# Patient Record
Sex: Male | Born: 1940 | Race: White | Hispanic: No | Marital: Married | Smoking: Former smoker
Health system: Southern US, Community
[De-identification: ages and names within clinical notes are randomized; demographics above are authoritative.]

## PROBLEM LIST (undated history)

## (undated) DIAGNOSIS — E039 Hypothyroidism, unspecified: Secondary | ICD-10-CM

## (undated) DIAGNOSIS — Z9289 Personal history of other medical treatment: Secondary | ICD-10-CM

## (undated) DIAGNOSIS — Z7189 Other specified counseling: Secondary | ICD-10-CM

## (undated) DIAGNOSIS — R0600 Dyspnea, unspecified: Secondary | ICD-10-CM

## (undated) DIAGNOSIS — I10 Essential (primary) hypertension: Secondary | ICD-10-CM

## (undated) DIAGNOSIS — E1140 Type 2 diabetes mellitus with diabetic neuropathy, unspecified: Secondary | ICD-10-CM

## (undated) DIAGNOSIS — G629 Polyneuropathy, unspecified: Secondary | ICD-10-CM

## (undated) DIAGNOSIS — C801 Malignant (primary) neoplasm, unspecified: Secondary | ICD-10-CM

## (undated) DIAGNOSIS — I739 Peripheral vascular disease, unspecified: Secondary | ICD-10-CM

## (undated) DIAGNOSIS — M1990 Unspecified osteoarthritis, unspecified site: Secondary | ICD-10-CM

## (undated) DIAGNOSIS — I878 Other specified disorders of veins: Secondary | ICD-10-CM

## (undated) HISTORY — PX: FINGER SURGERY: SHX640

## (undated) HISTORY — DX: Essential (primary) hypertension: I10

## (undated) HISTORY — DX: Personal history of other medical treatment: Z92.89

---
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
## 2008-02-03 ENCOUNTER — Emergency Department: Payer: Self-pay | Admitting: Emergency Medicine

## 2009-03-08 ENCOUNTER — Emergency Department: Payer: Self-pay | Admitting: Emergency Medicine

## 2009-04-10 DIAGNOSIS — Z9289 Personal history of other medical treatment: Secondary | ICD-10-CM

## 2009-04-10 HISTORY — DX: Personal history of other medical treatment: Z92.89

## 2009-05-08 ENCOUNTER — Ambulatory Visit: Payer: Self-pay | Admitting: Cardiovascular Disease

## 2011-05-11 ENCOUNTER — Inpatient Hospital Stay (HOSPITAL_COMMUNITY)
Admission: EM | Admit: 2011-05-11 | Discharge: 2011-05-14 | DRG: 200 | Disposition: A | Discharge status: Home / Self Care | Payer: Medicare PPO | Attending: General Surgery | Admitting: General Surgery

## 2011-05-11 ENCOUNTER — Emergency Department (HOSPITAL_COMMUNITY): Payer: Medicare PPO

## 2011-05-11 DIAGNOSIS — S21209A Unspecified open wound of unspecified back wall of thorax without penetration into thoracic cavity, initial encounter: Secondary | ICD-10-CM | POA: Diagnosis present

## 2011-05-11 DIAGNOSIS — Y92009 Unspecified place in unspecified non-institutional (private) residence as the place of occurrence of the external cause: Secondary | ICD-10-CM

## 2011-05-11 DIAGNOSIS — Z7982 Long term (current) use of aspirin: Secondary | ICD-10-CM

## 2011-05-11 DIAGNOSIS — F1010 Alcohol abuse, uncomplicated: Secondary | ICD-10-CM | POA: Diagnosis present

## 2011-05-11 DIAGNOSIS — Z79899 Other long term (current) drug therapy: Secondary | ICD-10-CM

## 2011-05-11 DIAGNOSIS — R339 Retention of urine, unspecified: Secondary | ICD-10-CM | POA: Diagnosis present

## 2011-05-11 DIAGNOSIS — F17200 Nicotine dependence, unspecified, uncomplicated: Secondary | ICD-10-CM | POA: Diagnosis present

## 2011-05-11 DIAGNOSIS — D62 Acute posthemorrhagic anemia: Secondary | ICD-10-CM | POA: Diagnosis not present

## 2011-05-11 DIAGNOSIS — E119 Type 2 diabetes mellitus without complications: Secondary | ICD-10-CM | POA: Diagnosis present

## 2011-05-11 DIAGNOSIS — S2190XA Unspecified open wound of unspecified part of thorax, initial encounter: Principal | ICD-10-CM | POA: Diagnosis present

## 2011-05-11 DIAGNOSIS — S270XXA Traumatic pneumothorax, initial encounter: Secondary | ICD-10-CM

## 2011-05-11 DIAGNOSIS — I10 Essential (primary) hypertension: Secondary | ICD-10-CM | POA: Diagnosis present

## 2011-05-11 DIAGNOSIS — E785 Hyperlipidemia, unspecified: Secondary | ICD-10-CM | POA: Diagnosis present

## 2011-05-11 LAB — CBC
HCT: 33.9 % — ABNORMAL LOW (ref 39.0–52.0)
MCH: 35.1 pg — ABNORMAL HIGH (ref 26.0–34.0)
MCHC: 36 g/dL (ref 30.0–36.0)
MCV: 97.4 fL (ref 78.0–100.0)
Platelets: 163 10*3/uL (ref 150–400)
RDW: 12.2 % (ref 11.5–15.5)

## 2011-05-11 LAB — POCT I-STAT, CHEM 8
BUN: 6 mg/dL (ref 6–23)
Calcium, Ion: 1.07 mmol/L — ABNORMAL LOW (ref 1.12–1.32)
Chloride: 101 meq/L (ref 96–112)
Glucose, Bld: 162 mg/dL — ABNORMAL HIGH (ref 70–99)
TCO2: 22 mmol/L (ref 0–100)

## 2011-05-11 LAB — GLUCOSE, CAPILLARY

## 2011-05-11 LAB — COMPREHENSIVE METABOLIC PANEL
ALT: 61 U/L — ABNORMAL HIGH (ref 0–53)
Alkaline Phosphatase: 63 U/L (ref 39–117)
CO2: 24 mEq/L (ref 19–32)
Chloride: 103 mEq/L (ref 96–112)
GFR calc Af Amer: >60 mL/min (ref >60)
Glucose, Bld: 161 mg/dL — ABNORMAL HIGH (ref 70–99)
Potassium: 3.7 mEq/L (ref 3.5–5.1)
Sodium: 137 mEq/L (ref 135–145)
Total Bilirubin: 0.2 mg/dL — ABNORMAL LOW (ref 0.3–1.2)
Total Protein: 5.8 g/dL — ABNORMAL LOW (ref 6.0–8.3)

## 2011-05-11 MED ORDER — IOHEXOL 300 MG/ML  SOLN
100.0000 mL | Freq: Once | INTRAMUSCULAR | Status: AC | PRN
  Administered 2011-05-11: 100 mL via INTRAVENOUS

## 2011-05-12 ENCOUNTER — Inpatient Hospital Stay (HOSPITAL_COMMUNITY): Payer: Medicare PPO

## 2011-05-12 LAB — BASIC METABOLIC PANEL
GFR calc Af Amer: >60 mL/min (ref >60)
GFR calc non Af Amer: >60 mL/min (ref >60)
Glucose, Bld: 153 mg/dL — ABNORMAL HIGH (ref 70–99)
Potassium: 4.3 mEq/L (ref 3.5–5.1)
Sodium: 134 mEq/L — ABNORMAL LOW (ref 135–145)

## 2011-05-12 LAB — CBC
Hemoglobin: 9.7 g/dL — ABNORMAL LOW (ref 13.0–17.0)
MCHC: 36.3 g/dL — ABNORMAL HIGH (ref 30.0–36.0)
RDW: 12.2 % (ref 11.5–15.5)
WBC: 5.7 10*3/uL (ref 4.0–10.5)

## 2011-05-12 LAB — GLUCOSE, CAPILLARY
Glucose-Capillary: 135 mg/dL — ABNORMAL HIGH (ref 70–99)
Glucose-Capillary: 195 mg/dL — ABNORMAL HIGH (ref 70–99)
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
## 2011-05-12 NOTE — Op Note (Signed)
  NAME:  AVREY, HYSER NO.:  [id number]  MEDICAL RECORD NO.:  [id number]  LOCATION:  3103                         FACILITY:  [facility]  PHYSICIAN:  Adolph Pollack, M.D.DATE OF BIRTH:  December 21, 1940  DATE OF PROCEDURE:  05/11/2011 DATE OF DISCHARGE:                              OPERATIVE REPORT   PREOPERATIVE DIAGNOSES: 1. Left hemopneumothorax. 2. 2.5-cm left back laceration.  POSTOPERATIVE DIAGNOSIS: 1. Left hemopneumothorax. 2. 2.5-cm left back laceration.  PROCEDURE: 1. Insertion of 32-French chest tube on the left side. 2. Closure of 2.5 cm back laceration.  SURGEON:  Adolph Pollack, MD.  ANESTHESIA:  Xylocaine local plus sedation.  TECHNIQUE:  The left chest area was sterilely prepped and draped.  The anterior axillary line inferior to the nipple.  Local anesthetic was infiltrated and a small incision was made.  Using the hemostat, blunt dissection performed in the subcutaneous tissues.  Using a Kelly clamp, I entered the sixth and seventh intercostal space over the rib and large amount of air was evacuated.  A 32-French chest tube was then placed at 10 cm and then anchored to the skin with 3-0 silk suture.  Vaseline gauze was wrapped around the wound and sterile dressing was applied.  It was hooked to suction with an air leak present.  He tolerated this procedure well.  Following this, the patient sat up in the bed and sterilely prepped out the area around the back laceration.  It was anesthetized with Xylocaine solution.  It was then closed with staples followed by a bulky dressing. Both these procedures were well tolerated without any apparent complications.     Adolph Pollack, M.D.     Kari Baars  D:  05/11/2011  T:  05/11/2011  Job:  [id number]  Electronically Signed by Avel Peace M.D. on 05/12/2011 05:21:04 PM

## 2011-05-13 ENCOUNTER — Inpatient Hospital Stay (HOSPITAL_COMMUNITY): Payer: Medicare PPO

## 2011-05-13 LAB — GLUCOSE, CAPILLARY
Glucose-Capillary: 156 mg/dL — ABNORMAL HIGH (ref 70–99)
Glucose-Capillary: 123 mg/dL — ABNORMAL HIGH (ref 70–99)

## 2011-05-13 LAB — CBC
Hemoglobin: 8.8 g/dL — ABNORMAL LOW (ref 13.0–17.0)
MCH: 35.6 pg — ABNORMAL HIGH (ref 26.0–34.0)
MCV: 100 fL (ref 78.0–100.0)
RBC: 2.47 MIL/uL — ABNORMAL LOW (ref 4.22–5.81)

## 2011-05-14 ENCOUNTER — Inpatient Hospital Stay (HOSPITAL_COMMUNITY): Payer: Medicare PPO

## 2011-05-14 LAB — GLUCOSE, CAPILLARY
Glucose-Capillary: 121 mg/dL — ABNORMAL HIGH (ref 70–99)
Glucose-Capillary: 131 mg/dL — ABNORMAL HIGH (ref 70–99)

## 2011-05-15 LAB — TYPE AND SCREEN
Antibody Screen: NEGATIVE
Unit division: 0
Unit division: 0
Unit division: 0
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
## 2011-05-20 ENCOUNTER — Encounter (INDEPENDENT_AMBULATORY_CARE_PROVIDER_SITE_OTHER): Payer: Self-pay

## 2011-05-20 ENCOUNTER — Ambulatory Visit (INDEPENDENT_AMBULATORY_CARE_PROVIDER_SITE_OTHER): Payer: Medicare PPO | Admitting: Orthopedic Surgery

## 2011-05-20 DIAGNOSIS — S21219A Laceration without foreign body of unspecified back wall of thorax without penetration into thoracic cavity, initial encounter: Secondary | ICD-10-CM

## 2011-05-20 DIAGNOSIS — S21209A Unspecified open wound of unspecified back wall of thorax without penetration into thoracic cavity, initial encounter: Secondary | ICD-10-CM

## 2011-05-20 DIAGNOSIS — S272XXA Traumatic hemopneumothorax, initial encounter: Secondary | ICD-10-CM

## 2011-05-20 MED ORDER — HYDROCODONE-ACETAMINOPHEN 5-325 MG PO TABS: 1.0000 | ORAL_TABLET | ORAL | Status: DC | PRN
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
## 2011-05-20 NOTE — Patient Instructions (Signed)
Wash wounds daily with soap and water in shower. Cover with dry dressing.

## 2013-01-12 ENCOUNTER — Emergency Department: Payer: Self-pay | Admitting: Emergency Medicine

## 2013-01-12 ENCOUNTER — Observation Stay: Payer: Self-pay | Admitting: Internal Medicine

## 2013-01-12 LAB — CBC
HGB: 12.7 g/dL — ABNORMAL LOW (ref 13.0–18.0)
MCH: 35.6 pg — ABNORMAL HIGH (ref 26.0–34.0)
RBC: 3.55 10*6/uL — ABNORMAL LOW (ref 4.40–5.90)
RDW: 13 % (ref 11.5–14.5)
WBC: 6.3 10*3/uL (ref 3.8–10.6)

## 2013-01-12 LAB — COMPREHENSIVE METABOLIC PANEL
Albumin: 3.7 g/dL (ref 3.4–5.0)
Chloride: 104 mmol/L (ref 98–107)
Creatinine: 0.75 mg/dL (ref 0.60–1.30)
EGFR (African American): >60
Glucose: 125 mg/dL — ABNORMAL HIGH (ref 65–99)
Potassium: 3.5 mmol/L (ref 3.5–5.1)
SGPT (ALT): 33 U/L (ref 12–78)
Total Protein: 6.3 g/dL — ABNORMAL LOW (ref 6.4–8.2)

## 2013-01-12 LAB — CK TOTAL AND CKMB (NOT AT ARMC)
CK, Total: 646 U/L — ABNORMAL HIGH (ref 35–232)
CK, Total: 443 U/L — ABNORMAL HIGH (ref 35–232)
CK-MB: 10.2 ng/mL — ABNORMAL HIGH (ref 0.5–3.6)
CK-MB: 6.4 ng/mL — ABNORMAL HIGH (ref 0.5–3.6)

## 2013-01-12 LAB — TROPONIN I: Troponin-I: <0.02 ng/mL

## 2013-01-13 DIAGNOSIS — I200 Unstable angina: Secondary | ICD-10-CM

## 2013-01-13 LAB — CK TOTAL AND CKMB (NOT AT ARMC)
CK, Total: 325 U/L — ABNORMAL HIGH (ref 35–232)
CK-MB: 4.1 ng/mL — ABNORMAL HIGH (ref 0.5–3.6)

## 2013-01-13 LAB — LIPID PANEL
HDL Cholesterol: 29 mg/dL — ABNORMAL LOW (ref 40–60)
Triglycerides: 212 mg/dL — ABNORMAL HIGH (ref 0–200)

## 2013-01-13 LAB — CBC WITH DIFFERENTIAL/PLATELET
Basophil #: 0 10*3/uL (ref 0.0–0.1)
Basophil %: 0.5 %
Eosinophil #: 0 10*3/uL (ref 0.0–0.7)
Eosinophil %: 0.5 %
Lymphocyte #: 1.1 10*3/uL (ref 1.0–3.6)
MCHC: 35.1 g/dL (ref 32.0–36.0)
Neutrophil #: 2 10*3/uL (ref 1.4–6.5)
Platelet: 139 10*3/uL — ABNORMAL LOW (ref 150–440)
WBC: 3.5 10*3/uL — ABNORMAL LOW (ref 3.8–10.6)

## 2013-01-13 LAB — BASIC METABOLIC PANEL
Anion Gap: 6 — ABNORMAL LOW (ref 7–16)
BUN: 7 mg/dL (ref 7–18)
Co2: 29 mmol/L (ref 21–32)
EGFR (African American): >60
Osmolality: 280 (ref 275–301)
Potassium: 3.8 mmol/L (ref 3.5–5.1)
Sodium: 141 mmol/L (ref 136–145)

## 2013-01-15 LAB — STOOL CULTURE

## 2014-05-01 ENCOUNTER — Emergency Department: Payer: Self-pay | Admitting: Emergency Medicine

## 2014-05-03 ENCOUNTER — Emergency Department: Payer: Self-pay | Admitting: Emergency Medicine

## 2014-05-06 LAB — WOUND CULTURE

## 2015-01-31 NOTE — H&P (Signed)
PATIENT NAME:  YEE, JOSS MR#:  [id number] DATE OF BIRTH:  Jan 17, 1941  DATE OF ADMISSION:  01/12/2013  PRIMARY CARE PHYSICIAN: None.  REFERRING EMERGENCY ROOM PHYSICIAN: Orlie Dakin, MD  CHIEF COMPLAINT: Chest pain and abdominal pain.   HISTORY OF PRESENT ILLNESS: This is a 74 year old male with not known past medical history and not following with any doctor due to insurance issues. Not taking any medication at home. Started having some mild abdominal pain and diarrhea for the last 3 to 4 days. He said that the stool is watery and almost 5 to 6 times a day. No nausea or vomiting. Able to tolerate his diet still. He also has some acid reflux problem with this. Now for the last 1 to 2 days, he started having complaint of retrosternal chest pain, which is pressure-like and dull, on and off and not correlated with activities. He is denying any feeling of palpitations, dizziness, shortness of breath, cough or fever.  REVIEW OF SYSTEMS:  CONSTITUTIONAL: No fever, fatigue, weakness, pain or weight loss.  EYES: No blurring, double vision.  ENT: No tinnitus, hearing loss or ear pain.  RESPIRATORY: No cough, wheezing, hemoptysis.  CARDIOVASCULAR: No chest pain, orthopnea, edema or arrhythmia.  GASTROINTESTINAL: Has some diarrhea and mild abdominal pain.  GENITOURINARY: Denies any dysuria, hematuria or increased frequency.  ENDOCRINE: Denies heat or cold intolerance or increased urination.  MUSCULOSKELETAL: No pain or swelling in the joints.  NEUROLOGICAL: Denies any numbness, weakness, tremors or headache.  PSYCHIATRIC: Denies any anxiety, insomnia or depression.   PAST MEDICAL HISTORY: Not known, but as per the medical records available in the system, in 2010 cardiac cath was done and it was positive for mild triple-vessel disease. Advised medical management. Was not taking any medicine.   PAST SURGICAL HISTORY: None.   HOME MEDICATIONS: None.   SOCIAL HISTORY: He stopped smoking 1 month  ago. He was smoking for almost 50 to 60 years 2 to 3 packs every week. Social drinking habit and no drug use. He was a Retail buyer and retired 9 years ago.   FAMILY HISTORY: Mother had myocardial infarction at old age.   PHYSICAL EXAMINATION:  VITAL SIGNS: Temperature 97.8, pulse rate 69, respiratory 18, blood pressure 173/71, pulse ox 98 on room air.  GENERAL: He is fully alert and oriented to time, place and person.  HEENT: Head and neck atraumatic. Conjunctivae pink. Oral mucosa moist.  NECK: Supple. No JVD.  RESPIRATORY: Bilateral clear and equal air entry.  CARDIOVASCULAR: S1, S2 present, regular. No murmur.  ABDOMEN: Soft, mild tenderness present all over, bowel sounds normal  SKIN: No rashes.  EXTREMITIES: Legs: No edema.  NEUROLOGICAL: Power 5/5. Moves all 4 limbs. No tremor. Follows commands.  PSYCHIATRIC: Does not appear in any acute psychiatric illness at this time.   LABORATORY AND DIAGNOSTIC DATA: Glucose 125, BUN 6, creatinine 0.75, sodium 137, potassium 3.5, chloride 104, CO2 of 28, calcium 7.7, total protein 6.3, albumin 3.7, alkaline phosphate 89, SGOT 36, SGPT 33. CK total 646, CK-MB 10.2 and troponin less than 0.02. WBC 6.3, hemoglobin 12.7, platelet count 161, MCV 102. EKG: Some conduction delay.   ASSESSMENT AND PLAN: A 74 year old male with past cardiac catheterization showing mild coronary artery disease, triple-vessel, not following with any doctor, not taking any medicines for anything. Came with mild diarrhea and abdominal pain for the last 3 to 4 days, but started having chest pain for 1 to 2 days on and off.  1.  Chest pain: As  he has a history of coronary artery disease and he was noncompliant with medications, we will do troponins x 3 and do echocardiogram and will start him on aspirin for now. Monitor on telemetry.  2.  Uncontrolled hypertension: We will give him metoprolol and continue monitoring.  3.  Coronary artery disease as per the chart in the past: We will  give aspirin. Troponins x 3, echocardiogram, and will check his lipid panel.  4.  Diarrhea and mild abdominal pain: Stool for culture. WBC is normal. No fever. Possibly this is viral gastroenteritis. No IV fluids due to hypertension. Will give supportive care with Zofran and Nexium.   TOTAL CARE TIME SPENT: 50 minutes.   CODE STATUS: Full code.   ____________________________ Ceasar Lund Anselm Jungling, MD vgv:jm D: 01/12/2013 15:09:03 ET T: 01/12/2013 15:21:39 ET JOB#: [id number]  cc: Ceasar Lund. Anselm Jungling, MD, <Dictator> Vaughan Basta MD ELECTRONICALLY SIGNED 01/21/2013 22:12

## 2015-01-31 NOTE — Discharge Summary (Signed)
PATIENT NAME:  [name], [name] MR#:  [id number] DATE OF BIRTH:  1940-12-04  DATE OF ADMISSION:  01/12/2013 DATE OF DISCHARGE:  01/13/2013  ADMISSION DIAGNOSES: 1.  Chest pain.  2.  Elevated blood pressure.   DISCHARGE DIAGNOSES: 1.  Chest pain.  2.  Elevated blood pressure; no formal diagnosis of hypertension.  3.  Diarrhea.   CONSULTS: None.   PERTINENT IMAGING: The patient had a 2-D echocardiogram which showed an EF of 55% to 60%; unable to exclude wall motion abnormality, impaired relaxation pattern of LV diastolic filling.   Troponins were negative. White blood cells 3.5, hemoglobin 12.6, hematocrit 36, platelets are 139. Sodium 141, potassium 3.8, chloride 106, bicarbonate 29, BUN 7, creatinine 0.87, glucose 106, magnesium 2.0, LDL 36, VLDL 42, HDL 29, triglycerides 212, cholesterol 107.   HOSPITAL COURSE: This is a 74 year old male who presented with chest pain and elevated blood pressure. For further details, please refer to the H and P.   1.  Chest pain: The patient was admitted to telemetry. He was ruled out with cardiac enzymes. He had no chest pain while in the hospital. He had an echocardiogram which was performed, which showed results above. His troponins, as mentioned, were negative. If the patient has chest pain again he was told to come to the ER. At that time he will need further evaluation. At this time I feel that his chest pain may be related to his elevated blood pressure.  2.  Elevated blood pressure: No formal diagnosis of hypertension. He had improved with metoprolol that he was started on here in the hospital. He will need a followup for his blood pressure. His echocardiogram did show some impaired LV relaxation which probably goes along with his elevated blood pressure.  3.  Diarrhea, which seemed to have improved.   DISCHARGE MEDICATIONS: 1.  Aspirin 81 mg daily.  2.  Metoprolol 25 b.i.d.   DISCHARGE DIET: Low sodium.   DISCHARGE ACTIVITY: As tolerated.    The patient's sister will help the patient find a primary care physician to continue his medical care and followup on blood pressure.   TIME SPENT: Approximately 35 minutes.    ____________________________ Donell Beers. Benjie Karvonen, MD spm:dm D: 01/13/2013 15:30:00 ET T: 01/14/2013 08:58:42 ET JOB#: [id number]  cc: Ural Acree P. Benjie Karvonen, MD, <Dictator> Donell Beers Lyzette Reinhardt MD ELECTRONICALLY SIGNED 01/14/2013 13:26

## 2015-03-03 DIAGNOSIS — E119 Type 2 diabetes mellitus without complications: Secondary | ICD-10-CM | POA: Diagnosis not present

## 2015-03-03 DIAGNOSIS — I10 Essential (primary) hypertension: Secondary | ICD-10-CM | POA: Diagnosis not present

## 2015-03-03 DIAGNOSIS — R079 Chest pain, unspecified: Secondary | ICD-10-CM | POA: Diagnosis not present

## 2015-03-03 DIAGNOSIS — Z125 Encounter for screening for malignant neoplasm of prostate: Secondary | ICD-10-CM | POA: Diagnosis not present

## 2015-03-03 DIAGNOSIS — E785 Hyperlipidemia, unspecified: Secondary | ICD-10-CM | POA: Diagnosis not present

## 2015-03-03 LAB — BASIC METABOLIC PANEL
BUN: 8 mg/dL (ref 4–21)
Creatinine: 0.8 mg/dL (ref 0.6–1.3)
GLUCOSE: 124 mg/dL
Potassium: 4.6 mmol/L (ref 3.4–5.3)
SODIUM: 135 mmol/L — AB (ref 137–147)

## 2015-03-03 LAB — CBC AND DIFFERENTIAL
HCT: 36 % — AB (ref 41–53)
Hemoglobin: 12.3 g/dL — AB (ref 13.5–17.5)
Platelets: 145 10*3/uL — AB (ref 150–399)
WBC: 3 10*3/mL

## 2015-03-03 LAB — HM DIABETES FOOT EXAM: HM Diabetic Foot Exam: NORMAL

## 2015-03-03 LAB — HEPATIC FUNCTION PANEL
ALK PHOS: 80 U/L (ref 25–125)
ALT: 68 U/L — AB (ref 10–40)
AST: 49 U/L — AB (ref 14–40)
Bilirubin, Total: 0.4 mg/dL

## 2015-03-03 LAB — LIPID PANEL
Cholesterol: 158 mg/dL (ref 0–200)
HDL: 28 mg/dL — AB (ref 35–70)
LDL CALC: 54 mg/dL
Triglycerides: 380 mg/dL — AB (ref 40–160)

## 2015-03-03 LAB — HEMOGLOBIN A1C: Hgb A1c MFr Bld: 7.4 % — AB (ref 4.0–6.0)

## 2015-03-03 LAB — PSA: PSA: 1.4

## 2015-03-03 LAB — TSH: TSH: 10.05 u[IU]/mL — AB (ref 0.41–5.90)

## 2015-04-07 ENCOUNTER — Ambulatory Visit (INDEPENDENT_AMBULATORY_CARE_PROVIDER_SITE_OTHER): Payer: Medicare Other | Admitting: Family Medicine

## 2015-04-07 ENCOUNTER — Encounter: Payer: Self-pay | Admitting: Family Medicine

## 2015-04-07 VITALS — BP 132/70 | HR 74 | Temp 98.1°F | Ht 67.5 in | Wt 189.8 lb

## 2015-04-07 DIAGNOSIS — I129 Hypertensive chronic kidney disease with stage 1 through stage 4 chronic kidney disease, or unspecified chronic kidney disease: Secondary | ICD-10-CM | POA: Diagnosis not present

## 2015-04-07 DIAGNOSIS — E1140 Type 2 diabetes mellitus with diabetic neuropathy, unspecified: Secondary | ICD-10-CM | POA: Diagnosis not present

## 2015-04-07 DIAGNOSIS — N181 Chronic kidney disease, stage 1: Secondary | ICD-10-CM | POA: Diagnosis not present

## 2015-04-07 DIAGNOSIS — D61818 Other pancytopenia: Secondary | ICD-10-CM

## 2015-04-07 DIAGNOSIS — R946 Abnormal results of thyroid function studies: Secondary | ICD-10-CM

## 2015-04-07 DIAGNOSIS — D72819 Decreased white blood cell count, unspecified: Secondary | ICD-10-CM

## 2015-04-07 DIAGNOSIS — IMO0002 Reserved for concepts with insufficient information to code with codable children: Secondary | ICD-10-CM

## 2015-04-07 DIAGNOSIS — E1122 Type 2 diabetes mellitus with diabetic chronic kidney disease: Secondary | ICD-10-CM | POA: Diagnosis not present

## 2015-04-07 DIAGNOSIS — R7989 Other specified abnormal findings of blood chemistry: Secondary | ICD-10-CM

## 2015-04-07 DIAGNOSIS — Z7689 Persons encountering health services in other specified circumstances: Secondary | ICD-10-CM | POA: Diagnosis not present

## 2015-04-07 DIAGNOSIS — E1165 Type 2 diabetes mellitus with hyperglycemia: Secondary | ICD-10-CM | POA: Diagnosis not present

## 2015-04-07 HISTORY — DX: Type 2 diabetes mellitus with diabetic neuropathy, unspecified: E11.40

## 2015-04-07 MED ORDER — METFORMIN HCL ER (MOD) 1000 MG PO TB24: 1000.0000 mg | ORAL_TABLET | Freq: Every day | ORAL | Status: DC

## 2015-04-07 MED ORDER — LISINOPRIL 5 MG PO TABS: 5.0000 mg | ORAL_TABLET | Freq: Every day | ORAL | Status: DC

## 2015-04-07 MED ORDER — LISINOPRIL 5 MG PO TABS
5.0000 mg | ORAL_TABLET | Freq: Every day | ORAL | Status: DC
Start: 2015-04-07 — End: 2015-04-07

## 2015-04-07 NOTE — Assessment & Plan Note (Signed)
Of unclear etiology. Had abdominal pain last visit, so possibly post-infectious. Rechecking today. If continues to be low, will refer to hematology.

## 2015-04-07 NOTE — Assessment & Plan Note (Signed)
Rechecking labs today. Await results.  

## 2015-04-07 NOTE — Assessment & Plan Note (Addendum)
Not under great control at this time, but better on recheck. Will start lisinopril 5mg  because of labile BP and CKD. Will recheck 1 month with BMP at that time.

## 2015-04-07 NOTE — Progress Notes (Signed)
BP 132/70 mmHg  Pulse 74  Temp(Src) 98.1 F (36.7 C) (Oral)  Ht 5' 7.5" (1.715 m)  Wt 189 lb 12.8 oz (86.093 kg)  BMI 29.27 kg/m2  SpO2 96%   Subjective:    Patient ID: [name], male    DOB: [birth year], 73 y.o.   MRN: [id number]  HPI: [name] is a 74 y.o. male who presents today for follow up on his diabetes, blood pressure and labwork.   Chief Complaint  Patient presents with  . Follow-up   HYPOTHYROIDISM- has never had a problem with his thyroid before. Has never been on thyroid medicine before. Does note several symptoms.  Thyroid control status:uncontrolled Satisfied with current treatment? no Medication side effects: no Fatigue: yes Cold intolerance: no Heat intolerance: no Weight gain: yes Weight loss: no Constipation: no Diarrhea/loose stools: yes Palpitations: no Lower extremity edema: no Anxiety/depressed mood: no   HYPERTENSION Hypertension status: stable Satisfied with current treatment? no Duration of hypertension: chronic BP monitoring frequency:  not checking Previous BP meds:lisinopril Aspirin: no Recurrent headaches: no Visual changes: no Palpitations: no Dyspnea: no Chest pain: no Lower extremity edema: no Dizzy/lightheaded: no   DIABETES Hypoglycemic episodes:no Polydipsia/polyuria: no Visual disturbance: no Chest pain: no Paresthesias: no Glucose Monitoring: no Blood Pressure Monitoring: not checking Retinal Examination: Not up to Date Foot Exam: Up to Date Diabetic Education: Not Completed Pneumovax: Up to Date Influenza: unknown Aspirin: no   Low WBC- has never been diagnosed with this before. Feeling fine.   His abdominal pain has resolved. He is otherwise feeling well. He missed his appointment for podiatry and still notes that his toenails are bothering him a lot. He otherwise has no other concerns or complaints at this time.   Relevant past medical, surgical, family and social history reviewed and updated as  indicated. Interim medical history since our last visit reviewed. Allergies and medications reviewed and updated.  Review of Systems  Constitutional: Negative.   Respiratory: Negative.   Cardiovascular: Negative.   Gastrointestinal: Negative.   Psychiatric/Behavioral: Negative.    Per HPI unless specifically indicated above    Objective:    BP 132/70 mmHg  Pulse 74  Temp(Src) 98.1 F (36.7 C) (Oral)  Ht 5' 7.5" (1.715 m)  Wt 189 lb 12.8 oz (86.093 kg)  BMI 29.27 kg/m2  SpO2 96%  Wt Readings from Last 3 Encounters:  04/07/15 189 lb 12.8 oz (86.093 kg)  03/03/15 190 lb (86.183 kg)    Physical Exam  Constitutional: He is oriented to person, place, and time. He appears well-developed and well-nourished. No distress.  HENT:  Head: Normocephalic and atraumatic.  Right Ear: Hearing normal.  Left Ear: Hearing normal.  Nose: Nose normal.  Eyes: Conjunctivae and lids are normal. Right eye exhibits no discharge. Left eye exhibits no discharge. No scleral icterus.  Cardiovascular: Normal rate, regular rhythm, normal heart sounds and intact distal pulses.  Exam reveals no gallop and no friction rub.   No murmur heard. Pulmonary/Chest: Effort normal and breath sounds normal. No respiratory distress.  Musculoskeletal: Normal range of motion.  Neurological: He is alert and oriented to person, place, and time.  Skin: Skin is warm, dry and intact. No rash noted.  Psychiatric: He has a normal mood and affect. His speech is normal and behavior is normal. Judgment and thought content normal. Cognition and memory are normal.  DM Foot Exam Color: normal, overgrown toenails Sensation Monofilament:diminished right and left heel and ball, everything except toes Circulation:  Pulses normal right and left pedal and posterial tibial  Lesions:  None     Assessment & Plan:   Problem List Items Addressed This Visit      Endocrine   Type 2 diabetes, uncontrolled, with neuropathy    A1c 7.4 at  last check. Will restart his metformin and recheck in 3 months. Continue diet and exercise. Education about neuropathy and checking his feet given today. No pain, so no need for neuroleptic at this time. Appointment for podiatry remade and referral for opthalmology written today.       Relevant Medications   lisinopril (PRINIVIL,ZESTRIL) 5 MG tablet   metFORMIN (GLUMETZA) 1000 MG (MOD) 24 hr tablet   Other Relevant Orders   Ambulatory referral to Ophthalmology     Genitourinary   Benign hypertensive renal disease - Primary    Not under great control at this time, but better on recheck. Will start lisinopril 5mg  because of labile BP and CKD. Will recheck 1 month with BMP at that time.       Relevant Medications   lisinopril (PRINIVIL,ZESTRIL) 5 MG tablet   CKD stage 1 due to type 2 diabetes mellitus    Started on lisinopril today. Continue to monitor.       Relevant Medications   lisinopril (PRINIVIL,ZESTRIL) 5 MG tablet   metFORMIN (GLUMETZA) 1000 MG (MOD) 24 hr tablet     Hematopoietic and Hemostatic   Pancytopenia    Rechecking labs today. Await results.         Other   Leukopenia    Of unclear etiology. Had abdominal pain last visit, so possibly post-infectious. Rechecking today. If continues to be low, will refer to hematology.       Relevant Orders   CBC With Differential/Platelet   Elevated TSH    Elevated last visit. Will recheck today, if still elevated, we will start him on levothyroxine and recheck in 6 weeks.       Relevant Orders   TSH       Follow up plan: Return in about 4 weeks (around 05/05/2015).

## 2015-04-07 NOTE — Assessment & Plan Note (Signed)
Elevated last visit. Will recheck today, if still elevated, we will start him on levothyroxine and recheck in 6 weeks.

## 2015-04-07 NOTE — Assessment & Plan Note (Signed)
A1c 7.4 at last check. Will restart his metformin and recheck in 3 months. Continue diet and exercise. Education about neuropathy and checking his feet given today. No pain, so no need for neuroleptic at this time. Appointment for podiatry remade and referral for opthalmology written today.

## 2015-04-07 NOTE — Assessment & Plan Note (Signed)
Started on lisinopril today. Continue to monitor.

## 2015-04-07 NOTE — Patient Instructions (Addendum)
You have an appointment with  [facility]  [place], [place] [address] [phone]  on 04/22/15 at [facility]. Be there at 1:45PM   Diabetic Neuropathy Diabetic neuropathy is a nerve disease or nerve damage that is caused by diabetes mellitus. About half of all people with diabetes mellitus have some form of nerve damage. Nerve damage is more common in those who have had diabetes mellitus for many years and who generally have not had good control of their blood sugar (glucose) level. Diabetic neuropathy is a common complication of diabetes mellitus. There are three more common types of diabetic neuropathy and a fourth type that is less common and less understood:   Peripheral neuropathy--This is the most common type of diabetic neuropathy. It causes damage to the nerves of the feet and legs first and then eventually the hands and arms.The damage affects the ability to sense touch.  Autonomic neuropathy--This type causes damage to the autonomic nervous system, which controls the following functions:  Heartbeat.  Body temperature.  Blood pressure.  Urination.  Digestion.  Sweating.  Sexual function.  Focal neuropathy--Focal neuropathy can be painful and unpredictable and occurs most often in older adults with diabetes mellitus. It involves a specific nerve or one area and often comes on suddenly. It usually does not cause long-term problems.  Radiculoplexus neuropathy-- Sometimes called lumbosacral radiculoplexus neuropathy, radiculoplexus neuropathy affects the nerves of the thighs, hips, buttocks, or legs. It is more common in people with type 2 diabetes mellitus and in older men. It is characterized by debilitating pain, weakness, and atrophy, usually in the thigh muscles. CAUSES  The cause of peripheral, autonomic, and focal neuropathies is diabetes mellitus that is uncontrolled and high glucose levels. The cause of radiculoplexus neuropathy is unknown.  However, it is thought to be caused by inflammation related to uncontrolled glucose levels. SIGNS AND SYMPTOMS  Peripheral Neuropathy Peripheral neuropathy develops slowly over time. When the nerves of the feet and legs no longer work there may be:   Burning, stabbing, or aching pain in the legs or feet.  Inability to feel pressure or pain in your feet. This can lead to:  Thick calluses over pressure areas.  Pressure sores.  Ulcers.  Foot deformities.  Reduced ability to feel temperature changes.  Muscle weakness. Autonomic Neuropathy The symptoms of autonomic neuropathy vary depending on which nerves are affected. Symptoms may include:  Problems with digestion, such as:  Feeling sick to your stomach (nausea).  Vomiting.  Bloating.  Constipation.  Diarrhea.  Abdominal pain.  Difficulty with urination. This occurs if you lose your ability to sense when your bladder is full. Problems include:  Urine leakage (incontinence).  Inability to empty your bladder completely (retention).  Rapid or irregular heartbeat (palpitations).  Blood pressure drops when you stand up (orthostatic hypotension). When you stand up you may feel:  Dizzy.  Weak.  Faint.  In men, inability to attain and maintain an erection.  In women, vaginal dryness and problems with decreased sexual desire and arousal.  Problems with body temperature regulation.  Increased or decreased sweating. Focal Neuropathy  Abnormal eye movements or abnormal alignment of both eyes.  Weakness in the wrist.  Foot drop. This results in an inability to lift the foot properly and abnormal walking or foot movement.  Paralysis on one side of your face (Bell palsy).  Chest or abdominal pain. Radiculoplexus Neuropathy  Sudden, severe pain in your hip, thigh, or buttocks.  Weakness and wasting of  thigh muscles.  Difficulty rising from a seated position.  Abdominal swelling.  Unexplained weight loss  (usually more than 10 lb [4.5 kg]). DIAGNOSIS  Peripheral Neuropathy Your senses may be tested. Sensory function testing can be done with:  A light touch using a monofilament.  A vibration with tuning fork.  A sharp sensation with a pin prick. Other tests that can help diagnose neuropathy are:  Nerve conduction velocity. This test checks the transmission of an electrical current through a nerve.  Electromyography. This shows how muscles respond to electrical signals transmitted by nearby nerves.  Quantitative sensory testing. This is used to assess how your nerves respond to vibrations and changes in temperature. Autonomic Neuropathy Diagnosis is often based on reported symptoms. Tell your health care provider if you experience:   Dizziness.   Constipation.   Diarrhea.   Inappropriate urination or inability to urinate.   Inability to get or maintain an erection.  Tests that may be done include:   Electrocardiography or Holter monitor. These are tests that can help show problems with the heart rate or heart rhythm.   An X-ray exam may be done. Focal Neuropathy Diagnosis is made based on your symptoms and what your health care provider finds during your exam. Other tests may be done. They may include:  Nerve conduction velocities. This checks the transmission of electrical current through a nerve.  Electromyography. This shows how muscles respond to electrical signals transmitted by nearby nerves.  Quantitative sensory testing. This test is used to assess how your nerves respond to vibration and changes in temperature. Radiculoplexus Neuropathy  Often the first thing is to eliminate any other issue or problems that might be the cause, as there is no stick test for diagnosis.  X-ray exam of your spine and lumbar region.  Spinal tap to rule out cancer.  MRI to rule out other lesions. TREATMENT  Once nerve damage occurs, it cannot be reversed. The goal of  treatment is to keep the disease or nerve damage from getting worse and affecting more nerve fibers. Controlling your blood glucose level is the key. Most people with radiculoplexus neuropathy see at least a partial improvement over time. You will need to keep your blood glucose and HbA1c levels in the target range determined by your health care provider. Things that help control blood glucose levels include:   Blood glucose monitoring.   Meal planning.   Physical activity.   Diabetes medicine.  Over time, maintaining lower blood glucose levels helps lessen symptoms. Sometimes, prescription pain medicine is needed. HOME CARE INSTRUCTIONS:  Do not smoke.  Keep your blood glucose level in the range that you and your health care provider have determined acceptable for you.  Keep your blood pressure level in the range that you and your health care provider have determined acceptable for you.  Eat a well-balanced diet.  Be active every day.  Check your feet every day. SEEK MEDICAL CARE IF:   You have burning, stabbing, or aching pain in the legs or feet.  You are unable to feel pressure or pain in your feet.  You develop problems with digestion such as:  Nausea.  Vomiting.  Bloating.  Constipation.  Diarrhea.  Abdominal pain.  You have difficulty with urination, such as:  Incontinence.  Retention.  You have palpitations.  You develop orthostatic hypotension. When you stand up you may feel:  Dizzy.  Weak.  Faint.  You cannot attain and maintain an erection (in men).  You  have vaginal dryness and problems with decreased sexual desire and arousal (in women).  You have severe pain in your thighs, legs, or buttocks.  You have unexplained weight loss. Document Released: 12/06/2001 Document Revised: 07/18/2013 Document Reviewed: 03/08/2013 [facility] Patient Information 2015 [place], [place]. This information is not intended to replace advice given to you by  your health care provider. Make sure you discuss any questions you have with your health care provider. Diabetes and Foot Care Diabetes may cause you to have problems because of poor blood supply (circulation) to your feet and legs. This may cause the skin on your feet to become thinner, break easier, and heal more slowly. Your skin may become dry, and the skin may peel and crack. You may also have nerve damage in your legs and feet causing decreased feeling in them. You may not notice minor injuries to your feet that could lead to infections or more serious problems. Taking care of your feet is one of the most important things you can do for yourself.  HOME CARE INSTRUCTIONS  Wear shoes at all times, even in the house. Do not go barefoot. Bare feet are easily injured.  Check your feet daily for blisters, cuts, and redness. If you cannot see the bottom of your feet, use a mirror or ask someone for help.  Wash your feet with warm water (do not use hot water) and mild soap. Then pat your feet and the areas between your toes until they are completely dry. Do not soak your feet as this can dry your skin.  Apply a moisturizing lotion or petroleum jelly (that does not contain alcohol and is unscented) to the skin on your feet and to dry, brittle toenails. Do not apply lotion between your toes.  Trim your toenails straight across. Do not dig under them or around the cuticle. File the edges of your nails with an emery board or nail file.  Do not cut corns or calluses or try to remove them with medicine.  Wear clean socks or stockings every day. Make sure they are not too tight. Do not wear knee-high stockings since they may decrease blood flow to your legs.  Wear shoes that fit properly and have enough cushioning. To break in new shoes, wear them for just a few hours a day. This prevents you from injuring your feet. Always look in your shoes before you put them on to be sure there are no objects  inside.  Do not cross your legs. This may decrease the blood flow to your feet.  If you find a minor scrape, cut, or break in the skin on your feet, keep it and the skin around it clean and dry. These areas may be cleansed with mild soap and water. Do not cleanse the area with peroxide, alcohol, or iodine.  When you remove an adhesive bandage, be sure not to damage the skin around it.  If you have a wound, look at it several times a day to make sure it is healing.  Do not use heating pads or hot water bottles. They may burn your skin. If you have lost feeling in your feet or legs, you may not know it is happening until it is too late.  Make sure your health care provider performs a complete foot exam at least annually or more often if you have foot problems. Report any cuts, sores, or bruises to your health care provider immediately. SEEK MEDICAL CARE IF:   You have an  injury that is not healing.  You have cuts or breaks in the skin.  You have an ingrown nail.  You notice redness on your legs or feet.  You feel burning or tingling in your legs or feet.  You have pain or cramps in your legs and feet.  Your legs or feet are numb.  Your feet always feel cold. SEEK IMMEDIATE MEDICAL CARE IF:   There is increasing redness, swelling, or pain in or around a wound.  There is a red line that goes up your leg.  Pus is coming from a wound.  You develop a fever or as directed by your health care provider.  You notice a bad smell coming from an ulcer or wound. Document Released: 09/24/2000 Document Revised: 05/30/2013 Document Reviewed: 03/06/2013 [facility] Patient Information 2015 [place], [place]. This information is not intended to replace advice given to you by your health care provider. Make sure you discuss any questions you have with your health care provider. Diabetes and Sick Day Management Blood sugar (glucose) can be more difficult to control when you are sick. Colds, fever,  flu, nausea, vomiting, and diarrhea are all examples of common illnesses that can cause problems for people with diabetes. Loss of body fluids (dehydration) from fever, vomiting, diarrhea, infection, and the stress of a sickness can all cause blood glucose levels to increase. Because of this, it is very important to take your diabetes medicines and to eat some form of carbohydrate food when you are sick. Liquid or soft foods are often tolerated, and they help to replace fluids. HOME CARE INSTRUCTIONS These main guidelines are intended for managing a short-term (24 hours or less) sickness:  Take your usual dose of insulin or oral diabetes medicine. An exception would be if you take any form of metformin. If you cannot eat or drink, you can become dehydrated and should not take this medicine.  Continue to take your insulin even if you are unable to eat solid foods or are vomiting. Your insulin dose may stay the same, or it may need to be increased when you are sick.  You will need to test your blood glucose more often, generally every 2-4 hours. If you have type 1 diabetes, test your urine for ketones every 4 hours. If you have type 2 diabetes, test your urine for ketones as directed by your health care provider.  Eat some form of food that contains carbohydrates. The carbohydrates can be in solid or liquid form. You should eat 45-50 g of carbohydrates every 3-4 hours.  Replace fluids if you have a fever, vomit, or have diarrhea. Ask your health care provider for specific rehydration instructions.  Watch carefully for the signs of ketoacidosis if you have type 1 diabetes. Call your health care provider if any of the following symptoms are present, especially in children:  Moderate to large ketones in the urine along with a high blood glucose level.  Severe nausea.  Vomiting.  Diarrhea.  Abdominal pain.  Rapid breathing.  Drink extra liquids that do not contain sugar such as water.  Be  careful with over-the-counter medicines. Read the labels. They may contain sugar or types of sugars that can increase your blood glucose level. Food Choices for Illness All of the food choices below contain about 15 g of carbohydrates. Plan ahead and keep some of these foods around.    to  cup carbonated beverage containing sugar. Carbonated beverages will usually be better tolerated if they are opened and  left at room temperature for a few minutes.   of a twin frozen ice pop.   cup regular gelatin.   cup juice.   cup ice cream or frozen yogurt.   cup cooked cereal.   cup sherbet.  1 cup clear broth or soup.  1 cup cream soup.   cup regular custard.   cup regular pudding.  1 cup sports drink.  1 cup plain yogurt.  1 slice toast.  6 squares saltine crackers.  5 vanilla wafers. SEEK MEDICAL CARE IF:   You are unable to drink fluids, even small amounts.  You have nausea and vomiting for more than 6 hours.  You have diarrhea for more than 6 hours.  Your blood glucose level is more than 240 mg/dL, even with additional insulin.  There is a change in mental status.  You develop an additional serious sickness.  You have been sick for 2 days and are not getting better.  You have a fever. SEEK IMMEDIATE MEDICAL CARE IF:  You have difficulty breathing.  You have moderate to large ketone levels. MAKE SURE YOU:  Understand these instructions.  Will watch your condition.  Will get help right away if you are not doing well or get worse. Document Released: 09/30/2003 Document Revised: 02/11/2014 Document Reviewed: 03/06/2013 [facility] Patient Information 2015 [place], [place]. This information is not intended to replace advice given to you by your health care provider. Make sure you discuss any questions you have with your health care provider.

## 2015-04-08 ENCOUNTER — Telehealth: Payer: Self-pay | Admitting: Family Medicine

## 2015-04-08 DIAGNOSIS — D61818 Other pancytopenia: Secondary | ICD-10-CM

## 2015-04-08 DIAGNOSIS — E039 Hypothyroidism, unspecified: Secondary | ICD-10-CM

## 2015-04-08 LAB — CBC WITH DIFFERENTIAL/PLATELET
BASOS ABS: 0 10*3/uL (ref 0.0–0.2)
BASOS: 0 %
EOS (ABSOLUTE): 0 10*3/uL (ref 0.0–0.4)
Eos: 1 %
HEMATOCRIT: 37.2 % — AB (ref 37.5–51.0)
HEMOGLOBIN: 12.6 g/dL (ref 12.6–17.7)
Immature Grans (Abs): 0 10*3/uL (ref 0.0–0.1)
Immature Granulocytes: 0 %
LYMPHS: 24 %
Lymphocytes Absolute: 0.7 10*3/uL (ref 0.7–3.1)
MCH: 36.8 pg — ABNORMAL HIGH (ref 26.6–33.0)
MCHC: 33.9 g/dL (ref 31.5–35.7)
MCV: 109 fL — AB (ref 79–97)
MONOCYTES: 11 %
Monocytes Absolute: 0.3 10*3/uL (ref 0.1–0.9)
NEUTROS ABS: 1.7 10*3/uL (ref 1.4–7.0)
Neutrophils: 64 %
Platelets: 151 10*3/uL (ref 150–379)
RBC: 3.42 x10E6/uL — ABNORMAL LOW (ref 4.14–5.80)
RDW: 14.6 % (ref 12.3–15.4)
WBC: 2.8 10*3/uL — AB (ref 3.4–10.8)

## 2015-04-08 LAB — SPECIMEN STATUS REPORT

## 2015-04-08 MED ORDER — LEVOTHYROXINE SODIUM 25 MCG PO TABS: 25.0000 ug | ORAL_TABLET | Freq: Every day | ORAL | Status: DC

## 2015-04-08 NOTE — Telephone Encounter (Signed)
Called [name] to give him the results of his blood work. Thyroid still low, although questionable if this is clinically significant. Will treat with 25mg  of levothryoxine and recheck in 6 weeks. Still pancytopenic. Referral made to cancer center. He is aware. No questions at this time.

## 2015-04-18 LAB — TSH: TSH: 6.1 u[IU]/mL — AB (ref 0.450–4.500)

## 2015-04-22 ENCOUNTER — Inpatient Hospital Stay: Payer: Medicare Other | Attending: Oncology | Admitting: Oncology

## 2015-04-22 DIAGNOSIS — E1142 Type 2 diabetes mellitus with diabetic polyneuropathy: Secondary | ICD-10-CM | POA: Diagnosis not present

## 2015-04-22 DIAGNOSIS — B351 Tinea unguium: Secondary | ICD-10-CM | POA: Diagnosis not present

## 2015-04-28 ENCOUNTER — Ambulatory Visit (INDEPENDENT_AMBULATORY_CARE_PROVIDER_SITE_OTHER): Payer: Medicare Other | Admitting: Family Medicine

## 2015-04-28 ENCOUNTER — Encounter: Payer: Self-pay | Admitting: Family Medicine

## 2015-04-28 VITALS — BP 112/58 | HR 78 | Temp 97.6°F | Wt 189.6 lb

## 2015-04-28 DIAGNOSIS — I129 Hypertensive chronic kidney disease with stage 1 through stage 4 chronic kidney disease, or unspecified chronic kidney disease: Secondary | ICD-10-CM | POA: Diagnosis not present

## 2015-04-28 DIAGNOSIS — E039 Hypothyroidism, unspecified: Secondary | ICD-10-CM | POA: Diagnosis not present

## 2015-04-28 DIAGNOSIS — D61818 Other pancytopenia: Secondary | ICD-10-CM

## 2015-04-28 DIAGNOSIS — E1142 Type 2 diabetes mellitus with diabetic polyneuropathy: Secondary | ICD-10-CM

## 2015-04-28 NOTE — Assessment & Plan Note (Signed)
Missed appointment with his oncologist. Rescheduled today. Patient aware of the appointment.

## 2015-04-28 NOTE — Patient Instructions (Addendum)
(  [phone]: [facility]   July 28, 2:45PM- Blood Doctor [facility] [place]  [phone]   Anemia, Nonspecific Anemia is a condition in which the concentration of red blood cells or hemoglobin in the blood is below normal. Hemoglobin is a substance in red blood cells that carries oxygen to the tissues of the body. Anemia results in not enough oxygen reaching these tissues.  CAUSES  Common causes of anemia include:   Excessive bleeding. Bleeding may be internal or external. This includes excessive bleeding from periods (in women) or from the intestine.   Poor nutrition.   Chronic kidney, thyroid, and liver disease.  Bone marrow disorders that decrease red blood cell production.  Cancer and treatments for cancer.  HIV, AIDS, and their treatments.  Spleen problems that increase red blood cell destruction.  Blood disorders.  Excess destruction of red blood cells due to infection, medicines, and autoimmune disorders. SIGNS AND SYMPTOMS   Minor weakness.   Dizziness.   Headache.  Palpitations.   Shortness of breath, especially with exercise.   Paleness.  Cold sensitivity.  Indigestion.  Nausea.  Difficulty sleeping.  Difficulty concentrating. Symptoms may occur suddenly or they may develop slowly.  DIAGNOSIS  Additional blood tests are often needed. These help your health care provider determine the best treatment. Your health care provider will check your stool for blood and look for other causes of blood loss.  TREATMENT  Treatment varies depending on the cause of the anemia. Treatment can include:   Supplements of iron, vitamin H84, or folic acid.   Hormone medicines.   A blood transfusion. This may be needed if blood loss is severe.   Hospitalization. This may be needed if there is significant continual blood loss.   Dietary changes.  Spleen removal. HOME CARE INSTRUCTIONS Keep all follow-up appointments. It  often takes many weeks to correct anemia, and having your health care provider check on your condition and your response to treatment is very important. SEEK IMMEDIATE MEDICAL CARE IF:   You develop extreme weakness, shortness of breath, or chest pain.   You become dizzy or have trouble concentrating.  You develop heavy vaginal bleeding.   You develop a rash.   You have bloody or black, tarry stools.   You faint.   You vomit up blood.   You vomit repeatedly.   You have abdominal pain.  You have a fever or persistent symptoms for more than 2-3 days.   You have a fever and your symptoms suddenly get worse.   You are dehydrated.  MAKE SURE YOU:  Understand these instructions.  Will watch your condition.  Will get help right away if you are not doing well or get worse. Document Released: 11/04/2004 Document Revised: 05/30/2013 Document Reviewed: 03/23/2013 [facility] Patient Information 2015 [place], [place]. This information is not intended to replace advice given to you by your health care provider. Make sure you discuss any questions you have with your health care provider.

## 2015-04-28 NOTE — Assessment & Plan Note (Signed)
Well controlled on recheck today. Continue current regimen. BMP checked today. Await results.

## 2015-04-28 NOTE — Progress Notes (Signed)
BP 112/58 mmHg  Pulse 78  Temp(Src) 97.6 F (36.4 C)  Wt 189 lb 9.6 oz (86.002 kg)  SpO2 95%   Subjective:    Patient ID: [name], male    DOB: [birth year], 74 y.o.   MRN: [id number]  HPI: [name] is a 74 y.o. male  Chief Complaint  Patient presents with  . Hypertension   HYPERTENSION Hypertension status: controlled  Satisfied with current treatment? yes Duration of hypertension: newly diagnosed BP monitoring frequency:  not checking BP medication side effects:  yes Medication compliance: excellent compliance Recurrent headaches: no Visual changes: no Palpitations: no Dyspnea: no Chest pain: no Lower extremity edema: no Dizzy/lightheaded: no   No side effects from the thyroid medicine.   Relevant past medical, surgical, family and social history reviewed and updated as indicated. Interim medical history since our last visit reviewed. Allergies and medications reviewed and updated.  Review of Systems  Constitutional: Negative.   Respiratory: Negative.   Cardiovascular: Negative.   Psychiatric/Behavioral: Negative.     Per HPI unless specifically indicated above     Objective:    BP 112/58 mmHg  Pulse 78  Temp(Src) 97.6 F (36.4 C)  Wt 189 lb 9.6 oz (86.002 kg)  SpO2 95%  Wt Readings from Last 3 Encounters:  04/28/15 189 lb 9.6 oz (86.002 kg)  04/07/15 189 lb 12.8 oz (86.093 kg)  03/03/15 190 lb (86.183 kg)    Physical Exam  Constitutional: He is oriented to person, place, and time. He appears well-developed and well-nourished. No distress.  HENT:  Head: Normocephalic and atraumatic.  Right Ear: Hearing normal.  Left Ear: Hearing normal.  Nose: Nose normal.  Eyes: Conjunctivae and lids are normal. Right eye exhibits no discharge. Left eye exhibits no discharge. No scleral icterus.  Cardiovascular: Normal rate, regular rhythm and normal heart sounds.  Exam reveals no gallop and no friction rub.   No murmur heard. Pulmonary/Chest: Effort  normal. No respiratory distress. He has wheezes. He has no rales. He exhibits no tenderness.  Musculoskeletal: Normal range of motion.  Neurological: He is alert and oriented to person, place, and time.  Skin: Skin is intact. No rash noted.  Psychiatric: He has a normal mood and affect. His speech is normal and behavior is normal. Judgment and thought content normal. Cognition and memory are normal.  Nursing note and vitals reviewed.   Results for orders placed or performed in visit on 04/07/15  TSH  Result Value Ref Range   TSH 6.100 (H) 0.450 - 4.500 uIU/mL  CBC and differential  Result Value Ref Range   Hemoglobin 12.3 (A) 13.5 - 17.5 g/dL   HCT 36 (A) 41 - 53 %   Platelets 145 (A) 150 - 399 K/L   WBC 3.0 32^9/JM  Basic metabolic panel  Result Value Ref Range   Glucose 124 mg/dL   BUN 8 4 - 21 mg/dL   Creatinine 0.8 0.6 - 1.3 mg/dL   Potassium 4.6 3.4 - 5.3 mmol/L   Sodium 135 (A) 137 - 147 mmol/L  Lipid panel  Result Value Ref Range   Triglycerides 380 (A) 40 - 160 mg/dL   Cholesterol 158 0 - 200 mg/dL   HDL 28 (A) 35 - 70 mg/dL   LDL Cholesterol 54 mg/dL  Hepatic function panel  Result Value Ref Range   Alkaline Phosphatase 80 25 - 125 U/L   ALT 68 (A) 10 - 40 U/L   AST 49 (A) 14 - 40  U/L   Bilirubin, Total 0.4 mg/dL  Hemoglobin A1c  Result Value Ref Range   Hgb A1c MFr Bld 7.4 (A) 4.0 - 6.0 %  PSA  Result Value Ref Range   PSA 1.4   TSH  Result Value Ref Range   TSH 10.05 (A) 0.41 - 5.90 uIU/mL  CBC with Differential/Platelet  Result Value Ref Range   WBC 2.8 (L) 3.4 - 10.8 x10E3/uL   RBC 3.42 (L) 4.14 - 5.80 x10E6/uL   Hemoglobin 12.6 12.6 - 17.7 g/dL   Hematocrit 37.2 (L) 37.5 - 51.0 %   MCV 109 (H) 79 - 97 fL   MCH 36.8 (H) 26.6 - 33.0 pg   MCHC 33.9 31.5 - 35.7 g/dL   RDW 14.6 12.3 - 15.4 %   Platelets 151 150 - 379 x10E3/uL   Neutrophils 64 %   Lymphs 24 %   Monocytes 11 %   Eos 1 %   Basos 0 %   Neutrophils Absolute 1.7 1.4 - 7.0 x10E3/uL    Lymphocytes Absolute 0.7 0.7 - 3.1 x10E3/uL   Monocytes Absolute 0.3 0.1 - 0.9 x10E3/uL   EOS (ABSOLUTE) 0.0 0.0 - 0.4 x10E3/uL   Basophils Absolute 0.0 0.0 - 0.2 x10E3/uL   Immature Granulocytes 0 %   Immature Grans (Abs) 0.0 0.0 - 0.1 x10E3/uL  Specimen status report  Result Value Ref Range   specimen status report Comment   HM DIABETES FOOT EXAM  Result Value Ref Range   HM Diabetic Foot Exam normal       Assessment & Plan:   Problem List Items Addressed This Visit      Endocrine   Hypothyroidism    Due for recheck on TSH in 1 month.       Relevant Orders   TSH     Genitourinary   Benign hypertensive renal disease - Primary    Well controlled on recheck today. Continue current regimen. BMP checked today. Await results.         Hematopoietic and Hemostatic   Pancytopenia    Missed appointment with his oncologist. Rescheduled today. Patient aware of the appointment.        Other Visit Diagnoses    Type 2 diabetes mellitus with diabetic polyneuropathy        Relevant Medications    aspirin EC 81 MG tablet    Other Relevant Orders    Bayer DCA Hb A1c Waived        Follow up plan: Return 1 month lab visit, September Follow up with me for DM,  .

## 2015-04-28 NOTE — Assessment & Plan Note (Signed)
Due for recheck on TSH in 1 month.

## 2015-04-29 ENCOUNTER — Telehealth: Payer: Self-pay | Admitting: Family Medicine

## 2015-04-29 ENCOUNTER — Encounter: Payer: Self-pay | Admitting: Family Medicine

## 2015-04-29 LAB — BASIC METABOLIC PANEL
BUN / CREAT RATIO: 10 (ref 10–22)
BUN: 8 mg/dL (ref 8–27)
CO2: 24 mmol/L (ref 18–29)
CREATININE: 0.83 mg/dL (ref 0.76–1.27)
Calcium: 8.7 mg/dL (ref 8.6–10.2)
Chloride: 98 mmol/L (ref 97–108)
GFR calc Af Amer: 101 mL/min/{1.73_m2} (ref >59)
GFR, EST NON AFRICAN AMERICAN: 87 mL/min/{1.73_m2} (ref >59)
Glucose: 139 mg/dL — ABNORMAL HIGH (ref 65–99)
Potassium: 4.8 mmol/L (ref 3.5–5.2)
Sodium: 136 mmol/L (ref 134–144)

## 2015-04-29 NOTE — Telephone Encounter (Signed)
Patient notified, patient would like to know where he can get Diabetic shoes from, I informed him that he would have to have a office visit to directly address this issue. I scheduled this appointment with him.

## 2015-04-29 NOTE — Telephone Encounter (Signed)
Please let [name] know his labs came out normal (I think the [facility]nd home number is the right one)- he doesn't know how to use his answering machine, so don't leave a message.

## 2015-05-08 ENCOUNTER — Inpatient Hospital Stay: Payer: Medicare Other | Admitting: Oncology

## 2015-05-19 ENCOUNTER — Inpatient Hospital Stay: Payer: Medicare Other

## 2015-05-19 ENCOUNTER — Inpatient Hospital Stay: Payer: Medicare Other | Attending: Oncology | Admitting: Oncology

## 2015-05-19 VITALS — BP 143/76 | HR 67 | Temp 97.8°F | Resp 18 | Ht 68.9 in | Wt 191.1 lb

## 2015-05-19 DIAGNOSIS — F17220 Nicotine dependence, chewing tobacco, uncomplicated: Secondary | ICD-10-CM

## 2015-05-19 DIAGNOSIS — D61818 Other pancytopenia: Secondary | ICD-10-CM | POA: Diagnosis not present

## 2015-05-19 DIAGNOSIS — Z79899 Other long term (current) drug therapy: Secondary | ICD-10-CM | POA: Insufficient documentation

## 2015-05-19 DIAGNOSIS — E119 Type 2 diabetes mellitus without complications: Secondary | ICD-10-CM | POA: Diagnosis not present

## 2015-05-19 DIAGNOSIS — Z7982 Long term (current) use of aspirin: Secondary | ICD-10-CM | POA: Diagnosis not present

## 2015-05-19 DIAGNOSIS — I10 Essential (primary) hypertension: Secondary | ICD-10-CM | POA: Diagnosis not present

## 2015-05-19 DIAGNOSIS — F17210 Nicotine dependence, cigarettes, uncomplicated: Secondary | ICD-10-CM | POA: Diagnosis not present

## 2015-05-19 LAB — CBC
HCT: 36.2 % — ABNORMAL LOW (ref 40.0–52.0)
Hemoglobin: 12.7 g/dL — ABNORMAL LOW (ref 13.0–18.0)
MCH: 37.8 pg — ABNORMAL HIGH (ref 26.0–34.0)
MCHC: 35 g/dL (ref 32.0–36.0)
MCV: 108.2 fL — AB (ref 80.0–100.0)
Platelets: 129 10*3/uL — ABNORMAL LOW (ref 150–440)
RBC: 3.35 MIL/uL — ABNORMAL LOW (ref 4.40–5.90)
RDW: 13.3 % (ref 11.5–14.5)
WBC: 3.1 10*3/uL — ABNORMAL LOW (ref 3.8–10.6)

## 2015-05-19 LAB — IRON AND TIBC
Iron: 230 ug/dL — ABNORMAL HIGH (ref 45–182)
SATURATION RATIOS: 72 % — AB (ref 17.9–39.5)
TIBC: 321 ug/dL (ref 250–450)
UIBC: 91 ug/dL

## 2015-05-19 LAB — LACTATE DEHYDROGENASE: LDH: 191 U/L (ref 98–192)

## 2015-05-19 LAB — FERRITIN: Ferritin: 452 ng/mL — ABNORMAL HIGH (ref 24–336)

## 2015-05-19 LAB — FOLATE: Folate: 9.8 ng/mL (ref >5.9)

## 2015-05-19 LAB — VITAMIN B12: Vitamin B-12: 137 pg/mL — ABNORMAL LOW (ref 180–914)

## 2015-05-19 NOTE — Progress Notes (Signed)
[facility]  [phone] [phone]  ID: [name] OB: 02-27-1941  MR#: [id number]  KGU#:[id number]  Patient Care Team: Valerie Roys, DO as PCP - General (Family Medicine)  CHIEF COMPLAINT:  Chief Complaint  Patient presents with  . New Evaluation    pancytopenia    INTERVAL HISTORY: Patient is a 74 year old male who was found to have pancytopenia on routine blood work. Repeat blood work confirmed the results. He currently feels well and is asymptomatic. He denies any recent fevers or illnesses. He has no new medications. He denies any easy bleeding or bruising. He denies any weakness or fatigue. He denies any chest pain or shortness of breath. He has no nausea, vomiting, constipation, or diarrhea. He has no urinary complaints. Patient feels at his baseline and offers no specific complaints today.  REVIEW OF SYSTEMS:   Review of Systems  Constitutional: Negative.  Negative for fever.  Endo/Heme/Allergies: Does not bruise/bleed easily.    As per HPI. Otherwise, a complete review of systems is negatve.  PAST MEDICAL HISTORY: Past Medical History  Diagnosis Date  . Diabetes mellitus   . 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 Hypertension     PAST SURGICAL HISTORY: No past surgical history on file.  FAMILY HISTORY Family History  Problem Relation Age of Onset  . Heart disease Mother        ADVANCED DIRECTIVES:    HEALTH MAINTENANCE: History  Substance Use Topics  . Smoking status: Current Some Day Smoker -- 0.25 packs/day    Types: Cigarettes  . Smokeless tobacco: Current User    Types: Chew  . Alcohol Use: 0.0 oz/week    0 Standard drinks or equivalent per week     Comment: rarely     Colonoscopy:  PAP:  Bone density:  Lipid panel:  No Known Allergies  Current Outpatient Prescriptions  Medication Sig Dispense Refill  . aspirin EC 81 MG tablet Take 81 mg by mouth daily.     Marland Kitchen levothyroxine (LEVOTHROID) 25 MCG tablet Take 1 tablet (25 mcg total) by mouth daily before breakfast. 30 tablet 1  . lisinopril (PRINIVIL,ZESTRIL) 5 MG tablet Take 1 tablet (5 mg total) by mouth daily. 30 tablet 3  . metFORMIN (GLUMETZA) 1000 MG (MOD) 24 hr tablet Take 1 tablet (1,000 mg total) by mouth daily with breakfast. 30 tablet 3  . naproxen sodium (ANAPROX) 220 MG tablet Take 440 mg by mouth daily as needed.     No current facility-administered medications for this visit.    OBJECTIVE: Filed Vitals:   05/19/15 1547  BP: 143/76  Pulse: 67  Temp: 97.8 F (36.6 C)  Resp: 18     Body mass index is 28.31 kg/(m^2).    ECOG FS:0 - Asymptomatic  General: Well-developed, well-nourished, no acute distress. Eyes: Pink conjunctiva, anicteric sclera. HEENT: Normocephalic, moist mucous membranes, clear oropharnyx. Lungs: Clear to auscultation bilaterally. Heart: Regular rate and rhythm. No rubs, murmurs, or gallops. Abdomen: Soft, nontender, nondistended. No organomegaly noted, normoactive bowel sounds. Musculoskeletal: No edema, cyanosis, or clubbing. Neuro: Alert, answering all questions appropriately. Cranial nerves grossly intact. Skin: No rashes or petechiae noted. Psych: Normal affect. Lymphatics: No cervical, calvicular, axillary or inguinal LAD.   LAB RESULTS:  Lab Results  Component Value Date   NA 136 04/28/2015   K 4.8 04/28/2015   CL 98 04/28/2015   CO2 24 04/28/2015   GLUCOSE 139* 04/28/2015  BUN 8 04/28/2015   CREATININE 0.83 04/28/2015   CALCIUM 8.7 04/28/2015   PROT 6.3* 01/12/2013   ALBUMIN 3.7 01/12/2013   AST 49* 03/03/2015   ALT 68* 03/03/2015   ALKPHOS 80 03/03/2015   BILITOT 0.4 01/12/2013   GFRNONAA 87 04/28/2015   GFRAA 101 04/28/2015    Lab Results  Component Value Date   WBC 2.8* 04/07/2015   NEUTROABS 1.7 04/07/2015   HGB 12.3* 03/03/2015   HCT 37.2* 04/07/2015   MCV 104* 01/13/2013   PLT 145* 03/03/2015     STUDIES: No  results found.  ASSESSMENT: Pancytopenia.  PLAN:    1. Pancytopenia: Patient's white blood cell, red blood cell count, and platelets are mildly decreased. Unclear etiology, but full laboratory workup has been ordered and is pending at time of dictation. Suspect pancytopenia may be secondary to mild bone marrow suppression from patient's increased alcohol use. No intervention is needed at this time. Patient does not require bone marrow biopsy. Will consider abdominal ultrasound in the future to assess for splenomegaly. Return to clinic in 3 months for repeat laboratory work and further evaluation. If there are any significant abnormalities in his pending blood work from today, we will call the patient to return to clinic sooner to discuss the results.  Patient expressed understanding and was in agreement with this plan. He also understands that He can call clinic at any time with any questions, concerns, or complaints.    Lloyd Huger, MD   05/19/2015 4:42 PM

## 2015-05-27 LAB — NEUTROPHIL AB TEST LEVEL 1: NEUTROPHIL SCR/PANEL INTERP.: NEGATIVE

## 2015-05-27 LAB — HAPTOGLOBIN: HAPTOGLOBIN: 84 mg/dL (ref 34–200)

## 2015-05-29 ENCOUNTER — Ambulatory Visit: Payer: Self-pay | Admitting: Family Medicine

## 2015-05-29 ENCOUNTER — Other Ambulatory Visit: Payer: Medicare Other

## 2015-06-05 ENCOUNTER — Other Ambulatory Visit: Payer: Medicare Other

## 2015-06-05 ENCOUNTER — Other Ambulatory Visit: Payer: Self-pay | Admitting: Family Medicine

## 2015-06-05 DIAGNOSIS — E039 Hypothyroidism, unspecified: Secondary | ICD-10-CM | POA: Diagnosis not present

## 2015-06-05 NOTE — Telephone Encounter (Signed)
rx

## 2015-06-05 NOTE — Telephone Encounter (Signed)
Your patient.  Thanks 

## 2015-06-06 ENCOUNTER — Telehealth: Payer: Self-pay | Admitting: Family Medicine

## 2015-06-06 LAB — TSH: TSH: 3.63 u[IU]/mL (ref 0.450–4.500)

## 2015-06-06 NOTE — Telephone Encounter (Signed)
Gave him the results of his TSH. Year supply sent to his pharmacy this AM

## 2015-06-30 ENCOUNTER — Other Ambulatory Visit: Payer: Self-pay | Admitting: Family Medicine

## 2015-06-30 ENCOUNTER — Ambulatory Visit (INDEPENDENT_AMBULATORY_CARE_PROVIDER_SITE_OTHER): Payer: Medicare Other | Admitting: Family Medicine

## 2015-06-30 ENCOUNTER — Encounter: Payer: Self-pay | Admitting: Family Medicine

## 2015-06-30 VITALS — BP 140/68 | HR 77 | Temp 98.5°F | Wt 191.0 lb

## 2015-06-30 DIAGNOSIS — E1165 Type 2 diabetes mellitus with hyperglycemia: Secondary | ICD-10-CM | POA: Diagnosis not present

## 2015-06-30 DIAGNOSIS — E1142 Type 2 diabetes mellitus with diabetic polyneuropathy: Secondary | ICD-10-CM | POA: Diagnosis not present

## 2015-06-30 DIAGNOSIS — IMO0002 Reserved for concepts with insufficient information to code with codable children: Secondary | ICD-10-CM

## 2015-06-30 DIAGNOSIS — E1140 Type 2 diabetes mellitus with diabetic neuropathy, unspecified: Secondary | ICD-10-CM

## 2015-06-30 DIAGNOSIS — I129 Hypertensive chronic kidney disease with stage 1 through stage 4 chronic kidney disease, or unspecified chronic kidney disease: Secondary | ICD-10-CM

## 2015-06-30 LAB — BAYER DCA HB A1C WAIVED: HB A1C (BAYER DCA - WAIVED): 6.7 % (ref <7.0)

## 2015-06-30 MED ORDER — LISINOPRIL 10 MG PO TABS: 5.0000 mg | ORAL_TABLET | Freq: Every day | ORAL | Status: DC

## 2015-06-30 NOTE — Assessment & Plan Note (Signed)
Has not been taking his metformin because it made him vomit. A1c is 6.7 today. Will stop metformin and recheck a1c in 3 months. Continue to monitor.

## 2015-06-30 NOTE — Patient Instructions (Addendum)
   STOP Metformin because it caused vominting.   STOP 5mg  lisinipril  START 10mg  lisinopril

## 2015-06-30 NOTE — Progress Notes (Signed)
BP 140/68 mmHg  Pulse 77  Temp(Src) 98.5 F (36.9 C)  Wt 191 lb (86.637 kg)  SpO2 96%   Subjective:    Patient ID: [name], male    DOB: May 30, 1941, 74 y.o.   MRN: [id number]  HPI: [name] is a 74 y.o. male  Chief Complaint  Patient presents with  . Diabetes  . Hypertension   HYPERTENSION Hypertension status: uncontrolled  Satisfied with current treatment? yes Duration of hypertension: chronic BP monitoring frequency:  not checking BP medication side effects:  no Medication compliance: good compliance Aspirin: yes Recurrent headaches: no Visual changes: no Palpitations: no Dyspnea: no Chest pain: no Lower extremity edema: no Dizzy/lightheaded: no  DIABETES- has been vomiting with the metformin so has not been taking it. Took it for 3 days, then stopped.  Hypoglycemic episodes:no Polydipsia/polyuria: no Visual disturbance: no Chest pain: no Paresthesias: no Glucose Monitoring: no Blood Pressure Monitoring: not checking Retinal Examination: Not up to Date Foot Exam: Up to Date Diabetic Education: Not Completed Pneumovax: refused Influenza: refused Aspirin: yes  Relevant past medical, surgical, family and social history reviewed and updated as indicated. Interim medical history since our last visit reviewed. Allergies and medications reviewed and updated.  Review of Systems  Constitutional: Negative.   Respiratory: Negative.   Cardiovascular: Negative.   Gastrointestinal: Negative.   Psychiatric/Behavioral: Negative.     Per HPI unless specifically indicated above     Objective:    BP 140/68 mmHg  Pulse 77  Temp(Src) 98.5 F (36.9 C)  Wt 191 lb (86.637 kg)  SpO2 96%  Wt Readings from Last 3 Encounters:  06/30/15 191 lb (86.637 kg)  05/19/15 191 lb 2.2 oz (86.699 kg)  04/28/15 189 lb 9.6 oz (86.002 kg)    Physical Exam  Constitutional: He is oriented to person, place, and time. He appears well-developed and well-nourished. No  distress.  HENT:  Head: Normocephalic and atraumatic.  Right Ear: Hearing normal.  Left Ear: Hearing normal.  Nose: Nose normal.  Eyes: Conjunctivae and lids are normal. Right eye exhibits no discharge. Left eye exhibits no discharge. No scleral icterus.  Cardiovascular: Normal rate, regular rhythm, normal heart sounds and intact distal pulses.  Exam reveals no gallop and no friction rub.   No murmur heard. Pulmonary/Chest: Effort normal and breath sounds normal. No respiratory distress. He has no wheezes. He has no rales. He exhibits no tenderness.  Musculoskeletal: Normal range of motion.  Neurological: He is alert and oriented to person, place, and time.  Skin: Skin is warm, dry and intact. No rash noted. No erythema. No pallor.  Psychiatric: He has a normal mood and affect. His speech is normal and behavior is normal. Judgment and thought content normal. Cognition and memory are normal.  Nursing note and vitals reviewed.   Results for orders placed or performed in visit on 06/05/15  TSH  Result Value Ref Range   TSH 3.630 0.450 - 4.500 uIU/mL      Assessment & Plan:   Problem List Items Addressed This Visit      Endocrine   Type 2 diabetes, uncontrolled, with neuropathy    Has not been taking his metformin because it made him vomit. A1c is 6.7 today. Will stop metformin and recheck a1c in 3 months. Continue to monitor.       Relevant Medications   lisinopril (PRINIVIL,ZESTRIL) 10 MG tablet     Genitourinary   Benign hypertensive renal disease - Primary    Better  on recheck, but not under good control. Will increase lisinopril to 10mg  daily and recheck in 1 month.       Relevant Medications   lisinopril (PRINIVIL,ZESTRIL) 10 MG tablet    Other Visit Diagnoses    Type 2 diabetes mellitus with diabetic polyneuropathy        Relevant Medications    lisinopril (PRINIVIL,ZESTRIL) 10 MG tablet    Other Relevant Orders    Ambulatory referral to Ophthalmology         Follow up plan: Return in about 3 months (around 09/29/2015) for DM visit.

## 2015-06-30 NOTE — Assessment & Plan Note (Signed)
Better on recheck, but not under good control. Will increase lisinopril to 10mg  daily and recheck in 1 month.

## 2015-08-15 ENCOUNTER — Other Ambulatory Visit: Payer: Self-pay | Admitting: *Deleted

## 2015-08-15 DIAGNOSIS — D61818 Other pancytopenia: Secondary | ICD-10-CM

## 2015-08-20 ENCOUNTER — Inpatient Hospital Stay (HOSPITAL_BASED_OUTPATIENT_CLINIC_OR_DEPARTMENT_OTHER): Payer: Medicare Other | Admitting: Oncology

## 2015-08-20 ENCOUNTER — Inpatient Hospital Stay: Payer: Medicare Other | Attending: Oncology

## 2015-08-20 ENCOUNTER — Encounter: Payer: Self-pay | Admitting: Oncology

## 2015-08-20 VITALS — BP 152/69 | HR 74 | Temp 97.2°F | Resp 18 | Wt 191.4 lb

## 2015-08-20 DIAGNOSIS — Z7982 Long term (current) use of aspirin: Secondary | ICD-10-CM | POA: Insufficient documentation

## 2015-08-20 DIAGNOSIS — D72819 Decreased white blood cell count, unspecified: Secondary | ICD-10-CM

## 2015-08-20 DIAGNOSIS — F17210 Nicotine dependence, cigarettes, uncomplicated: Secondary | ICD-10-CM | POA: Diagnosis not present

## 2015-08-20 DIAGNOSIS — I10 Essential (primary) hypertension: Secondary | ICD-10-CM | POA: Insufficient documentation

## 2015-08-20 DIAGNOSIS — Z79899 Other long term (current) drug therapy: Secondary | ICD-10-CM

## 2015-08-20 DIAGNOSIS — E119 Type 2 diabetes mellitus without complications: Secondary | ICD-10-CM | POA: Insufficient documentation

## 2015-08-20 DIAGNOSIS — D649 Anemia, unspecified: Secondary | ICD-10-CM | POA: Diagnosis not present

## 2015-08-20 DIAGNOSIS — D61818 Other pancytopenia: Secondary | ICD-10-CM

## 2015-08-20 LAB — CBC WITH DIFFERENTIAL/PLATELET
BASOS ABS: 0 10*3/uL (ref 0–0.1)
Basophils Relative: 0 %
EOS PCT: 1 %
Eosinophils Absolute: 0 10*3/uL (ref 0–0.7)
HEMATOCRIT: 36.4 % — AB (ref 40.0–52.0)
Hemoglobin: 12.7 g/dL — ABNORMAL LOW (ref 13.0–18.0)
LYMPHS ABS: 0.6 10*3/uL — AB (ref 1.0–3.6)
LYMPHS PCT: 22 %
MCH: 37.7 pg — AB (ref 26.0–34.0)
MCHC: 34.8 g/dL (ref 32.0–36.0)
MCV: 108.3 fL — AB (ref 80.0–100.0)
MONO ABS: 0.2 10*3/uL (ref 0.2–1.0)
MONOS PCT: 6 %
NEUTROS ABS: 2.1 10*3/uL (ref 1.4–6.5)
Neutrophils Relative %: 71 %
PLATELETS: 156 10*3/uL (ref 150–440)
RBC: 3.36 MIL/uL — ABNORMAL LOW (ref 4.40–5.90)
RDW: 13.2 % (ref 11.5–14.5)
WBC: 2.9 10*3/uL — ABNORMAL LOW (ref 3.8–10.6)

## 2015-09-01 NOTE — Progress Notes (Signed)
[name]  [phone] [phone]) [phone]  ID: [name] OB: 1941/04/28  MR#: [id number]  QAS#:[id number]  Patient Care Team: Valerie Roys, DO as PCP - General (Family Medicine)  CHIEF COMPLAINT:  Chief Complaint  Patient presents with  . Pancytopenia    INTERVAL HISTORY: Patient returns to clinic today for repeat laboratory work and further evaluation. He continues to feel well and is asymptomatic. He denies any fevers or illnesses. He denies any easy bleeding or bruising. He denies any weakness or fatigue. He denies any chest pain or shortness of breath. He has no nausea, vomiting, constipation, or diarrhea. He has no urinary complaints. Patient feels at his baseline and offers no specific complaints today.  REVIEW OF SYSTEMS:   Review of Systems  Constitutional: Negative.  Negative for fever and malaise/fatigue.  Respiratory: Negative.   Cardiovascular: Negative.   Gastrointestinal: Negative.   Musculoskeletal: Negative.   Neurological: Negative.  Negative for weakness.  Endo/Heme/Allergies: Does not bruise/bleed easily.    As per HPI. Otherwise, a complete review of systems is negatve.  PAST MEDICAL HISTORY: Past Medical History  Diagnosis Date  . Diabetes mellitus   . 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 Hypertension     PAST SURGICAL HISTORY: No past surgical history on file.  FAMILY HISTORY Family History  Problem Relation Age of Onset  . Heart disease Mother        ADVANCED DIRECTIVES:    HEALTH MAINTENANCE: Social History  Substance Use Topics  . Smoking status: Current Some Day Smoker -- 0.25 packs/day    Types: Cigarettes  . Smokeless tobacco: Current User    Types: Chew  . Alcohol Use: 0.0 oz/week    0 Standard drinks or equivalent per week     Comment: rarely     Colonoscopy:  PAP:  Bone density:  Lipid panel:  Allergies  Allergen Reactions  . Metformin  And Related Nausea And Vomiting    Current Outpatient Prescriptions  Medication Sig Dispense Refill  . aspirin EC 81 MG tablet Take 81 mg by mouth daily.    Marland Kitchen levothyroxine (SYNTHROID, LEVOTHROID) 25 MCG tablet Take 1 tablet (25 mcg total) by mouth daily before breakfast. 30 tablet 12  . lisinopril (PRINIVIL,ZESTRIL) 10 MG tablet Take 0.5 tablets (5 mg total) by mouth daily. 30 tablet 3  . naproxen sodium (ANAPROX) 220 MG tablet Take 440 mg by mouth daily as needed.     No current facility-administered medications for this visit.    OBJECTIVE: Filed Vitals:   08/20/15 1048  BP: 152/69  Pulse: 74  Temp: 97.2 F (36.2 C)  Resp: 18     Body mass index is 28.34 kg/(m^2).    ECOG FS:0 - Asymptomatic  General: Well-developed, well-nourished, no acute distress. Eyes: Pink conjunctiva, anicteric sclera. Lungs: Clear to auscultation bilaterally. Heart: Regular rate and rhythm. No rubs, murmurs, or gallops. Abdomen: Soft, nontender, nondistended. No organomegaly noted, normoactive bowel sounds. Musculoskeletal: No edema, cyanosis, or clubbing. Neuro: Alert, answering all questions appropriately. Cranial nerves grossly intact. Skin: No rashes or petechiae noted. Psych: Normal affect.   LAB RESULTS:  Lab Results  Component Value Date   NA 136 04/28/2015   K 4.8 04/28/2015   CL 98 04/28/2015   CO2 24 04/28/2015   GLUCOSE 139* 04/28/2015   BUN 8 04/28/2015   CREATININE 0.83 04/28/2015   CALCIUM 8.7 04/28/2015   PROT 6.3* 01/12/2013  ALBUMIN 3.7 01/12/2013   AST 49* 03/03/2015   ALT 68* 03/03/2015   ALKPHOS 80 03/03/2015   BILITOT 0.4 01/12/2013   GFRNONAA 87 04/28/2015   GFRAA 101 04/28/2015    Lab Results  Component Value Date   WBC 2.9* 08/20/2015   NEUTROABS 2.1 08/20/2015   HGB 12.7* 08/20/2015   HCT 36.4* 08/20/2015   MCV 108.3* 08/20/2015   PLT 156 08/20/2015     STUDIES: No results found.  ASSESSMENT:  Leukopenia, anemia.  PLAN:    1.  Leukopenia:  patient's white blood cell count is mildly decreased, but unchanged.  All of his other labs were either negative or within normal limits. Suspect  leukopenia may be secondary to mild bone marrow suppression from patient's increased alcohol use. No intervention is needed at this time. Patient does not require bone marrow biopsy. Will consider abdominal ultrasound in the future to assess for splenomegaly. Return to clinic in 6 months for repeat laboratory work and further evaluation.   2. Anemia: mild. Given patient's elevated MCV, will check B-12 and folate at next clinic visit.  Patient expressed understanding and was in agreement with this plan. He also understands that He can call clinic at any time with any questions, concerns, or complaints.    Lloyd Huger, MD   09/01/2015 7:01 AM

## 2015-09-25 ENCOUNTER — Ambulatory Visit (INDEPENDENT_AMBULATORY_CARE_PROVIDER_SITE_OTHER): Payer: Medicare Other | Admitting: Family Medicine

## 2015-09-25 ENCOUNTER — Encounter: Payer: Self-pay | Admitting: Family Medicine

## 2015-09-25 VITALS — BP 144/60 | HR 70 | Temp 98.1°F | Ht 69.0 in | Wt 192.0 lb

## 2015-09-25 DIAGNOSIS — Z23 Encounter for immunization: Secondary | ICD-10-CM

## 2015-09-25 DIAGNOSIS — E039 Hypothyroidism, unspecified: Secondary | ICD-10-CM | POA: Diagnosis not present

## 2015-09-25 DIAGNOSIS — E1140 Type 2 diabetes mellitus with diabetic neuropathy, unspecified: Secondary | ICD-10-CM

## 2015-09-25 DIAGNOSIS — E1122 Type 2 diabetes mellitus with diabetic chronic kidney disease: Secondary | ICD-10-CM | POA: Diagnosis not present

## 2015-09-25 DIAGNOSIS — N181 Chronic kidney disease, stage 1: Secondary | ICD-10-CM

## 2015-09-25 DIAGNOSIS — I129 Hypertensive chronic kidney disease with stage 1 through stage 4 chronic kidney disease, or unspecified chronic kidney disease: Secondary | ICD-10-CM | POA: Diagnosis not present

## 2015-09-25 DIAGNOSIS — E1165 Type 2 diabetes mellitus with hyperglycemia: Secondary | ICD-10-CM

## 2015-09-25 DIAGNOSIS — S0180XA Unspecified open wound of other part of head, initial encounter: Secondary | ICD-10-CM | POA: Diagnosis not present

## 2015-09-25 DIAGNOSIS — IMO0002 Reserved for concepts with insufficient information to code with codable children: Secondary | ICD-10-CM

## 2015-09-25 LAB — BAYER DCA HB A1C WAIVED: HB A1C: 6.8 % (ref <7.0)

## 2015-09-25 NOTE — Assessment & Plan Note (Signed)
A1c 6.8 off medication. Continue off medicine. Continue to monitor. Recheck in 6 months.

## 2015-09-25 NOTE — Assessment & Plan Note (Addendum)
Not under good control today, but hasn't taken his medicine yet. Will return in 3 weeks for BP check.

## 2015-09-25 NOTE — Assessment & Plan Note (Signed)
Checking labs today. Will adjust as needed.

## 2015-09-25 NOTE — Progress Notes (Signed)
BP 144/60 mmHg  Pulse 70  Temp(Src) 98.1 F (36.7 C)  Ht 5\' 9"  (1.753 m)  Wt 192 lb (87.091 kg)  BMI 28.34 kg/m2  SpO2 99%   Subjective:    Patient ID: [name], male    DOB: 11-Mar-1941, 74 y.o.   MRN: [id number]  HPI: [name] is a 74 y.o. male  Chief Complaint  Patient presents with  . Diabetes  . Hypertension    meds not taken yet today   DIABETES Hypoglycemic episodes:no Polydipsia/polyuria: yes Visual disturbance: no Chest pain: no Paresthesias: no Glucose Monitoring: no Blood Pressure Monitoring: not checking Retinal Examination: Not up to Date Foot Exam: Up to Date Diabetic Education: Not Completed Pneumovax: Up to Date Influenza: Up to Date Aspirin: no  HYPOTHYROIDISM Thyroid control status: stable Satisfied with current treatment? yes Medication side effects: no Medication compliance: good compliance Recent dose adjustment:no Fatigue: yes Cold intolerance: no Heat intolerance: no Weight gain: no Weight loss: no Constipation: no Diarrhea/loose stools: yes Palpitations: no Lower extremity edema: no Anxiety/depressed mood: no  HYPERTENSION Hypertension status: uncontrolled  Satisfied with current treatment? yes Duration of hypertension: chronic BP monitoring frequency:  not checking BP medication side effects:  no Medication compliance: good compliance Aspirin: no Recurrent headaches: no Visual changes: no Palpitations: no Dyspnea: no Chest pain: no Lower extremity edema: no Dizzy/lightheaded: no   Relevant past medical, surgical, family and social history reviewed and updated as indicated. Interim medical history since our last visit reviewed. Allergies and medications reviewed and updated.  Review of Systems  Constitutional: Negative.   Respiratory: Negative.   Cardiovascular: Negative.   Psychiatric/Behavioral: Negative.     Per HPI unless specifically indicated above     Objective:    BP 144/60 mmHg  Pulse 70   Temp(Src) 98.1 F (36.7 C)  Ht 5\' 9"  (1.753 m)  Wt 192 lb (87.091 kg)  BMI 28.34 kg/m2  SpO2 99%  Wt Readings from Last 3 Encounters:  09/25/15 192 lb (87.091 kg)  08/20/15 191 lb 5.8 oz (86.8 kg)  06/30/15 191 lb (86.637 kg)    Physical Exam  Constitutional: He is oriented to person, place, and time. He appears well-developed and well-nourished. No distress.  HENT:  Head: Normocephalic and atraumatic.    Right Ear: Hearing and external ear normal.  Left Ear: Hearing and external ear normal.  Nose: Nose normal.  Mouth/Throat: Oropharynx is clear and moist. No oropharyngeal exudate.  Eyes: Conjunctivae, EOM and lids are normal. Pupils are equal, round, and reactive to light. Right eye exhibits no discharge. Left eye exhibits no discharge. No scleral icterus.  Cardiovascular: Normal rate, regular rhythm, normal heart sounds and intact distal pulses.  Exam reveals no gallop and no friction rub.   No murmur heard. Pulmonary/Chest: Effort normal and breath sounds normal. No respiratory distress. He has no wheezes. He has no rales. He exhibits no tenderness.  Musculoskeletal: Normal range of motion.  Neurological: He is alert and oriented to person, place, and time.  Skin: Skin is warm, dry and intact. No rash noted. He is not diaphoretic. No erythema. No pallor.  Well healing wound on chin. Due for tetanus  Psychiatric: He has a normal mood and affect. His speech is normal and behavior is normal. Judgment and thought content normal. Cognition and memory are normal.  Nursing note and vitals reviewed.   Results for orders placed or performed in visit on 08/20/15  CBC with Differential/Platelet  Result Value Ref Range  WBC 2.9 (L) 3.8 - 10.6 K/uL   RBC 3.36 (L) 4.40 - 5.90 MIL/uL   Hemoglobin 12.7 (L) 13.0 - 18.0 g/dL   HCT 36.4 (L) 40.0 - 52.0 %   MCV 108.3 (H) 80.0 - 100.0 fL   MCH 37.7 (H) 26.0 - 34.0 pg   MCHC 34.8 32.0 - 36.0 g/dL   RDW 13.2 11.5 - 14.5 %   Platelets 156 150  - 440 K/uL   Neutrophils Relative % 71 %   Neutro Abs 2.1 1.4 - 6.5 K/uL   Lymphocytes Relative 22 %   Lymphs Abs 0.6 (L) 1.0 - 3.6 K/uL   Monocytes Relative 6 %   Monocytes Absolute 0.2 0.2 - 1.0 K/uL   Eosinophils Relative 1 %   Eosinophils Absolute 0.0 0 - 0.7 K/uL   Basophils Relative 0 %   Basophils Absolute 0.0 0 - 0.1 K/uL      Assessment & Plan:   Problem List Items Addressed This Visit      Endocrine   Type 2 diabetes, uncontrolled, with neuropathy ([place]) - Primary    A1c 6.8 off medication. Continue off medicine. Continue to monitor. Recheck in 6 months.       Relevant Orders   Comprehensive metabolic panel   Bayer DCA Hb A1c Waived   Hypothyroidism    Checking labs today. Will adjust as needed.       Relevant Orders   TSH     Genitourinary   Benign hypertensive renal disease    Not under good control today, but hasn't taken his medicine yet. Will return in 3 weeks for BP check.       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 Continue to monitor.       Relevant Orders   Comprehensive metabolic panel    Other Visit Diagnoses    Open wound of face, initial encounter        Healing well. Due for tetanus. Ordered and given today.    Relevant Orders    Td : Tetanus/diphtheria >7yo Preservative  free (Completed)        Follow up plan: Return in about 3 weeks (around 10/16/2015) for BP check with me.

## 2015-09-25 NOTE — Assessment & Plan Note (Signed)
Checking CMP today. Continue to monitor.

## 2015-09-26 ENCOUNTER — Telehealth: Payer: Self-pay | Admitting: Family Medicine

## 2015-09-26 LAB — COMPREHENSIVE METABOLIC PANEL
A/G RATIO: 2.5 (ref 1.1–2.5)
ALBUMIN: 4.2 g/dL (ref 3.5–4.8)
ALK PHOS: 113 IU/L (ref 39–117)
ALT: 81 IU/L — ABNORMAL HIGH (ref 0–44)
AST: 53 IU/L — ABNORMAL HIGH (ref 0–40)
BILIRUBIN TOTAL: 0.4 mg/dL (ref 0.0–1.2)
BUN / CREAT RATIO: 8 — AB (ref 10–22)
BUN: 6 mg/dL — ABNORMAL LOW (ref 8–27)
CHLORIDE: 100 mmol/L (ref 96–106)
CO2: 28 mmol/L (ref 18–29)
CREATININE: 0.76 mg/dL (ref 0.76–1.27)
Calcium: 9 mg/dL (ref 8.6–10.2)
GFR calc Af Amer: 104 mL/min/{1.73_m2} (ref >59)
GFR calc non Af Amer: 90 mL/min/{1.73_m2} (ref >59)
GLOBULIN, TOTAL: 1.7 g/dL (ref 1.5–4.5)
Glucose: 168 mg/dL — ABNORMAL HIGH (ref 65–99)
POTASSIUM: 4.9 mmol/L (ref 3.5–5.2)
SODIUM: 140 mmol/L (ref 134–144)
Total Protein: 5.9 g/dL — ABNORMAL LOW (ref 6.0–8.5)

## 2015-09-26 LAB — TSH: TSH: 6.53 u[IU]/mL — AB (ref 0.450–4.500)

## 2015-09-26 MED ORDER — LEVOTHYROXINE SODIUM 50 MCG PO TABS: 50.0000 ug | ORAL_TABLET | Freq: Every day | ORAL | Status: DC

## 2015-09-26 NOTE — Telephone Encounter (Signed)
Please let him know that his thyroid was high again. Please check to make sure he was taking his thyroid medicine. If he was, I'll increase his dose and we'll recheck him at an appointment in 6 weeks. Thank you!!

## 2015-09-26 NOTE — Telephone Encounter (Signed)
Rx sent to his pharmacy

## 2015-09-26 NOTE — Telephone Encounter (Signed)
Patient was taking his medication.  He is scheduled for Jan. 23 @ 8:30 for follow up with you

## 2015-09-29 ENCOUNTER — Ambulatory Visit: Payer: Medicare Other | Admitting: Family Medicine

## 2015-10-15 ENCOUNTER — Encounter: Payer: Self-pay | Admitting: Family Medicine

## 2015-10-15 ENCOUNTER — Ambulatory Visit (INDEPENDENT_AMBULATORY_CARE_PROVIDER_SITE_OTHER): Payer: Medicare Other | Admitting: Family Medicine

## 2015-10-15 VITALS — BP 128/72 | HR 73 | Temp 97.6°F | Ht 68.4 in | Wt 189.0 lb

## 2015-10-15 DIAGNOSIS — E039 Hypothyroidism, unspecified: Secondary | ICD-10-CM

## 2015-10-15 DIAGNOSIS — I129 Hypertensive chronic kidney disease with stage 1 through stage 4 chronic kidney disease, or unspecified chronic kidney disease: Secondary | ICD-10-CM | POA: Diagnosis not present

## 2015-10-15 MED ORDER — LISINOPRIL 10 MG PO TABS: 5.0000 mg | ORAL_TABLET | Freq: Every day | ORAL | Status: DC

## 2015-10-15 NOTE — Assessment & Plan Note (Signed)
Under good control today. Continue current regimen. Continue to monitor.  

## 2015-10-15 NOTE — Assessment & Plan Note (Signed)
Due for recheck on his levels in 3 weeks, will return for lab visit at that time.

## 2015-10-15 NOTE — Progress Notes (Signed)
BP 128/72 mmHg  Pulse 73  Temp(Src) 97.6 F (36.4 C)  Ht 5' 8.4" (1.737 m)  Wt 189 lb (85.73 kg)  BMI 28.41 kg/m2  SpO2 97%   Subjective:    Patient ID: [name], male    DOB: [birth year], 75 y.o.   MRN: [id number]  HPI: [name] is a 75 y.o. male  Chief Complaint  Patient presents with  . Hypertension  . Eye Exam    Patient was scheduled for a routine eye exam. Appointment is 11/11/15 @ 8:15am at [facility], patient was notiified.   HYPERTENSION Hypertension status: controlled  Satisfied with current treatment? yes Duration of hypertension: chronic BP monitoring frequency:  not checking BP medication side effects:  no Medication compliance: excellent compliance Aspirin: no Recurrent headaches: no Visual changes: no Palpitations: no Dyspnea: no Chest pain: no Lower extremity edema: no Dizzy/lightheaded: no  Relevant past medical, surgical, family and social history reviewed and updated as indicated. Interim medical history since our last visit reviewed. Allergies and medications reviewed and updated.  Review of Systems  Constitutional: Negative.   Respiratory: Negative.   Cardiovascular: Negative.   Musculoskeletal: Negative.   Psychiatric/Behavioral: Negative.     Per HPI unless specifically indicated above     Objective:    BP 128/72 mmHg  Pulse 73  Temp(Src) 97.6 F (36.4 C)  Ht 5' 8.4" (1.737 m)  Wt 189 lb (85.73 kg)  BMI 28.41 kg/m2  SpO2 97%  Wt Readings from Last 3 Encounters:  10/15/15 189 lb (85.73 kg)  09/25/15 192 lb (87.091 kg)  08/20/15 191 lb 5.8 oz (86.8 kg)    Physical Exam  Constitutional: He is oriented to person, place, and time. He appears well-developed and well-nourished. No distress.  HENT:  Head: Normocephalic and atraumatic.  Right Ear: Hearing and external ear normal.  Left Ear: Hearing and external ear normal.  Nose: Nose normal.  Mouth/Throat: Oropharynx is clear and moist. No oropharyngeal exudate.   Eyes: Conjunctivae, EOM and lids are normal. Pupils are equal, round, and reactive to light. Right eye exhibits no discharge. Left eye exhibits no discharge. No scleral icterus.  Cardiovascular: Normal rate, regular rhythm, normal heart sounds and intact distal pulses.  Exam reveals no gallop and no friction rub.   No murmur heard. Pulmonary/Chest: Effort normal and breath sounds normal. No respiratory distress. He has no wheezes. He has no rales. He exhibits no tenderness.  Musculoskeletal: Normal range of motion.  Neurological: He is alert and oriented to person, place, and time.  Skin: Skin is warm, dry and intact. No rash noted. He is not diaphoretic. No erythema. No pallor.  Psychiatric: He has a normal mood and affect. His speech is normal and behavior is normal. Judgment and thought content normal. Cognition and memory are normal.  Nursing note and vitals reviewed.   Results for orders placed or performed in visit on 09/25/15  Comprehensive metabolic panel  Result Value Ref Range   Glucose 168 (H) 65 - 99 mg/dL   BUN 6 (L) 8 - 27 mg/dL   Creatinine, Ser 0.76 0.76 - 1.27 mg/dL   GFR calc non Af Amer 90 >59 mL/min/1.73   GFR calc Af Amer 104 >59 mL/min/1.73   BUN/Creatinine Ratio 8 (L) 10 - 22   Sodium 140 134 - 144 mmol/L   Potassium 4.9 3.5 - 5.2 mmol/L   Chloride 100 96 - 106 mmol/L   CO2 28 18 - 29 mmol/L   Calcium  9.0 8.6 - 10.2 mg/dL   Total Protein 5.9 (L) 6.0 - 8.5 g/dL   Albumin 4.2 3.5 - 4.8 g/dL   Globulin, Total 1.7 1.5 - 4.5 g/dL   Albumin/Globulin Ratio 2.5 1.1 - 2.5   Bilirubin Total 0.4 0.0 - 1.2 mg/dL   Alkaline Phosphatase 113 39 - 117 IU/L   AST 53 (H) 0 - 40 IU/L   ALT 81 (H) 0 - 44 IU/L  Bayer DCA Hb A1c Waived  Result Value Ref Range   Bayer DCA Hb A1c Waived 6.8 <7.0 %  TSH  Result Value Ref Range   TSH 6.530 (H) 0.450 - 4.500 uIU/mL      Assessment & Plan:   Problem List Items Addressed This Visit      Endocrine   Hypothyroidism    Due for  recheck on his levels in 3 weeks, will return for lab visit at that time.       Relevant Orders   Thyroid Panel With TSH     Genitourinary   Benign hypertensive renal disease - Primary    Under good control today. Continue current regimen. Continue to monitor.       Relevant Medications   lisinopril (PRINIVIL,ZESTRIL) 10 MG tablet       Follow up plan: Return 3 weeks for labs, 2 months for DM visit.

## 2015-10-29 ENCOUNTER — Other Ambulatory Visit: Payer: Medicare Other

## 2015-10-29 DIAGNOSIS — E039 Hypothyroidism, unspecified: Secondary | ICD-10-CM | POA: Diagnosis not present

## 2015-10-30 ENCOUNTER — Other Ambulatory Visit: Payer: Self-pay | Admitting: Family Medicine

## 2015-10-30 ENCOUNTER — Telehealth: Payer: Self-pay | Admitting: Family Medicine

## 2015-10-30 DIAGNOSIS — E039 Hypothyroidism, unspecified: Secondary | ICD-10-CM

## 2015-10-30 LAB — THYROID PANEL WITH TSH
FREE THYROXINE INDEX: 2.4 (ref 1.2–4.9)
T3 UPTAKE RATIO: 27 % (ref 24–39)
T4 TOTAL: 9 ug/dL (ref 4.5–12.0)
TSH: 5.14 u[IU]/mL — ABNORMAL HIGH (ref 0.450–4.500)

## 2015-10-30 MED ORDER — LEVOTHYROXINE SODIUM 75 MCG PO TABS: 75.0000 ug | ORAL_TABLET | Freq: Every day | ORAL | Status: DC

## 2015-10-30 NOTE — Telephone Encounter (Signed)
Can you let [name] know that his thyroid was a little low still, we're going to increase his medicine and he should come back for a recheck in 6 weeks, labs only (please make the appointment for him but see if he wants [place] to send a letter so his son can put it on the calendar)

## 2015-10-30 NOTE — Telephone Encounter (Signed)
Patient notified

## 2015-11-03 ENCOUNTER — Ambulatory Visit: Payer: Medicare Other | Admitting: Family Medicine

## 2015-11-19 ENCOUNTER — Inpatient Hospital Stay: Payer: Medicare Other | Admitting: Oncology

## 2015-11-19 ENCOUNTER — Inpatient Hospital Stay: Payer: Medicare Other

## 2015-11-26 ENCOUNTER — Inpatient Hospital Stay (HOSPITAL_BASED_OUTPATIENT_CLINIC_OR_DEPARTMENT_OTHER): Payer: Medicare Other | Admitting: Oncology

## 2015-11-26 ENCOUNTER — Inpatient Hospital Stay: Payer: Medicare Other | Attending: Oncology

## 2015-11-26 VITALS — BP 152/75 | HR 76 | Temp 97.3°F | Resp 16 | Wt 190.5 lb

## 2015-11-26 DIAGNOSIS — I10 Essential (primary) hypertension: Secondary | ICD-10-CM | POA: Diagnosis not present

## 2015-11-26 DIAGNOSIS — F17210 Nicotine dependence, cigarettes, uncomplicated: Secondary | ICD-10-CM | POA: Diagnosis not present

## 2015-11-26 DIAGNOSIS — Z79899 Other long term (current) drug therapy: Secondary | ICD-10-CM | POA: Insufficient documentation

## 2015-11-26 DIAGNOSIS — D72819 Decreased white blood cell count, unspecified: Secondary | ICD-10-CM

## 2015-11-26 DIAGNOSIS — D61818 Other pancytopenia: Secondary | ICD-10-CM | POA: Diagnosis not present

## 2015-11-26 DIAGNOSIS — E119 Type 2 diabetes mellitus without complications: Secondary | ICD-10-CM

## 2015-11-26 LAB — CBC
HEMATOCRIT: 35.8 % — AB (ref 40.0–52.0)
HEMOGLOBIN: 12.6 g/dL — AB (ref 13.0–18.0)
MCH: 37.8 pg — AB (ref 26.0–34.0)
MCHC: 35.2 g/dL (ref 32.0–36.0)
MCV: 107.4 fL — ABNORMAL HIGH (ref 80.0–100.0)
Platelets: 147 10*3/uL — ABNORMAL LOW (ref 150–440)
RBC: 3.33 MIL/uL — ABNORMAL LOW (ref 4.40–5.90)
RDW: 12.3 % (ref 11.5–14.5)
WBC: 3.1 10*3/uL — ABNORMAL LOW (ref 3.8–10.6)

## 2015-11-26 LAB — IRON AND TIBC
Iron: 185 ug/dL — ABNORMAL HIGH (ref 45–182)
Saturation Ratios: 57 % — ABNORMAL HIGH (ref 17.9–39.5)
TIBC: 323 ug/dL (ref 250–450)
UIBC: 138 ug/dL

## 2015-11-26 LAB — FERRITIN: Ferritin: 726 ng/mL — ABNORMAL HIGH (ref 24–336)

## 2015-11-26 LAB — VITAMIN B12: VITAMIN B 12: 157 pg/mL — AB (ref 180–914)

## 2015-11-26 LAB — FOLATE: FOLATE: 15.5 ng/mL (ref >5.9)

## 2015-11-26 NOTE — Progress Notes (Signed)
Patient does not offer any problems today.  

## 2015-12-01 LAB — COMP PANEL: LEUKEMIA/LYMPHOMA

## 2015-12-06 NOTE — Progress Notes (Signed)
[facility]  [phone] [phone]) [phone]  ID: [name] OB: Mar 16, 1941  MR#: [id number]  INO#:[id number]  Patient Care Team: Valerie Roys, DO as PCP - General (Family Medicine)  CHIEF COMPLAINT:  Chief Complaint  Patient presents with  . Pancytopenia    INTERVAL HISTORY: Patient returns to clinic today for repeat laboratory work and further evaluation. He continues to feel well and is asymptomatic. He denies any fevers or illnesses. He denies any easy bleeding or bruising. He denies any weakness or fatigue. He denies any chest pain or shortness of breath. He has no nausea, vomiting, constipation, or diarrhea. He has no urinary complaints. Patient feels at his baseline and offers no specific complaints today.  REVIEW OF SYSTEMS:   Review of Systems  Constitutional: Negative.  Negative for fever and malaise/fatigue.  Respiratory: Negative.   Cardiovascular: Negative.   Gastrointestinal: Negative.   Musculoskeletal: Negative.   Neurological: Negative.  Negative for weakness.  Endo/Heme/Allergies: Does not bruise/bleed easily.    As per HPI. Otherwise, a complete review of systems is negatve.  PAST MEDICAL HISTORY: Past Medical History  Diagnosis Date  . Diabetes mellitus   . 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 Hypertension     PAST SURGICAL HISTORY: No past surgical history on file.  FAMILY HISTORY Family History  Problem Relation Age of Onset  . Heart disease Mother        ADVANCED DIRECTIVES:    HEALTH MAINTENANCE: Social History  Substance Use Topics  . Smoking status: Current Some Day Smoker -- 0.25 packs/day    Types: Cigarettes  . Smokeless tobacco: Current User    Types: Chew  . Alcohol Use: 0.0 oz/week    0 Standard drinks or equivalent per week     Comment: rarely     Colonoscopy:  PAP:  Bone density:  Lipid panel:  Allergies  Allergen Reactions  . Metformin  And Related Nausea And Vomiting    Current Outpatient Prescriptions  Medication Sig Dispense Refill  . levothyroxine (SYNTHROID, LEVOTHROID) 75 MCG tablet Take 1 tablet (75 mcg total) by mouth daily before breakfast. 30 tablet 1  . lisinopril (PRINIVIL,ZESTRIL) 10 MG tablet Take 0.5 tablets (5 mg total) by mouth daily. 90 tablet 1   No current facility-administered medications for this visit.    OBJECTIVE: Filed Vitals:   11/26/15 1140  BP: 152/75  Pulse: 76  Temp: 97.3 F (36.3 C)  Resp: 16     Body mass index is 28.64 kg/(m^2).    ECOG FS:0 - Asymptomatic  General: Well-developed, well-nourished, no acute distress. Eyes: Pink conjunctiva, anicteric sclera. Lungs: Clear to auscultation bilaterally. Heart: Regular rate and rhythm. No rubs, murmurs, or gallops. Abdomen: Soft, nontender, nondistended. No organomegaly noted, normoactive bowel sounds. Musculoskeletal: No edema, cyanosis, or clubbing. Neuro: Alert, answering all questions appropriately. Cranial nerves grossly intact. Skin: No rashes or petechiae noted. Psych: Normal affect.   LAB RESULTS:  Lab Results  Component Value Date   NA 140 09/25/2015   K 4.9 09/25/2015   CL 100 09/25/2015   CO2 28 09/25/2015   GLUCOSE 168* 09/25/2015   BUN 6* 09/25/2015   CREATININE 0.76 09/25/2015   CALCIUM 9.0 09/25/2015   PROT 5.9* 09/25/2015   ALBUMIN 4.2 09/25/2015   AST 53* 09/25/2015   ALT 81* 09/25/2015   ALKPHOS 113 09/25/2015   BILITOT 0.4 09/25/2015   GFRNONAA 90 09/25/2015   GFRAA  104 09/25/2015    Lab Results  Component Value Date   WBC 3.1* 11/26/2015   NEUTROABS 2.1 08/20/2015   HGB 12.6* 11/26/2015   HCT 35.8* 11/26/2015   MCV 107.4* 11/26/2015   PLT 147* 11/26/2015     STUDIES: No results found.  ASSESSMENT:  Pancytopenia.   PLAN:    1.  Leukopenia: Patient's white blood cell count is mildly decreased, but unchanged. Peripheral blood flow cytometry is within normal limits.  All of his other  labs were either negative or within normal limits. Suspect  leukopenia may be secondary to mild bone marrow suppression from patient's increased alcohol use. No intervention is needed at this time. Patient does not require bone marrow biopsy. Will consider abdominal ultrasound in the future to assess for splenomegaly. Return to clinic in 6 months for repeat laboratory work and further evaluation.  2. Anemia: Mild. Given patient's elevated MCV, patient has a mildly decreased B-12 level, he may benefit from oral B-12 supplementation. Iron stores and folate are within normal limits. 3. Thrombocytopenia: Mild., likely secondary to alcohol use. Monitor.  Patient expressed understanding and was in agreement with this plan. He also understands that He can call clinic at any time with any questions, concerns, or complaints.    Lloyd Huger, MD   12/06/2015 12:45 PM

## 2015-12-11 DIAGNOSIS — H2513 Age-related nuclear cataract, bilateral: Secondary | ICD-10-CM | POA: Diagnosis not present

## 2015-12-11 LAB — HM DIABETES EYE EXAM

## 2015-12-12 ENCOUNTER — Other Ambulatory Visit: Payer: Medicare Other

## 2015-12-12 DIAGNOSIS — E039 Hypothyroidism, unspecified: Secondary | ICD-10-CM | POA: Diagnosis not present

## 2015-12-13 LAB — TSH: TSH: 3.67 u[IU]/mL (ref 0.450–4.500)

## 2015-12-15 ENCOUNTER — Telehealth: Payer: Self-pay | Admitting: Family Medicine

## 2015-12-15 MED ORDER — LEVOTHYROXINE SODIUM 75 MCG PO TABS: 75.0000 ug | ORAL_TABLET | Freq: Every day | ORAL | Status: DC

## 2015-12-15 NOTE — Telephone Encounter (Signed)
Patient notified

## 2015-12-15 NOTE — Telephone Encounter (Signed)
Please let [name] know that his thyroid came back in the normal range- I've sent a year supply of his medicine over to his pharmacy for him. Thanks!

## 2016-01-13 ENCOUNTER — Ambulatory Visit (INDEPENDENT_AMBULATORY_CARE_PROVIDER_SITE_OTHER): Payer: Medicare Other | Admitting: Family Medicine

## 2016-01-13 ENCOUNTER — Encounter: Payer: Self-pay | Admitting: Family Medicine

## 2016-01-13 VITALS — BP 106/67 | HR 80 | Temp 98.3°F | Ht 68.0 in | Wt 192.0 lb

## 2016-01-13 DIAGNOSIS — E1140 Type 2 diabetes mellitus with diabetic neuropathy, unspecified: Secondary | ICD-10-CM | POA: Diagnosis not present

## 2016-01-13 DIAGNOSIS — I129 Hypertensive chronic kidney disease with stage 1 through stage 4 chronic kidney disease, or unspecified chronic kidney disease: Secondary | ICD-10-CM

## 2016-01-13 DIAGNOSIS — E1165 Type 2 diabetes mellitus with hyperglycemia: Secondary | ICD-10-CM

## 2016-01-13 DIAGNOSIS — IMO0002 Reserved for concepts with insufficient information to code with codable children: Secondary | ICD-10-CM

## 2016-01-13 DIAGNOSIS — E1142 Type 2 diabetes mellitus with diabetic polyneuropathy: Secondary | ICD-10-CM | POA: Diagnosis not present

## 2016-01-13 LAB — BAYER DCA HB A1C WAIVED: HB A1C (BAYER DCA - WAIVED): 8.1 % — ABNORMAL HIGH (ref <7.0)

## 2016-01-13 MED ORDER — EMPAGLIFLOZIN 10 MG PO TABS: 10.0000 mg | ORAL_TABLET | Freq: Every day | ORAL | Status: DC

## 2016-01-13 MED ORDER — LISINOPRIL 5 MG PO TABS: 5.0000 mg | ORAL_TABLET | Freq: Every day | ORAL | Status: DC

## 2016-01-13 NOTE — Progress Notes (Signed)
BP 106/67 mmHg  Pulse 80  Temp(Src) 98.3 F (36.8 C)  Ht 5\' 8"  (1.727 m)  Wt 192 lb (87.091 kg)  BMI 29.20 kg/m2  SpO2 97%   Subjective:    Patient ID: [name], male    DOB: 1941-04-14, 75 y.o.   MRN: [id number]  HPI: [name] is a 75 y.o. male  Chief Complaint  Patient presents with  . Diabetes   DIABETES- has not been eating all the  Hypoglycemic episodes:no Polydipsia/polyuria: yes Visual disturbance: no Chest pain: no Paresthesias: no Glucose Monitoring: no Taking Insulin?: no Blood Pressure Monitoring: not checking Retinal Examination: Up to Date Foot Exam: Up to Date Diabetic Education: Not Completed Pneumovax: Up to Date Influenza: Up to Date Aspirin: no  Relevant past medical, surgical, family and social history reviewed and updated as indicated. Interim medical history since our last visit reviewed. Allergies and medications reviewed and updated.  Review of Systems  Constitutional: Negative.   Respiratory: Negative.   Cardiovascular: Negative.   Psychiatric/Behavioral: Negative.     Per HPI unless specifically indicated above     Objective:    BP 106/67 mmHg  Pulse 80  Temp(Src) 98.3 F (36.8 C)  Ht 5\' 8"  (1.727 m)  Wt 192 lb (87.091 kg)  BMI 29.20 kg/m2  SpO2 97%  Wt Readings from Last 3 Encounters:  01/13/16 192 lb (87.091 kg)  11/26/15 190 lb 7.6 oz (86.4 kg)  10/15/15 189 lb (85.73 kg)    Physical Exam  Results for orders placed or performed in visit on 12/12/15  HM DIABETES EYE EXAM  Result Value Ref Range   HM Diabetic Eye Exam No Retinopathy No Retinopathy      Assessment & Plan:   Problem List Items Addressed This Visit      Endocrine   Type 2 diabetes, uncontrolled, 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 Allergic to metformin. Will start him on jardiance and recheck in 3 months.       Relevant Medications   lisinopril (PRINIVIL,ZESTRIL) 5 MG tablet   empagliflozin (JARDIANCE) 10 MG TABS  tablet   Other Relevant Orders   Bayer DCA Hb A1c Waived     Genitourinary   Benign hypertensive renal disease    BP running low. Taking whole pill- will change dose to make it easier for him. 10mg  kept today.      Relevant Medications   lisinopril (PRINIVIL,ZESTRIL) 5 MG tablet       Follow up plan: Return in about 3 months (around 04/13/2016) for DM visit with A1c.

## 2016-01-13 NOTE — Assessment & Plan Note (Signed)
BP running low. Taking whole pill- will change dose to make it easier for him. 10mg  kept today.

## 2016-01-13 NOTE — Assessment & Plan Note (Signed)
Not well controlled. Allergic to metformin. Will start him on jardiance and recheck in 3 months.

## 2016-02-17 ENCOUNTER — Inpatient Hospital Stay: Payer: Medicare Other | Admitting: Oncology

## 2016-02-17 ENCOUNTER — Inpatient Hospital Stay: Payer: Medicare Other

## 2016-02-24 DIAGNOSIS — B351 Tinea unguium: Secondary | ICD-10-CM | POA: Diagnosis not present

## 2016-02-24 DIAGNOSIS — E1142 Type 2 diabetes mellitus with diabetic polyneuropathy: Secondary | ICD-10-CM | POA: Diagnosis not present

## 2016-03-31 DIAGNOSIS — E119 Type 2 diabetes mellitus without complications: Secondary | ICD-10-CM | POA: Diagnosis not present

## 2016-04-14 ENCOUNTER — Ambulatory Visit (INDEPENDENT_AMBULATORY_CARE_PROVIDER_SITE_OTHER): Payer: Medicare Other | Admitting: Family Medicine

## 2016-04-14 ENCOUNTER — Encounter: Payer: Self-pay | Admitting: Family Medicine

## 2016-04-14 ENCOUNTER — Other Ambulatory Visit: Payer: Self-pay | Admitting: Family Medicine

## 2016-04-14 DIAGNOSIS — E1165 Type 2 diabetes mellitus with hyperglycemia: Principal | ICD-10-CM

## 2016-04-14 DIAGNOSIS — I129 Hypertensive chronic kidney disease with stage 1 through stage 4 chronic kidney disease, or unspecified chronic kidney disease: Secondary | ICD-10-CM | POA: Diagnosis not present

## 2016-04-14 DIAGNOSIS — IMO0002 Reserved for concepts with insufficient information to code with codable children: Secondary | ICD-10-CM

## 2016-04-14 DIAGNOSIS — E1140 Type 2 diabetes mellitus with diabetic neuropathy, unspecified: Secondary | ICD-10-CM

## 2016-04-14 LAB — LIPID PANEL PICCOLO, WAIVED
CHOLESTEROL PICCOLO, WAIVED: 165 mg/dL (ref <200)
Chol/HDL Ratio Piccolo,Waive: 4.5 mg/dL
HDL CHOL PICCOLO, WAIVED: 36 mg/dL — AB (ref >59)
LDL Chol Calc Piccolo Waived: 77 mg/dL (ref <100)
Triglycerides Piccolo,Waived: 258 mg/dL — ABNORMAL HIGH (ref <150)
VLDL Chol Calc Piccolo,Waive: 52 mg/dL — ABNORMAL HIGH (ref <30)

## 2016-04-14 LAB — BAYER DCA HB A1C WAIVED: HB A1C: 7.7 % — AB (ref <7.0)

## 2016-04-14 LAB — MICROALBUMIN, URINE WAIVED
Creatinine, Urine Waived: 50 mg/dL (ref 10–300)
Microalb, Ur Waived: 150 mg/L — ABNORMAL HIGH (ref 0–19)

## 2016-04-14 MED ORDER — EMPAGLIFLOZIN 10 MG PO TABS: 10.0000 mg | ORAL_TABLET | Freq: Every day | ORAL | Status: DC

## 2016-04-14 MED ORDER — LISINOPRIL 5 MG PO TABS: 5.0000 mg | ORAL_TABLET | Freq: Every day | ORAL | Status: DC

## 2016-04-14 NOTE — Assessment & Plan Note (Signed)
Under good control. Continue current regimen. Continue to monitor.  

## 2016-04-14 NOTE — Progress Notes (Signed)
BP 138/54 mmHg  Pulse 76  Temp(Src) 98.1 F (36.7 C)  Ht 5\' 8"  (1.727 m)  Wt 189 lb (85.73 kg)  BMI 28.74 kg/m2  SpO2 99%   Subjective:    Patient ID: [name], male    DOB: [birth year], 75 y.o.   MRN: [id number]  HPI: [name] is a 75 y.o. male  Chief Complaint  Patient presents with  . Hypertension  . Hyperlipidemia  . Diabetes   HYPERTENSION / HYPERLIPIDEMIA Satisfied with current treatment? yes Duration of hypertension: chronic BP monitoring frequency: not checking BP medication side effects: no Past BP meds: lisinopril Duration of hyperlipidemia: chronic Cholesterol medication side effects: Not on anything Medication compliance: good compliance Aspirin: no Recent stressors: no Recurrent headaches: no Visual changes: no Palpitations: no Dyspnea: no Chest pain: no Lower extremity edema: no Dizzy/lightheaded: no  DIABETES Hypoglycemic episodes:no Polydipsia/polyuria: no Visual disturbance: no Chest pain: no Paresthesias: no Glucose Monitoring: no Taking Insulin?: no Blood Pressure Monitoring: not checking Retinal Examination: Up to Date Foot Exam: Up to Date Diabetic Education: Not Completed Pneumovax: Up to Date Influenza: Up to Date Aspirin: no  Relevant past medical, surgical, family and social history reviewed and updated as indicated. Interim medical history since our last visit reviewed. Allergies and medications reviewed and updated.  Review of Systems  Constitutional: Negative.   Respiratory: Negative.   Cardiovascular: Negative.   Psychiatric/Behavioral: Negative.     Per HPI unless specifically indicated above     Objective:    BP 138/54 mmHg  Pulse 76  Temp(Src) 98.1 F (36.7 C)  Ht 5\' 8"  (1.727 m)  Wt 189 lb (85.73 kg)  BMI 28.74 kg/m2  SpO2 99%  Wt Readings from Last 3 Encounters:  04/14/16 189 lb (85.73 kg)  01/13/16 192 lb (87.091 kg)  11/26/15 190 lb 7.6 oz (86.4 kg)    Physical Exam  Constitutional: He is  oriented to person, place, and time. He appears well-developed and well-nourished. No distress.  HENT:  Head: Normocephalic and atraumatic.  Right Ear: Hearing normal.  Left Ear: Hearing normal.  Nose: Nose normal.  Eyes: Conjunctivae and lids are normal. Right eye exhibits no discharge. Left eye exhibits no discharge. No scleral icterus.  Cardiovascular: Normal rate, regular rhythm, normal heart sounds and intact distal pulses.  Exam reveals no gallop and no friction rub.   No murmur heard. Pulmonary/Chest: Effort normal and breath sounds normal. No respiratory distress. He has no wheezes. He has no rales. He exhibits no tenderness.  Musculoskeletal: Normal range of motion.  Neurological: He is alert and oriented to person, place, and time.  Skin: Skin is warm, dry and intact. No rash noted. No erythema. No pallor.  Psychiatric: He has a normal mood and affect. His speech is normal and behavior is normal. Judgment and thought content normal. Cognition and memory are normal.  Nursing note and vitals reviewed.   Diabetic Foot Exam - Simple   Simple Foot Form  Diabetic Foot exam was performed with the following findings:  Yes 04/14/2016  8:52 AM  Visual Inspection  No deformities, no ulcerations, no other skin breakdown bilaterally:  Yes  Sensation Testing  See comments:  Yes  Pulse Check  Posterior Tibialis and Dorsalis pulse intact bilaterally:  Yes  Comments  Decreased over L ball and heel, otherwise intact       Results for orders placed or performed in visit on 01/13/16  Bayer DCA Hb A1c Waived  Result Value Ref Range  Bayer DCA Hb A1c Waived 8.1 (H) <7.0 %      Assessment & Plan:   Problem List Items Addressed This Visit      Endocrine   Type 2 diabetes, uncontrolled, with neuropathy ([place])    A1c down to 7.7. Will leave jardiance alone as BP on lower side. Allergic to metformin. Recheck 3 months. Continue to monitor.       Relevant Medications   empagliflozin  (JARDIANCE) 10 MG TABS tablet   lisinopril (PRINIVIL,ZESTRIL) 5 MG tablet     Genitourinary   Benign hypertensive renal disease    Under good control. Continue current regimen. Continue to monitor.       Relevant Medications   lisinopril (PRINIVIL,ZESTRIL) 5 MG tablet       Follow up plan: Return in about 3 months (around 07/15/2016) for Wellness.

## 2016-04-14 NOTE — Assessment & Plan Note (Signed)
A1c down to 7.7. Will leave jardiance alone as BP on lower side. Allergic to metformin. Recheck 3 months. Continue to monitor.

## 2016-04-15 ENCOUNTER — Telehealth: Payer: Self-pay | Admitting: Family Medicine

## 2016-04-15 LAB — COMPREHENSIVE METABOLIC PANEL
A/G RATIO: 2.5 — AB (ref 1.2–2.2)
ALBUMIN: 4.3 g/dL (ref 3.5–4.8)
ALT: 84 IU/L — ABNORMAL HIGH (ref 0–44)
AST: 61 IU/L — ABNORMAL HIGH (ref 0–40)
Alkaline Phosphatase: 88 IU/L (ref 39–117)
BILIRUBIN TOTAL: 0.3 mg/dL (ref 0.0–1.2)
BUN / CREAT RATIO: 10 (ref 10–24)
BUN: 10 mg/dL (ref 8–27)
CHLORIDE: 97 mmol/L (ref 96–106)
CO2: 24 mmol/L (ref 18–29)
Calcium: 9 mg/dL (ref 8.6–10.2)
Creatinine, Ser: 1.05 mg/dL (ref 0.76–1.27)
GFR calc non Af Amer: 70 mL/min/{1.73_m2} (ref >59)
GFR, EST AFRICAN AMERICAN: 80 mL/min/{1.73_m2} (ref >59)
GLOBULIN, TOTAL: 1.7 g/dL (ref 1.5–4.5)
Glucose: 318 mg/dL — ABNORMAL HIGH (ref 65–99)
POTASSIUM: 5 mmol/L (ref 3.5–5.2)
Sodium: 137 mmol/L (ref 134–144)
TOTAL PROTEIN: 6 g/dL (ref 6.0–8.5)

## 2016-04-15 NOTE — Telephone Encounter (Signed)
Please let him know that his labs came back stable. Thanks! 

## 2016-04-15 NOTE — Telephone Encounter (Signed)
Spoke with patient.

## 2016-05-24 NOTE — Progress Notes (Signed)
[facility]  [phone] [phone]) [phone]  ID: [name] OB: 05/06/1941  MR#: [id number]  HYW#:[id number]  Patient Care Team: Valerie Roys, DO as PCP - General (Family Medicine)  CHIEF COMPLAINT: Pancytopenia  INTERVAL HISTORY: Patient returns to clinic today for repeat laboratory work and further evaluation. He continues to feel well and is asymptomatic. He denies any fevers or illnesses. He denies any easy bleeding or bruising. He denies any weakness or fatigue. He denies any chest pain or shortness of breath. He has no nausea, vomiting, constipation, or diarrhea. He has no urinary complaints. Patient feels at his baseline and offers no specific complaints today.  REVIEW OF SYSTEMS:   Review of Systems  Constitutional: Negative.  Negative for fever and malaise/fatigue.  Respiratory: Negative.  Negative for cough and shortness of breath.   Cardiovascular: Negative.  Negative for chest pain.  Gastrointestinal: Negative.  Negative for abdominal pain.  Musculoskeletal: Negative.   Neurological: Negative.  Negative for weakness.  Endo/Heme/Allergies: Does not bruise/bleed easily.  Psychiatric/Behavioral: Negative.     As per HPI. Otherwise, a complete review of systems is negatve.  PAST MEDICAL HISTORY: Past Medical History:  Diagnosis Date  . Diabetes mellitus   . 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 Hypertension     PAST SURGICAL HISTORY: No past surgical history on file.  FAMILY HISTORY Family History  Problem Relation Age of Onset  . Heart disease Mother        ADVANCED DIRECTIVES:    HEALTH MAINTENANCE: Social History  Substance Use Topics  . Smoking status: Current Some Day Smoker    Packs/day: 0.25    Types: Cigarettes  . Smokeless tobacco: Current User    Types: Chew  . Alcohol use 0.0 oz/week     Comment: rarely     Colonoscopy:  PAP:  Bone density:  Lipid  panel:  Allergies  Allergen Reactions  . Metformin And Related Nausea And Vomiting    Current Outpatient Prescriptions  Medication Sig Dispense Refill  . empagliflozin (JARDIANCE) 10 MG TABS tablet Take 10 mg by mouth daily. 90 tablet 1  . levothyroxine (SYNTHROID, LEVOTHROID) 75 MCG tablet Take 1 tablet (75 mcg total) by mouth daily before breakfast. 90 tablet 3  . lisinopril (PRINIVIL,ZESTRIL) 5 MG tablet Take 1 tablet (5 mg total) by mouth daily. 90 tablet 1   No current facility-administered medications for this visit.     OBJECTIVE: Vitals:   05/25/16 1003  BP: 113/63  Pulse: 79  Resp: 18  Temp: 97.9 F (36.6 C)     Body mass index is 28.76 kg/m.    ECOG FS:0 - Asymptomatic  General: Well-developed, well-nourished, no acute distress. Eyes: Pink conjunctiva, anicteric sclera. Lungs: Clear to auscultation bilaterally. Heart: Regular rate and rhythm. No rubs, murmurs, or gallops. Abdomen: Soft, nontender, nondistended. No organomegaly noted, normoactive bowel sounds. Musculoskeletal: No edema, cyanosis, or clubbing. Neuro: Alert, answering all questions appropriately. Cranial nerves grossly intact. Skin: No rashes or petechiae noted. Psych: Normal affect.   LAB RESULTS:  Lab Results  Component Value Date   NA 137 04/14/2016   K 5.0 04/14/2016   CL 97 04/14/2016   CO2 24 04/14/2016   GLUCOSE 318 (H) 04/14/2016   BUN 10 04/14/2016   CREATININE 1.05 04/14/2016   CALCIUM 9.0 04/14/2016   PROT 6.0 04/14/2016   ALBUMIN 4.3 04/14/2016   AST 61 (H) 04/14/2016   ALT 84 (  H) 04/14/2016   ALKPHOS 88 04/14/2016   BILITOT 0.3 04/14/2016   GFRNONAA 70 04/14/2016   GFRAA 80 04/14/2016    Lab Results  Component Value Date   WBC 3.3 (L) 05/25/2016   NEUTROABS 2.5 05/25/2016   HGB 12.2 (L) 05/25/2016   HCT 34.4 (L) 05/25/2016   MCV 107.9 (H) 05/25/2016   PLT 117 (L) 05/25/2016     STUDIES: No results found.  ASSESSMENT:  Pancytopenia.   PLAN:    1.   Leukopenia: Patient's white blood cell count is mildly decreased, but unchanged. Peripheral blood flow cytometry is within normal limits.  All of his other labs were either negative or within normal limits. Suspect leukopenia may be secondary to mild bone marrow suppression from patient's increased alcohol use. No intervention is needed at this time. Patient does not require bone marrow biopsy. Will consider abdominal ultrasound in the future to assess for splenomegaly. Return to clinic in 6 months for repeat laboratory work and further evaluation. If patient's laboratory work remains stable at that time, he likely can be discharged from clinic. 2. Anemia: Mild. Given patient's elevated MCV, patient has a mildly decreased B-12 level, he may benefit from oral B-12 supplementation. Iron stores and folate are within normal limits. 3. Thrombocytopenia: Mild., likely secondary to alcohol use. Monitor.  Patient expressed understanding and was in agreement with this plan. He also understands that He can call clinic at any time with any questions, concerns, or complaints.    Martin Huger, MD   05/27/2016 8:25 AM

## 2016-05-25 ENCOUNTER — Inpatient Hospital Stay: Payer: Medicare Other

## 2016-05-25 ENCOUNTER — Inpatient Hospital Stay: Payer: Medicare Other | Attending: Oncology | Admitting: Oncology

## 2016-05-25 VITALS — BP 113/63 | HR 79 | Temp 97.9°F | Resp 18 | Wt 189.2 lb

## 2016-05-25 DIAGNOSIS — I10 Essential (primary) hypertension: Secondary | ICD-10-CM | POA: Diagnosis not present

## 2016-05-25 DIAGNOSIS — D72819 Decreased white blood cell count, unspecified: Secondary | ICD-10-CM

## 2016-05-25 DIAGNOSIS — Z79899 Other long term (current) drug therapy: Secondary | ICD-10-CM | POA: Diagnosis not present

## 2016-05-25 DIAGNOSIS — E119 Type 2 diabetes mellitus without complications: Secondary | ICD-10-CM | POA: Diagnosis not present

## 2016-05-25 DIAGNOSIS — D61818 Other pancytopenia: Secondary | ICD-10-CM

## 2016-05-25 DIAGNOSIS — E538 Deficiency of other specified B group vitamins: Secondary | ICD-10-CM

## 2016-05-25 DIAGNOSIS — F17210 Nicotine dependence, cigarettes, uncomplicated: Secondary | ICD-10-CM | POA: Diagnosis not present

## 2016-05-25 LAB — CBC WITH DIFFERENTIAL/PLATELET
Basophils Absolute: 0 10*3/uL (ref 0–0.1)
Basophils Relative: 0 %
Eosinophils Absolute: 0 10*3/uL (ref 0–0.7)
Eosinophils Relative: 1 %
HCT: 34.4 % — ABNORMAL LOW (ref 40.0–52.0)
HEMOGLOBIN: 12.2 g/dL — AB (ref 13.0–18.0)
LYMPHS ABS: 0.5 10*3/uL — AB (ref 1.0–3.6)
LYMPHS PCT: 14 %
MCH: 38.3 pg — AB (ref 26.0–34.0)
MCHC: 35.5 g/dL (ref 32.0–36.0)
MCV: 107.9 fL — AB (ref 80.0–100.0)
MONOS PCT: 8 %
Monocytes Absolute: 0.3 10*3/uL (ref 0.2–1.0)
NEUTROS PCT: 77 %
Neutro Abs: 2.5 10*3/uL (ref 1.4–6.5)
Platelets: 117 10*3/uL — ABNORMAL LOW (ref 150–440)
RBC: 3.19 MIL/uL — AB (ref 4.40–5.90)
RDW: 13.2 % (ref 11.5–14.5)
WBC: 3.3 10*3/uL — AB (ref 3.8–10.6)

## 2016-05-25 NOTE — Progress Notes (Signed)
States is feeling well. Offers no complaints. 

## 2016-07-05 ENCOUNTER — Encounter (INDEPENDENT_AMBULATORY_CARE_PROVIDER_SITE_OTHER): Payer: Self-pay

## 2016-07-16 ENCOUNTER — Encounter: Payer: Medicare Other | Admitting: Family Medicine

## 2016-08-09 ENCOUNTER — Encounter (INDEPENDENT_AMBULATORY_CARE_PROVIDER_SITE_OTHER): Payer: Self-pay

## 2016-08-23 ENCOUNTER — Encounter: Payer: Medicare Other | Admitting: Family Medicine

## 2016-08-24 DIAGNOSIS — H2513 Age-related nuclear cataract, bilateral: Secondary | ICD-10-CM | POA: Diagnosis not present

## 2016-08-26 DIAGNOSIS — B351 Tinea unguium: Secondary | ICD-10-CM | POA: Diagnosis not present

## 2016-08-26 DIAGNOSIS — E1142 Type 2 diabetes mellitus with diabetic polyneuropathy: Secondary | ICD-10-CM | POA: Diagnosis not present

## 2016-09-07 ENCOUNTER — Encounter: Payer: Medicare Other | Admitting: Family Medicine

## 2016-09-21 ENCOUNTER — Encounter: Payer: Self-pay | Admitting: Family Medicine

## 2016-09-21 ENCOUNTER — Ambulatory Visit (INDEPENDENT_AMBULATORY_CARE_PROVIDER_SITE_OTHER): Payer: Medicare Other | Admitting: Family Medicine

## 2016-09-21 VITALS — BP 144/68 | HR 85 | Temp 98.7°F | Ht 67.5 in | Wt 191.4 lb

## 2016-09-21 DIAGNOSIS — E1165 Type 2 diabetes mellitus with hyperglycemia: Secondary | ICD-10-CM | POA: Diagnosis not present

## 2016-09-21 DIAGNOSIS — Z0001 Encounter for general adult medical examination with abnormal findings: Secondary | ICD-10-CM

## 2016-09-21 DIAGNOSIS — I129 Hypertensive chronic kidney disease with stage 1 through stage 4 chronic kidney disease, or unspecified chronic kidney disease: Secondary | ICD-10-CM

## 2016-09-21 DIAGNOSIS — IMO0002 Reserved for concepts with insufficient information to code with codable children: Secondary | ICD-10-CM

## 2016-09-21 DIAGNOSIS — Z125 Encounter for screening for malignant neoplasm of prostate: Secondary | ICD-10-CM

## 2016-09-21 DIAGNOSIS — D61818 Other pancytopenia: Secondary | ICD-10-CM

## 2016-09-21 DIAGNOSIS — Z1211 Encounter for screening for malignant neoplasm of colon: Secondary | ICD-10-CM

## 2016-09-21 DIAGNOSIS — E1122 Type 2 diabetes mellitus with diabetic chronic kidney disease: Secondary | ICD-10-CM | POA: Diagnosis not present

## 2016-09-21 DIAGNOSIS — N181 Chronic kidney disease, stage 1: Secondary | ICD-10-CM

## 2016-09-21 DIAGNOSIS — E1140 Type 2 diabetes mellitus with diabetic neuropathy, unspecified: Secondary | ICD-10-CM | POA: Diagnosis not present

## 2016-09-21 DIAGNOSIS — E039 Hypothyroidism, unspecified: Secondary | ICD-10-CM

## 2016-09-21 DIAGNOSIS — Z0000 Encounter for general adult medical examination without abnormal findings: Secondary | ICD-10-CM

## 2016-09-21 LAB — MICROALBUMIN, URINE WAIVED
Creatinine, Urine Waived: 50 mg/dL (ref 10–300)
Microalb, Ur Waived: 150 mg/L — ABNORMAL HIGH (ref 0–19)
Microalb/Creat Ratio: >300 mg/g — ABNORMAL HIGH (ref <30)

## 2016-09-21 LAB — UA/M W/RFLX CULTURE, ROUTINE
BILIRUBIN UA: NEGATIVE
KETONES UA: NEGATIVE
Leukocytes, UA: NEGATIVE
NITRITE UA: NEGATIVE
PH UA: 5.5 (ref 5.0–7.5)
Urobilinogen, Ur: 0.2 mg/dL (ref 0.2–1.0)

## 2016-09-21 LAB — MICROSCOPIC EXAMINATION

## 2016-09-21 LAB — HEMOGLOBIN A1C: Hemoglobin A1C: 8.1

## 2016-09-21 LAB — BAYER DCA HB A1C WAIVED: HB A1C: 8.1 % — AB (ref <7.0)

## 2016-09-21 MED ORDER — EMPAGLIFLOZIN 10 MG PO TABS: 10.0000 mg | ORAL_TABLET | Freq: Every day | ORAL | 1 refills | Status: DC

## 2016-09-21 MED ORDER — LISINOPRIL 5 MG PO TABS: 5.0000 mg | ORAL_TABLET | Freq: Every day | ORAL | 1 refills | Status: DC

## 2016-09-21 NOTE — Patient Instructions (Addendum)
Preventative Services:  Health Risk Assessment and Personalized Prevention Plan: Done today Bone Mass Measurements: N/A CVD Screening: Done today Colon Cancer Screening: Will do cologuard Depression Screening: Done today Diabetes Screening: Done today Glaucoma Screening: See your eye doctor Hepatitis B vaccine: A/A Hepatitis C screening: up to date  HIV Screening: up to date Flu Vaccine: Declined Lung cancer Screening: N/A Obesity Screening: Done today Pneumonia Vaccines (2): Declined STI Screening: N/A PSA screening: Done today  Health Maintenance, Male A healthy lifestyle and preventative care can promote health and wellness.  Maintain regular health, dental, and eye exams.  Eat a healthy diet. Foods like vegetables, fruits, whole grains, low-fat dairy products, and lean protein foods contain the nutrients you need and are low in calories. Decrease your intake of foods high in solid fats, added sugars, and salt. Get information about a proper diet from your health care provider, if necessary.  Regular physical exercise is one of the most important things you can do for your health. Most adults should get at least 150 minutes of moderate-intensity exercise (any activity that increases your heart rate and causes you to sweat) each week. In addition, most adults need muscle-strengthening exercises on 2 or more days a week.   Maintain a healthy weight. The body mass index (BMI) is a screening tool to identify possible weight problems. It provides an estimate of body fat based on height and weight. Your health care provider can find your BMI and can help you achieve or maintain a healthy weight. For males 20 years and older:  A BMI below 18.5 is considered underweight.  A BMI of 18.5 to 24.9 is normal.  A BMI of 25 to 29.9 is considered overweight.  A BMI of 30 and above is considered obese.  Maintain normal blood lipids and cholesterol by exercising and minimizing your intake of  saturated fat. Eat a balanced diet with plenty of fruits and vegetables. Blood tests for lipids and cholesterol should begin at age 36 and be repeated every 5 years. If your lipid or cholesterol levels are high, you are over age 95, or you are at high risk for heart disease, you may need your cholesterol levels checked more frequently.Ongoing high lipid and cholesterol levels should be treated with medicines if diet and exercise are not working.  If you smoke, find out from your health care provider how to quit. If you do not use tobacco, do not start.  Lung cancer screening is recommended for adults aged 49-80 years who are at high risk for developing lung cancer because of a history of smoking. A yearly low-dose CT scan of the lungs is recommended for people who have at least a 30-pack-year history of smoking and are current smokers or have quit within the past 15 years. A pack year of smoking is smoking an average of 1 pack of cigarettes a day for 1 year (for example, a 30-pack-year history of smoking could mean smoking 1 pack a day for 30 years or 2 packs a day for 15 years). Yearly screening should continue until the smoker has stopped smoking for at least 15 years. Yearly screening should be stopped for people who develop a health problem that would prevent them from having lung cancer treatment.  If you choose to drink alcohol, do not have more than 2 drinks per day. One drink is considered to be 12 oz (360 mL) of beer, 5 oz (150 mL) of wine, or 1.5 oz (45 mL) of liquor.  Avoid the use of street drugs. Do not share needles with anyone. Ask for help if you need support or instructions about stopping the use of drugs.  High blood pressure causes heart disease and increases the risk of stroke. High blood pressure is more likely to develop in:  People who have blood pressure in the end of the normal range (100-139/85-89 mm Hg).  People who are overweight or obese.  People who are African  American.  If you are 41-63 years of age, have your blood pressure checked every 3-5 years. If you are 2 years of age or older, have your blood pressure checked every year. You should have your blood pressure measured twice-once when you are at a hospital or clinic, and once when you are not at a hospital or clinic. Record the average of the two measurements. To check your blood pressure when you are not at a hospital or clinic, you can use:  An automated blood pressure machine at a pharmacy.  A home blood pressure monitor.  If you are 51-39 years old, ask your health care provider if you should take aspirin to prevent heart disease.  Diabetes screening involves taking a blood sample to check your fasting blood sugar level. This should be done once every 3 years after age 80 if you are at a normal weight and without risk factors for diabetes. Testing should be considered at a younger age or be carried out more frequently if you are overweight and have at least 1 risk factor for diabetes.  Colorectal cancer can be detected and often prevented. Most routine colorectal cancer screening begins at the age of 59 and continues through age 103. However, your health care provider may recommend screening at an earlier age if you have risk factors for colon cancer. On a yearly basis, your health care provider may provide home test kits to check for hidden blood in the stool. A small camera at the end of a tube may be used to directly examine the colon (sigmoidoscopy or colonoscopy) to detect the earliest forms of colorectal cancer. Talk to your health care provider about this at age 41 when routine screening begins. A direct exam of the colon should be repeated every 5-10 years through age 73, unless early forms of precancerous polyps or small growths are found.  People who are at an increased risk for hepatitis B should be screened for this virus. You are considered at high risk for hepatitis B if:  You were  born in a country where hepatitis B occurs often. Talk with your health care provider about which countries are considered high risk.  Your parents were born in a high-risk country and you have not received a shot to protect against hepatitis B (hepatitis B vaccine).  You have HIV or AIDS.  You use needles to inject street drugs.  You live with, or have sex with, someone who has hepatitis B.  You are a man who has sex with other men (MSM).  You get hemodialysis treatment.  You take certain medicines for conditions like cancer, organ transplantation, and autoimmune conditions.  Hepatitis C blood testing is recommended for all people born from [birth year] through 1965 and any individual with known risk factors for hepatitis C.  Healthy men should no longer receive prostate-specific antigen (PSA) blood tests as part of routine cancer screening. Talk to your health care provider about prostate cancer screening.  Testicular cancer screening is not recommended for adolescents or adult males who  have no symptoms. Screening includes self-exam, a health care provider exam, and other screening tests. Consult with your health care provider about any symptoms you have or any concerns you have about testicular cancer.  Practice safe sex. Use condoms and avoid high-risk sexual practices to reduce the spread of sexually transmitted infections (STIs).  You should be screened for STIs, including gonorrhea and chlamydia if:  You are sexually active and are younger than 24 years.  You are older than 24 years, and your health care provider tells you that you are at risk for this type of infection.  Your sexual activity has changed since you were last screened, and you are at an increased risk for chlamydia or gonorrhea. Ask your health care provider if you are at risk.  If you are at risk of being infected with HIV, it is recommended that you take a prescription medicine daily to prevent HIV infection. This is  called pre-exposure prophylaxis (PrEP). You are considered at risk if:  You are a man who has sex with other men (MSM).  You are a heterosexual man who is sexually active with multiple partners.  You take drugs by injection.  You are sexually active with a partner who has HIV.  Talk with your health care provider about whether you are at high risk of being infected with HIV. If you choose to begin PrEP, you should first be tested for HIV. You should then be tested every 3 months for as long as you are taking PrEP.  Use sunscreen. Apply sunscreen liberally and repeatedly throughout the day. You should seek shade when your shadow is shorter than you. Protect yourself by wearing long sleeves, pants, a wide-brimmed hat, and sunglasses year round whenever you are outdoors.  Tell your health care provider of new moles or changes in moles, especially if there is a change in shape or color. Also, tell your health care provider if a mole is larger than the size of a pencil eraser.  A one-time screening for abdominal aortic aneurysm (AAA) and surgical repair of large AAAs by ultrasound is recommended for men aged 3-75 years who are current or former smokers.  Stay current with your vaccines (immunizations). This information is not intended to replace advice given to you by your health care provider. Make sure you discuss any questions you have with your health care provider. Document Released: 03/25/2008 Document Revised: 10/18/2014 Document Reviewed: 07/01/2015 Elsevier Interactive Patient Education  2017 [employer].

## 2016-09-21 NOTE — Addendum Note (Signed)
Addended by: Valerie Roys on: 09/21/2016 02:14 PM   Modules accepted: Orders

## 2016-09-21 NOTE — Assessment & Plan Note (Signed)
Better on recheck. Continue current regimen. Call with any concerns.  

## 2016-09-21 NOTE — Assessment & Plan Note (Addendum)
Under good control with A1c at 8.1. Call with any concerns. Continue to monitor.

## 2016-09-21 NOTE — Assessment & Plan Note (Signed)
Rechecking levels today. Await results. Call with any concerns. Adjust dose as needed.  

## 2016-09-21 NOTE — Progress Notes (Signed)
BP (!) 144/68 (BP Location: Left Arm, Cuff Size: Normal)   Pulse 85   Temp 98.7 F (37.1 C)   Ht 5' 7.5" (1.715 m)   Wt 191 lb 6.4 oz (86.8 kg)   SpO2 97%   BMI 29.54 kg/m    Subjective:    Patient ID: [name], male    DOB: November 19, 1940, 75 y.o.   MRN: [id number]  HPI: [name] is a 75 y.o. male presenting on 09/21/2016 for comprehensive medical examination. Current medical complaints include:  DIABETES Hypoglycemic episodes:no Polydipsia/polyuria: yes Visual disturbance: no Chest pain: no Paresthesias: yes Glucose Monitoring: no Taking Insulin?: No Blood Pressure Monitoring: not checking Retinal Examination: Seeing them tomorrow Foot Exam: Up to Date Diabetic Education: Completed Pneumovax: Refused Influenza: Refused Aspirin: no  HYPOTHYROIDISM Thyroid control status:controlled Satisfied with current treatment? yes Medication side effects: yes Medication compliance: excellent compliance Recent dose adjustment:no Fatigue: no Cold intolerance: no Heat intolerance: no Weight gain: no Weight loss: no Constipation: no Diarrhea/loose stools: yes Palpitations: no Lower extremity edema: no Anxiety/depressed mood: no  HYPERTENSION Hypertension status: stable  Satisfied with current treatment? yes Duration of hypertension: chronic BP monitoring frequency:  not checking BP medication side effects:  no Medication compliance: excellent compliance Aspirin: no Recurrent headaches: no Visual changes: no Palpitations: no Dyspnea: no Chest pain: no Lower extremity edema: no Dizzy/lightheaded: no  He currently lives with: with his son Interim Problems from his last visit: no  Functional Status Survey: Is the patient deaf or have difficulty hearing?: No Does the patient have difficulty seeing, even when wearing glasses/contacts?: Yes Does the patient have difficulty concentrating, remembering, or making decisions?: No Does the patient have difficulty  walking or climbing stairs?: Yes Does the patient have difficulty dressing or bathing?: No Does the patient have difficulty doing errands alone such as visiting a doctor's office or shopping?: No  FALL RISK: Fall Risk  09/21/2016 04/14/2016  Falls in the past year? No No    Depression Screen Depression screen [facility] 2/9 09/21/2016 04/14/2016  Decreased Interest 0 0  Down, Depressed, Hopeless 0 0  PHQ - 2 Score 0 0    Advanced Directives See appropriate area of the chart  Past Medical History:  Past Medical History:  Diagnosis Date  . Diabetes mellitus   . 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 Hypertension     Surgical History:  History reviewed. No pertinent surgical history.  Medications:  Current Outpatient Prescriptions on File Prior to Visit  Medication Sig  . empagliflozin (JARDIANCE) 10 MG TABS tablet Take 10 mg by mouth daily.  Marland Kitchen levothyroxine (SYNTHROID, LEVOTHROID) 75 MCG tablet Take 1 tablet (75 mcg total) by mouth daily before breakfast.  . lisinopril (PRINIVIL,ZESTRIL) 5 MG tablet Take 1 tablet (5 mg total) by mouth daily.   No current facility-administered medications on file prior to visit.     Allergies:  Allergies  Allergen Reactions  . Metformin And Related Nausea And Vomiting    Social History:  Social History   Social History  . Marital status: Married    Spouse name: N/A  . Number of children: 2  . Years of education: N/A   Occupational History  . Custodian    Social History Main Topics  . Smoking status: Former Smoker    Packs/day: 0.25    Types: Cigarettes    Quit date: 03/22/2016  . Smokeless tobacco: Current User  Types: Chew  . Alcohol use 0.0 oz/week     Comment: rarely  . Drug use: No  . Sexual activity: Not on file     Comment: currently trying to quit now   Other Topics Concern  . Not on file   Social History Narrative  . No narrative on file   History  Smoking Status  . Former  Smoker  . Packs/day: 0.25  . Types: Cigarettes  . Quit date: 03/22/2016  Smokeless Tobacco  . Current User  . Types: Chew   History  Alcohol Use  . 0.0 oz/week    Comment: rarely    Family History:  Family History  Problem Relation Age of Onset  . Heart disease Mother     Past medical history, surgical history, medications, allergies, family history and social history reviewed with patient today and changes made to appropriate areas of the chart.   Review of Systems  Constitutional: Negative.   HENT: Negative.   Eyes: Positive for blurred vision. Negative for double vision, photophobia, pain, discharge and redness.  Respiratory: Negative.   Cardiovascular: Negative.   Gastrointestinal: Positive for diarrhea and vomiting. Negative for abdominal pain, blood in stool, constipation, heartburn, melena and nausea.  Genitourinary: Positive for frequency. Negative for dysuria, flank pain, hematuria and urgency.  Skin: Negative.   Neurological: Negative.   Endo/Heme/Allergies: Negative.  Negative for environmental allergies and polydipsia. Does not bruise/bleed easily.  Psychiatric/Behavioral: Negative.     All other ROS negative except what is listed above and in the HPI.      Objective:    BP (!) 144/68 (BP Location: Left Arm, Cuff Size: Normal)   Pulse 85   Temp 98.7 F (37.1 C)   Ht 5' 7.5" (1.715 m)   Wt 191 lb 6.4 oz (86.8 kg)   SpO2 97%   BMI 29.54 kg/m   Wt Readings from Last 3 Encounters:  09/21/16 191 lb 6.4 oz (86.8 kg)  05/25/16 189 lb 2.5 oz (85.8 kg)  04/14/16 189 lb (85.7 kg)    Physical Exam  Constitutional: He is oriented to person, place, and time. He appears well-developed and well-nourished. No distress.  HENT:  Head: Normocephalic and atraumatic.  Right Ear: Hearing, tympanic membrane, external ear and ear canal normal.  Left Ear: Hearing, tympanic membrane, external ear and ear canal normal.  Nose: Nose normal.  Mouth/Throat: Oropharynx is  clear and moist. No oropharyngeal exudate.  Eyes: Conjunctivae, EOM and lids are normal. Pupils are equal, round, and reactive to light. Right eye exhibits no discharge. Left eye exhibits no discharge. No scleral icterus.  Neck: Normal range of motion. Neck supple. No JVD present. No tracheal deviation present. No thyromegaly present.  Cardiovascular: Normal rate, regular rhythm, normal heart sounds and intact distal pulses.  Exam reveals no gallop and no friction rub.   No murmur heard. Pulmonary/Chest: Effort normal and breath sounds normal. No stridor. No respiratory distress. He has no wheezes. He has no rales. He exhibits no tenderness.  Abdominal: Soft. Bowel sounds are normal. He exhibits no distension and no mass. There is no tenderness. There is no rebound and no guarding.  Genitourinary:  Genitourinary Comments: Deferred with shared decision making  Musculoskeletal: Normal range of motion. He exhibits no edema, tenderness or deformity.  Lymphadenopathy:    He has no cervical adenopathy.  Neurological: He is alert and oriented to person, place, and time. He has normal reflexes. He displays normal reflexes. No cranial nerve deficit. He exhibits  normal muscle tone. Coordination normal.  Skin: Skin is warm, dry and intact. No rash noted. He is not diaphoretic. No erythema. No pallor.  Psychiatric: He has a normal mood and affect. His speech is normal and behavior is normal. Judgment and thought content normal. Cognition and memory are normal.  Nursing note and vitals reviewed.    Results for orders placed or performed in visit on 09/21/16  Hemoglobin A1c  Result Value Ref Range   Hemoglobin A1C 8.1       Assessment & Plan:   Problem List Items Addressed This Visit      Endocrine   Type 2 diabetes, uncontrolled, with neuropathy ([place])    Under good control with A1c at 8.1. Call with any concerns. Continue to monitor.       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 Continue to monitor. Call with any concerns.      Relevant Medications   aspirin EC 81 MG tablet   Other Relevant Orders   Comprehensive metabolic panel   Hypothyroidism    Rechecking levels today. Await results. Call with any concerns. Adjust dose as needed.       Relevant Orders   Comprehensive metabolic panel   TSH     Genitourinary   Benign hypertensive renal disease    Better on recheck. Continue current regimen. Call with any concerns.       Relevant Orders   Comprehensive metabolic panel   Microalbumin, Urine Waived     Hematopoietic and Hemostatic   Pancytopenia (HCC)    Rechecking levels today. Continue to follow with hematology. Call with any concerns.       Relevant Orders   CBC with Differential/Platelet   Comprehensive metabolic panel    Other Visit Diagnoses    Medicare annual wellness visit, subsequent    -  Primary   Declines vaccines. Labs checked as below. Preventative care discussed as below. Call with any concerns.    Screening for prostate cancer       Screening labs checked today.   Relevant Orders   PSA       Preventative Services:  Health Risk Assessment and Personalized Prevention Plan: Done today Bone Mass Measurements: N/A CVD Screening: Done today Colon Cancer Screening: Will do cologuard Depression Screening: Done today Diabetes Screening: Done today Glaucoma Screening: See your eye doctor Hepatitis B vaccine: A/A Hepatitis C screening: up to date  HIV Screening: up to date Flu Vaccine: Declined Lung cancer Screening: N/A Obesity Screening: Done today Pneumonia Vaccines (2): Declined STI Screening: N/A PSA screening: Done today  Discussed aspirin prophylaxis for myocardial  infarction prevention and decision was it was not indicated  LABORATORY TESTING:  Health maintenance labs ordered today as discussed above.   The natural history of prostate cancer and ongoing controversy regarding screening and potential treatment outcomes of prostate cancer has been discussed with the patient. The meaning of a false positive PSA and a false negative PSA has been discussed. He indicates understanding of the limitations of this screening test and wishes to proceed with screening PSA testing.   IMMUNIZATIONS:   - Tdap: Tetanus vaccination status reviewed: last tetanus booster within  10 years. - Influenza: Refused - Pneumovax: Refused - Prevnar: Refused - Zostavax vaccine: Refused  SCREENING: - Colonoscopy: Refused  Discussed with patient purpose of the colonoscopy is to detect colon cancer at curable precancerous or early stages   -Hearing Test: Ordered today  -Spirometry: Not applicable   PATIENT COUNSELING:    Sexuality: Discussed sexually transmitted diseases, partner selection, use of condoms, avoidance of unintended pregnancy  and contraceptive alternatives.   Advised to avoid cigarette smoking.  I discussed with the patient that most people either abstain from alcohol or drink within safe limits (<=14/week and <=4 drinks/occasion for males, <=7/weeks and <= 3 drinks/occasion for females) and that the risk for alcohol disorders and other health effects rises proportionally with the number of drinks per week and how often a drinker exceeds daily limits.  Discussed cessation/primary prevention of drug use and availability of treatment for abuse.   Diet: Encouraged to adjust caloric intake to maintain  or achieve ideal body weight, to reduce intake of dietary saturated fat and total fat, to limit sodium intake by avoiding high sodium foods and not adding table salt, and to maintain adequate dietary potassium and calcium preferably from fresh fruits, vegetables, and  low-fat dairy products.    stressed the importance of regular exercise  Injury prevention: Discussed safety belts, safety helmets, smoke detector, smoking near bedding or upholstery.   Dental health: Discussed importance of regular tooth brushing, flossing, and dental visits.   Follow up plan: NEXT PREVENTATIVE PHYSICAL DUE IN 1 YEAR. Return in about 3 months (around 12/20/2016) for Follow up DM.

## 2016-09-21 NOTE — Assessment & Plan Note (Signed)
Rechecking levels today. Continue to follow with hematology. Call with any concerns.  

## 2016-09-21 NOTE — Assessment & Plan Note (Signed)
Rechecking levels today. Continue to monitor. Call with any concerns.  

## 2016-09-22 ENCOUNTER — Telehealth: Payer: Self-pay | Admitting: Family Medicine

## 2016-09-22 LAB — CBC WITH DIFFERENTIAL/PLATELET
BASOS ABS: 0 10*3/uL (ref 0.0–0.2)
Basos: 0 %
EOS (ABSOLUTE): 0 10*3/uL (ref 0.0–0.4)
Eos: 1 %
HEMOGLOBIN: 11.8 g/dL — AB (ref 13.0–17.7)
Hematocrit: 34.3 % — ABNORMAL LOW (ref 37.5–51.0)
IMMATURE GRANS (ABS): 0 10*3/uL (ref 0.0–0.1)
Immature Granulocytes: 1 %
LYMPHS: 16 %
Lymphocytes Absolute: 0.6 10*3/uL — ABNORMAL LOW (ref 0.7–3.1)
MCH: 37.5 pg — ABNORMAL HIGH (ref 26.6–33.0)
MCHC: 34.4 g/dL (ref 31.5–35.7)
MCV: 109 fL — ABNORMAL HIGH (ref 79–97)
MONOCYTES: 8 %
Monocytes Absolute: 0.3 10*3/uL (ref 0.1–0.9)
NEUTROS PCT: 74 %
Neutrophils Absolute: 2.7 10*3/uL (ref 1.4–7.0)
Platelets: 115 10*3/uL — ABNORMAL LOW (ref 150–379)
RBC: 3.15 x10E6/uL — AB (ref 4.14–5.80)
RDW: 13.4 % (ref 12.3–15.4)
WBC: 3.6 10*3/uL (ref 3.4–10.8)

## 2016-09-22 LAB — COMPREHENSIVE METABOLIC PANEL
ALBUMIN: 4.2 g/dL (ref 3.5–4.8)
ALK PHOS: 138 IU/L — AB (ref 39–117)
ALT: 81 IU/L — ABNORMAL HIGH (ref 0–44)
AST: 71 IU/L — ABNORMAL HIGH (ref 0–40)
Albumin/Globulin Ratio: 2.8 — ABNORMAL HIGH (ref 1.2–2.2)
BUN / CREAT RATIO: 8 — AB (ref 10–24)
BUN: 8 mg/dL (ref 8–27)
Bilirubin Total: 0.4 mg/dL (ref 0.0–1.2)
CO2: 23 mmol/L (ref 18–29)
CREATININE: 0.96 mg/dL (ref 0.76–1.27)
Calcium: 8.8 mg/dL (ref 8.6–10.2)
Chloride: 90 mmol/L — ABNORMAL LOW (ref 96–106)
GFR calc Af Amer: 89 mL/min/{1.73_m2} (ref >59)
GFR calc non Af Amer: 77 mL/min/{1.73_m2} (ref >59)
GLUCOSE: 292 mg/dL — AB (ref 65–99)
Globulin, Total: 1.5 g/dL (ref 1.5–4.5)
Potassium: 4.8 mmol/L (ref 3.5–5.2)
Sodium: 130 mmol/L — ABNORMAL LOW (ref 134–144)
TOTAL PROTEIN: 5.7 g/dL — AB (ref 6.0–8.5)

## 2016-09-22 LAB — LIPID PANEL W/O CHOL/HDL RATIO
CHOLESTEROL TOTAL: 156 mg/dL (ref 100–199)
HDL: 32 mg/dL — AB (ref >39)
LDL CALC: 82 mg/dL (ref 0–99)
TRIGLYCERIDES: 212 mg/dL — AB (ref 0–149)
VLDL CHOLESTEROL CAL: 42 mg/dL — AB (ref 5–40)

## 2016-09-22 LAB — TSH: TSH: 2.76 u[IU]/mL (ref 0.450–4.500)

## 2016-09-22 LAB — PSA: Prostate Specific Ag, Serum: 0.7 ng/mL (ref 0.0–4.0)

## 2016-09-22 MED ORDER — LEVOTHYROXINE SODIUM 75 MCG PO TABS: 75.0000 ug | ORAL_TABLET | Freq: Every day | ORAL | 3 refills | Status: DC

## 2016-09-22 NOTE — Telephone Encounter (Signed)
Please let him know that his labs came back OK, but he has to eat some more protein. Also his blood is a little low still- so he should see the blood doctor in February like he's scheduled. Everything else looks OK

## 2016-09-22 NOTE — Telephone Encounter (Signed)
Patient notified

## 2016-10-18 DIAGNOSIS — H2513 Age-related nuclear cataract, bilateral: Secondary | ICD-10-CM | POA: Diagnosis not present

## 2016-10-19 ENCOUNTER — Encounter: Payer: Self-pay | Admitting: *Deleted

## 2016-10-22 NOTE — Discharge Instructions (Signed)
Cataract Surgery, Care After °Refer to this sheet in the next few weeks. These instructions provide you with information about caring for yourself after your procedure. Your health care provider may also give you more specific instructions. Your treatment has been planned according to current medical practices, but problems sometimes occur. Call your health care provider if you have any problems or questions after your procedure. °What can I expect after the procedure? °After the procedure, it is common to have: °· Itching. °· Discomfort. °· Fluid discharge. °· Sensitivity to light and to touch. °· Bruising. °Follow these instructions at home: °Eye Care  °· Check your eye every day for signs of infection. Watch for: °¨ Redness, swelling, or pain. °¨ Fluid, blood, or pus. °¨ Warmth. °¨ Bad smell. °Activity  °· Avoid strenuous activities, such as playing contact sports, for as long as told by your health care provider. °· Do not drive or operate heavy machinery until your health care provider approves. °· Do not bend or lift heavy objects . Bending increases pressure in the eye. You can walk, climb stairs, and do light household chores. °· Ask your health care provider when you can return to work. If you work in a dusty environment, you may be advised to wear protective eyewear for a period of time. °General instructions  °· Take or apply over-the-counter and prescription medicines only as told by your health care provider. This includes eye drops. °· Do not touch or rub your eyes. °· If you were given a protective shield, wear it as told by your health care provider. If you were not given a protective shield, wear sunglasses as told by your health care provider to protect your eyes. °· Keep the area around your eye clean and dry. Avoid swimming or allowing water to hit you directly in the face while showering until told by your health care provider. Keep soap and shampoo out of your eyes. °· Do not put a contact lens  into the affected eye or eyes until your health care provider approves. °· Keep all follow-up visits as told by your health care provider. This is important. °Contact a health care provider if: ° °· You have increased bruising around your eye. °· You have pain that is not helped with medicine. °· You have a fever. °· You have redness, swelling, or pain in your eye. °· You have fluid, blood, or pus coming from your incision. °· Your vision gets worse. °Get help right away if: °· You have sudden vision loss. °This information is not intended to replace advice given to you by your health care provider. Make sure you discuss any questions you have with your health care provider. °Document Released: 04/16/2005 Document Revised: 02/05/2016 Document Reviewed: 08/07/2015 °Elsevier Interactive Patient Education © 2017 Elsevier [employer] ° ° ° ° °General Anesthesia, Adult, Care After °These instructions provide you with information about caring for yourself after your procedure. Your health care provider may also give you more specific instructions. Your treatment has been planned according to current medical practices, but problems sometimes occur. Call your health care provider if you have any problems or questions after your procedure. °What can I expect after the procedure? °After the procedure, it is common to have: °· Vomiting. °· A sore throat. °· Mental slowness. °It is common to feel: °· Nauseous. °· Cold or shivery. °· Sleepy. °· Tired. °· Sore or achy, even in parts of your body where you did not have surgery. °Follow these instructions at   home: °For at least 24 hours after the procedure:  °· Do not: °¨ Participate in activities where you could fall or become injured. °¨ Drive. °¨ Use heavy machinery. °¨ Drink alcohol. °¨ Take sleeping pills or medicines that cause drowsiness. °¨ Make important decisions or sign legal documents. °¨ Take care of children on your own. °· Rest. °Eating and drinking  °· If you vomit, drink  water, juice, or soup when you can drink without vomiting. °· Drink enough fluid to keep your urine clear or pale yellow. °· Make sure you have little or no nausea before eating solid foods. °· Follow the diet recommended by your health care provider. °General instructions  °· Have a responsible adult stay with you until you are awake and alert. °· Return to your normal activities as told by your health care provider. Ask your health care provider what activities are safe for you. °· Take over-the-counter and prescription medicines only as told by your health care provider. °· If you smoke, do not smoke without supervision. °· Keep all follow-up visits as told by your health care provider. This is important. °Contact a health care provider if: °· You continue to have nausea or vomiting at home, and medicines are not helpful. °· You cannot drink fluids or start eating again. °· You cannot urinate after 8-12 hours. °· You develop a skin rash. °· You have fever. °· You have increasing redness at the site of your procedure. °Get help right away if: °· You have difficulty breathing. °· You have chest pain. °· You have unexpected bleeding. °· You feel that you are having a life-threatening or urgent problem. °This information is not intended to replace advice given to you by your health care provider. Make sure you discuss any questions you have with your health care provider. °Document Released: 01/03/2001 Document Revised: 03/01/2016 Document Reviewed: 09/11/2015 °Elsevier Interactive Patient Education © 2017 Elsevier Inc. ° °

## 2016-10-25 ENCOUNTER — Encounter
Admission: RE | Disposition: A | Discharge status: Home / Self Care | Payer: Self-pay | Source: Ambulatory Visit | Attending: Ophthalmology

## 2016-10-25 ENCOUNTER — Ambulatory Visit: Payer: Medicare Other | Admitting: Student in an Organized Health Care Education/Training Program

## 2016-10-25 ENCOUNTER — Ambulatory Visit
Admission: RE | Admit: 2016-10-25 | Discharge: 2016-10-25 | Disposition: A | Discharge status: Home / Self Care | Payer: Medicare Other | Source: Ambulatory Visit | Attending: Ophthalmology | Admitting: Ophthalmology

## 2016-10-25 DIAGNOSIS — E1136 Type 2 diabetes mellitus with diabetic cataract: Secondary | ICD-10-CM | POA: Diagnosis not present

## 2016-10-25 DIAGNOSIS — D72819 Decreased white blood cell count, unspecified: Secondary | ICD-10-CM | POA: Insufficient documentation

## 2016-10-25 DIAGNOSIS — D61818 Other pancytopenia: Secondary | ICD-10-CM | POA: Insufficient documentation

## 2016-10-25 DIAGNOSIS — H2513 Age-related nuclear cataract, bilateral: Secondary | ICD-10-CM | POA: Diagnosis not present

## 2016-10-25 DIAGNOSIS — N182 Chronic kidney disease, stage 2 (mild): Secondary | ICD-10-CM | POA: Insufficient documentation

## 2016-10-25 DIAGNOSIS — I10 Essential (primary) hypertension: Secondary | ICD-10-CM | POA: Insufficient documentation

## 2016-10-25 DIAGNOSIS — Z87891 Personal history of nicotine dependence: Secondary | ICD-10-CM | POA: Diagnosis not present

## 2016-10-25 DIAGNOSIS — H2511 Age-related nuclear cataract, right eye: Secondary | ICD-10-CM | POA: Diagnosis not present

## 2016-10-25 HISTORY — DX: Hypothyroidism, unspecified: E03.9

## 2016-10-25 HISTORY — PX: CATARACT EXTRACTION W/PHACO: SHX586

## 2016-10-25 LAB — GLUCOSE, CAPILLARY
Glucose-Capillary: 173 mg/dL — ABNORMAL HIGH (ref 65–99)
Glucose-Capillary: 160 mg/dL — ABNORMAL HIGH (ref 65–99)

## 2016-10-25 SURGERY — PHACOEMULSIFICATION, CATARACT, WITH IOL INSERTION
Anesthesia: Monitor Anesthesia Care | Site: Eye | Laterality: Right | Wound class: Clean

## 2016-10-25 MED ORDER — FENTANYL CITRATE (PF) 100 MCG/2ML IJ SOLN
INTRAMUSCULAR | Status: DC | PRN
  Administered 2016-10-25: 50 ug via INTRAVENOUS

## 2016-10-25 MED ORDER — BRIMONIDINE TARTRATE-TIMOLOL 0.2-0.5 % OP SOLN
OPHTHALMIC | Status: DC | PRN
  Administered 2016-10-25: 1 [drp] via OPHTHALMIC

## 2016-10-25 MED ORDER — LIDOCAINE HCL (PF) 4 % IJ SOLN
INTRAOCULAR | Status: DC | PRN
  Administered 2016-10-25: 2 mL via OPHTHALMIC

## 2016-10-25 MED ORDER — EPINEPHRINE PF 1 MG/ML IJ SOLN
INTRAMUSCULAR | Status: DC | PRN
  Administered 2016-10-25: 67 mL via OPHTHALMIC

## 2016-10-25 MED ORDER — LACTATED RINGERS IV SOLN: INTRAVENOUS | Status: DC

## 2016-10-25 MED ORDER — MIDAZOLAM HCL 2 MG/2ML IJ SOLN
INTRAMUSCULAR | Status: DC | PRN
Start: 2016-10-25 — End: 2016-10-25
  Administered 2016-10-25: 2 mg via INTRAVENOUS

## 2016-10-25 MED ORDER — CEFUROXIME OPHTHALMIC INJECTION 1 MG/0.1 ML
INJECTION | OPHTHALMIC | Status: DC | PRN
  Administered 2016-10-25: .3 mL via OPHTHALMIC

## 2016-10-25 MED ORDER — ACETAMINOPHEN 325 MG PO TABS
650.0000 mg | ORAL_TABLET | Freq: Once | ORAL | Status: AC | PRN
  Administered 2016-10-25: 650 mg via ORAL

## 2016-10-25 MED ORDER — NA HYALUR & NA CHOND-NA HYALUR 0.4-0.35 ML IO KIT
PACK | INTRAOCULAR | Status: DC | PRN
  Administered 2016-10-25: 1 mL via INTRAOCULAR

## 2016-10-25 MED ORDER — MOXIFLOXACIN HCL 0.5 % OP SOLN
1.0000 [drp] | OPHTHALMIC | Status: DC | PRN
  Administered 2016-10-25 (×3): 1 [drp] via OPHTHALMIC

## 2016-10-25 MED ORDER — ARMC OPHTHALMIC DILATING DROPS
1.0000 "application " | OPHTHALMIC | Status: DC | PRN
  Administered 2016-10-25 (×3): 1 via OPHTHALMIC

## 2016-10-25 SURGICAL SUPPLY — 22 items
APPLICATOR COTTON TIP 3IN (MISCELLANEOUS) ×3 IMPLANT
CANNULA ANT/CHMB 27GA (MISCELLANEOUS) ×3 IMPLANT
DISSECTOR HYDRO NUCLEUS 50X22 (MISCELLANEOUS) ×3 IMPLANT
GLOVE BIO SURGEON STRL SZ7 (GLOVE) ×6 IMPLANT
GLOVE SURG LX 6.5 MICRO (GLOVE) ×2
GLOVE SURG LX STRL 6.5 MICRO (GLOVE) ×1 IMPLANT
GOWN STRL REUS W/ TWL LRG LVL3 (GOWN DISPOSABLE) ×2 IMPLANT
GOWN STRL REUS W/TWL LRG LVL3 (GOWN DISPOSABLE) ×4
LENS IOL ACRYSOF IQ 22.0 (Intraocular Lens) ×3 IMPLANT
MARKER SKIN DUAL TIP RULER LAB (MISCELLANEOUS) ×3 IMPLANT
PACK CATARACT BRASINGTON (MISCELLANEOUS) ×3 IMPLANT
PACK EYE AFTER SURG (MISCELLANEOUS) ×3 IMPLANT
PACK OPTHALMIC (MISCELLANEOUS) ×3 IMPLANT
RING MALYGIN 7.0 (MISCELLANEOUS) IMPLANT
SUT ETHILON 10-0 CS-B-6CS-B-6 (SUTURE)
SUT VICRYL  9 0 (SUTURE)
SUT VICRYL 9 0 (SUTURE) IMPLANT
SUTURE EHLN 10-0 CS-B-6CS-B-6 (SUTURE) IMPLANT
SYR TB 1ML LUER SLIP (SYRINGE) ×3 IMPLANT
WATER STERILE IRR 250ML POUR (IV SOLUTION) ×3 IMPLANT
WICK EYE OCUCEL (MISCELLANEOUS) IMPLANT
WIPE NON LINTING 3.25X3.25 (MISCELLANEOUS) ×3 IMPLANT

## 2016-10-25 NOTE — H&P (Signed)
H+P reviewed and is up to date, please see paper chart.  

## 2016-10-25 NOTE — Transfer of Care (Signed)
Immediate Anesthesia Transfer of Care Note  Patient: [name]  Procedure(s) Performed: Procedure(s) with comments: CATARACT EXTRACTION PHACO AND INTRAOCULAR LENS PLACEMENT (IOC)  right eye complicated (Right) - Right eye Diabetic - oral meds Vision blue  Patient Location: PACU  Anesthesia Type: MAC  Level of Consciousness: awake, alert  and patient cooperative  Airway and Oxygen Therapy: Patient Spontanous Breathing and Patient connected to supplemental oxygen  Post-op Assessment: Post-op Vital signs reviewed, Patient's Cardiovascular Status Stable, Respiratory Function Stable, Patent Airway and No signs of Nausea or vomiting  Post-op Vital Signs: Reviewed and stable  Complications: No apparent anesthesia complications

## 2016-10-25 NOTE — Addendum Note (Signed)
Addendum  created 10/25/16 1020 by Fidel Levy, MD   Order list changed

## 2016-10-25 NOTE — Anesthesia Preprocedure Evaluation (Addendum)
Anesthesia Evaluation  Patient identified by MRN, date of birth, ID band  Reviewed: NPO status   History of Anesthesia Complications Negative for: history of anesthetic complications  Airway Mallampati: II  TM Distance: >3 FB Neck ROM: full    Dental  (+) Edentulous Upper, Edentulous Lower   Pulmonary neg pulmonary ROS, former smoker,    Pulmonary exam normal        Cardiovascular Exercise Tolerance: Good hypertension, Normal cardiovascular exam     Neuro/Psych negative neurological ROS  negative psych ROS   GI/Hepatic negative GI ROS, Neg liver ROS,   Endo/Other  diabetesHypothyroidism   Renal/GU CRFRenal disease (ckd2)  negative genitourinary   Musculoskeletal   Abdominal   Peds  Hematology Leukopenia; Pancytopenia;   Anesthesia Other Findings Poor historian;  echo: 2014: Summary:   1. Challenging image quality.   2. Left ventricular ejection fraction, by visual estimation, is 55 to  60%.   3. Grossly normal global left ventricular systolic function.   4. Unable to exclude wall motion abnormality.   5. Impaired relaxation pattern of LV diastolic filling.   6. Normal right ventricular size and systolic function. ;   med stable: 09/2016: dr. Wynetta Emery;   Reproductive/Obstetrics                            Anesthesia Physical Anesthesia Plan  ASA: II  Anesthesia Plan: MAC   Post-op Pain Management:    Induction:   Airway Management Planned:   Additional Equipment:   Intra-op Plan:   Post-operative Plan:   Informed Consent: I have reviewed the patients History and Physical, chart, labs and discussed the procedure including the risks, benefits and alternatives for the proposed anesthesia with the patient or authorized representative who has indicated his/her understanding and acceptance.     Plan Discussed with: CRNA  Anesthesia Plan Comments:         Anesthesia  Quick Evaluation

## 2016-10-25 NOTE — Anesthesia Postprocedure Evaluation (Signed)
Anesthesia Post Note  Patient: [name]  Procedure(s) Performed: Procedure(s) (LRB): CATARACT EXTRACTION PHACO AND INTRAOCULAR LENS PLACEMENT (IOC)  right eye complicated (Right)  Patient location during evaluation: PACU Anesthesia Type: MAC Level of consciousness: awake and alert Pain management: pain level controlled Vital Signs Assessment: post-procedure vital signs reviewed and stable Respiratory status: spontaneous breathing, nonlabored ventilation, respiratory function stable and patient connected to nasal cannula oxygen Cardiovascular status: stable and blood pressure returned to baseline Anesthetic complications: no    Stanislawa Gaffin

## 2016-10-25 NOTE — Op Note (Signed)
Date of Surgery: 10/25/2016  PREOPERATIVE DIAGNOSES: Visually significant nuclear sclerotic cataract, right eye.  POSTOPERATIVE DIAGNOSES: Same  PROCEDURES PERFORMED: Cataract extraction with intraocular lens implant, right eye.  SURGEON: Almon Hercules, M.D.  ANESTHESIA: MAC and topical  IMPLANTS: [id number] +22.0 D  Implant Name Type Inv. Item Serial No. Manufacturer Lot No. LRB No. Used  LENS IOL ACRYSOF IQ 22.0 - [id number] Intraocular Lens LENS IOL ACRYSOF IQ 22.0 [id number] ALCON   Right 1     COMPLICATIONS: None.  DESCRIPTION OF PROCEDURE: Therapeutic options were discussed with the patient preoperatively, including a discussion of risks and benefits of surgery. Informed consent was obtained. An IOL-Master and immersion biometry were used to take the lens measurements, and a dilated fundus exam was performed within 6 months of the surgical date.  The patient was premedicated and brought to the operating room and placed on the operating table in the supine position. After adequate anesthesia, the patient was prepped and draped in the usual sterile ophthalmic fashion. A wire lid speculum was inserted and the microscope was positioned. A Superblade was used to create a paracentesis site at the limbus and a small amount of dilute preservative free lidocaine was instilled into the anterior chamber, followed by dispersive viscoelastic. A clear corneal incision was created temporally using a 2.4 mm keratome blade. Capsulorrhexis was then performed. In situ phacoemulsification was performed.  Cortical material was removed with the irrigation-aspiration unit. Dispersive viscoelastic was instilled to open the capsular bag. A posterior chamber intraocular lens with the specifications above was inserted and positioned. Irrigation-aspiration was used to remove all viscoelastic. Cefuroxime 1cc was instilled into the anterior chamber, and the corneal incision was checked and found to be water tight.  The eyelid speculum was removed.  The operative eye was covered with protective goggles after instilling 1 drop of Combigan. The patient tolerated the procedure well. There were no complications.

## 2016-10-25 NOTE — Anesthesia Procedure Notes (Signed)
Procedure Name: MAC Performed by: Mayme Genta Pre-anesthesia Checklist: Patient identified, Emergency Drugs available, Suction available, Timeout performed and Patient being monitored Patient Re-evaluated:Patient Re-evaluated prior to inductionOxygen Delivery Method: Nasal cannula Placement Confirmation: positive ETCO2

## 2016-10-26 ENCOUNTER — Encounter: Payer: Self-pay | Admitting: Ophthalmology

## 2016-11-24 DIAGNOSIS — Z961 Presence of intraocular lens: Secondary | ICD-10-CM | POA: Diagnosis not present

## 2016-11-24 LAB — HM DIABETES EYE EXAM

## 2016-11-24 NOTE — Progress Notes (Signed)
[place]  [phone]) [phone] Fax:([phone]) [phone]  ID: [name] OB: 1940-12-12  MR#: [id number]  JHE#:[id number]  Patient Care Team: Valerie Roys, DO as PCP - General (Family Medicine)  CHIEF COMPLAINT: Pancytopenia  INTERVAL HISTORY: Patient returns to clinic today for repeat laboratory work and further evaluation. He continues to feel well and is asymptomatic. He denies any fevers or illnesses. He denies any easy bleeding or bruising. He denies any weakness or fatigue. He denies any chest pain or shortness of breath. He has no nausea, vomiting, constipation, or diarrhea. He has no urinary complaints. Patient feels at his baseline and offers no specific complaints today.  REVIEW OF SYSTEMS:   Review of Systems  Constitutional: Negative.  Negative for fever and malaise/fatigue.  Respiratory: Negative.  Negative for cough and shortness of breath.   Cardiovascular: Negative.  Negative for chest pain and leg swelling.  Gastrointestinal: Negative.  Negative for abdominal pain, blood in stool and melena.  Musculoskeletal: Negative.   Neurological: Negative.  Negative for sensory change and weakness.  Endo/Heme/Allergies: Does not bruise/bleed easily.  Psychiatric/Behavioral: Positive for substance abuse. The patient is not nervous/anxious.     As per HPI. Otherwise, a complete review of systems is negative.  PAST MEDICAL HISTORY: Past Medical History:  Diagnosis Date  . Diabetes mellitus   . 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 Hypertension   . Hypothyroidism     PAST SURGICAL HISTORY: Past Surgical History:  Procedure Laterality Date  . CATARACT EXTRACTION W/PHACO Right 10/25/2016   Procedure: CATARACT EXTRACTION PHACO AND INTRAOCULAR LENS PLACEMENT (IOC)  right eye complicated;  Surgeon: Ronnell Freshwater, MD;  Location: [facility];  Service: Ophthalmology;  Laterality: Right;  Right  eye Diabetic - oral meds Vision blue  . FINGER SURGERY      FAMILY HISTORY Family History  Problem Relation Age of Onset  . Heart disease Mother        ADVANCED DIRECTIVES:    HEALTH MAINTENANCE: Social History  Substance Use Topics  . Smoking status: Former Smoker    Packs/day: 0.25    Types: Cigarettes    Quit date: 03/22/2016  . Smokeless tobacco: Current User    Types: Chew  . Alcohol use 14.4 oz/week    24 Cans of beer per week     Comment: rarely     Colonoscopy:  PAP:  Bone density:  Lipid panel:  Allergies  Allergen Reactions  . Metformin And Related Nausea And Vomiting    Current Outpatient Prescriptions  Medication Sig Dispense Refill  . aspirin EC 81 MG tablet Take 81 mg by mouth daily.    . DUREZOL 0.05 % EMUL     . empagliflozin (JARDIANCE) 10 MG TABS tablet Take 10 mg by mouth daily. 90 tablet 1  . levothyroxine (SYNTHROID, LEVOTHROID) 75 MCG tablet Take 1 tablet (75 mcg total) by mouth daily before breakfast. 90 tablet 3  . lisinopril (PRINIVIL,ZESTRIL) 5 MG tablet Take 1 tablet (5 mg total) by mouth daily. 90 tablet 1  . moxifloxacin (VIGAMOX) 0.5 % ophthalmic solution      No current facility-administered medications for this visit.     OBJECTIVE: Vitals:   11/25/16 1124  BP: 126/63  Pulse: 77  Temp: 98 F (36.7 C)     Body mass index is 29.32 kg/m.    ECOG FS:0 - Asymptomatic  General: Well-developed, well-nourished, no acute distress. Eyes: Pink  conjunctiva, anicteric sclera. Lungs: Clear to auscultation bilaterally. Heart: Regular rate and rhythm. No rubs, murmurs, or gallops. Abdomen: Soft, nontender, nondistended. No organomegaly noted, normoactive bowel sounds. Musculoskeletal: No edema, cyanosis, or clubbing. Neuro: Alert, answering all questions appropriately. Cranial nerves grossly intact. Skin: No rashes or petechiae noted. Psych: Normal affect.   LAB RESULTS:  Lab Results  Component Value Date   NA 130 (L)  09/21/2016   K 4.8 09/21/2016   CL 90 (L) 09/21/2016   CO2 23 09/21/2016   GLUCOSE 292 (H) 09/21/2016   BUN 8 09/21/2016   CREATININE 0.96 09/21/2016   CALCIUM 8.8 09/21/2016   PROT 5.7 (L) 09/21/2016   ALBUMIN 4.2 09/21/2016   AST 71 (H) 09/21/2016   ALT 81 (H) 09/21/2016   ALKPHOS 138 (H) 09/21/2016   BILITOT 0.4 09/21/2016   GFRNONAA 77 09/21/2016   GFRAA 89 09/21/2016    Lab Results  Component Value Date   WBC 2.7 (L) 11/25/2016   NEUTROABS 2.0 11/25/2016   HGB 12.1 (L) 11/25/2016   HCT 33.7 (L) 11/25/2016   MCV 109.4 (H) 11/25/2016   PLT 105 (L) 11/25/2016     STUDIES: No results found.  ASSESSMENT:  Pancytopenia.   PLAN:    1.  Leukopenia: Patient's white blood cell count is mildly decreased, but unchanged. Peripheral blood flow cytometry is within normal limits.  All of his other labs were either negative or within normal limits. Suspect leukopenia may be secondary to mild bone marrow suppression from patient's persistent alcohol use. No intervention is needed at this time. Patient does not require bone marrow biopsy. Will consider abdominal ultrasound in the future to assess for splenomegaly. Patient has requested less frequent follow-up, therefore will return to clinic in 6 months for laboratory work and then in one year for laboratory work and further evaluation. If patient's laboratory work remains stable at that time, he likely can be discharged from clinic. 2. Anemia: Mild. Given patient's elevated MCV, patient has a mildly decreased B-12 level, he may benefit from oral B-12 supplementation. Iron stores and folate are within normal limits. 3. Thrombocytopenia: Mild., likely secondary to alcohol use. Monitor.  Patient expressed understanding and was in agreement with this plan. He also understands that He can call clinic at any time with any questions, concerns, or complaints.    Lloyd Huger, MD   11/27/2016 12:11 PM

## 2016-11-25 ENCOUNTER — Inpatient Hospital Stay (HOSPITAL_BASED_OUTPATIENT_CLINIC_OR_DEPARTMENT_OTHER): Payer: Medicare Other | Admitting: Oncology

## 2016-11-25 ENCOUNTER — Inpatient Hospital Stay: Payer: Medicare Other | Attending: Oncology

## 2016-11-25 VITALS — BP 126/63 | HR 77 | Temp 98.0°F | Wt 190.0 lb

## 2016-11-25 DIAGNOSIS — I10 Essential (primary) hypertension: Secondary | ICD-10-CM | POA: Insufficient documentation

## 2016-11-25 DIAGNOSIS — Z79899 Other long term (current) drug therapy: Secondary | ICD-10-CM | POA: Insufficient documentation

## 2016-11-25 DIAGNOSIS — F1010 Alcohol abuse, uncomplicated: Secondary | ICD-10-CM | POA: Diagnosis not present

## 2016-11-25 DIAGNOSIS — E039 Hypothyroidism, unspecified: Secondary | ICD-10-CM | POA: Insufficient documentation

## 2016-11-25 DIAGNOSIS — D61818 Other pancytopenia: Secondary | ICD-10-CM | POA: Diagnosis not present

## 2016-11-25 DIAGNOSIS — Z7982 Long term (current) use of aspirin: Secondary | ICD-10-CM | POA: Insufficient documentation

## 2016-11-25 DIAGNOSIS — Z87891 Personal history of nicotine dependence: Secondary | ICD-10-CM | POA: Insufficient documentation

## 2016-11-25 DIAGNOSIS — E119 Type 2 diabetes mellitus without complications: Secondary | ICD-10-CM

## 2016-11-25 DIAGNOSIS — D72819 Decreased white blood cell count, unspecified: Secondary | ICD-10-CM

## 2016-11-25 LAB — CBC WITH DIFFERENTIAL/PLATELET
Basophils Absolute: 0 10*3/uL (ref 0–0.1)
Basophils Relative: 1 %
EOS ABS: 0 10*3/uL (ref 0–0.7)
Eosinophils Relative: 1 %
HEMATOCRIT: 33.7 % — AB (ref 40.0–52.0)
HEMOGLOBIN: 12.1 g/dL — AB (ref 13.0–18.0)
LYMPHS ABS: 0.5 10*3/uL — AB (ref 1.0–3.6)
LYMPHS PCT: 18 %
MCH: 39.2 pg — AB (ref 26.0–34.0)
MCHC: 35.8 g/dL (ref 32.0–36.0)
MCV: 109.4 fL — ABNORMAL HIGH (ref 80.0–100.0)
Monocytes Absolute: 0.2 10*3/uL (ref 0.2–1.0)
Monocytes Relative: 7 %
NEUTROS ABS: 2 10*3/uL (ref 1.4–6.5)
NEUTROS PCT: 73 %
Platelets: 105 10*3/uL — ABNORMAL LOW (ref 150–440)
RBC: 3.08 MIL/uL — AB (ref 4.40–5.90)
RDW: 14 % (ref 11.5–14.5)
WBC: 2.7 10*3/uL — AB (ref 3.8–10.6)

## 2016-11-25 NOTE — Progress Notes (Signed)
Patient here for follow up no changes since last appointment 

## 2016-12-21 ENCOUNTER — Ambulatory Visit: Payer: Medicare Other | Admitting: Family Medicine

## 2016-12-27 NOTE — Progress Notes (Signed)
BP 136/69 (BP Location: Left Arm, Patient Position: Sitting, Cuff Size: Normal)   Pulse 75   Temp 98.7 F (37.1 C) (Oral)   Resp 17   Ht 5' 7.5" (1.715 m)   Wt 188 lb (85.3 kg)   SpO2 96%   BMI 29.01 kg/m    Subjective:    Patient ID: [name], male    DOB: September 20, 1941, 76 y.o.   MRN: [id number]  HPI: [name] is a 76 y.o. male  Chief Complaint  Patient presents with  . Diabetes    Needs glucose meter and strips   DIABETES Hypoglycemic episodes:no Polydipsia/polyuria: no Visual disturbance: no Chest pain: no Paresthesias: no Glucose Monitoring: no Taking Insulin?: no Blood Pressure Monitoring: not checking Retinal Examination: Up to Date Foot Exam: Up to Date Diabetic Education: Completed Pneumovax: Not up to Date Influenza: Not up to Date Aspirin: yes  LEG CRAMPS Duration: weeks Pain: yes Severity: moderate  Quality:  Cramping and aching Location:  lower legs Bilateral:  yes Onset: gradual Frequency: intermittent Time of  day:   at random Sudden unintentional leg jerking:   no Paresthesias:   no Decreased sensation:  no Weakness:   no Insomnia:   no Fatigue:   no Status: fluctuating  Relevant past medical, surgical, family and social history reviewed and updated as indicated. Interim medical history since our last visit reviewed. Allergies and medications reviewed and updated.  Review of Systems  Constitutional: Negative.   Respiratory: Negative.   Cardiovascular: Negative.   Musculoskeletal: Positive for myalgias. Negative for arthralgias, back pain, gait problem, joint swelling, neck pain and neck stiffness.  Neurological: Negative.   Psychiatric/Behavioral: Negative.     Per HPI unless specifically indicated above     Objective:    BP 136/69 (BP Location: Left Arm, Patient Position: Sitting, Cuff Size: Normal)   Pulse 75   Temp 98.7 F (37.1 C) (Oral)   Resp 17   Ht 5' 7.5" (1.715 m)   Wt 188 lb (85.3 kg)   SpO2 96%    BMI 29.01 kg/m   Wt Readings from Last 3 Encounters:  12/28/16 188 lb (85.3 kg)  11/25/16 190 lb 0.6 oz (86.2 kg)  10/25/16 190 lb (86.2 kg)    Physical Exam  Constitutional: He is oriented to person, place, and time. He appears well-developed and well-nourished. No distress.  HENT:  Head: Normocephalic and atraumatic.  Right Ear: Hearing normal.  Left Ear: Hearing normal.  Nose: Nose normal.  Eyes: Conjunctivae and lids are normal. Right eye exhibits no discharge. Left eye exhibits no discharge. No scleral icterus.  Cardiovascular: Normal rate, regular rhythm, normal heart sounds and intact distal pulses.  Exam reveals no gallop and no friction rub.   No murmur heard. Pulmonary/Chest: Effort normal and breath sounds normal. No respiratory distress. He has no wheezes. He has no rales. He exhibits no tenderness.  Musculoskeletal: Normal range of motion.  Neurological: He is alert and oriented to person, place, and time.  Skin: Skin is warm, dry and intact. No rash noted. No erythema. No pallor.  Psychiatric: He has a normal mood and affect. His speech is normal and behavior is normal. Judgment and thought content normal. Cognition and memory are normal.  Nursing note and vitals reviewed.   Results for orders placed or performed in visit on 11/25/16  CBC with Differential/Platelet  Result Value Ref Range   WBC 2.7 (L) 3.8 - 10.6 K/uL   RBC 3.08 (L) 4.40 -  5.90 MIL/uL   Hemoglobin 12.1 (L) 13.0 - 18.0 g/dL   HCT 33.7 (L) 40.0 - 52.0 %   MCV 109.4 (H) 80.0 - 100.0 fL   MCH 39.2 (H) 26.0 - 34.0 pg   MCHC 35.8 32.0 - 36.0 g/dL   RDW 14.0 11.5 - 14.5 %   Platelets 105 (L) 150 - 440 K/uL   Neutrophils Relative % 73 %   Neutro Abs 2.0 1.4 - 6.5 K/uL   Lymphocytes Relative 18 %   Lymphs Abs 0.5 (L) 1.0 - 3.6 K/uL   Monocytes Relative 7 %   Monocytes Absolute 0.2 0.2 - 1.0 K/uL   Eosinophils Relative 1 %   Eosinophils Absolute 0.0 0 - 0.7 K/uL   Basophils Relative 1 %   Basophils  Absolute 0.0 0 - 0.1 K/uL      Assessment & Plan:   Problem List Items Addressed This Visit      Endocrine   Type 2 diabetes, uncontrolled, 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 Continue current regimen. Continue to monitor. Call with any concerns.       Relevant Orders   Bayer DCA Hb A1c Waived    Other Visit Diagnoses    Leg cramps       Will check CMP. Await results. Increase fluids. Continue to monitor. Call with any concerns.    Relevant Orders   Comprehensive metabolic panel       Follow up plan: Return in about 3 months (around 03/30/2017) for DM/Chol/BP follow up.

## 2016-12-28 ENCOUNTER — Ambulatory Visit: Payer: Medicare Other | Admitting: Family Medicine

## 2016-12-28 ENCOUNTER — Ambulatory Visit (INDEPENDENT_AMBULATORY_CARE_PROVIDER_SITE_OTHER): Payer: Medicare Other | Admitting: Family Medicine

## 2016-12-28 ENCOUNTER — Encounter: Payer: Self-pay | Admitting: Family Medicine

## 2016-12-28 VITALS — BP 136/69 | HR 75 | Temp 98.7°F | Resp 17 | Ht 67.5 in | Wt 188.0 lb

## 2016-12-28 DIAGNOSIS — IMO0002 Reserved for concepts with insufficient information to code with codable children: Secondary | ICD-10-CM

## 2016-12-28 DIAGNOSIS — E1165 Type 2 diabetes mellitus with hyperglycemia: Secondary | ICD-10-CM | POA: Diagnosis not present

## 2016-12-28 DIAGNOSIS — E1140 Type 2 diabetes mellitus with diabetic neuropathy, unspecified: Secondary | ICD-10-CM | POA: Diagnosis not present

## 2016-12-28 DIAGNOSIS — R252 Cramp and spasm: Secondary | ICD-10-CM | POA: Diagnosis not present

## 2016-12-28 LAB — BAYER DCA HB A1C WAIVED: HB A1C: 7.3 % — AB (ref <7.0)

## 2016-12-28 NOTE — Assessment & Plan Note (Signed)
A1c down to 7.3. Continue current regimen. Continue to monitor. Call with any concerns.

## 2016-12-29 ENCOUNTER — Telehealth: Payer: Self-pay | Admitting: Family Medicine

## 2016-12-29 DIAGNOSIS — R748 Abnormal levels of other serum enzymes: Secondary | ICD-10-CM

## 2016-12-29 LAB — COMPREHENSIVE METABOLIC PANEL
A/G RATIO: 2.4 — AB (ref 1.2–2.2)
ALBUMIN: 4.4 g/dL (ref 3.5–4.8)
ALT: 77 IU/L — ABNORMAL HIGH (ref 0–44)
AST: 81 IU/L — ABNORMAL HIGH (ref 0–40)
Alkaline Phosphatase: 236 IU/L — ABNORMAL HIGH (ref 39–117)
BILIRUBIN TOTAL: 0.7 mg/dL (ref 0.0–1.2)
BUN / CREAT RATIO: 9 — AB (ref 10–24)
BUN: 7 mg/dL — ABNORMAL LOW (ref 8–27)
CO2: 25 mmol/L (ref 18–29)
Calcium: 9.1 mg/dL (ref 8.6–10.2)
Chloride: 95 mmol/L — ABNORMAL LOW (ref 96–106)
Creatinine, Ser: 0.81 mg/dL (ref 0.76–1.27)
GFR calc non Af Amer: 87 mL/min/{1.73_m2} (ref >59)
GFR, EST AFRICAN AMERICAN: 101 mL/min/{1.73_m2} (ref >59)
Globulin, Total: 1.8 g/dL (ref 1.5–4.5)
Glucose: 159 mg/dL — ABNORMAL HIGH (ref 65–99)
Potassium: 4.9 mmol/L (ref 3.5–5.2)
Sodium: 135 mmol/L (ref 134–144)
TOTAL PROTEIN: 6.2 g/dL (ref 6.0–8.5)

## 2016-12-29 NOTE — Telephone Encounter (Signed)
Please let [name] know that one of his liver functions is pretty high. i'd like him to come in for another lab test (order in). Otherwise everything looks good.

## 2016-12-29 NOTE — Telephone Encounter (Signed)
Patient notified

## 2016-12-31 ENCOUNTER — Other Ambulatory Visit: Payer: Medicare Other

## 2016-12-31 DIAGNOSIS — R748 Abnormal levels of other serum enzymes: Secondary | ICD-10-CM | POA: Diagnosis not present

## 2017-01-01 LAB — GAMMA GT: GGT: 1464 IU/L (ref 0–65)

## 2017-01-03 ENCOUNTER — Telehealth: Payer: Self-pay | Admitting: Family Medicine

## 2017-01-03 ENCOUNTER — Encounter: Payer: Self-pay | Admitting: *Deleted

## 2017-01-03 DIAGNOSIS — R748 Abnormal levels of other serum enzymes: Secondary | ICD-10-CM | POA: Insufficient documentation

## 2017-01-03 NOTE — Telephone Encounter (Signed)
Called and gave [name] his results. We will obtain [facility] [place] and get him into GI- he requests that these appointments be after 01/11/17 due to financial issues.

## 2017-01-05 ENCOUNTER — Telehealth: Payer: Self-pay

## 2017-01-05 DIAGNOSIS — Z598 Other problems related to housing and economic circumstances: Secondary | ICD-10-CM

## 2017-01-05 DIAGNOSIS — Z599 Problem related to housing and economic circumstances, unspecified: Secondary | ICD-10-CM

## 2017-01-05 NOTE — Telephone Encounter (Signed)
Thank you- I was aware of the 4/3 date and had put that in the comments on the order. Thanks for rescheduling it.   Can you please put in a referral to [facility] for social work? Thanks!

## 2017-01-05 NOTE — Telephone Encounter (Signed)
Called patient to remind him of his [facility] appointment that is 01/07/2017. Patient said that he was supposed to get directions. Gave patient directions over the phone.   Patient then asked if we'd call and reschedule his appointment till after 04/03 because of he didn't get his check from the government till then. Patient then went on to explain he didn't have any money, and no gas in his truck. Patient explained he's been having to go to his church to get food.   I'd explain to patient that i'd call and reschedule his appointment. His appointment is for 01/12/17 @ 10:15. I called patient back immediately after. No answer LVM to return my call.  Routing to provider about the eating situation.

## 2017-01-06 NOTE — Telephone Encounter (Signed)
Yes, enter in referral for social services and I'll direct it to [facility].

## 2017-01-07 ENCOUNTER — Ambulatory Visit: Payer: Medicare Other

## 2017-01-12 ENCOUNTER — Ambulatory Visit: Payer: Medicare Other

## 2017-01-20 ENCOUNTER — Other Ambulatory Visit: Payer: Self-pay | Admitting: *Deleted

## 2017-01-20 NOTE — Patient Outreach (Signed)
[facility]) Care Management  01/20/2017  [name] 10-Apr-1941 [id number]   Phone call to patient to assess for community resource needs. Per patient, he is having financial difficulties and often does not have money left over after his bills are paid.  Patient states that the church is providing him with food regularly which keeps him going., Patient requested a home visit to discuss his concerns further.   Plan: Home visit scheduled for 01/28/17 at 1:00 pm.   [place], [place] Management [phone]

## 2017-01-28 ENCOUNTER — Other Ambulatory Visit: Payer: Self-pay | Admitting: *Deleted

## 2017-01-28 ENCOUNTER — Encounter: Payer: Self-pay | Admitting: *Deleted

## 2017-01-28 NOTE — Patient Outreach (Deleted)
  [facility]) Care Management  [facility] Social Work  01/28/2017  DANZELL BIRKY 1941/04/24 [id number]  Subjective:  [name] is a 76 year old male  Objective:   Encounter Medications:  Outpatient Encounter Prescriptions as of 01/28/2017  Medication Sig  . aspirin EC 81 MG tablet Take 81 mg by mouth daily.  . empagliflozin (JARDIANCE) 10 MG TABS tablet Take 10 mg by mouth daily.  Marland Kitchen levothyroxine (SYNTHROID, LEVOTHROID) 75 MCG tablet Take 1 tablet (75 mcg total) by mouth daily before breakfast.  . lisinopril (PRINIVIL,ZESTRIL) 5 MG tablet Take 1 tablet (5 mg total) by mouth daily.   No facility-administered encounter medications on file as of 01/28/2017.     Functional Status:  In your present state of health, do you have any difficulty performing the following activities: 01/28/2017 10/25/2016  Hearing? N N  Vision? Y N  Difficulty concentrating or making decisions? Y N  Walking or climbing stairs? Y N  Dressing or bathing? N N  Doing errands, shopping? Y -  Conservation officer, nature and eating ? N -  Using the Toilet? N -  In the past six months, have you accidently leaked urine? Y -  Do you have problems with loss of bowel control? Y -  Managing your Medications? N -  Managing your Finances? N -  Housekeeping or managing your Housekeeping? N -  Some recent data might be hidden    Fall/Depression Screening:  PHQ 2/9 Scores 01/28/2017 09/21/2016 04/14/2016  PHQ - 2 Score 0 0 0    Assessment: Does not want food stamps, goes to church to get food, first Saturday of the month and then another mid week and every Friday of the month.   [employer] electric Keo? Appointment with [place] on 05/25/17 at 2:45    reviewed catalog of product proiviuded by insurance  Resources provided   [employer] not have resources like that ? Will check   Plan:

## 2017-01-31 ENCOUNTER — Other Ambulatory Visit: Payer: Self-pay

## 2017-01-31 ENCOUNTER — Encounter: Payer: Self-pay | Admitting: Gastroenterology

## 2017-01-31 ENCOUNTER — Encounter: Payer: Self-pay | Admitting: *Deleted

## 2017-01-31 ENCOUNTER — Ambulatory Visit (INDEPENDENT_AMBULATORY_CARE_PROVIDER_SITE_OTHER): Payer: Medicare Other | Admitting: Gastroenterology

## 2017-01-31 ENCOUNTER — Telehealth: Payer: Self-pay

## 2017-01-31 VITALS — BP 144/67 | HR 70 | Temp 98.5°F | Resp 16 | Wt 188.4 lb

## 2017-01-31 DIAGNOSIS — R7989 Other specified abnormal findings of blood chemistry: Secondary | ICD-10-CM

## 2017-01-31 DIAGNOSIS — R748 Abnormal levels of other serum enzymes: Secondary | ICD-10-CM

## 2017-01-31 DIAGNOSIS — F1010 Alcohol abuse, uncomplicated: Secondary | ICD-10-CM

## 2017-01-31 DIAGNOSIS — R945 Abnormal results of liver function studies: Secondary | ICD-10-CM

## 2017-01-31 NOTE — Telephone Encounter (Signed)
Advised patient for [place] on Thursday, 4/26 @ [place] @ 815am.  NPO after midnight.

## 2017-01-31 NOTE — Progress Notes (Signed)
Gastroenterology Consultation  Referring Provider:     Valerie Roys, DO Primary Care Physician:  Martin Liter, DO Primary Gastroenterologist:  Dr. Jonathon [name]  Reason for Consultation:     Abnormal LFT        HPI:   [name] is a 76 y.o. y/o male referred for consultation & management  by Dr. Park Liter, DO He has been referred for an elevated GGT and alkaline phosphotase. I do note that a USG abdomen was ordered as well which has not yet been done.   Hb 11/25/16 was 12.1 grams with a MCV of 109 and platelet count of 105 . 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  CMP 12/28/16 showed an elevated alkaline phosphotase at 236 with elevated AST/ALT as well 81 and 77 resp. Albumin low at 2.4. Alk phos was normal 9 months back.   First noted abnormality in LFT's in 4 months back  .  Alcohol use Beer- 5-6 cans a day x many years "dont know how ,many"*  Drug use no  Over the counter herbal supplements no  New medications no  Abdominal pain no  Tattoos no  Military service no  Prior blood transfusion no  Incarceration no  History of travel no  Family history of liver disease : No  Recent change in weight : no   He wasn't sure why he was here, explained he has been referred for abnornal lft's.        Past Medical History:  Diagnosis Date  . Diabetes mellitus   . 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 Hypertension   . Hypothyroidism     Past Surgical History:  Procedure Laterality Date  . CATARACT EXTRACTION W/PHACO Right 10/25/2016   Procedure: CATARACT EXTRACTION PHACO AND INTRAOCULAR LENS PLACEMENT (IOC)  right eye complicated;  Surgeon: Martin Freshwater, MD;  Location: [facility];  Service: Ophthalmology;  Laterality: Right;  Right eye Diabetic - oral meds Vision blue  . FINGER SURGERY      Prior to Admission medications   Medication Sig Start Date End Date Taking? Authorizing Provider  aspirin EC  81 MG tablet Take 81 mg by mouth daily.    Historical Provider, MD  empagliflozin (JARDIANCE) 10 MG TABS tablet Take 10 mg by mouth daily. 09/21/16   Martin P Johnson, DO  levothyroxine (SYNTHROID, LEVOTHROID) 75 MCG tablet Take 1 tablet (75 mcg total) by mouth daily before breakfast. 09/22/16   Martin P Johnson, DO  lisinopril (PRINIVIL,ZESTRIL) 5 MG tablet Take 1 tablet (5 mg total) by mouth daily. 09/21/16   Martin Annia Friendly, DO    Family History  Problem Relation Age of Onset  . Heart disease Mother   . Alzheimer's disease Sister   . Heart disease Maternal Aunt      Social History  Substance Use Topics  . Smoking status: Former Smoker    Packs/day: 0.25    Types: Cigarettes    Quit date: 03/22/2016  . Smokeless tobacco: Current User    Types: Chew  . Alcohol use 14.4 oz/week    24 Cans of beer per week     Comment: rarely    Allergies as of 01/31/2017 - Review Complete 01/31/2017  Allergen Reaction Noted  . Metformin and related Nausea And Vomiting 06/30/2015    Review of Systems:    All systems reviewed and negative except where noted in HPI.   Physical Exam:  BP (!) 144/67   Pulse 70   Temp 98.5 F (36.9 C) (Oral)   Resp 16   Wt 188 lb 6.4 oz (85.5 kg)   BMI 29.07 kg/m  No LMP for male patient. Psych:  Alert and cooperative. Normal mood and affect. General:   Alert,  Well-developed, well-nourished, pleasant and cooperative in NAD Head:  Normocephalic and atraumatic. Eyes:  Sclera clear, no icterus.   Conjunctiva pink. Ears:  Normal auditory acuity. Nose:  No deformity, discharge, or lesions. Mouth:  No deformity or lesions,oropharynx pink & moist. Neck:  Supple; no masses or thyromegaly. Lungs: few spider angiomas over chest  Respirations even and unlabored.  Clear throughout to auscultation.   No wheezes, crackles, or rhonchi. No acute distress. Heart:  Regular rate and rhythm; no murmurs, clicks, rubs, or gallops. Abdomen:  Normal bowel sounds.  No bruits.   Soft, non-tender and mildlydistended without masses, hepatosplenomegaly or hernias noted.  No guarding or rebound tenderness.    Pulses:  Normal pulses noted. Extremities:  No clubbing or edema.  No cyanosis. Neurologic:  Alert and oriented x3;  grossly normal neurologically. Skin:  Intact without significant lesions or rashes. No jaundice. t.  Imaging Studies: No results found.  Assessment and Plan:   [name] is a 76 y.o. y/o male has been referred for elevated alkaline phosphotase. Differentials are wide, I will rule out viral and autoimmune hepatitis, in addition will check for enzyme deficiencies. 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 I will also check his INR, PTH, Vitamin D level to rule out hyperparathyroidism as a cause.   Plan  1. Viral , autoimmune screen, PTH,Vitamin D, celiac serology . Recheck CMP 2. Obtain RUQ USG of liver  3. Counselled on excess alcohol consumption and effects on liver- advised to stop    Follow up in 8-10 weeks   Dr Martin Bellows MD

## 2017-01-31 NOTE — Patient Outreach (Addendum)
[facility]) Care Management  [facility] Social Work  01/31/2017  [name] Mar 03, 1941 [id number]  Subjective:  Patient is a 76 year old male referred to [facility] care management services to assist with financial community resources.  Patient states that he is married, however his spouse resides in a rest home in [place]. Patient's adult son resides with patient, however reportedly does not assist with care. Ptient states that he cannot read or write and has a person at the post office who assist him with making out his money orders and mailing his bills monthly. Patient discussed being limited financially and will often utilize church food ministries to obtain food. Patient aware of the church food ministry schedule, where he can obtain food for each day of the week. Patient states that he does not want to apply for food stamps. Per patient, his insurance covers his medication cost and transportation to medical appointments if needed.  Patient has a cain purchased through the [employer]. Per patient, once his bills are paid, he has approximately $200.00 for the month. Per patient, his main concern is his electric bill which is often over $300.00 dollars.  Objective:   Encounter Medications:  Outpatient Encounter Prescriptions as of 01/28/2017  Medication Sig  . aspirin EC 81 MG tablet Take 81 mg by mouth daily.  . empagliflozin (JARDIANCE) 10 MG TABS tablet Take 10 mg by mouth daily.  Marland Kitchen levothyroxine (SYNTHROID, LEVOTHROID) 75 MCG tablet Take 1 tablet (75 mcg total) by mouth daily before breakfast.  . lisinopril (PRINIVIL,ZESTRIL) 5 MG tablet Take 1 tablet (5 mg total) by mouth daily.   No facility-administered encounter medications on file as of 01/28/2017.     Functional Status:  In your present state of health, do you have any difficulty performing the following activities: 01/28/2017 10/25/2016  Hearing? N N  Vision? Y N  Difficulty concentrating or making  decisions? Y N  Walking or climbing stairs? Y N  Dressing or bathing? N N  Doing errands, shopping? Y -  Conservation officer, nature and eating ? N -  Using the Toilet? N -  In the past six months, have you accidently leaked urine? Y -  Do you have problems with loss of bowel control? Y -  Managing your Medications? N -  Managing your Finances? N -  Housekeeping or managing your Housekeeping? N -  Some recent data might be hidden    Fall/Depression Screening:  PHQ 2/9 Scores 01/28/2017 09/21/2016 04/14/2016  PHQ - 2 Score 0 0 0    Assessment: Pharmacy student present as well for this visit. Patient grew increasingly defensive and confrontative throughout the visit ending the visit  by making very strong culturally insensitive comments regarding African Americans. Offensive comments made with no identifiable trigger.   Patient aware of community resources for food and has also stated that he has made efforts to apply for the [place] Program, however they were unable to assist. Phone call to the Department of [place] and was able to confirm that there are no funds available for the [employer] or the Crisis Intervention Program.  Patient provided with the [place] caregiver [employer] for his review. Per patient, his son will assist with reading the material provided.  Reviewed catalog of products through [employer], patient familiar with this catalog and has ordered a cain.  Patient has an appointment with [facility] on 05/25/17 at 2:45 pm.  Plan: [employer] provided to patient to assist with  any additional community resource needs. Patient will be closed to [facility] care management at this time.    Sheralyn Boatman [facility] Care Management [phone]

## 2017-02-03 ENCOUNTER — Ambulatory Visit
Admission: RE | Admit: 2017-02-03 | Discharge: 2017-02-03 | Disposition: A | Discharge status: Home / Self Care | Payer: Medicare Other | Source: Ambulatory Visit | Attending: Family Medicine | Admitting: Family Medicine

## 2017-02-03 DIAGNOSIS — R748 Abnormal levels of other serum enzymes: Secondary | ICD-10-CM | POA: Insufficient documentation

## 2017-02-04 ENCOUNTER — Telehealth: Payer: Self-pay

## 2017-02-04 NOTE — Telephone Encounter (Signed)
Advised patient of results per Dr. Vicente Males.   Patient will callback to schedule MRCP, if he decides to proceed.

## 2017-02-04 NOTE — Telephone Encounter (Signed)
-----   Message from Jonathon Bellows, MD sent at 02/03/2017  1:54 PM EDT ----- Dahlia Byes   Please infrom patient his bile duct looks slightly wider than it should be and next step is a MRCP  FYI Dr Sharlet Salina

## 2017-03-30 ENCOUNTER — Encounter: Payer: Self-pay | Admitting: Family Medicine

## 2017-03-30 ENCOUNTER — Ambulatory Visit (INDEPENDENT_AMBULATORY_CARE_PROVIDER_SITE_OTHER): Payer: Medicare Other | Admitting: Family Medicine

## 2017-03-30 VITALS — BP 128/52 | HR 79 | Temp 98.5°F | Wt 187.0 lb

## 2017-03-30 DIAGNOSIS — E1140 Type 2 diabetes mellitus with diabetic neuropathy, unspecified: Secondary | ICD-10-CM | POA: Diagnosis not present

## 2017-03-30 DIAGNOSIS — IMO0002 Reserved for concepts with insufficient information to code with codable children: Secondary | ICD-10-CM

## 2017-03-30 DIAGNOSIS — E1122 Type 2 diabetes mellitus with diabetic chronic kidney disease: Secondary | ICD-10-CM

## 2017-03-30 DIAGNOSIS — N181 Chronic kidney disease, stage 1: Secondary | ICD-10-CM | POA: Diagnosis not present

## 2017-03-30 DIAGNOSIS — R252 Cramp and spasm: Secondary | ICD-10-CM

## 2017-03-30 DIAGNOSIS — E1165 Type 2 diabetes mellitus with hyperglycemia: Secondary | ICD-10-CM

## 2017-03-30 DIAGNOSIS — D61818 Other pancytopenia: Secondary | ICD-10-CM

## 2017-03-30 DIAGNOSIS — I129 Hypertensive chronic kidney disease with stage 1 through stage 4 chronic kidney disease, or unspecified chronic kidney disease: Secondary | ICD-10-CM | POA: Diagnosis not present

## 2017-03-30 LAB — BAYER DCA HB A1C WAIVED: HB A1C: 5.8 % (ref <7.0)

## 2017-03-30 LAB — MICROALBUMIN, URINE WAIVED
CREATININE, URINE WAIVED: 50 mg/dL (ref 10–300)
MICROALB, UR WAIVED: 150 mg/L — AB (ref 0–19)

## 2017-03-30 MED ORDER — LISINOPRIL 5 MG PO TABS: 5.0000 mg | ORAL_TABLET | Freq: Every day | ORAL | 1 refills | Status: DC

## 2017-03-30 MED ORDER — EMPAGLIFLOZIN 10 MG PO TABS: 10.0000 mg | ORAL_TABLET | Freq: Every day | ORAL | 1 refills | Status: DC

## 2017-03-30 NOTE — Assessment & Plan Note (Signed)
Checking labs today. Await results.  

## 2017-03-30 NOTE — Assessment & Plan Note (Signed)
A1c down to 5.8. Continue current regimen. Continue to monitor.

## 2017-03-30 NOTE — Progress Notes (Signed)
BP (!) 128/52   Pulse 79   Temp 98.5 F (36.9 C)   Wt 187 lb (84.8 kg)   SpO2 96%   BMI 28.86 kg/m    Subjective:    Patient ID: [name], male    DOB: [birth year], 76 y.o.   MRN: [id number]  HPI: [name] is a 76 y.o. male  Chief Complaint  Patient presents with  . Hypertension  . Hyperlipidemia  . Diabetes  . Other    Handicap sticker   HYPERTENSION / HYPERLIPIDEMIA Satisfied with current treatment? yes Duration of hypertension: chronic BP monitoring frequency: not checking BP medication side effects: no Past BP meds: lisinopril Duration of hyperlipidemia: chronic Cholesterol medication side effects: Not on anything Cholesterol supplements: none Medication compliance: good compliance Aspirin: no Recent stressors: no Recurrent headaches: no Visual changes: no Palpitations: no Dyspnea: no Chest pain: no Lower extremity edema: no Dizzy/lightheaded: no  DIABETES Hypoglycemic episodes:no Polydipsia/polyuria: no Visual disturbance: no Chest pain: no Paresthesias: no Glucose Monitoring: no Taking Insulin?: no Blood Pressure Monitoring: not checking Retinal Examination: Up to Date Foot Exam: Up to Date Diabetic Education: Not Completed Pneumovax: Declined Influenza: Not Up to Date Aspirin: no   Relevant past medical, surgical, family and social history reviewed and updated as indicated. Interim medical history since our last visit reviewed. Allergies and medications reviewed and updated.  Review of Systems  Constitutional: Negative.   Respiratory: Negative.   Cardiovascular: Negative.        Having leg cramps when he walks. Starting to become debilitating  Musculoskeletal: Positive for myalgias. Negative for arthralgias, back pain, gait problem, joint swelling, neck pain and neck stiffness.  Psychiatric/Behavioral: Negative.     Per HPI unless specifically indicated above     Objective:    BP (!) 128/52   Pulse 79   Temp 98.5 F  (36.9 C)   Wt 187 lb (84.8 kg)   SpO2 96%   BMI 28.86 kg/m   Wt Readings from Last 3 Encounters:  03/30/17 187 lb (84.8 kg)  01/31/17 188 lb 6.4 oz (85.5 kg)  12/28/16 188 lb (85.3 kg)    Physical Exam  Constitutional: He is oriented to person, place, and time. He appears well-developed and well-nourished. No distress.  HENT:  Head: Normocephalic and atraumatic.  Right Ear: Hearing normal.  Left Ear: Hearing normal.  Nose: Nose normal.  Eyes: Conjunctivae and lids are normal. Right eye exhibits no discharge. Left eye exhibits no discharge. No scleral icterus.  Cardiovascular: Normal rate, regular rhythm, normal heart sounds and intact distal pulses.  Exam reveals no gallop and no friction rub.   No murmur heard. Pulmonary/Chest: Effort normal and breath sounds normal. No respiratory distress. He has no wheezes. He has no rales. He exhibits no tenderness.  Musculoskeletal: Normal range of motion.  Neurological: He is alert and oriented to person, place, and time.  Skin: Skin is warm, dry and intact. No rash noted. He is not diaphoretic. No erythema. No pallor.  Psychiatric: He has a normal mood and affect. His speech is normal and behavior is normal. Judgment and thought content normal. Cognition and memory are normal.  Nursing note and vitals reviewed.   Results for orders placed or performed in visit on 12/31/16  Gamma GT  Result Value Ref Range   GGT 1,464 (HH) 0 - 65 IU/L      Assessment & Plan:   Problem List Items Addressed This Visit      Endocrine  Type 2 diabetes, uncontrolled, with neuropathy (HCC) - Primary    A1c down to 5.8. Continue current regimen. Continue to monitor.       Relevant Medications   empagliflozin (JARDIANCE) 10 MG TABS tablet   lisinopril (PRINIVIL,ZESTRIL) 5 MG tablet   Other Relevant Orders   Bayer DCA Hb A1c Waived   Comprehensive metabolic panel   Lipid Panel w/o Chol/HDL Ratio   Microalbumin, Urine Waived   CKD stage 1 due to type  2 diabetes mellitus ([place])    Checking labs today. Await results.       Relevant Medications   empagliflozin (JARDIANCE) 10 MG TABS tablet   lisinopril (PRINIVIL,ZESTRIL) 5 MG tablet   Other Relevant Orders   Comprehensive metabolic panel   Microalbumin, Urine Waived     Genitourinary   Benign hypertensive renal disease    Under good control on recheck. Call with any concerns.       Relevant Medications   lisinopril (PRINIVIL,ZESTRIL) 5 MG tablet   Other Relevant Orders   Comprehensive metabolic panel   Microalbumin, Urine Waived     Hematopoietic and Hemostatic   Pancytopenia (HCC)    Rechecking levels, continue to follow with hematology. Call with any concerns.       Relevant Orders   Comprehensive metabolic panel   CBC with Differential/Platelet     Other   Leg cramps    Concern for claudication. Offered referral to vascular, but he is not sure. Will think about it and we'll recheck when we call with his results. Handicapped placard form filled out today.          Follow up plan: Return in about 3 months (around 06/30/2017) for Diabetes follow up with A1c.

## 2017-03-30 NOTE — Assessment & Plan Note (Signed)
Rechecking levels, continue to follow with hematology. Call with any concerns.

## 2017-03-30 NOTE — Assessment & Plan Note (Addendum)
Concern for claudication. Offered referral to vascular, but he is not sure. Will think about it and we'll recheck when we call with his results. Handicapped placard form filled out today.

## 2017-03-30 NOTE — Assessment & Plan Note (Signed)
Under good control on recheck. Call with any concerns.

## 2017-03-31 ENCOUNTER — Telehealth: Payer: Self-pay | Admitting: Family Medicine

## 2017-03-31 LAB — CBC WITH DIFFERENTIAL/PLATELET
BASOS ABS: 0 10*3/uL (ref 0.0–0.2)
Basos: 1 %
EOS (ABSOLUTE): 0 10*3/uL (ref 0.0–0.4)
Eos: 1 %
Hematocrit: 31.3 % — ABNORMAL LOW (ref 37.5–51.0)
Hemoglobin: 10.8 g/dL — ABNORMAL LOW (ref 13.0–17.7)
IMMATURE GRANS (ABS): 0 10*3/uL (ref 0.0–0.1)
Immature Granulocytes: 0 %
LYMPHS: 14 %
Lymphocytes Absolute: 0.4 10*3/uL — ABNORMAL LOW (ref 0.7–3.1)
MCH: 39.9 pg — ABNORMAL HIGH (ref 26.6–33.0)
MCHC: 34.5 g/dL (ref 31.5–35.7)
MCV: 116 fL — AB (ref 79–97)
Monocytes Absolute: 0.2 10*3/uL (ref 0.1–0.9)
Monocytes: 8 %
NEUTROS ABS: 2.2 10*3/uL (ref 1.4–7.0)
Neutrophils: 76 %
PLATELETS: 77 10*3/uL — AB (ref 150–379)
RBC: 2.71 x10E6/uL — CL (ref 4.14–5.80)
RDW: 13.7 % (ref 12.3–15.4)
WBC: 2.9 10*3/uL — ABNORMAL LOW (ref 3.4–10.8)

## 2017-03-31 LAB — COMPREHENSIVE METABOLIC PANEL
ALBUMIN: 3.8 g/dL (ref 3.5–4.8)
ALT: 56 IU/L — ABNORMAL HIGH (ref 0–44)
AST: 81 IU/L — ABNORMAL HIGH (ref 0–40)
Albumin/Globulin Ratio: 2.1 (ref 1.2–2.2)
Alkaline Phosphatase: 229 IU/L — ABNORMAL HIGH (ref 39–117)
BUN / CREAT RATIO: 9 — AB (ref 10–24)
BUN: 7 mg/dL — AB (ref 8–27)
Bilirubin Total: 0.9 mg/dL (ref 0.0–1.2)
CALCIUM: 9 mg/dL (ref 8.6–10.2)
CO2: 22 mmol/L (ref 20–29)
CREATININE: 0.81 mg/dL (ref 0.76–1.27)
Chloride: 94 mmol/L — ABNORMAL LOW (ref 96–106)
GFR calc Af Amer: 101 mL/min/{1.73_m2} (ref >59)
GFR, EST NON AFRICAN AMERICAN: 87 mL/min/{1.73_m2} (ref >59)
GLUCOSE: 244 mg/dL — AB (ref 65–99)
Globulin, Total: 1.8 g/dL (ref 1.5–4.5)
Potassium: 4.7 mmol/L (ref 3.5–5.2)
Sodium: 133 mmol/L — ABNORMAL LOW (ref 134–144)
TOTAL PROTEIN: 5.6 g/dL — AB (ref 6.0–8.5)

## 2017-03-31 LAB — LIPID PANEL W/O CHOL/HDL RATIO
CHOLESTEROL TOTAL: 156 mg/dL (ref 100–199)
HDL: 50 mg/dL (ref >39)
LDL CALC: 87 mg/dL (ref 0–99)
Triglycerides: 97 mg/dL (ref 0–149)
VLDL CHOLESTEROL CAL: 19 mg/dL (ref 5–40)

## 2017-03-31 NOTE — Telephone Encounter (Signed)
Message relayed to patient. Verbalized understanding and denied questions.   

## 2017-03-31 NOTE — Telephone Encounter (Signed)
Please let [name] know that his labs look stable and his cholesterol is better. Thanks!

## 2017-05-26 ENCOUNTER — Inpatient Hospital Stay: Payer: Medicare Other | Attending: Oncology

## 2017-05-26 DIAGNOSIS — D61818 Other pancytopenia: Secondary | ICD-10-CM | POA: Diagnosis not present

## 2017-05-26 DIAGNOSIS — D72819 Decreased white blood cell count, unspecified: Secondary | ICD-10-CM

## 2017-05-26 LAB — CBC WITH DIFFERENTIAL/PLATELET
BASOS ABS: 0 10*3/uL (ref 0–0.1)
Basophils Relative: 0 %
Eosinophils Absolute: 0 10*3/uL (ref 0–0.7)
Eosinophils Relative: 0 %
HEMATOCRIT: 33.6 % — AB (ref 40.0–52.0)
Hemoglobin: 12.2 g/dL — ABNORMAL LOW (ref 13.0–18.0)
LYMPHS ABS: 0.6 10*3/uL — AB (ref 1.0–3.6)
LYMPHS PCT: 15 %
MCH: 42.2 pg — AB (ref 26.0–34.0)
MCHC: 36.1 g/dL — ABNORMAL HIGH (ref 32.0–36.0)
MCV: 116.8 fL — AB (ref 80.0–100.0)
MONO ABS: 0.3 10*3/uL (ref 0.2–1.0)
Monocytes Relative: 8 %
NEUTROS ABS: 3 10*3/uL (ref 1.4–6.5)
Neutrophils Relative %: 77 %
Platelets: 67 10*3/uL — ABNORMAL LOW (ref 150–440)
RBC: 2.88 MIL/uL — ABNORMAL LOW (ref 4.40–5.90)
RDW: 14 % (ref 11.5–14.5)
WBC: 4 10*3/uL (ref 3.8–10.6)

## 2017-06-20 ENCOUNTER — Inpatient Hospital Stay
Admission: EM | Admit: 2017-06-20 | Discharge: 2017-06-21 | DRG: 641 | Discharge status: Against Medical Advice | Payer: Medicare Other | Attending: Internal Medicine | Admitting: Internal Medicine

## 2017-06-20 ENCOUNTER — Encounter: Payer: Self-pay | Admitting: Emergency Medicine

## 2017-06-20 DIAGNOSIS — I83029 Varicose veins of left lower extremity with ulcer of unspecified site: Secondary | ICD-10-CM | POA: Diagnosis present

## 2017-06-20 DIAGNOSIS — Z7982 Long term (current) use of aspirin: Secondary | ICD-10-CM

## 2017-06-20 DIAGNOSIS — E861 Hypovolemia: Secondary | ICD-10-CM | POA: Diagnosis present

## 2017-06-20 DIAGNOSIS — Z79899 Other long term (current) drug therapy: Secondary | ICD-10-CM

## 2017-06-20 DIAGNOSIS — F1010 Alcohol abuse, uncomplicated: Secondary | ICD-10-CM | POA: Diagnosis present

## 2017-06-20 DIAGNOSIS — E039 Hypothyroidism, unspecified: Secondary | ICD-10-CM | POA: Diagnosis present

## 2017-06-20 DIAGNOSIS — E871 Hypo-osmolality and hyponatremia: Secondary | ICD-10-CM | POA: Diagnosis not present

## 2017-06-20 DIAGNOSIS — I83019 Varicose veins of right lower extremity with ulcer of unspecified site: Secondary | ICD-10-CM | POA: Diagnosis present

## 2017-06-20 DIAGNOSIS — L03116 Cellulitis of left lower limb: Secondary | ICD-10-CM | POA: Diagnosis not present

## 2017-06-20 DIAGNOSIS — D61818 Other pancytopenia: Secondary | ICD-10-CM | POA: Diagnosis not present

## 2017-06-20 DIAGNOSIS — E1140 Type 2 diabetes mellitus with diabetic neuropathy, unspecified: Secondary | ICD-10-CM | POA: Diagnosis not present

## 2017-06-20 DIAGNOSIS — E119 Type 2 diabetes mellitus without complications: Secondary | ICD-10-CM | POA: Diagnosis not present

## 2017-06-20 DIAGNOSIS — D6959 Other secondary thrombocytopenia: Secondary | ICD-10-CM | POA: Diagnosis not present

## 2017-06-20 DIAGNOSIS — R600 Localized edema: Secondary | ICD-10-CM | POA: Diagnosis not present

## 2017-06-20 DIAGNOSIS — L03115 Cellulitis of right lower limb: Secondary | ICD-10-CM | POA: Diagnosis present

## 2017-06-20 DIAGNOSIS — Z888 Allergy status to other drugs, medicaments and biological substances status: Secondary | ICD-10-CM

## 2017-06-20 DIAGNOSIS — L97929 Non-pressure chronic ulcer of unspecified part of left lower leg with unspecified severity: Secondary | ICD-10-CM | POA: Diagnosis not present

## 2017-06-20 DIAGNOSIS — K7030 Alcoholic cirrhosis of liver without ascites: Secondary | ICD-10-CM | POA: Diagnosis present

## 2017-06-20 DIAGNOSIS — L0390 Cellulitis, unspecified: Secondary | ICD-10-CM | POA: Diagnosis not present

## 2017-06-20 DIAGNOSIS — I872 Venous insufficiency (chronic) (peripheral): Secondary | ICD-10-CM | POA: Diagnosis present

## 2017-06-20 DIAGNOSIS — Z720 Tobacco use: Secondary | ICD-10-CM | POA: Diagnosis not present

## 2017-06-20 DIAGNOSIS — L97919 Non-pressure chronic ulcer of unspecified part of right lower leg with unspecified severity: Secondary | ICD-10-CM | POA: Diagnosis present

## 2017-06-20 DIAGNOSIS — G629 Polyneuropathy, unspecified: Secondary | ICD-10-CM | POA: Diagnosis not present

## 2017-06-20 DIAGNOSIS — I10 Essential (primary) hypertension: Secondary | ICD-10-CM | POA: Diagnosis present

## 2017-06-20 DIAGNOSIS — R188 Other ascites: Secondary | ICD-10-CM | POA: Diagnosis not present

## 2017-06-20 HISTORY — DX: Other specified disorders of veins: I87.8

## 2017-06-20 HISTORY — DX: Polyneuropathy, unspecified: G62.9

## 2017-06-20 LAB — COMPREHENSIVE METABOLIC PANEL
ALBUMIN: 4 g/dL (ref 3.5–5.0)
ALT: 51 U/L (ref 17–63)
AST: 107 U/L — ABNORMAL HIGH (ref 15–41)
Alkaline Phosphatase: 289 U/L — ABNORMAL HIGH (ref 38–126)
Anion gap: 11 (ref 5–15)
BUN: 15 mg/dL (ref 6–20)
CO2: 24 mmol/L (ref 22–32)
Calcium: 9.3 mg/dL (ref 8.9–10.3)
Chloride: 81 mmol/L — ABNORMAL LOW (ref 101–111)
Creatinine, Ser: 0.96 mg/dL (ref 0.61–1.24)
GFR calc Af Amer: >60 mL/min (ref >60)
GLUCOSE: 150 mg/dL — AB (ref 65–99)
POTASSIUM: 4.6 mmol/L (ref 3.5–5.1)
Sodium: 116 mmol/L — CL (ref 135–145)
Total Bilirubin: 3.4 mg/dL — ABNORMAL HIGH (ref 0.3–1.2)
Total Protein: 6.9 g/dL (ref 6.5–8.1)

## 2017-06-20 LAB — CBC
HEMATOCRIT: 31.3 % — AB (ref 40.0–52.0)
Hemoglobin: 11.6 g/dL — ABNORMAL LOW (ref 13.0–18.0)
MCH: 41.7 pg — ABNORMAL HIGH (ref 26.0–34.0)
MCHC: 37 g/dL — ABNORMAL HIGH (ref 32.0–36.0)
MCV: 112.6 fL — ABNORMAL HIGH (ref 80.0–100.0)
PLATELETS: 98 10*3/uL — AB (ref 150–440)
RBC: 2.78 MIL/uL — AB (ref 4.40–5.90)
RDW: 13.8 % (ref 11.5–14.5)
WBC: 6.7 10*3/uL (ref 3.8–10.6)

## 2017-06-20 LAB — SODIUM
SODIUM: 117 mmol/L — AB (ref 135–145)
SODIUM: 119 mmol/L — AB (ref 135–145)

## 2017-06-20 LAB — GLUCOSE, CAPILLARY
GLUCOSE-CAPILLARY: 145 mg/dL — AB (ref 65–99)
Glucose-Capillary: 112 mg/dL — ABNORMAL HIGH (ref 65–99)
Glucose-Capillary: 138 mg/dL — ABNORMAL HIGH (ref 65–99)

## 2017-06-20 LAB — HEMOGLOBIN A1C
Hgb A1c MFr Bld: 5.1 % (ref 4.8–5.6)
Mean Plasma Glucose: 99.67 mg/dL

## 2017-06-20 MED ORDER — LORAZEPAM 2 MG/ML IJ SOLN: 0.0000 mg | Freq: Four times a day (QID) | INTRAMUSCULAR | Status: DC

## 2017-06-20 MED ORDER — LORAZEPAM 2 MG/ML IJ SOLN: 1.0000 mg | Freq: Four times a day (QID) | INTRAMUSCULAR | Status: DC | PRN

## 2017-06-20 MED ORDER — CANAGLIFLOZIN 100 MG PO TABS: 100.0000 mg | ORAL_TABLET | Freq: Every day | ORAL | Status: DC

## 2017-06-20 MED ORDER — LORAZEPAM 2 MG/ML IJ SOLN: 0.0000 mg | Freq: Two times a day (BID) | INTRAMUSCULAR | Status: DC

## 2017-06-20 MED ORDER — VITAMIN B-1 100 MG PO TABS
100.0000 mg | ORAL_TABLET | Freq: Every day | ORAL | Status: DC
  Administered 2017-06-20 – 2017-06-21 (×2): 100 mg via ORAL
  Filled 2017-06-20 (×2): qty 1

## 2017-06-20 MED ORDER — LORAZEPAM 1 MG PO TABS: 1.0000 mg | ORAL_TABLET | Freq: Four times a day (QID) | ORAL | Status: DC | PRN

## 2017-06-20 MED ORDER — SODIUM CHLORIDE 0.9 % IV SOLN
INTRAVENOUS | Status: DC
  Administered 2017-06-20 (×2): via INTRAVENOUS

## 2017-06-20 MED ORDER — LEVOTHYROXINE SODIUM 50 MCG PO TABS
75.0000 ug | ORAL_TABLET | Freq: Every day | ORAL | Status: DC
  Administered 2017-06-21: 08:00:00 75 ug via ORAL
  Filled 2017-06-20: qty 1

## 2017-06-20 MED ORDER — ONDANSETRON HCL 4 MG PO TABS: 4.0000 mg | ORAL_TABLET | Freq: Four times a day (QID) | ORAL | Status: DC | PRN

## 2017-06-20 MED ORDER — INSULIN ASPART 100 UNIT/ML ~~LOC~~ SOLN: 0.0000 [IU] | Freq: Every day | SUBCUTANEOUS | Status: DC

## 2017-06-20 MED ORDER — SODIUM CHLORIDE 0.9 % IV SOLN
3.0000 g | INTRAVENOUS | Status: AC
  Administered 2017-06-20: 3 g via INTRAVENOUS
  Filled 2017-06-20: qty 3

## 2017-06-20 MED ORDER — GUAIFENESIN-DM 100-10 MG/5ML PO SYRP
5.0000 mL | ORAL_SOLUTION | ORAL | Status: DC | PRN
  Administered 2017-06-20 – 2017-06-21 (×2): 5 mL via ORAL
  Filled 2017-06-20 (×4): qty 5

## 2017-06-20 MED ORDER — ENOXAPARIN SODIUM 40 MG/0.4ML ~~LOC~~ SOLN
40.0000 mg | SUBCUTANEOUS | Status: DC
  Administered 2017-06-20: 40 mg via SUBCUTANEOUS
  Filled 2017-06-20: qty 0.4

## 2017-06-20 MED ORDER — ACETAMINOPHEN 325 MG PO TABS
650.0000 mg | ORAL_TABLET | Freq: Four times a day (QID) | ORAL | Status: DC | PRN
  Administered 2017-06-20: 650 mg via ORAL
  Filled 2017-06-20: qty 2

## 2017-06-20 MED ORDER — ADULT MULTIVITAMIN W/MINERALS CH
1.0000 | ORAL_TABLET | Freq: Every day | ORAL | Status: DC
  Administered 2017-06-20 – 2017-06-21 (×2): 1 via ORAL
  Filled 2017-06-20 (×2): qty 1

## 2017-06-20 MED ORDER — THIAMINE HCL 100 MG/ML IJ SOLN
100.0000 mg | Freq: Every day | INTRAMUSCULAR | Status: DC
  Filled 2017-06-20: qty 1

## 2017-06-20 MED ORDER — ASPIRIN EC 81 MG PO TBEC
81.0000 mg | DELAYED_RELEASE_TABLET | Freq: Every day | ORAL | Status: DC
  Administered 2017-06-20 – 2017-06-21 (×2): 81 mg via ORAL
  Filled 2017-06-20 (×2): qty 1

## 2017-06-20 MED ORDER — INSULIN ASPART 100 UNIT/ML ~~LOC~~ SOLN
0.0000 [IU] | Freq: Three times a day (TID) | SUBCUTANEOUS | Status: DC
  Administered 2017-06-20 – 2017-06-21 (×2): 1 [IU] via SUBCUTANEOUS
  Filled 2017-06-20 (×2): qty 1

## 2017-06-20 MED ORDER — LISINOPRIL 5 MG PO TABS
5.0000 mg | ORAL_TABLET | Freq: Every day | ORAL | Status: DC
  Administered 2017-06-20 – 2017-06-21 (×2): 5 mg via ORAL
  Filled 2017-06-20 (×2): qty 1

## 2017-06-20 MED ORDER — ACETAMINOPHEN 650 MG RE SUPP: 650.0000 mg | Freq: Four times a day (QID) | RECTAL | Status: DC | PRN

## 2017-06-20 MED ORDER — CEFAZOLIN SODIUM-DEXTROSE 1-4 GM/50ML-% IV SOLN
1.0000 g | Freq: Once | INTRAVENOUS | Status: AC
  Administered 2017-06-20: 1 g via INTRAVENOUS
  Filled 2017-06-20: qty 50

## 2017-06-20 MED ORDER — SODIUM CHLORIDE 0.9 % IV SOLN
3.0000 g | Freq: Four times a day (QID) | INTRAVENOUS | Status: DC
  Administered 2017-06-20 – 2017-06-21 (×3): 3 g via INTRAVENOUS
  Filled 2017-06-20 (×6): qty 3

## 2017-06-20 MED ORDER — FOLIC ACID 1 MG PO TABS
1.0000 mg | ORAL_TABLET | Freq: Every day | ORAL | Status: DC
  Administered 2017-06-20 – 2017-06-21 (×2): 1 mg via ORAL
  Filled 2017-06-20 (×2): qty 1

## 2017-06-20 MED ORDER — ONDANSETRON HCL 4 MG/2ML IJ SOLN: 4.0000 mg | Freq: Four times a day (QID) | INTRAMUSCULAR | Status: DC | PRN

## 2017-06-20 NOTE — ED Notes (Signed)
This RN to bedside at this time to introduce self to patient and family. Pt requesting some water to drink. Pt denies any other needs. Explained has a bed assigned and will get patient upstairs ASAP. Pt and family states understanding.

## 2017-06-20 NOTE — Progress Notes (Signed)
Pharmacy Antibiotic Note  [name] is a 76 y.o. male admitted on 06/20/2017 with cellulitis.  Pharmacy has been consulted for Unasyn dosing.  2 weeks of worsening erythema and discharge on his bilateral lower legs with Hx DM.  Plan: Will order Unasyn 3 gram IV x1 in ER. Will continue with Unasyn 3 gram IV q6h.    Weight: 187 lb (84.8 kg)  Temp (24hrs), Avg:98.2 F (36.8 C), Min:98.2 F (36.8 C), Max:98.2 F (36.8 C)   Recent Labs Lab 06/20/17 0829  WBC 6.7  CREATININE 0.96    Estimated Creatinine Clearance: 68.8 mL/min (by C-G formula based on SCr of 0.96 mg/dL).    Allergies  Allergen Reactions  . Metformin And Related Nausea And Vomiting    Antimicrobials this admission: Unasyn  9/10 >>       >>    Dose adjustments this admission:    Microbiology results:  9/10 BCx:  P   UCx:      Sputum:      MRSA PCR:    Thank you for allowing pharmacy to be a part of this patient's care.  Dylan Ruotolo A 06/20/2017 11:21 AM

## 2017-06-20 NOTE — Plan of Care (Signed)
Problem: Pain Managment: Goal: General experience of comfort will improve Outcome: Progressing Tylenol given once for lower leg pain with improvement.  Problem: Physical Regulation: Goal: Ability to maintain clinical measurements within normal limits will improve Outcome: Progressing Pt admitted today from the ED. A&Ox4. VSS. Pt calm and cooperative. No signs of alcohol withdrawal noted. CIWA score 0.  Problem: Skin Integrity: Goal: Risk for impaired skin integrity will decrease Outcome: Progressing Bilateral LE elevated on pillow. Wound care not done due to order state apply zinc? Pharmacy nor supply not sure what zinc is. Paged Sheilah Pigeon RN, but no response yet.

## 2017-06-20 NOTE — ED Notes (Signed)
[name] has been in to speak with the pt about needing to be admitted. Pt is not wanting to stay, attempting to inform pt of the severity of his condition.[name]

## 2017-06-20 NOTE — Progress Notes (Signed)
Pharmacy communication  Current order for Invokana (canagliflozin) has been discontinued due to [employer] Non-formulary information, non-approved medication for [facility].  Patient currently ordered SSI.  Chinita Greenland PharmD Clinical Pharmacist 06/20/2017

## 2017-06-20 NOTE — Consult Note (Signed)
[place] Nurse wound consult note Reason for Consult:Venous insufficiency with resulting edema, erythema and nonintact, partial thickness lesions.  Will apply zinc oxide layer secured with self adherent Coban.  Change twice weekly.  Wound type:Venous insufficiency Pressure Injury POA: NA Wound bed: pink and moist Drainage (amount, consistency, odor) scant serosanguinous weeping.  No odor Periwound:blanchable erythema Dressing procedure/placement/frequency:Cleanse bilateral lower legs with soap and water and pat dry.  Wrap with zinc layer from below toes to below knee.  Secure with self adherent COban wrap.  50% overlap.  Change Monday and Thursday.   [place] team will follow.  Domenic Moras RN BSN [place] Pager [phone]

## 2017-06-20 NOTE — H&P (Signed)
[facility] NAME: [name]    MR#:  [id number]  DATE OF BIRTH:  [birth year]  DATE OF ADMISSION:  06/20/2017  PRIMARY CARE PHYSICIAN: Valerie Roys, DO   REQUESTING/REFERRING PHYSICIAN: Dr. Lavonia Drafts  CHIEF COMPLAINT:   Chief Complaint  Patient presents with  . Cellulitis    HISTORY OF PRESENT ILLNESS:  [name]  is a 76 y.o. male with a known history of on-insulin-dependent diabetes mellitus, hypertension, hypothyroidism, neuropathy, chronic venous stasis with bilateral leg ulcerations, ongoing alcohol abuse presents to the hospital secondary to lower extremity cramps and open ulcers. Patient has had venous stasis ulcers for a long time, just worsened with more erythema, redness and weeping ulcers for 2 weeks now. Since symptoms are getting worse, he presented to the emergency room. Also complains of some lightheadedness and dizziness. Labs indicate a sodium of 116. He says he drinks very minimal amount of water less than 250 cc per whole day, reduced solid intake as well. Drinks at least up to 20 beers per day. Initially he refused to stay in the hospital. After talking to his son, he agreed. Denies any nausea or vomiting. Has chronic leg cramps.No fevers or chills.  PAST MEDICAL HISTORY:   Past Medical History:  Diagnosis Date  . Diabetes mellitus   . 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 Hypertension   . Hypothyroidism   . Neuropathy   . Venous stasis     PAST SURGICAL HISTORY:   Past Surgical History:  Procedure Laterality Date  . CATARACT EXTRACTION W/PHACO Right 10/25/2016   Procedure: CATARACT EXTRACTION PHACO AND INTRAOCULAR LENS PLACEMENT (IOC)  right eye complicated;  Surgeon: Ronnell Freshwater, MD;  Location: [facility];  Service: Ophthalmology;  Laterality: Right;  Right eye Diabetic - oral meds Vision blue  . FINGER SURGERY      SOCIAL  HISTORY:   Social History  Substance Use Topics  . Smoking status: Former Smoker    Packs/day: 0.25    Types: Cigarettes    Quit date: 03/22/2016  . Smokeless tobacco: Current User    Types: Chew  . Alcohol use 14.4 oz/week    24 Cans of beer per week     Comment: every day    FAMILY HISTORY:   Family History  Problem Relation Age of Onset  . Heart disease Mother   . Alzheimer's disease Sister   . Heart disease Maternal Aunt     DRUG ALLERGIES:   Allergies  Allergen Reactions  . Metformin And Related Nausea And Vomiting    REVIEW OF SYSTEMS:   Review of Systems  Constitutional: Positive for malaise/fatigue. Negative for chills, fever and weight loss.  HENT: Negative for ear discharge, ear pain, hearing loss and nosebleeds.   Eyes: Negative for blurred vision, double vision and photophobia.  Respiratory: Negative for cough, hemoptysis, shortness of breath and wheezing.   Cardiovascular: Positive for leg swelling. Negative for chest pain, palpitations and orthopnea.  Gastrointestinal: Negative for abdominal pain, constipation, diarrhea, melena, nausea and vomiting.  Genitourinary: Negative for dysuria, frequency and urgency.  Musculoskeletal: Positive for myalgias. Negative for back pain and neck pain.  Skin: Positive for rash.  Neurological: Negative for dizziness, sensory change, speech change, focal weakness and headaches.  Endo/Heme/Allergies: Does not bruise/bleed easily.  Psychiatric/Behavioral: Negative for depression.    MEDICATIONS AT HOME:   Prior to Admission medications  Medication Sig Start Date End Date Taking? Authorizing Provider  empagliflozin (JARDIANCE) 10 MG TABS tablet Take 10 mg by mouth daily. 03/30/17  Yes Johnson, Megan P, DO  levothyroxine (SYNTHROID, LEVOTHROID) 75 MCG tablet Take 1 tablet (75 mcg total) by mouth daily before breakfast. 09/22/16  Yes Johnson, Megan P, DO  lisinopril (PRINIVIL,ZESTRIL) 5 MG tablet Take 1 tablet (5 mg total)  by mouth daily. 03/30/17  Yes Johnson, Megan P, DO  aspirin EC 81 MG tablet Take 81 mg by mouth daily.    [provider]      VITAL SIGNS:  Blood pressure (!) 170/67, pulse 81, temperature 98.2 F (36.8 C), temperature source Oral, resp. rate 20, weight 84.8 kg (187 lb), SpO2 100 %.  PHYSICAL EXAMINATION:   Physical Exam  GENERAL:  76 y.o.-year-old patient lying in the bed with no acute distress. dissheleved appearing EYES: Pupils equal, round, reactive to light and accommodation. No scleral icterus. Extraocular muscles intact.  HEENT: Head atraumatic, normocephalic. Oropharynx and nasopharynx clear.  NECK:  Supple, no jugular venous distention. No thyroid enlargement, no tenderness.  LUNGS: Normal breath sounds bilaterally, no wheezing, rales,rhonchi or crepitation. No use of accessory muscles of respiration.  CARDIOVASCULAR: S1, S2 normal. No murmurs, rubs, or gallops.  ABDOMEN: Soft, nontender, nondistended. Bowel sounds present. No organomegaly or mass.  EXTREMITIES: No  cyanosis, or clubbing. 2+ edema of both lower extremities, with weeping superficial ulcers in the lower 1/3rd. Significant erythema noted as well NEUROLOGIC: Cranial nerves II through XII are intact. Muscle strength 5/5 in all extremities. Sensation intact. Gait not checked.  PSYCHIATRIC: The patient is alert and oriented x 3.  SKIN: No obvious rash, lesion, or ulcer.   LABORATORY PANEL:   CBC  Recent Labs Lab 06/20/17 0829  WBC 6.7  HGB 11.6*  HCT 31.3*  PLT 98*   ------------------------------------------------------------------------------------------------------------------  Chemistries   Recent Labs Lab 06/20/17 0829  NA 116*  K 4.6  CL 81*  CO2 24  GLUCOSE 150*  BUN 15  CREATININE 0.96  CALCIUM 9.3  AST 107*  ALT 51  ALKPHOS 289*  BILITOT 3.4*   ------------------------------------------------------------------------------------------------------------------  Cardiac  Enzymes No results for input(s): TROPONINI in the last 168 hours. ------------------------------------------------------------------------------------------------------------------  RADIOLOGY:  No results found.  EKG:   Orders placed or performed in visit on 01/12/13  . EKG 12-Lead    IMPRESSION AND PLAN:   [name]  is a 76 y.o. male with a known history of on-insulin-dependent diabetes mellitus, hypertension, hypothyroidism, neuropathy, chronic venous stasis with bilateral leg ulcerations, ongoing alcohol abuse presents to the hospital secondary to lower extremity cramps and open ulcers.   #1 Acute hyponatremia- normal sodium of 133 in June 2018 and prior as well - likely beer potomania, decreased oral intake- water and solute as well - IV fluids gentle, sodium check q6h - nephrology consulted, monitor  #2 Lower extremity cellulitis- secondary to chronic stasis - wound care consult, IV unasyn for now - keep the legs elevated  #3 NIDDM- only on jardiance at home- substituted to Invokana here A1c, SSI  #4 HTN- lisinopril for renal protection  #5 Chronic pancytopenia- follows with hematology Secondary to chronic alcohol use  #6 Elevated LFTs and bili- liver cirrhosis and also dilated CBD for which he followed with GI as outpatient. Refused MRCP for now, stable numbers- outpatient follow up  #7 Alcohol abuse- counseled, CIWA protocol  #8 DVT Prophylaxis- lovenox    All the records are reviewed and case discussed with  ED provider. Management plans discussed with the patient, family and they are in agreement.  CODE STATUS: Full Code  TOTAL TIME TAKING CARE OF THIS PATIENT: 50 minutes.    [name] M.D on 06/20/2017 at 11:04 AM  Between 7am to 6pm - Pager - [phone]  After 6pm go to [email or web address] - password [email or web address]  Office  [phone]  CC: Primary care physician; Valerie Roys, DO

## 2017-06-20 NOTE — ED Notes (Signed)
Pt c/o increased redness, swelling and weeping of BL LE for the past 3 weeks, states he has not taken his DM meds for the past 3 days thinking that was the problem,. Denies fever, N/V/D. States he had similar sx years ago.Marland Kitchen

## 2017-06-20 NOTE — ED Triage Notes (Signed)
Pt with bilateral leg pain and infection with weeping,.

## 2017-06-20 NOTE — ED Provider Notes (Signed)
[facility] Emergency Department Provider Note   ____________________________________________    I have reviewed the triage vital signs and the nursing notes.   HISTORY  Chief Complaint Cellulitis     HPI [name] is a 76 y.o. male who presents with 2 weeks of worsening erythema and discharge on his bilateral lower legs. Patient is a history of diabetes but reports he's never had this before. Patient did smoke up until about 2 years ago. He states he has put Vaseline on the area with no improvement. Denies fevers or chills. He reports he does have some decreased sensation in the lower legs. Review of records demonstrates the patient has been evaluated by hematology for pancytopenia and this was treated to alcohol use.   Past Medical History:  Diagnosis Date  . Diabetes mellitus   . 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 Hypertension   . Hypothyroidism     Patient Active Problem List   Diagnosis Date Noted  . Leg cramps 03/30/2017  . Elevated alkaline phosphatase level 01/03/2017  . Elevated serum GGT level 01/03/2017  . Hypothyroidism 04/08/2015  . Benign hypertensive renal disease 04/07/2015  . Leukopenia 04/07/2015  . Type 2 diabetes, uncontrolled, with neuropathy ([place]) 04/07/2015  . 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 Pancytopenia ([place]) 04/07/2015    Past Surgical History:  Procedure Laterality Date  . CATARACT EXTRACTION W/PHACO Right 10/25/2016   Procedure: CATARACT EXTRACTION PHACO AND INTRAOCULAR LENS PLACEMENT (IOC)  right eye complicated;  Surgeon: Ronnell Freshwater, MD;  Location: [facility];  Service: Ophthalmology;  Laterality: Right;  Right eye Diabetic - oral meds Vision blue  . FINGER SURGERY      Prior to Admission medications   Medication Sig Start Date End Date Taking? Authorizing Provider  empagliflozin (JARDIANCE) 10 MG TABS  tablet Take 10 mg by mouth daily. 03/30/17  Yes Johnson, Megan P, DO  levothyroxine (SYNTHROID, LEVOTHROID) 75 MCG tablet Take 1 tablet (75 mcg total) by mouth daily before breakfast. 09/22/16  Yes Johnson, Megan P, DO  lisinopril (PRINIVIL,ZESTRIL) 5 MG tablet Take 1 tablet (5 mg total) by mouth daily. 03/30/17  Yes Johnson, Megan P, DO  aspirin EC 81 MG tablet Take 81 mg by mouth daily.    [provider]     Allergies Metformin and related  Family History  Problem Relation Age of Onset  . Heart disease Mother   . Alzheimer's disease Sister   . Heart disease Maternal Aunt     Social History Social History  Substance Use Topics  . Smoking status: Former Smoker    Packs/day: 0.25    Types: Cigarettes    Quit date: 03/22/2016  . Smokeless tobacco: Current User    Types: Chew  . Alcohol use 14.4 oz/week    24 Cans of beer per week     Comment: rarely    Review of Systems  Constitutional: No fever/chills Eyes: No visual changes.  ENT: No sore throat. Cardiovascular: Denies chest pain. Respiratory: Denies shortness of breath. Gastrointestinal: No abdominal pain.  No nausea, no vomiting.   Genitourinary: Negative for dysuria. Musculoskeletal: Negative for back pain. Skin: as above Neurological: Negative for headaches or weakness   ____________________________________________   PHYSICAL EXAM:  VITAL SIGNS: ED Triage Vitals  Enc Vitals Group     BP 06/20/17 0817 (!) 172/62     Pulse  Rate 06/20/17 0816 78     Resp 06/20/17 0816 20     Temp 06/20/17 0816 98.2 F (36.8 C)     Temp Source 06/20/17 0816 Oral     SpO2 06/20/17 0816 100 %     Weight 06/20/17 0809 84.8 kg (187 lb)     Height --      Head Circumference --      Peak Flow --      Pain Score --      Pain Loc --      Pain Edu? --      Excl. in [place]? --     Constitutional: Alert and oriented. No acute distress. Pleasant and interactive Eyes: Conjunctivae are normal.   Nose: No  congestion/rhinnorhea. Mouth/Throat: Mucous membranes are moist.    Cardiovascular: Normal rate, regular rhythm. Grossly normal heart sounds.  Good peripheral circulation. Respiratory: Normal respiratory effort.  No retractions. Lungs CTAB. Gastrointestinal: Soft and nontender. No distention.  No CVA tenderness. Genitourinary: deferred Musculoskeletal:both lower extremities warm and well perfused, 2+ DP pulses bilaterally.patient with weeping, shallow areas of ulceration bilateral lower extremities most consistent with early venous stasisulcers, some surrounding erythema difficult to determine whether this is infected Neurologic:  Normal speech and language. No gross focal neurologic deficits are appreciated.  Skin:  Skin is warm, as above Psychiatric: Mood and affect are normal. Speech and behavior are normal.  ____________________________________________   LABS (all labs ordered are listed, but only abnormal results are displayed)  Labs Reviewed  CBC - Abnormal; Notable for the following:       Result Value   RBC 2.78 (*)    Hemoglobin 11.6 (*)    HCT 31.3 (*)    MCV 112.6 (*)    MCH 41.7 (*)    MCHC 37.0 (*)    Platelets 98 (*)    All other components within normal limits  COMPREHENSIVE METABOLIC PANEL - Abnormal; Notable for the following:    Sodium 116 (*)    Chloride 81 (*)    Glucose, Bld 150 (*)    AST 107 (*)    Alkaline Phosphatase 289 (*)    Total Bilirubin 3.4 (*)    All other components within normal limits  CULTURE, BLOOD (ROUTINE X 2)  CULTURE, BLOOD (ROUTINE X 2)   ____________________________________________  EKG  None ____________________________________________  RADIOLOGY  None ____________________________________________   PROCEDURES  Procedure(s) performed: No    Critical Care performed: yes  CRITICAL CARE Performed by: Lavonia Drafts   Total critical care time:30 minutes  Critical care time was exclusive of separately billable  procedures and treating other patients.  Critical care was necessary to treat or prevent imminent or life-threatening deterioration.  Critical care was time spent personally by me on the following activities: development of treatment plan with patient and/or surrogate as well as nursing, discussions with consultants, evaluation of patient's response to treatment, examination of patient, obtaining history from patient or surrogate, ordering and performing treatments and interventions, ordering and review of laboratory studies, ordering and review of radiographic studies, pulse oximetry and re-evaluation of patient's condition.  ____________________________________________   INITIAL IMPRESSION / ASSESSMENT AND PLAN / ED COURSE  Pertinent labs & imaging results that were available during my care of the patient were reviewed by me and considered in my medical decision making (see chart for details).  Patient presents with concerns of possible lower extremity infections, I suspect related to venous stasis changes. Patient is afebrile with normal heart rate.  We will check labs and reevaluate   Notified of critical sodium of 116. White blood cell count of 6.7, this could be considered slightly elevated for him based on his baseline of 2.5.I'll treat with a dose of IV Ancef. I discussed with the hospitalist service for admission       ____________________________________________   FINAL CLINICAL IMPRESSION(S) / ED DIAGNOSES  Final diagnoses:  Acute hyponatremia  Venous stasis dermatitis of both lower extremities      NEW MEDICATIONS STARTED DURING THIS VISIT:  New Prescriptions   No medications on file     Note:  This document was prepared using Dragon voice recognition software and may include unintentional dictation errors.    Lavonia Drafts, MD 06/20/17 [phone]

## 2017-06-21 LAB — CBC
HCT: 24.4 % — ABNORMAL LOW (ref 40.0–52.0)
Hemoglobin: 8.9 g/dL — ABNORMAL LOW (ref 13.0–18.0)
MCH: 41.8 pg — AB (ref 26.0–34.0)
MCHC: 36.7 g/dL — AB (ref 32.0–36.0)
MCV: 114 fL — ABNORMAL HIGH (ref 80.0–100.0)
PLATELETS: 80 10*3/uL — AB (ref 150–440)
RBC: 2.14 MIL/uL — ABNORMAL LOW (ref 4.40–5.90)
RDW: 13.9 % (ref 11.5–14.5)
WBC: 4.3 10*3/uL (ref 3.8–10.6)

## 2017-06-21 LAB — BASIC METABOLIC PANEL
Anion gap: 7 (ref 5–15)
BUN: 23 mg/dL — AB (ref 6–20)
CALCIUM: 8.5 mg/dL — AB (ref 8.9–10.3)
CO2: 24 mmol/L (ref 22–32)
CREATININE: 1.02 mg/dL (ref 0.61–1.24)
Chloride: 90 mmol/L — ABNORMAL LOW (ref 101–111)
GFR calc Af Amer: >60 mL/min (ref >60)
GFR calc non Af Amer: >60 mL/min (ref >60)
GLUCOSE: 127 mg/dL — AB (ref 65–99)
Potassium: 4.7 mmol/L (ref 3.5–5.1)
Sodium: 121 mmol/L — ABNORMAL LOW (ref 135–145)

## 2017-06-21 LAB — GLUCOSE, CAPILLARY
GLUCOSE-CAPILLARY: 126 mg/dL — AB (ref 65–99)
Glucose-Capillary: 141 mg/dL — ABNORMAL HIGH (ref 65–99)

## 2017-06-21 LAB — SODIUM: Sodium: 122 mmol/L — ABNORMAL LOW (ref 135–145)

## 2017-06-21 MED ORDER — BOOST / RESOURCE BREEZE PO LIQD: 1.0000 | Freq: Two times a day (BID) | ORAL | Status: DC

## 2017-06-21 MED ORDER — AMOXICILLIN-POT CLAVULANATE 875-125 MG PO TABS: 1.0000 | ORAL_TABLET | Freq: Two times a day (BID) | ORAL | 0 refills | Status: DC

## 2017-06-21 MED ORDER — LISINOPRIL 20 MG PO TABS: 20.0000 mg | ORAL_TABLET | Freq: Every day | ORAL | Status: DC

## 2017-06-21 NOTE — Consult Note (Signed)
Date: 06/21/2017                  Patient Name:  [name]  MRN: [id number]  DOB: Apr 05, 1941  Age / Sex: 76 y.o., male         PCP: Valerie Roys, DO                 Service Requesting Consult: Hospitalist/ Hillary Bow, MD                 Reason for Consult: Hyponatremia            History of Present Illness: Patient is a 76 y.o. male with medical problems of alcohol abuse, non insulin dependent diabetes, hypertension, hypothyroidism, venous stasis with bilateral ulcers, who was admitted to [facility] on 06/20/2017 for evaluation of lower extremity cramps and bilateral lower leg open ulcers.   Pt was interviewed on the unit with his son in the room.  Pt reports drinking approximately one 18 pack of canned beer per day. Denies hx of alcohol withdrawal symptoms.Doesn't drink or eat much at home. States that the ulcers on his legs are from scratching them because the fall asleep. Reports that he takes his medications for hypothyroidism, diabetes, and HTN regularly. Says that his son said he should stop drinking.  Nephrology consulted due to hyponatremia. Initial Na was 116 when admitted. Today it is 121.   Medications: Outpatient medications: Prescriptions Prior to Admission  Medication Sig Dispense Refill Last Dose  . empagliflozin (JARDIANCE) 10 MG TABS tablet Take 10 mg by mouth daily. 90 tablet 1 06/18/2017 at 0800  . levothyroxine (SYNTHROID, LEVOTHROID) 75 MCG tablet Take 1 tablet (75 mcg total) by mouth daily before breakfast. 90 tablet 3 06/18/2017 at 0800  . lisinopril (PRINIVIL,ZESTRIL) 5 MG tablet Take 1 tablet (5 mg total) by mouth daily. 90 tablet 1 06/18/2017 at 0800  . aspirin EC 81 MG tablet Take 81 mg by mouth daily.   06/18/2017 at 0800    Current medications: Current Facility-Administered Medications  Medication Dose Route Frequency Provider Last Rate Last Dose  . 0.9 %  sodium chloride infusion   Intravenous Continuous Gladstone Lighter, MD 75 mL/hr at 06/21/17 0746     . acetaminophen (TYLENOL) tablet 650 mg  650 mg Oral Q6H PRN Gladstone Lighter, MD   650 mg at 06/20/17 1655   Or  . acetaminophen (TYLENOL) suppository 650 mg  650 mg Rectal Q6H PRN Gladstone Lighter, MD      . Ampicillin-Sulbactam (UNASYN) 3 g in sodium chloride 0.9 % 100 mL IVPB  3 g Intravenous Q6H Gladstone Lighter, MD   Stopped at 06/21/17 0845  . aspirin EC tablet 81 mg  81 mg Oral Daily Gladstone Lighter, MD   81 mg at 06/21/17 0814  . enoxaparin (LOVENOX) injection 40 mg  40 mg Subcutaneous Q24H Gladstone Lighter, MD   40 mg at 03/47/42 5956  . folic acid (FOLVITE) tablet 1 mg  1 mg Oral Daily Gladstone Lighter, MD   1 mg at 06/21/17 0815  . guaiFENesin-dextromethorphan (ROBITUSSIN DM) 100-10 MG/5ML syrup 5 mL  5 mL Oral Q4H PRN Gladstone Lighter, MD   5 mL at 06/21/17 0815  . insulin aspart (novoLOG) injection 0-5 Units  0-5 Units Subcutaneous QHS Gladstone Lighter, MD      . insulin aspart (novoLOG) injection 0-9 Units  0-9 Units Subcutaneous TID WC Gladstone Lighter, MD   1 Units at 06/21/17 0814  . levothyroxine (SYNTHROID,  LEVOTHROID) tablet 75 mcg  75 mcg Oral QAC breakfast Gladstone Lighter, MD   75 mcg at 06/21/17 0814  . [START ON 06/22/2017] lisinopril (PRINIVIL,ZESTRIL) tablet 20 mg  20 mg Oral Daily Sudini, Srikar, MD      . LORazepam (ATIVAN) injection 0-4 mg  0-4 mg Intravenous Q6H Gladstone Lighter, MD       Followed by  . [START ON 06/22/2017] LORazepam (ATIVAN) injection 0-4 mg  0-4 mg Intravenous Q12H Gladstone Lighter, MD      . LORazepam (ATIVAN) tablet 1 mg  1 mg Oral Q6H PRN Gladstone Lighter, MD       Or  . LORazepam (ATIVAN) injection 1 mg  1 mg Intravenous Q6H PRN Gladstone Lighter, MD      . multivitamin with minerals tablet 1 tablet  1 tablet Oral Daily Gladstone Lighter, MD   1 tablet at 06/21/17 0815  . ondansetron (ZOFRAN) tablet 4 mg  4 mg Oral Q6H PRN Gladstone Lighter, MD       Or  . ondansetron (ZOFRAN) injection 4 mg  4 mg Intravenous  Q6H PRN Gladstone Lighter, MD      . thiamine (VITAMIN B-1) tablet 100 mg  100 mg Oral Daily Gladstone Lighter, MD   100 mg at 06/21/17 0815   Or  . thiamine (B-1) injection 100 mg  100 mg Intravenous Daily Gladstone Lighter, MD          Allergies: Allergies  Allergen Reactions  . Metformin And Related Nausea And Vomiting      Past Medical History: Past Medical History:  Diagnosis Date  . Diabetes mellitus   . 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 Hypertension   . Hypothyroidism   . Neuropathy   . Venous stasis      Past Surgical History: Past Surgical History:  Procedure Laterality Date  . CATARACT EXTRACTION W/PHACO Right 10/25/2016   Procedure: CATARACT EXTRACTION PHACO AND INTRAOCULAR LENS PLACEMENT (IOC)  right eye complicated;  Surgeon: Ronnell Freshwater, MD;  Location: [facility];  Service: Ophthalmology;  Laterality: Right;  Right eye Diabetic - oral meds Vision blue  . FINGER SURGERY       Family History: Family History  Problem Relation Age of Onset  . Heart disease Mother   . Alzheimer's disease Sister   . Heart disease Maternal Aunt      Social History: Social History   Social History  . Marital status: Married    Spouse name: N/A  . Number of children: 2  . Years of education: N/A   Occupational History  . Custodian    Social History Main Topics  . Smoking status: Former Smoker    Packs/day: 0.25    Types: Cigarettes    Quit date: 03/22/2016  . Smokeless tobacco: Current User    Types: Chew  . Alcohol use 7.2 oz/week    12 Cans of beer per week     Comment: every day  . Drug use: No  . Sexual activity: Not on file     Comment: currently trying to quit now   Other Topics Concern  . Not on file   Social History Narrative   Lives at home with son, ambulates with a cane     Review of Systems: Gen: No chills or fevers. HEENT: No difficulty swallowing. CV: Denies chest  pain, denies symptoms of heart failure Resp: Denies shortness of breath and wheezing GI: Denies  abdominal distention, denies nausea, denies vomiting GU : Denies any kidney stones, or difficulty urinating MS: Denies any complaints Derm:  admits chronic weeping bilateral lower leg ulcers Psych: No complaints Heme: No complaints Neuro: Admits lower extremity paresthesias Endocrine: DM under good control  Vital Signs: Blood pressure (!) 140/39, pulse 78, temperature 98 F (36.7 C), temperature source Oral, resp. rate 14, height 5\' 7"  (1.702 m), weight 79.9 kg (176 lb 1.6 oz), SpO2 96 %.   Intake/Output Summary (Last 24 hours) at 06/21/17 1148 Last data filed at 06/21/17 0800  Gross per 24 hour  Intake          1511.25 ml  Output                0 ml  Net          1511.25 ml    Weight trends: [employer]   06/20/17 0809 06/20/17 1229  Weight: 84.8 kg (187 lb) 79.9 kg (176 lb 1.6 oz)    Physical Exam: General:  Pt is sitting up and watching TV  HEENT Pt uvula midline, no rash, erythema, swelling  Neck:  No venous distention present  Lungs: Clear to auscultation bilaterally, normal respiratory effort  Heart::  Regular rate and rhythym  Abdomen: Distended and tight, notable area of venous distention, no tenderness to palpation  Extremities: + dependent edema  Neurologic: Alert and talking  Skin:  Bilateral weeping ulcerations on medial lower extremity bilaterally             Lab results: Basic Metabolic Panel:  Recent Labs Lab 06/20/17 0829  06/20/17 1846 06/21/17 0024 06/21/17 0532  NA 116*  < > 119* 122* 121*  K 4.6  --   --   --  4.7  CL 81*  --   --   --  90*  CO2 24  --   --   --  24  GLUCOSE 150*  --   --   --  127*  BUN 15  --   --   --  23*  CREATININE 0.96  --   --   --  1.02  CALCIUM 9.3  --   --   --  8.5*  < > = values in this interval not displayed.  Liver Function Tests:  Recent Labs Lab 06/20/17 0829  AST 107*  ALT 51  ALKPHOS 289*   BILITOT 3.4*  PROT 6.9  ALBUMIN 4.0   No results for input(s): LIPASE, AMYLASE in the last 168 hours. No results for input(s): AMMONIA in the last 168 hours.  CBC:  Recent Labs Lab 06/20/17 0829 06/21/17 0532  WBC 6.7 4.3  HGB 11.6* 8.9*  HCT 31.3* 24.4*  MCV 112.6* 114.0*  PLT 98* 80*    Cardiac Enzymes: No results for input(s): CKTOTAL, TROPONINI in the last 168 hours.  BNP: Invalid input(s): POCBNP  CBG:  Recent Labs Lab 06/20/17 1231 06/20/17 1639 06/20/17 2104 06/21/17 0746  GLUCAP 145* 112* 138* 126*    Microbiology: Recent Results (from the past 720 hour(s))  Blood culture (routine x 2)     Status: None (Preliminary result)   Collection Time: 06/20/17  8:30 AM  Result Value Ref Range Status   Specimen Description BLOOD LEFT ARM  Final   Special Requests   Final    BOTTLES DRAWN AEROBIC AND ANAEROBIC Blood Culture adequate volume   Culture NO GROWTH < 24 HOURS  Final   Report Status PENDING  Incomplete  Blood  culture (routine x 2)     Status: None (Preliminary result)   Collection Time: 06/20/17 10:03 AM  Result Value Ref Range Status   Specimen Description BLOOD LEFT FA  Final   Special Requests   Final    BOTTLES DRAWN AEROBIC AND ANAEROBIC Blood Culture adequate volume   Culture NO GROWTH < 24 HOURS  Final   Report Status PENDING  Incomplete     Coagulation Studies: No results for input(s): LABPROT, INR in the last 72 hours.  Urinalysis: No results for input(s): COLORURINE, LABSPEC, PHURINE, GLUCOSEU, HGBUR, BILIRUBINUR, KETONESUR, PROTEINUR, UROBILINOGEN, NITRITE, LEUKOCYTESUR in the last 72 hours.  Invalid input(s): APPERANCEUR      Imaging:  No results found.   Assessment & Plan: Pt is a 76 y.o.   male with a PMHX of alcohol abuse, non insulin dependent diabetes, hypertension, hypothyroidism, venous stasis with bilateral ulcers, who was admitted to [facility] on 06/20/2017 for evaluation of lower extremity cramps and bilateral lower leg  open ulcers.   Hyponatremia - Initial Na on admission was 116 with some improvement to 121 today. Most likely hepatic/alchohol origin. - Ordered repeat Na to be done this afternoon, pt requesting discharge today.  Hypertension and Lower Extremity Edema - Pt states he is compliant with medications. BP today 170/67 - May benefit from adding furosemide as outpatient  Ascites - Low salt diet, counseled to discontinue alcohol use - Consider addition of furosemide as outpatient

## 2017-06-21 NOTE — Progress Notes (Signed)
Applied zinc and coban wrap with no stretch at 2030 9/10.  Pt complained of burning and demanded that wraps be cut off.  Removed wraps.  Pt has bleeding and weeping from bilateral lower extremities, greater on the right. Dorna Bloom RN
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
## 2017-06-21 NOTE — Plan of Care (Signed)
Problem: Skin Integrity: Goal: Risk for impaired skin integrity will decrease Outcome: Not Progressing Pt noncompliant with wound care  Problem: Tissue Perfusion: Goal: Risk factors for ineffective tissue perfusion will decrease Outcome: Not Progressing Edema not controlled secondary to noncompliance with leg wrap  Problem: Skin Integrity: Goal: Skin integrity will improve Outcome: Not Progressing Noncompliant with wound care

## 2017-06-21 NOTE — Plan of Care (Signed)
Problem: Skin Integrity: Goal: Risk for impaired skin integrity will decrease Outcome: Not Progressing Patient refusing wound care.

## 2017-06-21 NOTE — Progress Notes (Signed)
Patient signed AMA form. MD is aware. Madlyn Frankel, RN

## 2017-06-25 LAB — CULTURE, BLOOD (ROUTINE X 2)
CULTURE: NO GROWTH
CULTURE: NO GROWTH
SPECIAL REQUESTS: ADEQUATE
SPECIAL REQUESTS: ADEQUATE

## 2017-06-25 NOTE — Discharge Summary (Signed)
[facility] at [place] NAME: [name]    MR#:  [id number]  DATE OF BIRTH:  [birth year]  DATE OF ADMISSION:  06/20/2017 ADMITTING PHYSICIAN: Gladstone Lighter, MD  DATE OF DISCHARGE: 06/21/2017  1:08 PM  PRIMARY CARE PHYSICIAN: Park Liter P, DO   ADMISSION DIAGNOSIS:  Acute hyponatremia [E87.1] Venous stasis dermatitis of both lower extremities [I87.2]  DISCHARGE DIAGNOSIS:  Active Problems:   Hyponatremia   SECONDARY DIAGNOSIS:   Past Medical History:  Diagnosis Date  . Diabetes mellitus   . 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 Hypertension   . Hypothyroidism   . Neuropathy   . Venous stasis      ADMITTING HISTORY  HISTORY OF PRESENT ILLNESS:  [name]  is a 76 y.o. male with a known history of on-insulin-dependent diabetes mellitus, hypertension, hypothyroidism, neuropathy, chronic venous stasis with bilateral leg ulcerations, ongoing alcohol abuse presents to the hospital secondary to lower extremity cramps and open ulcers. Patient has had venous stasis ulcers for a long time, just worsened with more erythema, redness and weeping ulcers for 2 weeks now. Since symptoms are getting worse, he presented to the emergency room. Also complains of some lightheadedness and dizziness. Labs indicate a sodium of 116. He says he drinks very minimal amount of water less than 250 cc per whole day, reduced solid intake as well. Drinks at least up to 20 beers per day. Initially he refused to stay in the hospital. After talking to his son, he agreed. Denies any nausea or vomiting. Has chronic leg cramps.No fevers or chills.   HOSPITAL COURSE:   * hyponatremia due to dehydration and beer * lower extremity cellulitis with chronic stasis changes * Insulin-dependent diabetes mellitus * hypertension * chronic pancy * liver cirrhosis * alcohol abuse  Patient was admitted to medical floor and started  on IV fluids. Seen by nephrology. Sodium slowly improved. Also started on IV Unasyn for his lower extremity cellulitis. On day 2 of hospital stay patient did not want to stay further in the hospital in spite off pseudohyponatremia. Family was at bedside. Advised staying but patient wanted to leave [facility]. Patient has signed papers for leaving AMA. Prescription given for Augmentin for his lower extremity cellulitis. Counseled on multiple occasions to quit alcohol and beer. Advised to return if there is any worsning.  CONSULTS OBTAINED:  Treatment Team:  Murlean Iba, MD  DRUG ALLERGIES:   Allergies  Allergen Reactions  . Metformin And Related Nausea And Vomiting    DISCHARGE MEDICATIONS:   Discharge Medication List as of 06/21/2017  1:08 PM    START taking these medications   Details  amoxicillin-clavulanate (AUGMENTIN) 875-125 MG tablet Take 1 tablet by mouth 2 (two) times daily., Starting Tue 06/21/2017, Normal      CONTINUE these medications which have NOT CHANGED   Details  empagliflozin (JARDIANCE) 10 MG TABS tablet Take 10 mg by mouth daily., Starting Wed 03/30/2017, Normal    levothyroxine (SYNTHROID, LEVOTHROID) 75 MCG tablet Take 1 tablet (75 mcg total) by mouth daily before breakfast., Starting Wed 09/22/2016, Normal    lisinopril (PRINIVIL,ZESTRIL) 5 MG tablet Take 1 tablet (5 mg total) by mouth daily., Starting Wed 03/30/2017, Normal    aspirin EC 81 MG tablet Take 81 mg by mouth daily., Historical Med        Today   VITAL SIGNS:  Blood pressure (!) 140/39, pulse  78, temperature 98 F (36.7 C), temperature source Oral, resp. rate 14, height 5\' 7"  (1.702 m), weight 79.9 kg (176 lb 1.6 oz), SpO2 96 %.  I/O:  No intake or output data in the 24 hours ending 06/25/17 1150  PHYSICAL EXAMINATION:  Physical Exam  GENERAL:  76 y.o.-year-old patient lying in the bed with no acute distress.  LUNGS: Normal breath sounds bilaterally, no wheezing,  rales,rhonchi or crepitation. No use of accessory muscles of respiration.  CARDIOVASCULAR: S1, S2 normal. No murmurs, rubs, or gallops.  ABDOMEN: Soft, non-tender, non-distended. Bowel sounds present. No organomegaly or mass.  NEUROLOGIC: Moves all 4 extremities. PSYCHIATRIC: The patient is alert and oriented x 3.  SKIN: No obvious rash, lesion, or ulcer.   DATA REVIEW:   CBC  Recent Labs Lab 06/21/17 0532  WBC 4.3  HGB 8.9*  HCT 24.4*  PLT 80*    Chemistries   Recent Labs Lab 06/20/17 0829  06/21/17 0532  NA 116*  < > 121*  K 4.6  --  4.7  CL 81*  --  90*  CO2 24  --  24  GLUCOSE 150*  --  127*  BUN 15  --  23*  CREATININE 0.96  --  1.02  CALCIUM 9.3  --  8.5*  AST 107*  --   --   ALT 51  --   --   ALKPHOS 289*  --   --   BILITOT 3.4*  --   --   < > = values in this interval not displayed.  Cardiac Enzymes No results for input(s): TROPONINI in the last 168 hours.  Microbiology Results  Results for orders placed or performed during the hospital encounter of 06/20/17  Blood culture (routine x 2)     Status: None   Collection Time: 06/20/17  8:30 AM  Result Value Ref Range Status   Specimen Description BLOOD LEFT ARM  Final   Special Requests   Final    BOTTLES DRAWN AEROBIC AND ANAEROBIC Blood Culture adequate volume   Culture NO GROWTH 5 DAYS  Final   Report Status 06/25/2017 FINAL  Final  Blood culture (routine x 2)     Status: None   Collection Time: 06/20/17 10:03 AM  Result Value Ref Range Status   Specimen Description BLOOD LEFT FA  Final   Special Requests   Final    BOTTLES DRAWN AEROBIC AND ANAEROBIC Blood Culture adequate volume   Culture NO GROWTH 5 DAYS  Final   Report Status 06/25/2017 FINAL  Final    RADIOLOGY:  No results found.  Follow up with PCP in 1 week.  Management plans discussed with the patient, family and they are in agreement.  CODE STATUS:  Code Status History    Date Active Date Inactive Code Status Order ID Comments  User Context   06/20/2017 12:30 PM 06/21/2017  4:13 PM Full Code [id number]  Gladstone Lighter, MD Inpatient      TOTAL TIME TAKING CARE OF THIS PATIENT ON DAY OF DISCHARGE: more than 30 minutes.   Hillary Bow R M.D on 06/25/2017 at 11:50 AM  Between 7am to 6pm - Pager - [phone]  After 6pm go to [email or web address] - password [email or web address] [facility]  Office  [phone]  CC: Primary care physician; Valerie Roys, DO  Note: This dictation was prepared with Dragon dictation along with smaller phrase technology. Any transcriptional errors that result from this process are unintentional.

## 2017-07-01 ENCOUNTER — Ambulatory Visit (INDEPENDENT_AMBULATORY_CARE_PROVIDER_SITE_OTHER): Payer: Medicare Other | Admitting: Family Medicine

## 2017-07-01 ENCOUNTER — Telehealth: Payer: Self-pay

## 2017-07-01 ENCOUNTER — Encounter: Payer: Self-pay | Admitting: Family Medicine

## 2017-07-01 VITALS — BP 111/46 | HR 78 | Temp 98.6°F | Wt 171.8 lb

## 2017-07-01 DIAGNOSIS — E1165 Type 2 diabetes mellitus with hyperglycemia: Secondary | ICD-10-CM | POA: Diagnosis not present

## 2017-07-01 DIAGNOSIS — E871 Hypo-osmolality and hyponatremia: Secondary | ICD-10-CM | POA: Diagnosis not present

## 2017-07-01 DIAGNOSIS — S81801A Unspecified open wound, right lower leg, initial encounter: Secondary | ICD-10-CM | POA: Diagnosis not present

## 2017-07-01 DIAGNOSIS — G6289 Other specified polyneuropathies: Secondary | ICD-10-CM | POA: Diagnosis not present

## 2017-07-01 DIAGNOSIS — R5381 Other malaise: Secondary | ICD-10-CM

## 2017-07-01 DIAGNOSIS — N179 Acute kidney failure, unspecified: Secondary | ICD-10-CM | POA: Diagnosis not present

## 2017-07-01 DIAGNOSIS — IMO0002 Reserved for concepts with insufficient information to code with codable children: Secondary | ICD-10-CM

## 2017-07-01 DIAGNOSIS — L03119 Cellulitis of unspecified part of limb: Secondary | ICD-10-CM | POA: Diagnosis not present

## 2017-07-01 DIAGNOSIS — E1140 Type 2 diabetes mellitus with diabetic neuropathy, unspecified: Secondary | ICD-10-CM | POA: Diagnosis not present

## 2017-07-01 DIAGNOSIS — G629 Polyneuropathy, unspecified: Secondary | ICD-10-CM | POA: Insufficient documentation

## 2017-07-01 MED ORDER — ONDANSETRON HCL 8 MG PO TABS: ORAL_TABLET | ORAL | 0 refills | Status: DC

## 2017-07-01 MED ORDER — SULFAMETHOXAZOLE-TRIMETHOPRIM 800-160 MG PO TABS: 1.0000 | ORAL_TABLET | Freq: Two times a day (BID) | ORAL | 0 refills | Status: DC

## 2017-07-01 MED ORDER — LEVOTHYROXINE SODIUM 75 MCG PO TABS
75.0000 ug | ORAL_TABLET | Freq: Every day | ORAL | 3 refills | Status: DC
Start: 2017-07-01 — End: 2017-08-16

## 2017-07-01 NOTE — Assessment & Plan Note (Signed)
Rechecking levels today. Await results.  

## 2017-07-01 NOTE — Progress Notes (Signed)
BP (!) 111/46 (BP Location: Left Arm, Patient Position: Sitting)   Pulse 78   Temp 98.6 F (37 C)   Wt 171 lb 12.8 oz (77.9 kg)   BMI 26.91 kg/m    Subjective:    Patient ID: [name], male    DOB: Mar 02, 1941, 76 y.o.   MRN: [id number]  HPI: [name] is a 76 y.o. male  No chief complaint on file.  HOSPITAL FOLLOW UP Time since discharge: 10 days- left AMA Hospital/facility: [facility] Diagnosis: Acute hyponatremia, cellulitis, bilateral leg ulcerations, alcohol abuse, beer portomania  Procedures/tests:  Consultants: Nephrology New medications: IV unasyn x 2days, left AMA, started on PO augmentin at home. Made him vomit, took 1 pill only Discharge instructions:  Follow up here Status: worse   DIABETES Hypoglycemic episodes:no Polydipsia/polyuria: yes Visual disturbance: no Chest pain: no Paresthesias: no Glucose Monitoring: no Taking Insulin?: no Blood Pressure Monitoring: not checking Retinal Examination: Up to Date Foot Exam: Up to Date Diabetic Education: Not Completed Pneumovax: Not up to Date Influenza: Not up to Date Aspirin: yes  SKIN INFECTION Duration: weeks to months. Patient thinks that the wounds have been there about 2 months, the redness started about 2 weeks ago, which is why he went to the ER Location: bilateral lower extremities History of trauma in area: no Pain: no Severity: moderate Redness: yes Swelling: yes Oozing: yes Pus: no Fevers: no Nausea/vomiting: no Status: worse Treatments attempted:antibiotics  Tetanus: UTD  Relevant past medical, surgical, family and social history reviewed and updated as indicated. Interim medical history since our last visit reviewed. Allergies and medications reviewed and updated.  Review of Systems  Constitutional: Positive for fatigue. Negative for activity change, appetite change, chills, diaphoresis, fever and unexpected weight change.  Respiratory: Negative.   Cardiovascular: Negative.     Skin: Positive for color change and wound. Negative for pallor and rash.  Neurological: Positive for weakness. Negative for dizziness, tremors, seizures, syncope, facial asymmetry, speech difficulty, light-headedness, numbness and headaches.  Psychiatric/Behavioral: Negative.     Per HPI unless specifically indicated above     Objective:    BP (!) 111/46 (BP Location: Left Arm, Patient Position: Sitting)   Pulse 78   Temp 98.6 F (37 C)   Wt 171 lb 12.8 oz (77.9 kg)   BMI 26.91 kg/m   Wt Readings from Last 3 Encounters:  07/01/17 171 lb 12.8 oz (77.9 kg)  06/20/17 176 lb 1.6 oz (79.9 kg)  03/30/17 187 lb (84.8 kg)    Physical Exam  Constitutional: He is oriented to person, place, and time. He appears well-developed and well-nourished. No distress.  HENT:  Head: Normocephalic and atraumatic.  Right Ear: Hearing normal.  Left Ear: Hearing normal.  Nose: Nose normal.  Eyes: Conjunctivae and lids are normal. Right eye exhibits no discharge. Left eye exhibits no discharge. No scleral icterus.  Cardiovascular: Normal rate, regular rhythm, normal heart sounds and intact distal pulses.  Exam reveals no gallop and no friction rub.   No murmur heard. Pulmonary/Chest: Effort normal and breath sounds normal. No respiratory distress. He has no wheezes. He has no rales. He exhibits no tenderness.  Musculoskeletal: Normal range of motion.  Neurological: He is alert and oriented to person, place, and time.  Skin: Skin is warm, dry and intact. No rash noted. He is not diaphoretic. There is erythema. No pallor.  Erythematous, swollen legs bilaterally from knees down. Skin break down- see photographs. Wound on medial portion of R ankle  Psychiatric: He has a normal mood and affect. His speech is normal and behavior is normal. Judgment and thought content normal. Cognition and memory are normal.  Nursing note and vitals reviewed.   Results for orders placed or performed in visit on 07/01/17   CBC With Differential/Platelet  Result Value Ref Range   WBC 6.6 3.4 - 10.8 x10E3/uL   RBC 2.33 (LL) 4.14 - 5.80 x10E6/uL   Hemoglobin 9.6 (L) 13.0 - 17.7 g/dL   Hematocrit 25.6 (L) 37.5 - 51.0 %   MCV 110 (H) 79 - 97 fL   MCH 41.2 (H) 26.6 - 33.0 pg   MCHC 37.5 (H) 31.5 - 35.7 g/dL   RDW 12.4 12.3 - 15.4 %   Platelets 117 (L) 150 - 379 x10E3/uL   Neutrophils 83 Not Estab. %   Lymphs 7 Not Estab. %   MID 10 Not Estab. %   Neutrophils Absolute 5.5 1.4 - 7.0 x10E3/uL   Lymphocytes Absolute 0.4 (L) 0.7 - 3.1 x10E3/uL   MID (Absolute) 0.7 0.1 - 1.6 X10E3/uL      Assessment & Plan:   Problem List Items Addressed This Visit      Endocrine   Type 2 diabetes, uncontrolled, with neuropathy (HCC)    A1c down to 5.1 in the hospital. Will stop jardiance and recheck 3 months. Call with any concerns.         Nervous and Auditory   Peripheral neuropathy     Other   Hyponatremia    Rechecking levels today. Await results.       Relevant Orders   Comprehensive metabolic panel    Other Visit Diagnoses    AKI (acute kidney injury) ([place])    -  Primary   Rechecking levels today. Await results.    Relevant Orders   Comprehensive metabolic panel   Cellulitis of lower extremity, unspecified laterality       Did not tolerate augmentin. Will start bactrim- aware of the risk of vomiting. To take zofran prior to taking it. Recheck 3-4 days   Relevant Orders   CBC With Differential/Platelet (Completed)   Ambulatory referral to [facility]   Ambulatory referral to [facility] wound of right lower leg, initial encounter       Referral to home health and wound care made today. Wounds dressed today and debrided with water. Call with any concerns.    Relevant Orders   Ambulatory referral to [facility]   Ambulatory referral to Home Health   Physical deconditioning       Will get home PT. Has weakened with his recent hospitalization and illness.    Relevant Orders   Ambulatory  referral to [facility]       Follow up plan: Return Monday or Tuesday, for Wound check.

## 2017-07-01 NOTE — Telephone Encounter (Signed)
Called [facility] to see if someone could come out this weekend to see patient, they don't have any one available this weekend but will send someone asap.  Faxed referral to [facility] for PT/wound care and home health aide.

## 2017-07-01 NOTE — Assessment & Plan Note (Signed)
A1c down to 5.1 in the hospital. Will stop jardiance and recheck 3 months. Call with any concerns.

## 2017-07-04 DIAGNOSIS — N181 Chronic kidney disease, stage 1: Secondary | ICD-10-CM | POA: Diagnosis not present

## 2017-07-04 DIAGNOSIS — E1122 Type 2 diabetes mellitus with diabetic chronic kidney disease: Secondary | ICD-10-CM | POA: Diagnosis not present

## 2017-07-04 DIAGNOSIS — E1142 Type 2 diabetes mellitus with diabetic polyneuropathy: Secondary | ICD-10-CM | POA: Diagnosis not present

## 2017-07-04 DIAGNOSIS — I129 Hypertensive chronic kidney disease with stage 1 through stage 4 chronic kidney disease, or unspecified chronic kidney disease: Secondary | ICD-10-CM | POA: Diagnosis not present

## 2017-07-04 DIAGNOSIS — L03115 Cellulitis of right lower limb: Secondary | ICD-10-CM | POA: Diagnosis not present

## 2017-07-04 LAB — COMPREHENSIVE METABOLIC PANEL
A/G RATIO: 1.7 (ref 1.2–2.2)
ALT: 56 IU/L — AB (ref 0–44)
AST: 89 IU/L — AB (ref 0–40)
Albumin: 3.4 g/dL — ABNORMAL LOW (ref 3.5–4.8)
Alkaline Phosphatase: 268 IU/L — ABNORMAL HIGH (ref 39–117)
BILIRUBIN TOTAL: 1.3 mg/dL — AB (ref 0.0–1.2)
BUN/Creatinine Ratio: 34 — ABNORMAL HIGH (ref 10–24)
BUN: 44 mg/dL — AB (ref 8–27)
CHLORIDE: 92 mmol/L — AB (ref 96–106)
CO2: 20 mmol/L (ref 20–29)
Calcium: 8.8 mg/dL (ref 8.6–10.2)
Creatinine, Ser: 1.28 mg/dL — ABNORMAL HIGH (ref 0.76–1.27)
GFR calc Af Amer: 62 mL/min/{1.73_m2} (ref >59)
GFR calc non Af Amer: 54 mL/min/{1.73_m2} — ABNORMAL LOW (ref >59)
GLUCOSE: 142 mg/dL — AB (ref 65–99)
Globulin, Total: 2 g/dL (ref 1.5–4.5)
POTASSIUM: 4.1 mmol/L (ref 3.5–5.2)
Sodium: 129 mmol/L — ABNORMAL LOW (ref 134–144)
TOTAL PROTEIN: 5.4 g/dL — AB (ref 6.0–8.5)

## 2017-07-04 LAB — SPECIMEN STATUS REPORT

## 2017-07-05 ENCOUNTER — Encounter: Payer: Self-pay | Admitting: Family Medicine

## 2017-07-05 ENCOUNTER — Ambulatory Visit (INDEPENDENT_AMBULATORY_CARE_PROVIDER_SITE_OTHER): Payer: Medicare Other | Admitting: Family Medicine

## 2017-07-05 ENCOUNTER — Ambulatory Visit: Payer: Medicare Other | Admitting: Family Medicine

## 2017-07-05 VITALS — BP 116/57 | HR 67 | Temp 98.1°F | Wt 173.2 lb

## 2017-07-05 DIAGNOSIS — I129 Hypertensive chronic kidney disease with stage 1 through stage 4 chronic kidney disease, or unspecified chronic kidney disease: Secondary | ICD-10-CM | POA: Diagnosis not present

## 2017-07-05 DIAGNOSIS — L03115 Cellulitis of right lower limb: Secondary | ICD-10-CM | POA: Diagnosis not present

## 2017-07-05 DIAGNOSIS — E1122 Type 2 diabetes mellitus with diabetic chronic kidney disease: Secondary | ICD-10-CM | POA: Diagnosis not present

## 2017-07-05 DIAGNOSIS — N181 Chronic kidney disease, stage 1: Secondary | ICD-10-CM | POA: Diagnosis not present

## 2017-07-05 DIAGNOSIS — E1142 Type 2 diabetes mellitus with diabetic polyneuropathy: Secondary | ICD-10-CM | POA: Diagnosis not present

## 2017-07-05 DIAGNOSIS — S81801D Unspecified open wound, right lower leg, subsequent encounter: Secondary | ICD-10-CM | POA: Diagnosis not present

## 2017-07-05 DIAGNOSIS — L03119 Cellulitis of unspecified part of limb: Secondary | ICD-10-CM | POA: Diagnosis not present

## 2017-07-05 NOTE — Progress Notes (Signed)
BP (!) 116/57 (BP Location: Left Arm, Patient Position: Sitting, Cuff Size: Normal)   Pulse 67   Temp 98.1 F (36.7 C)   Wt 173 lb 4 oz (78.6 kg)   SpO2 99%   BMI 27.13 kg/m    Subjective:    Patient ID: [name], male    DOB: [birth year], 76 y.o.   MRN: [id number]  HPI: [name] is a 76 y.o. male  Chief Complaint  Patient presents with  . Wound Check   Tolerating medicine well. Has not been vomiting on the bactrim. Pain is less for him today. Smell is less today. Wounds doing well with the wrapping. Home health came out yesterday for initial evaluation. Have not been doing dressing changes at home yet. Otherwise feeling well with no other concerns or complaints today.   Relevant past medical, surgical, family and social history reviewed and updated as indicated. Interim medical history since our last visit reviewed. Allergies and medications reviewed and updated.  Review of Systems  Constitutional: Negative.   Respiratory: Negative.   Cardiovascular: Positive for leg swelling. Negative for chest pain and palpitations.  Skin: Positive for color change and wound. Negative for pallor and rash.  Psychiatric/Behavioral: Negative.     Per HPI unless specifically indicated above     Objective:    BP (!) 116/57 (BP Location: Left Arm, Patient Position: Sitting, Cuff Size: Normal)   Pulse 67   Temp 98.1 F (36.7 C)   Wt 173 lb 4 oz (78.6 kg)   SpO2 99%   BMI 27.13 kg/m   Wt Readings from Last 3 Encounters:  07/05/17 173 lb 4 oz (78.6 kg)  07/01/17 171 lb 12.8 oz (77.9 kg)  06/20/17 176 lb 1.6 oz (79.9 kg)    Physical Exam  Constitutional: He is oriented to person, place, and time. He appears well-developed and well-nourished. No distress.  HENT:  Head: Normocephalic and atraumatic.  Right Ear: Hearing normal.  Left Ear: Hearing normal.  Nose: Nose normal.  Eyes: Conjunctivae and lids are normal. Right eye exhibits no discharge. Left eye exhibits no  discharge. No scleral icterus.  Pulmonary/Chest: Effort normal. No respiratory distress.  Musculoskeletal: Normal range of motion.  Neurological: He is alert and oriented to person, place, and time.  Skin: Skin is intact. No rash noted. He is not diaphoretic.  Erythematous, swollen legs bilaterally from knees down. Skin break down- see photographs. Wound on medial portion of R ankle Improved from prior  Psychiatric: He has a normal mood and affect. His speech is normal and behavior is normal. Judgment and thought content normal. Cognition and memory are normal.  Nursing note and vitals reviewed.   Results for orders placed or performed in visit on 07/01/17  CBC With Differential/Platelet  Result Value Ref Range   WBC 6.6 3.4 - 10.8 x10E3/uL   RBC 2.33 (LL) 4.14 - 5.80 x10E6/uL   Hemoglobin 9.6 (L) 13.0 - 17.7 g/dL   Hematocrit 25.6 (L) 37.5 - 51.0 %   MCV 110 (H) 79 - 97 fL   MCH 41.2 (H) 26.6 - 33.0 pg   MCHC 37.5 (H) 31.5 - 35.7 g/dL   RDW 12.4 12.3 - 15.4 %   Platelets 117 (L) 150 - 379 x10E3/uL   Neutrophils 83 Not Estab. %   Lymphs 7 Not Estab. %   MID 10 Not Estab. %   Neutrophils Absolute 5.5 1.4 - 7.0 x10E3/uL   Lymphocytes Absolute 0.4 (L) 0.7 - 3.1  x10E3/uL   MID (Absolute) 0.7 0.1 - 1.6 X10E3/uL  Comprehensive metabolic panel  Result Value Ref Range   Glucose 142 (H) 65 - 99 mg/dL   BUN 44 (H) 8 - 27 mg/dL   Creatinine, Ser 1.28 (H) 0.76 - 1.27 mg/dL   GFR calc non Af Amer 54 (L) >59 mL/min/1.73   GFR calc Af Amer 62 >59 mL/min/1.73   BUN/Creatinine Ratio 34 (H) 10 - 24   Sodium 129 (L) 134 - 144 mmol/L   Potassium 4.1 3.5 - 5.2 mmol/L   Chloride 92 (L) 96 - 106 mmol/L   CO2 20 20 - 29 mmol/L   Calcium 8.8 8.6 - 10.2 mg/dL   Total Protein 5.4 (L) 6.0 - 8.5 g/dL   Albumin 3.4 (L) 3.5 - 4.8 g/dL   Globulin, Total 2.0 1.5 - 4.5 g/dL   Albumin/Globulin Ratio 1.7 1.2 - 2.2   Bilirubin Total 1.3 (H) 0.0 - 1.2 mg/dL   Alkaline Phosphatase 268 (H) 39 - 117 IU/L   AST  89 (H) 0 - 40 IU/L   ALT 56 (H) 0 - 44 IU/L  Specimen status report  Result Value Ref Range   specimen status report Comment       Assessment & Plan:   Problem List Items Addressed This Visit    None    Visit Diagnoses    Open wound of right lower leg, subsequent encounter    -  Primary   Light debridement done, dressing changed. Seeing wound on Friday. Return for dressing change on Thursday.   Cellulitis of lower extremity, unspecified laterality       Improving. Tolerating bactrim. Continue to monitor. Call with any concerns.        Follow up plan: Return Thursday, for Dressing change.

## 2017-07-06 ENCOUNTER — Telehealth: Payer: Self-pay | Admitting: Family Medicine

## 2017-07-06 NOTE — Telephone Encounter (Signed)
Would like to get verbal order to start PT with patient. Will be seeing patient twice a week for 4 weeks. ! Time a week for 1 week. Total of 9 visits. Will be working on lower extremities, strengthen, balance, gate training, and home exercise program.  Would like to request prescription for 3- in- 1 commode to be faxed to [facility].  [facility] : [phone].  Please Advise  Thank you

## 2017-07-07 ENCOUNTER — Encounter: Payer: Self-pay | Admitting: Family Medicine

## 2017-07-07 ENCOUNTER — Ambulatory Visit: Payer: Medicare Other | Admitting: Family Medicine

## 2017-07-07 VITALS — BP 124/85 | HR 70 | Temp 97.3°F

## 2017-07-07 DIAGNOSIS — S81801D Unspecified open wound, right lower leg, subsequent encounter: Secondary | ICD-10-CM

## 2017-07-07 DIAGNOSIS — L03119 Cellulitis of unspecified part of limb: Secondary | ICD-10-CM

## 2017-07-07 NOTE — Telephone Encounter (Signed)
Routing to provider  

## 2017-07-07 NOTE — Telephone Encounter (Signed)
OK to give verbal. Rx written.

## 2017-07-07 NOTE — Telephone Encounter (Signed)
Called and left a message for [place] to return my call.

## 2017-07-07 NOTE — Telephone Encounter (Signed)
Order given

## 2017-07-07 NOTE — Progress Notes (Signed)
Wounds healing well. Erythema decreased. Dressings changed again today- to see wound center tomorrow. Call with any concerns.

## 2017-07-08 ENCOUNTER — Telehealth: Payer: Self-pay | Admitting: Family Medicine

## 2017-07-08 ENCOUNTER — Encounter: Payer: Medicare Other | Attending: Physician Assistant | Admitting: Physician Assistant

## 2017-07-08 ENCOUNTER — Other Ambulatory Visit
Admission: RE | Admit: 2017-07-08 | Discharge: 2017-07-08 | Disposition: A | Discharge status: Home / Self Care | Payer: Medicare Other | Source: Ambulatory Visit | Attending: Physician Assistant | Admitting: Physician Assistant

## 2017-07-08 DIAGNOSIS — L03115 Cellulitis of right lower limb: Secondary | ICD-10-CM | POA: Diagnosis not present

## 2017-07-08 DIAGNOSIS — L97812 Non-pressure chronic ulcer of other part of right lower leg with fat layer exposed: Secondary | ICD-10-CM | POA: Insufficient documentation

## 2017-07-08 DIAGNOSIS — L97521 Non-pressure chronic ulcer of other part of left foot limited to breakdown of skin: Secondary | ICD-10-CM | POA: Insufficient documentation

## 2017-07-08 DIAGNOSIS — L089 Local infection of the skin and subcutaneous tissue, unspecified: Secondary | ICD-10-CM | POA: Insufficient documentation

## 2017-07-08 DIAGNOSIS — I10 Essential (primary) hypertension: Secondary | ICD-10-CM | POA: Insufficient documentation

## 2017-07-08 DIAGNOSIS — Z7984 Long term (current) use of oral hypoglycemic drugs: Secondary | ICD-10-CM | POA: Insufficient documentation

## 2017-07-08 DIAGNOSIS — Z87891 Personal history of nicotine dependence: Secondary | ICD-10-CM | POA: Insufficient documentation

## 2017-07-08 DIAGNOSIS — I890 Lymphedema, not elsewhere classified: Secondary | ICD-10-CM | POA: Diagnosis not present

## 2017-07-08 DIAGNOSIS — E1142 Type 2 diabetes mellitus with diabetic polyneuropathy: Secondary | ICD-10-CM | POA: Diagnosis not present

## 2017-07-08 DIAGNOSIS — E1122 Type 2 diabetes mellitus with diabetic chronic kidney disease: Secondary | ICD-10-CM | POA: Diagnosis not present

## 2017-07-08 DIAGNOSIS — E039 Hypothyroidism, unspecified: Secondary | ICD-10-CM | POA: Insufficient documentation

## 2017-07-08 DIAGNOSIS — S91302A Unspecified open wound, left foot, initial encounter: Secondary | ICD-10-CM | POA: Diagnosis not present

## 2017-07-08 DIAGNOSIS — E11621 Type 2 diabetes mellitus with foot ulcer: Secondary | ICD-10-CM | POA: Diagnosis not present

## 2017-07-08 DIAGNOSIS — I129 Hypertensive chronic kidney disease with stage 1 through stage 4 chronic kidney disease, or unspecified chronic kidney disease: Secondary | ICD-10-CM | POA: Diagnosis not present

## 2017-07-08 DIAGNOSIS — E11622 Type 2 diabetes mellitus with other skin ulcer: Secondary | ICD-10-CM | POA: Diagnosis not present

## 2017-07-08 DIAGNOSIS — N181 Chronic kidney disease, stage 1: Secondary | ICD-10-CM | POA: Diagnosis not present

## 2017-07-08 NOTE — Telephone Encounter (Signed)
Received a call from Angie with [facility] regarding the order for bedside commode for patient.  They needed more information faxed.  Faxed the information to Attn: Angie [phone]  Just FYI

## 2017-07-08 NOTE — Telephone Encounter (Signed)
Routing to provider  

## 2017-07-08 NOTE — Telephone Encounter (Signed)
Noted  

## 2017-07-08 NOTE — Telephone Encounter (Signed)
Done

## 2017-07-10 NOTE — Progress Notes (Addendum)
DEMITRIS, POKORNY ([id number]) Visit Report for 07/08/2017 Chief Complaint Document Details Patient Name: [name], [name] Date of Service: 07/08/2017 8:00 AM Medical Record Number: [id number] Patient Account Number: [id number] Date of Birth/Sex: Mar 04, 1941 (76 y.o. Male) Treating RN: Cornell Barman Primary Care Provider: Park Liter Other Clinician: Referring Provider: Gladstone Lighter Treating Provider/Extender: Melburn Hake, Lleyton Byers Weeks in Treatment: 0 Information Obtained from: Patient Chief Complaint Bilateral LE Lymphedema with left Lower Extermity Ulcer and right Forefoot Maceration Electronic Signature(s) Signed: 07/08/2017 5:13:37 PM By: Worthy Keeler PA-C Entered By: Worthy Keeler on 07/08/2017 13:37:45 [place], [name] ([id number]) -------------------------------------------------------------------------------- HPI Details Patient Name: [name] Date of Service: 07/08/2017 8:00 AM Medical Record Number: [id number] Patient Account Number: [id number] Date of Birth/Sex: January 08, 1941 (76 y.o. Male) Treating RN: Cornell Barman Primary Care Provider: Park Liter Other Clinician: Referring Provider: Gladstone Lighter Treating Provider/Extender: Melburn Hake, Mitsuye Schrodt Weeks in Treatment: 0 History of Present Illness HPI Description: 07/08/17 on evaluation today patient presents initially for issues that he has been having with bilateral lower extremities this has been going on for the best I can tell "about a month" he has been seen by his primary care provider at [facility] family practice and they have been performing wraps. They also did get him set up with home health although there was confusion about what company was providing the home health for him. Unfortunately patient with a poor historian and it was difficult to gather information about how this has really progressed over the past month. Nonetheless he does have erythema noted of the right lower extremity and ulceration at this  location as well. He has what appears to be lymphedema noted of the bilateral lower extremities and the left forefoot region is macerated potentially with a fungal infection noted as well. No fevers, chills, nausea, or vomiting noted at this time. As best I can gather patient has a history of hypothyroidism, hypertension, medicinal and antinausea medication he tells me due to the fact that he has been on the antibiotic. Electronic Signature(s) Signed: 07/08/2017 5:13:37 PM By: Worthy Keeler PA-C Entered By: Worthy Keeler on 07/08/2017 16:45:40 [name], [name] ([id number]) -------------------------------------------------------------------------------- Physical Exam Details Patient Name: [name], [name] Date of Service: 07/08/2017 8:00 AM Medical Record Number: [id number] Patient Account Number: [id number] Date of Birth/Sex: December 21, 1940 (76 y.o. Male) Treating RN: Cornell Barman Primary Care Provider: Park Liter Other Clinician: Referring Provider: Gladstone Lighter Treating Provider/Extender: Melburn Hake, Khyrin Trevathan Weeks in Treatment: 0 Constitutional patient is hypotensive.. pulse regular and within target range for patient.Marland Kitchen respirations regular, non-labored and within target range for patient.Marland Kitchen temperature within target range for patient.. Chronically ill appearing but in no apparent acute distress. Eyes conjunctiva clear no eyelid edema noted. pupils equal round and reactive to light and accommodation. Ears, Nose, Mouth, and Throat no gross abnormality of ear auricles or external auditory canals. normal hearing noted during conversation. mucus membranes moist. Respiratory normal breathing without difficulty. clear to auscultation bilaterally. Cardiovascular regular rate and rhythm with normal S1, S2. 1+ dorsalis pedis / and absent posterior tibialis pulses bilaterally. 1+ pitting edema of the bilateral lower extremities. Gastrointestinal (GI) soft, non-tender, non-distended, +BS. no  ventral hernia noted. Musculoskeletal normal gait and posture. Integumentary (Hair, Skin) Patient's left foot wound appears to potentially have a fungal infection and is secondly macerated on evaluation today.Marland Kitchen Psychiatric this patient is able to make decisions and demonstrates good insight into disease process. Alert and Oriented x 3. pleasant and cooperative. Electronic Signature(s) Signed: 07/08/2017 5:13:37  PM By: Worthy Keeler PA-C Entered By: Worthy Keeler on 07/08/2017 16:53:18 [name], [name] ([id number]) -------------------------------------------------------------------------------- Physician Orders Details Patient Name: [name] Date of Service: 07/08/2017 8:00 AM Medical Record Number: [id number] Patient Account Number: [id number] Date of Birth/Sex: [birth year] (76 y.o. Male) Treating RN: Cornell Barman Primary Care Provider: Park Liter Other Clinician: Referring Provider: Gladstone Lighter Treating Provider/Extender: Melburn Hake, Dorice Stiggers Weeks in Treatment: 0 Verbal / Phone Orders: Yes Clinician: Cornell Barman Read Back and Verified: Yes Diagnosis Coding Wound Cleansing Wound #1 Left,Distal,Dorsal Foot o Clean wound with Normal Saline. o Cleanse wound with mild soap and water Wound #2 Right,Medial Lower Leg o Clean wound with Normal Saline. o Cleanse wound with mild soap and water Anesthetic Wound #1 Left,Distal,Dorsal Foot o Topical Lidocaine 4% cream applied to wound bed prior to debridement - for clinic use only Wound #2 Right,Medial Lower Leg o Topical Lidocaine 4% cream applied to wound bed prior to debridement - for clinic use only Skin Barriers/Peri-Wound Care Wound #1 Left,Distal,Dorsal Foot o Antifungal powder-Nystatin Primary Wound Dressing Wound #2 Right,Medial Lower Leg o Aquacel Ag o Other: - Nystatin Powder Secondary Dressing Wound #2 Right,Medial Lower Leg o ABD pad o Dry Gauze o Conform/Kerlix Dressing Change  Frequency Wound #1 Left,Distal,Dorsal Foot o Change Dressing Monday, Wednesday, Friday - Pt needs to be seen by [facility] Monday, Wednesday, and Friday next week 07/11/17 - 07/15/17 then the pt will then need to be seen by [facility] Monday, Wednesday the next week and then pt will be seen on our office 07/22/17. ELDO, UMANZOR ([id number]) Wound #2 Right,Medial Lower Leg o Change Dressing Monday, Wednesday, Friday - Pt needs to be seen by [facility] Monday, Wednesday, and Friday next week 07/11/17 - 07/15/17 then the pt will then need to be seen by [facility] Monday, Wednesday the next week and then pt will be seen on our office 07/22/17. Follow-up Appointments Wound #1 Left,Distal,Dorsal Foot o Return Appointment in 2 weeks. Wound #2 Right,Medial Lower Leg o Return Appointment in 2 weeks. Edema Control Wound #2 Right,Medial Lower Leg o Unna Boot to Right Lower Extremity Additional Orders / Instructions Wound #1 Left,Distal,Dorsal Foot o Increase protein intake. Wound #2 Right,Medial Lower Leg o Increase protein intake. Home Health Wound #1 [place] Visits - [place] Nurse may visit PRN to address patientos wound care needs. o FACE TO FACE ENCOUNTER: MEDICARE and MEDICAID PATIENTS: I certify that this patient is under my care and that I had a face-to-face encounter that meets the physician face-to-face encounter requirements with this patient on this date. The encounter with the patient was in whole or in part for the following MEDICAL CONDITION: (primary reason for [place]) MEDICAL NECESSITY: I certify, that based on my findings, NURSING services are a medically necessary home health service. HOME BOUND STATUS: I certify that my clinical findings support that this patient is homebound (i.e., Due to illness or injury, pt requires aid of supportive devices such as crutches, cane, wheelchairs, walkers, the use of  special transportation or the assistance of another person to leave their place of residence. There is a normal inability to leave the home and doing so requires considerable and taxing effort. Other absences are for medical reasons / religious services and are infrequent or of short duration when for other reasons). o If current dressing causes regression in wound condition, may D/C ordered dressing product/s and apply Normal Saline Moist Dressing daily until next [facility] / Other MD appointment. [place] of regression in wound condition at [phone]. o Please direct any NON-WOUND related issues/requests for orders to patient's Primary Care Physician Wound #2 [place] Visits - JIN, CAPOTE ([id number]) o [place] Nurse may visit PRN to address patientos wound care needs. o FACE TO FACE ENCOUNTER: MEDICARE and MEDICAID PATIENTS: I certify that this patient is under my care and that I had a face-to-face encounter that meets the physician face-to-face encounter requirements with this patient on this date. The encounter with the patient was in whole or in part for the following MEDICAL CONDITION: (primary reason for [place]) MEDICAL NECESSITY: I certify, that based on my findings, NURSING services are a medically necessary home health service. HOME BOUND STATUS: I certify that my clinical findings support that this patient is homebound (i.e., Due to illness or injury, pt requires aid of supportive devices such as crutches, cane, wheelchairs, walkers, the use of special transportation or the assistance of another person to leave their place of residence. There is a normal inability to leave the home and doing so requires considerable and taxing effort. Other absences are for medical reasons / religious services and are infrequent or of short duration when for other reasons). o If current  dressing causes regression in wound condition, may D/C ordered dressing product/s and apply Normal Saline Moist Dressing daily until next [facility] / Other MD appointment. [place] of regression in wound condition at [phone]. o Please direct any NON-WOUND related issues/requests for orders to patient's Primary Care Physician Laboratory o Bacteria identified in Wound by Culture (MICRO) oooo LOINC Code: 1950-9 TOIZ Convenience Name: Wound culture routine Services and Therapies o Arterial Studies- Bilateral Patient Medications Allergies: No Known Drug Allergies Notifications Medication Indication Start End nystatin 07/08/2017 DOSE topical 100,000 unit/gram powder - Powder to be applied to the left forefoot with dressing changes which are performed Monday, Wednesday, and Friday Electronic Signature(s) Signed: 07/13/2017 4:42:06 PM By: Gretta Cool, BSN, RN, CWS, Kim RN, BSN Signed: 07/15/2017 9:17:40 AM By: Worthy Keeler PA-C Previous Signature: 07/08/2017 4:29:39 PM Version By: Gretta Cool BSN, RN, CWS, Kim RN, BSN Previous Signature: 07/08/2017 5:13:37 PM Version By: Worthy Keeler PA-C Previous Signature: 07/08/2017 10:12:03 AM Version By: Worthy Keeler PA-C Entered By: Gretta Cool, BSN, RN, CWS, Kim on 07/13/2017 08:45:09 [name], [name] ([id number]) -------------------------------------------------------------------------------- Problem List Details Patient Name: Carnell, Yuepheng C. Date of Service: 07/08/2017 8:00 AM Medical Record Number: [id number] Patient Account Number: [id number] Date of Birth/Sex: Feb 20, 1941 (76 y.o. 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 Date of Service: 07/08/2017 8:00 AM Medical Record Number: [id number] Patient Account Number: [id number] Date of Birth/Sex: Aug 08, 1941 (76 y.o. Male) Treating RN: Cornell Barman Primary Care Provider: Park Liter Other Clinician:  Referring Provider: Gladstone Lighter Treating Provider/Extender: Melburn Hake, Adrionna Delcid Weeks in Treatment: 0 Subjective Chief Complaint Information obtained from Patient Bilateral LE Lymphedema with left Lower Extermity Ulcer and right Forefoot Maceration History of Present Illness (HPI) 07/08/17 on evaluation today patient presents initially for issues that he has been having with bilateral lower extremities this has been going on for the best I can tell "about a month" he has been seen by his primary care provider at [facility] family practice and they have been performing wraps. They also did get him set up with home health although there was confusion about what company was providing the home health for him. Unfortunately patient with a poor historian and it was difficult to gather information about how this has really progressed over the past month. Nonetheless he does have erythema noted of the right lower extremity and ulceration at this location as well. He has what appears to be lymphedema noted of the bilateral lower extremities and the left forefoot  region is macerated potentially with a fungal infection noted as well. No fevers, chills, nausea, or vomiting noted at this time. As best I can gather patient has a history of hypothyroidism, hypertension, medicinal and antinausea medication he tells me due to the fact that he has been on the antibiotic. Wound History Patient presents with 2 open wounds that have been present for approximately 1 month. Patient has been treating wounds in the following manner: wraps. Laboratory tests have not been performed in the last month. Patient reportedly has not tested positive for an antibiotic resistant organism. Patient reportedly has not tested positive for osteomyelitis. Patient reportedly has not had testing performed to evaluate circulation in the legs. Patient experiences the following problems associated with their wounds: infection, swelling. Patient History Unable to Obtain Patient History due to Altered Mental Status. Information obtained from Patient, Caregiver, Chart. Allergies No Known Drug Allergies Social History Former smoker, Marital Status - Married, Alcohol Use - Moderate, Drug Use - No History, Caffeine Use - Rarely. [name], [name] ([id number]) Medical History Endocrine Patient has history of Type II Diabetes Patient is treated with Oral Agents. Blood sugar is not tested. Review of Systems (ROS) Constitutional Symptoms (General Health) The patient has no complaints or symptoms. Eyes The patient has no complaints or symptoms. Ear/Nose/Mouth/Throat The patient has no complaints or symptoms. General Notes: Patient answers "I don't know to every medical question I ask". Objective Constitutional patient is hypotensive.. pulse regular and within target range for patient.Marland Kitchen respirations regular, non-labored and within target range for patient.Marland Kitchen temperature within target range for patient.. Chronically ill appearing but in no apparent acute distress. Vitals Time Taken: 8:52 AM,  Temperature: 97.7 F, Pulse: 69 bpm, Respiratory Rate: 16 breaths/min, Blood Pressure: 118/39 mmHg. Eyes conjunctiva clear no eyelid edema noted. pupils equal round and reactive to light and accommodation. Ears, Nose, Mouth, and Throat no gross abnormality of ear auricles or external auditory canals. normal hearing noted during conversation. mucus membranes moist. Respiratory normal breathing without difficulty. clear to auscultation bilaterally. Cardiovascular regular rate and rhythm with normal S1, S2. 1+ dorsalis pedis / and absent posterior tibialis pulses bilaterally. 1+ pitting edema of the bilateral lower extremities. Gastrointestinal (GI) Behrend, Nicandro C. ([id number]) soft, non-tender, non-distended, +BS. no ventral hernia noted. Musculoskeletal normal gait and posture. Psychiatric this patient is able to make decisions and demonstrates good insight into disease process. Alert and Oriented x 3. pleasant and cooperative. Integumentary (Hair, Skin) Patient's left foot wound appears to potentially have a  fungal infection and is secondly macerated on evaluation today.. Wound #1 status is Open. Original cause of wound was Gradually Appeared. The wound is located on the Left,Distal,Dorsal Foot. The wound measures 7cm length x 9cm width x 0.1cm depth; 49.48cm^2 area and 4.948cm^3 volume. Wound #2 status is Open. Original cause of wound was Gradually Appeared. The wound is located on the Right,Medial Lower Leg. The wound measures 6cm length x 2.5cm width x 0.1cm depth; 11.781cm^2 area and 1.178cm^3 volume. There is no tunneling or undermining noted. There is a large amount of serous drainage noted. The wound margin is flat and intact. There is small (1-33%) red, pink granulation within the wound bed. There is a large (67-100%) amount of necrotic tissue within the wound bed including Adherent Slough. The periwound skin appearance exhibited: Maceration, Erythema. The periwound skin  appearance did not exhibit: Callus, Crepitus, Excoriation, Induration, Rash, Scarring, Dry/Scaly, Atrophie Blanche, Cyanosis, Ecchymosis, Hemosiderin Staining, Mottled, Pallor, Rubor. The surrounding wound skin color is noted with erythema which is circumferential. Other Condition(s) Patient presents with Lymphedema located on the Left Leg. The skin appearance exhibited: Dry/Scaly, Rubor. 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 ([id number]) Procedures Wound #2 Pre-procedure diagnosis of Wound #2 is a Diabetic Wound/Ulcer of the Lower Extremity located on the Right,Medial Lower Leg . There was a Haematologist Compression Therapy Procedure with a pre-treatment ABI of 1.1 by Cornell Barman, RN. Post procedure Diagnosis Wound #2: Same as Pre-Procedure Plan Wound Cleansing: Wound #1 Left,Distal,Dorsal Foot: Clean wound with Normal Saline. Cleanse wound with mild soap and water Wound #2 Right,Medial Lower Leg: Clean wound with Normal Saline. Cleanse wound with mild soap and water Anesthetic: Wound #1 Left,Distal,Dorsal Foot: Topical Lidocaine 4% cream applied to wound bed prior to debridement - for clinic use only Wound #2 Right,Medial Lower Leg: Topical Lidocaine 4% cream applied to wound bed prior to debridement - for clinic use only Skin Barriers/Peri-Wound Care: Wound #1 Left,Distal,Dorsal Foot: Antifungal powder-Nystatin Primary Wound Dressing: Wound #2 Right,Medial Lower Leg: Aquacel Ag Other: - Nystatin Powder Secondary Dressing: Wound #2 Right,Medial Lower Leg: ABD pad Dry Gauze Conform/Kerlix Dressing Change Frequency: Wound #1 Left,Distal,Dorsal Foot: Change  Dressing Monday, Wednesday, Friday - Pt needs to be seen by [facility] Monday, Wednesday, and Friday next week 07/11/17 - 07/15/17 then the pt will then need to be seen by [facility] Monday, Wednesday the next week and then pt will be seen on our office 07/22/17. Wound #2 Right,Medial Lower Leg: Change Dressing Monday, Wednesday, Friday - Pt needs to be seen by [facility] Monday, Wednesday, and Friday next week 07/11/17 - 07/15/17 then the pt will then need to be seen by [facility] Monday, Wednesday the [place], [place]. ([id number]) next week and then pt will be seen on our office 07/22/17. Follow-up Appointments: Wound #1 Left,Distal,Dorsal Foot: Return Appointment in 2 weeks. Wound #2 Right,Medial Lower Leg: Return Appointment in 2 weeks. Edema Control: Wound #2 Right,Medial Lower Leg: Unna Boot to Right Lower Extremity Additional Orders / Instructions: Wound #1 Left,Distal,Dorsal Foot: Increase protein intake. Wound #2 Right,Medial Lower Leg: Increase protein intake. Home Health: Wound #1 Left,Distal,Dorsal Foot: Continue Home Health Visits - [facility] Nurse may visit PRN to address patient s wound care needs.  FACE TO FACE ENCOUNTER: MEDICARE and MEDICAID PATIENTS: I certify that this patient is under my care and that I had a face-to-face encounter that meets the physician face-to-face encounter requirements with this patient on this date. The encounter with the patient was in whole or in part for the following MEDICAL CONDITION: (primary reason for [place]) MEDICAL NECESSITY: I certify, that based on my findings, NURSING services are a medically necessary home health service. HOME BOUND STATUS: I certify that my clinical findings support that this patient is homebound (i.e., Due to illness or injury, pt requires aid of supportive devices such as crutches, cane, wheelchairs, walkers, the use of special transportation or the assistance of another person to leave their place of residence. There  is a normal inability to leave the home and doing so requires considerable and taxing effort. Other absences are for medical reasons / religious services and are infrequent or of short duration when for other reasons). If current dressing causes regression in wound condition, may D/C ordered dressing product/s and apply Normal Saline Moist Dressing daily until next [place] / Other MD appointment. [place] of regression in wound condition at [phone]. Please direct any NON-WOUND related issues/requests for orders to patient's Primary Care Physician Wound #2 Right,Medial Lower Leg: [place] Visits - [facility] Nurse may visit PRN to address patient s wound care needs. FACE TO FACE ENCOUNTER: MEDICARE and MEDICAID PATIENTS: I certify that this patient is under my care and that I had a face-to-face encounter that meets the physician face-to-face encounter requirements with this patient on this date. The encounter with the patient was in whole or in part for the following MEDICAL CONDITION: (primary reason for [place]) MEDICAL NECESSITY: I certify, that based on my findings, NURSING services are a medically necessary home health service. HOME BOUND STATUS: I certify that my clinical findings support that this patient is homebound (i.e., Due to illness or injury, pt requires aid of supportive devices such as crutches, cane, wheelchairs, walkers, the use of special transportation or the assistance of another person to leave their place of residence. There is a normal inability to leave the home and doing so requires considerable and taxing effort. Other absences are for medical reasons / religious services and are infrequent or of short duration when for other reasons). If current dressing causes regression in wound condition, may D/C ordered dressing product/s and apply Normal Saline Moist Dressing daily until next [place] /  Other MD appointment. [place] of regression in wound condition at [phone]. Please direct any NON-WOUND related issues/requests for orders to patient's Primary Care Physician Services and Therapies ordered were: Arterial Studies- Bilateral [name], [name] ([id number]) The following medication(s) was prescribed: nystatin topical 100,000 unit/gram powder Powder to be applied to the left forefoot with dressing changes which are performed Monday, Wednesday, and Friday starting 07/08/2017 I am going to recommend that we initiate the above wound care orders for the next week. We will see him for reevaluation following in two weeks time to see were things stand at that point. In the interim I'm also going to recommend that we proceed with a bilateral arterial study to further evaluate his blood flow. If anything worsens in the interim patient will contact our office for additional recommendations. Please see above for specific wound care orders. Electronic Signature(s) Signed: 07/08/2017 5:13:37 PM By: Worthy Keeler PA-C Entered By: Worthy Keeler on 07/08/2017 16:54:20 Bredeson,  [name] ([id number]) -------------------------------------------------------------------------------- ROS/PFSH Details Patient Name: [name], [name] Date of Service: 07/08/2017 8:00 AM Medical Record Number: [id number] Patient Account Number: [id number] Date of Birth/Sex: Sep 06, 1941 (76 y.o. Male) Treating RN: Cornell Barman Primary Care Provider: Park Liter Other Clinician: Referring Provider: Gladstone Lighter Treating Provider/Extender: Melburn Hake, Pinki Rottman Weeks in Treatment: 0 Unable to Obtain Patient History due to oo Altered Mental Status Information Obtained From Patient Caregiver Chart Wound History Do you currently have one or more open woundso Yes How many open wounds do you currently haveo 2 Approximately how long have you had your woundso 1 month How have you been treating your  wound(s) until nowo wraps Has your wound(s) ever healed and then re-openedo No Have you had any lab work done in the past montho No Have you tested positive for an antibiotic resistant organism (MRSA, VRE)o No Have you tested positive for osteomyelitis (bone infection)o No Have you had any tests for circulation on your legso No Have you had other problems associated with your woundso Infection, Swelling Constitutional Symptoms (General Health) Complaints and Symptoms: No Complaints or Symptoms Eyes Complaints and Symptoms: No Complaints or Symptoms Ear/Nose/Mouth/Throat Complaints and Symptoms: No Complaints or Symptoms Endocrine Medical History: Positive for: Type II Diabetes Treated with: Oral agents Blood sugar tested every day: No Immunizations Pneumococcal Vaccine: MALAKHAI, BEITLER ([id number]) Received Pneumococcal Vaccination: No Implantable Devices Family and Social History Former smoker; Marital Status - Married; Alcohol Use: Moderate; Drug Use: No History; Caffeine Use: Rarely; Advanced Directives: No; Patient does not want information on Advanced Directives; Do not resuscitate: No; Living Will: No; Medical Power of Attorney: No Notes Patient answers "I don't know to every medical question I ask". Electronic Signature(s) Signed: 07/08/2017 4:29:39 PM By: Gretta Cool, BSN, RN, CWS, Kim RN, BSN Signed: 07/08/2017 5:13:37 PM By: Worthy Keeler PA-C Entered By: Gretta Cool BSN, RN, CWS, Kim on 07/08/2017 09:24:34 Swartzendruber, [name] ([id number]) -------------------------------------------------------------------------------- SuperBill Details Patient Name: Janine Ores C. Date of Service: 07/08/2017 Medical Record Number: [id number] Patient Account Number: [id number] Date of Birth/Sex: 08/14/1941 (76 y.o. 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 I10 Essential (primary) hypertension E03.9 Hypothyroidism, unspecified Facility Procedures CPT4 Code: 69485462 Description: [facility] VISIT-LEV 3 EST PT Modifier: Quantity: 1 CPT4 Code: [id number] Description: (Facility Use Only) 81829HB - APPLY UNNA BOOT RT Modifier: Quantity: 1 Physician Procedures CPT4: Description Modifier Quantity Code 7169678 [id number] - WC PHYS LEVEL 4 - NEW PT 1 ICD-10 Description Diagnosis I89.0 Lymphedema, not elsewhere classified 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

## 2017-07-10 NOTE — Progress Notes (Signed)
OHM, DENTLER ([id number]) Visit Report for 07/08/2017 Abuse/Suicide Risk Screen Details Patient Name: [name], KRIST. Date of Service: 07/08/2017 8:00 AM Medical Record Number: [id number] Patient Account Number: [id number] Date of Birth/Sex: [birth year] (76 y.o. Male) Treating RN: Cornell Barman Primary Care Wavie Hashimi: Park Liter Other Clinician: Referring Llana Deshazo: Gladstone Lighter Treating Demerius Podolak/Extender: Melburn Hake, HOYT Weeks in Treatment: 0 Abuse/Suicide Risk Screen Items Answer ABUSE/SUICIDE RISK SCREEN: Has anyone close to you tried to hurt or harm you recentlyo No Do you feel uncomfortable with anyone in your familyo No Has anyone forced you do things that you didnot want to doo No Do you have any thoughts of harming yourselfo No Patient displays signs or symptoms of abuse and/or neglect. No Electronic Signature(s) Signed: 07/08/2017 4:29:39 PM By: Gretta Cool, BSN, RN, CWS, Kim RN, BSN Entered By: Gretta Cool, BSN, RN, CWS, Kim on 07/08/2017 09:07:09 Markos, [name] ([id number]) -------------------------------------------------------------------------------- Activities of Daily Living Details Patient Name: [name], [name] Date of Service: 07/08/2017 8:00 AM Medical Record Number: [id number] Patient Account Number: [id number] Date of Birth/Sex: Dec 18, 1940 (76 y.o. Male) Treating RN: Cornell Barman Primary Care Lilyanah Celestin: Park Liter Other Clinician: Referring Laurieanne Galloway: Gladstone Lighter Treating Carneshia Raker/Extender: Melburn Hake, HOYT Weeks in Treatment: 0 Activities of Daily Living Items Answer Activities of Daily Living (Please select one for each item) Drive Automobile Completely Able Take Medications Completely Able Use Telephone Completely Able Care for Appearance Completely Able Use Toilet Completely Able Bath / Shower Completely Able Dress Self Completely Able Feed Self Completely Able Walk Completely Able Get In / Out Bed Completely Able Housework Completely Able Prepare  Meals Completely Able Handle Money Completely Able Shop for Self Completely Able Electronic Signature(s) Signed: 07/08/2017 4:29:39 PM By: Gretta Cool, BSN, RN, CWS, Kim RN, BSN Entered By: Gretta Cool, BSN, RN, CWS, Kim on 07/08/2017 09:07:36 Hayner, [name] ([id number]) -------------------------------------------------------------------------------- Education Assessment Details Patient Name: [name], [name]. Date of Service: 07/08/2017 8:00 AM Medical Record Number: [id number] Patient Account Number: [id number] Date of Birth/Sex: [birth year] (76 y.o. Male) Treating RN: Cornell Barman Primary Care Shandria Clinch: Park Liter Other Clinician: Referring Damen Windsor: Gladstone Lighter Treating Kati Riggenbach/Extender: Melburn Hake, HOYT Weeks in Treatment: 0 Primary Learner Assessed: Patient Learning Preferences/Education Level/Primary Language Learning Preference: Explanation, Demonstration Preferred Language: English Cognitive Barrier Assessment/Beliefs Language Barrier: No Translator Needed: No Memory Deficit: Yes Emotional Barrier: No Cultural/Religious Beliefs Affecting Medical No Care: Physical Barrier Assessment Impaired Vision: No Impaired Hearing: No Decreased Hand dexterity: No Knowledge/Comprehension Assessment Knowledge Level: Low Comprehension Level: Low Ability to understand written Low instructions: Motivation Assessment Anxiety Level: Calm Cooperation: Cooperative Education Importance: Acknowledges Need Interest in Health Problems: Uninterested Perception: Confused Willingness to Engage in Self- Low Management Activities: Readiness to Engage in Self- Low Management Activities: Electronic Signature(s) Signed: 07/08/2017 4:29:39 PM By: Gretta Cool, BSN, RN, CWS, Kim RN, BSN Entered By: Gretta Cool, BSN, RN, CWS, Kim on 07/08/2017 09:08:14 AVARY, PITSENBARGER ([id number], DOME ([id number]) -------------------------------------------------------------------------------- Fall Risk  Assessment Details Patient Name: [name], [name] Date of Service: 07/08/2017 8:00 AM Medical Record Number: [id number] Patient Account Number: [id number] Date of Birth/Sex: 1941/03/15 (76 y.o. Male) Treating RN: Cornell Barman Primary Care Khristian Seals: Park Liter Other Clinician: Referring Sherrilyn Nairn: Gladstone Lighter Treating Tyion Boylen/Extender: Melburn Hake, HOYT Weeks in Treatment: 0 Fall Risk Assessment Items Have you had 2 or more falls in the last 12 monthso 0 Yes Have you had any fall that resulted in injury in the last 12 monthso 0 No FALL RISK ASSESSMENT: History of falling - immediate or within 3 months 25  Yes Secondary diagnosis 0 No Ambulatory aid None/bed rest/wheelchair/nurse 0 No Crutches/cane/walker 15 Yes Furniture 0 No IV Access/Saline Lock 0 No Gait/Training Normal/bed rest/immobile 0 No Weak 10 Yes Impaired 0 No Mental Status Oriented to own ability 0 No Electronic Signature(s) Signed: 07/08/2017 4:29:39 PM By: Gretta Cool, BSN, RN, CWS, Kim RN, BSN Entered By: Gretta Cool, BSN, RN, CWS, Kim on 07/08/2017 09:08:42 Feltman, [name] ([id number]) -------------------------------------------------------------------------------- Foot Assessment Details Patient Name: [name], [name] Date of Service: 07/08/2017 8:00 AM Medical Record Number: [id number] Patient Account Number: [id number] Date of Birth/Sex: August 21, 1941 (76 y.o. Male) Treating RN: Cornell Barman Primary Care Lerae Langham: Park Liter Other Clinician: Referring Lauris Keepers: Gladstone Lighter Treating Britania Shreeve/Extender: Melburn Hake, HOYT Weeks in Treatment: 0 Foot Assessment Items Site Locations + = Sensation present, - = Sensation absent, C = Callus, U = Ulcer R = Redness, W = Warmth, M = Maceration, PU = Pre-ulcerative lesion F = Fissure, S = Swelling, D = Dryness Assessment Right: Left: Other Deformity: No No Prior Foot Ulcer: No No Prior Amputation: No No Charcot Joint: No No Ambulatory Status: Ambulatory With  Help Assistance Device: Walker Gait: Administrator, arts) Signed: 07/08/2017 4:29:39 PM By: Gretta Cool, BSN, RN, CWS, Kim RN, BSN Entered By: Gretta Cool, BSN, RN, CWS, Kim on 07/08/2017 09:11:21 [name], [name] ([id number]) -------------------------------------------------------------------------------- Nutrition Risk Assessment Details Patient Name: [name], [name] Date of Service: 07/08/2017 8:00 AM Medical Record Number: [id number] Patient Account Number: [id number] Date of Birth/Sex: 02-20-1941 (76 y.o. Male) Treating RN: Cornell Barman Primary Care Braxtyn Bojarski: Park Liter Other Clinician: Referring Welles Walthall: Gladstone Lighter Treating Avalynn Bowe/Extender: Melburn Hake, HOYT Weeks in Treatment: 0 Height (in): Weight (lbs): Body Mass Index (BMI): Nutrition Risk Assessment Items NUTRITION RISK SCREEN: I have an illness or condition that made me change the kind and/or 0 No amount of food I eat I eat fewer than two meals per day 0 No I eat few fruits and vegetables, or milk products 0 No I have three or more drinks of beer, liquor or wine almost every day 2 Yes I have tooth or mouth problems that make it hard for me to eat 0 No I don't always have enough money to buy the food I need 4 Yes I eat alone most of the time 1 Yes I take three or more different prescribed or over-the-counter drugs a 0 No day Without wanting to, I have lost or gained 10 pounds in the last six 0 No months I am not always physically able to shop, cook and/or feed myself 0 No Nutrition Protocols Good Risk Protocol Provide education on elevated blood sugars Moderate Risk Protocol 0 and impact on wound healing, as applicable Electronic Signature(s) Signed: 07/08/2017 4:29:39 PM By: Gretta Cool, BSN, RN, CWS, Kim RN, BSN Entered By: Gretta Cool, BSN, RN, CWS, Kim on 07/08/2017 09:09:14

## 2017-07-11 DIAGNOSIS — E1142 Type 2 diabetes mellitus with diabetic polyneuropathy: Secondary | ICD-10-CM | POA: Diagnosis not present

## 2017-07-11 DIAGNOSIS — S81801D Unspecified open wound, right lower leg, subsequent encounter: Secondary | ICD-10-CM | POA: Diagnosis not present

## 2017-07-11 DIAGNOSIS — L03119 Cellulitis of unspecified part of limb: Secondary | ICD-10-CM | POA: Diagnosis not present

## 2017-07-11 DIAGNOSIS — L03115 Cellulitis of right lower limb: Secondary | ICD-10-CM | POA: Diagnosis not present

## 2017-07-11 DIAGNOSIS — E1122 Type 2 diabetes mellitus with diabetic chronic kidney disease: Secondary | ICD-10-CM | POA: Diagnosis not present

## 2017-07-11 DIAGNOSIS — N181 Chronic kidney disease, stage 1: Secondary | ICD-10-CM | POA: Diagnosis not present

## 2017-07-11 DIAGNOSIS — M6281 Muscle weakness (generalized): Secondary | ICD-10-CM | POA: Diagnosis not present

## 2017-07-11 DIAGNOSIS — I129 Hypertensive chronic kidney disease with stage 1 through stage 4 chronic kidney disease, or unspecified chronic kidney disease: Secondary | ICD-10-CM | POA: Diagnosis not present

## 2017-07-11 LAB — AEROBIC CULTURE  (SUPERFICIAL SPECIMEN)

## 2017-07-11 LAB — AEROBIC CULTURE W GRAM STAIN (SUPERFICIAL SPECIMEN)

## 2017-07-11 NOTE — Progress Notes (Signed)
CLETO, CLAGGETT ([id number]) Visit Report for 07/08/2017 Allergy List Details Patient Name: Ahn, SHAYDE GERVACIO. Date of Service: 07/08/2017 8:00 AM Medical Record Number: [id number] Patient Account Number: [id number] Date of Birth/Sex: 06/18/1941 (76 y.o. Male) Treating RN: Cornell Barman Primary Care Kobie Whidby: Park Liter Other Clinician: Referring Avaiyah Strubel: Gladstone Lighter Treating Saron Vanorman/Extender: Melburn Hake, HOYT Weeks in Treatment: 0 Allergies Active Allergies No Known Drug Allergies Allergy Notes Electronic Signature(s) Signed: 07/08/2017 4:29:39 PM By: Gretta Cool, BSN, RN, CWS, Kim RN, BSN Entered By: Gretta Cool, BSN, RN, CWS, Kim on 07/08/2017 09:04:30 Delucia, Donnie Coffin ([id number]) -------------------------------------------------------------------------------- Arrival Information Details Patient Name: [name]. Date of Service: 07/08/2017 8:00 AM Medical Record Number: [id number] Patient Account Number: [id number] Date of Birth/Sex: 10/31/1940 (76 y.o. Male) Treating RN: Cornell Barman Primary Care Mabeline Varas: Park Liter Other Clinician: Referring Padraic Marinos: Gladstone Lighter Treating June Rode/Extender: Melburn Hake, HOYT Weeks in Treatment: 0 Visit Information Patient Arrived: Walker Arrival Time: 08:50 Accompanied By: son, [name] Transfer Assistance: None Patient Identification Verified: Yes Secondary Verification Process Yes Completed: Patient Has Alerts: Yes Patient Alerts: Patient on Blood Thinner Aspirin 81 Mg Electronic Signature(s) Signed: 07/08/2017 4:29:39 PM By: Gretta Cool, BSN, RN, CWS, Kim RN, BSN Entered By: Gretta Cool, BSN, RN, CWS, Kim on 07/08/2017 08:51:33 Sieg, Donnie Coffin ([id number]) -------------------------------------------------------------------------------- Clinic Level of Care Assessment Details Patient Name: Eden, Donnie Coffin. Date of Service: 07/08/2017 8:00 AM Medical Record Number: [id number] Patient Account Number: [id number] Date of Birth/Sex: 09-18-1941 (76  y.o. Male) Treating RN: Cornell Barman Primary Care Alexander Mcauley: Park Liter Other Clinician: Referring Shaune Westfall: Gladstone Lighter Treating Isatou Agredano/Extender: Melburn Hake, HOYT Weeks in Treatment: 0 Clinic Level of Care Assessment Items TOOL 1 Quantity Score []  - Use when EandM and Procedure is performed on INITIAL visit 0 ASSESSMENTS - Nursing Assessment / Reassessment X - General Physical Exam (combine w/ comprehensive assessment (listed just 1 20 below) when performed on new pt. evals) X - Comprehensive Assessment (HX, ROS, Risk Assessments, Wounds Hx, etc.) 1 25 ASSESSMENTS - Wound and Skin Assessment / Reassessment []  - Dermatologic / Skin Assessment (not related to wound area) 0 ASSESSMENTS - Ostomy and/or Continence Assessment and Care []  - Incontinence Assessment and Management 0 []  - Ostomy Care Assessment and Management (repouching, etc.) 0 PROCESS - Coordination of Care X - Simple Patient / Family Education for ongoing care 1 15 []  - Complex (extensive) Patient / Family Education for ongoing care 0 X - Staff obtains Consents, Records, Test Results / Process Orders 1 10 []  - Staff telephones HHA, Nursing Homes / Clarify orders / etc 0 []  - Routine Transfer to another Facility (non-emergent condition) 0 []  - Routine Hospital Admission (non-emergent condition) 0 X - New Admissions / Biomedical engineer / Ordering NPWT, Apligraf, etc. 1 15 []  - Emergency Hospital Admission (emergent condition) 0 PROCESS - Special Needs []  - Pediatric / Minor Patient Management 0 []  - Isolation Patient Management 0 Ponder, Capers C. ([id number]) []  - Hearing / Language / Visual special needs 0 []  - Assessment of Community assistance (transportation, D/C planning, etc.) 0 []  - Additional assistance / Altered mentation 0 []  - Support Surface(s) Assessment (bed, cushion, seat, etc.) 0 INTERVENTIONS - Miscellaneous []  - External ear exam 0 []  - Patient Transfer (multiple staff / Civil Service fast streamer /  Similar devices) 0 []  - Simple Staple / Suture removal (25 or less) 0 []  - Complex Staple / Suture removal (26 or more) 0 []  - Hypo/Hyperglycemic Management (do not check if billed separately) 0 X - Ankle / Brachial Index (  ABI) - do not check if billed separately 1 15 Has the patient been seen at the hospital within the last three years: Yes Total Score: 100 Level Of Care: New/Established - Level 3 Electronic Signature(s) Signed: 07/08/2017 4:29:39 PM By: Gretta Cool, BSN, RN, CWS, Kim RN, BSN Entered By: Gretta Cool, BSN, RN, CWS, Kim on 07/08/2017 13:28:35 Weaver, Donnie Coffin ([id number]) -------------------------------------------------------------------------------- Compression Therapy Details Patient Name: Margraf, Burdell C. Date of Service: 07/08/2017 8:00 AM Medical Record Number: [id number] Patient Account Number: [id number] Date of Birth/Sex: Jul 15, 1941 (76 y.o. Male) Treating RN: Cornell Barman Primary Care Mouhamed Glassco: Park Liter Other Clinician: Referring Zevin Nevares: Gladstone Lighter Treating Sharlynn Seckinger/Extender: Melburn Hake, HOYT Weeks in Treatment: 0 Compression Therapy Performed for Wound Wound #2 Right,Medial Lower Leg Assessment: Performed By: Clinician Cornell Barman, RN Compression Type: Rolena Infante Pre Treatment ABI: 1.1 Post Procedure Diagnosis Same as Pre-procedure Electronic Signature(s) Signed: 07/08/2017 1:28:03 PM By: Gretta Cool, BSN, RN, CWS, Kim RN, BSN Entered By: Gretta Cool, BSN, RN, CWS, Kim on 07/08/2017 13:28:03 Carfagno, Donnie Coffin ([id number]) -------------------------------------------------------------------------------- Encounter Discharge Information Details Patient Name: [name]. Date of Service: 07/08/2017 8:00 AM Medical Record Number: [id number] Patient Account Number: [id number] Date of Birth/Sex: July 15, 1941 (76 y.o. Male) Treating RN: Cornell Barman Primary Care Tysheena Ginzburg: Park Liter Other Clinician: Referring Panagiota Perfetti: Gladstone Lighter Treating Onyinyechi Huante/Extender: Melburn Hake, HOYT Weeks in Treatment: 0 Encounter Discharge Information Items Discharge Pain Level: 0 Discharge Condition: Stable Ambulatory Status: Walker Discharge Destination: Home Transportation: Private Auto Accompanied By: son Schedule Follow-up Appointment: No Medication Reconciliation completed No and provided to Patient/Care Nitza Schmid: Provided on Clinical Summary of Care: 07/08/2017 Form Type Recipient Paper Patient [facility] Electronic Signature(s) Signed: 07/11/2017 9:42:14 AM By: Alric Quan Entered By: Alric Quan on 07/08/2017 10:28:29 Spoelstra, Donnie Coffin ([id number]) -------------------------------------------------------------------------------- Lower Extremity Assessment Details Patient Name: Bumbaugh, Velmer C. Date of Service: 07/08/2017 8:00 AM Medical Record Number: [id number] Patient Account Number: [id number] Date of Birth/Sex: 06/05/1941 (76 y.o. Male) Treating RN: Cornell Barman Primary Care Kiev Labrosse: Park Liter Other Clinician: Referring Betty Brooks: Gladstone Lighter Treating Dannae Kato/Extender: Melburn Hake, HOYT Weeks in Treatment: 0 Edema Assessment Assessed: [Left: No] [Right: No] E[Left: dema] [Right: :] Calf Left: Right: Point of Measurement: 32 cm From Medial Instep 40.7 cm 42.5 cm Ankle Left: Right: Point of Measurement: 10 cm From Medial Instep 24 cm 26.4 cm Vascular Assessment Pulses: Dorsalis Pedis Palpable: [Left:Yes] [Right:Yes] Doppler Audible: [Left:Yes] [Right:Yes] Posterior Tibial Extremity colors, hair growth, and conditions: Extremity Color: [Left:Red] [Right:Red] Hair Growth on Extremity: [Left:No] [Right:No] Temperature of Extremity: [Left:Warm] [Right:Warm] Capillary Refill: [Left:< 3 seconds] [Right:< 3 seconds] Blood Pressure: Brachial: [Left:118] [Right:118] Dorsalis Pedis: 70 [Left:Dorsalis Pedis: 425] Ankle: Posterior Tibial: [Left:Posterior Tibial: 0.59] [Right:1.06] Electronic Signature(s) Signed: 07/08/2017 4:29:39 PM By:  Gretta Cool, BSN, RN, CWS, Kim RN, BSN Entered By: Gretta Cool, BSN, RN, CWS, Kim on 07/08/2017 09:21:40 Akerson, Donnie Coffin ([id number]) -------------------------------------------------------------------------------- Multi Wound Chart Details Patient Name: [name]. Date of Service: 07/08/2017 8:00 AM Medical Record Number: [id number] Patient Account Number: [id number] Date of Birth/Sex: 11-Nov-1940 (76 y.o. Male) Treating RN: Cornell Barman Primary Care Deral Schellenberg: Park Liter Other Clinician: Referring Dayzha Pogosyan: Gladstone Lighter Treating Janaria Mccammon/Extender: Melburn Hake, HOYT Weeks in Treatment: 0 Vital Signs Height(in): Pulse(bpm): 69 Weight(lbs): Blood Pressure 118/39 (mmHg): Body Mass Index(BMI): Temperature(F): 97.7 Respiratory Rate 16 (breaths/min): Photos: [1:No Photos] [2:No Photos] [N/A:N/A] Wound Location: [1:Left, Distal, Dorsal Foot] [2:Right Lower Leg - Medial] [N/A:N/A] Wounding Event: [1:Gradually Appeared] [2:Gradually Appeared] [N/A:N/A] Primary Etiology: [1:Diabetic Wound/Ulcer of the Lower Extremity] [2:Diabetic Wound/Ulcer of the Lower Extremity] [N/A:N/A]  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] [2:Lymphedema] [N/A:N/A] Comorbid History: [1:N/A] [2:Type II Diabetes] [N/A:N/A] Date Acquired: [1:07/08/2017] [2:05/11/2017] [N/A:N/A] Weeks of Treatment: [1:0] [2:0] [N/A:N/A] Wound Status: [1:Open] [2:Open] [N/A:N/A] Measurements L x W x D 7x9x0.1 [2:6x2.5x0.1] [N/A:N/A] (cm) Area (cm) : [1:49.48] [2:11.781] [N/A:N/A] Volume (cm) : [1:4.948] [2:1.178] [N/A:N/A] Classification: [1:N/A] [2:Grade 2] [N/A:N/A] Exudate Amount: [1:N/A] [2:Large] [N/A:N/A] Exudate Type: [1:N/A] [2:Serous] [N/A:N/A] Exudate Color: [1:N/A] [2:amber] [N/A:N/A] Wound Margin: [1:N/A] [2:Flat and Intact] [N/A:N/A] Granulation Amount: [1:N/A] [2:Small (1-33%)] [N/A:N/A] Granulation Quality: [1:N/A] [2:Red, Pink] [N/A:N/A] Necrotic Amount: [1:N/A] [2:Large (67-100%)] [N/A:N/A] Epithelialization: [1:N/A]  [2:None] [N/A:N/A] Periwound Skin Texture: No Abnormalities Noted [1:No Abnormalities Noted] [2:Excoriation: No Induration: No Callus: No Crepitus: No Rash: No Scarring: No] [N/A:N/A N/A] Periwound Skin Maceration: Yes Moisture: Dry/Scaly: No Periwound Skin Color: No Abnormalities Noted Erythema: Yes N/A Atrophie Blanche: No Cyanosis: No Ecchymosis: No Hemosiderin Staining: No Mottled: No Pallor: No Rubor: No Erythema Location: N/A Circumferential N/A Tenderness on No No N/A Palpation: Wound Preparation: N/A Ulcer Cleansing: Wound N/A Cleanser Procedures Performed: N/A Compression Therapy N/A Treatment Notes Wound #1 (Left, Distal, Dorsal Foot) 1. Cleansed with: Clean wound with Normal Saline 3. Peri-wound Care: Antifungal powder 4. Dressing Applied: Aquacel Ag 5. Secondary Dressing Applied Dry Gauze Kerlix/Conform 7. Secured with Tape Wound #2 (Right, Medial Lower Leg) 1. Cleansed with: Clean wound with Normal Saline 2. Anesthetic Topical Lidocaine 4% cream to wound bed prior to debridement 4. Dressing Applied: Aquacel Ag 5. Secondary Dressing Applied ABD Pad 7. Secured with Tape Haematologist to Right Lower Extremity Electronic Signature(s) Signed: 07/08/2017 1:28:11 PM By: Gretta Cool, BSN, RN, CWS, Kim RN, BSN [address] [place] ([id number]) Entered By: Gretta Cool, BSN, RN, CWS, Kim on 07/08/2017 13:28:11 Tworek, Donnie Coffin ([id number]) -------------------------------------------------------------------------------- Multi-Disciplinary Care Plan Details Patient Name: QUINTEL, MCCALLA. Date of Service: 07/08/2017 8:00 AM Medical Record Number: [id number] Patient Account Number: [id number] Date of Birth/Sex: December 16, 1940 (76 y.o. Male) Treating RN: Cornell Barman Primary Care [place] Winslow: Park Liter Other Clinician: Referring Hazen Brumett: Gladstone Lighter Treating Froylan Hobby/Extender: Melburn Hake, HOYT Weeks in Treatment: 0 Active Inactive ` Abuse / Safety / Falls / Self Care  Management Nursing Diagnoses: History of Falls Potential for falls Goals: Patient will not experience any injury related to falls Date Initiated: 07/08/2017 Target Resolution Date: 07/15/2017 Goal Status: Active Patient will remain injury free related to falls Date Initiated: 07/08/2017 Target Resolution Date: 07/15/2017 Goal Status: Active Interventions: Assess fall risk on admission and as needed Treatment Activities: Patient referred to home care : 07/08/2017 Notes: ` Orientation to the Wound Care Program Nursing Diagnoses: Knowledge deficit related to the wound healing center program Goals: Patient/caregiver will verbalize understanding of the [place] Program Date Initiated: 07/08/2017 Target Resolution Date: 07/15/2017 Goal Status: Active Interventions: Provide education on orientation to the wound center Edick, Bralyn C. ([id number]) Notes: ` Soft Tissue Infection Nursing Diagnoses: Impaired tissue integrity Potential for infection: soft tissue Goals: Patient/caregiver will verbalize understanding of or measures to prevent infection and contamination in the home setting Date Initiated: 07/08/2017 Target Resolution Date: 07/15/2017 Goal Status: Active Patient's soft tissue infection will resolve Date Initiated: 07/08/2017 Target Resolution Date: 07/15/2017 Goal Status: Active Interventions: Assess signs and symptoms of infection every visit Treatment Activities: Culture and sensitivity : 07/08/2017 Notes: ` Venous Leg Ulcer Nursing Diagnoses: Potential for venous Insuffiency (use before diagnosis confirmed) Goals: Non-invasive venous studies are completed as ordered Date Initiated: 07/08/2017 Target Resolution Date: 07/15/2017 Goal Status: Active Patient will maintain optimal edema control Date Initiated: 07/08/2017 Target Resolution Date:  07/15/2017 Goal Status: Active Interventions: Assess peripheral edema status every visit. Provide education on  venous insufficiency Treatment Activities: Non-invasive vascular studies : 07/08/2017 MCLEAN, MOYA ([id number]) Venous Duplex Doppler : 07/09/2017 Notes: ` Wound/Skin Impairment Nursing Diagnoses: Impaired tissue integrity Goals: Patient/caregiver will verbalize understanding of skin care regimen Date Initiated: 07/08/2017 Target Resolution Date: 07/15/2017 Goal Status: Active Interventions: Assess ulceration(s) every visit Treatment Activities: Patient referred to home care : 07/08/2017 Topical wound management initiated : 07/08/2017 Notes: Electronic Signature(s) Signed: 07/08/2017 1:27:08 PM By: Gretta Cool, BSN, RN, CWS, Kim RN, BSN Entered By: Gretta Cool, BSN, RN, CWS, Kim on 07/08/2017 13:27:07 Hord, Donnie Coffin ([id number]) -------------------------------------------------------------------------------- Non-Wound Condition Assessment Details Patient Name: Laminack, Clarkson C. Date of Service: 07/08/2017 8:00 AM Medical Record Number: [id number] Patient Account Number: [id number] Date of Birth/Sex: 1941-01-01 (76 y.o. Male) Treating RN: Cornell Barman Primary Care Danita Proud: Park Liter Other Clinician: Referring Tae Vonada: Gladstone Lighter Treating Kia Stavros/Extender: STONE III, HOYT Weeks in Treatment: 0 Non-Wound Condition: Condition: Lymphedema Location: Leg Side: Left Periwound Skin Texture Texture Color No Abnormalities Noted: No No Abnormalities Noted: No Rubor: Yes Moisture No Abnormalities Noted: No Dry / Scaly: Yes Electronic Signature(s) Signed: 07/08/2017 9:29:20 AM By: Gretta Cool, BSN, RN, CWS, Kim RN, BSN Entered By: Gretta Cool, BSN, RN, CWS, Kim on 07/08/2017 09:29:19 Pegues, Donnie Coffin ([id number]) -------------------------------------------------------------------------------- Pain Assessment Details Patient Name: Galka, Reynald C. Date of Service: 07/08/2017 8:00 AM Medical Record Number: [id number] Patient Account Number: [id number] Date of Birth/Sex: 12-01-1940 (76 y.o.  Male) Treating RN: Cornell Barman Primary Care Jacobus Colvin: Park Liter Other Clinician: Referring Marti Acebo: Gladstone Lighter Treating Noam Franzen/Extender: Melburn Hake, HOYT Weeks in Treatment: 0 Active Problems Location of Pain Severity and Description of Pain Patient Has Paino No Site Locations With Dressing Change: No Duration of the Pain. Constant / Intermittento Intermittent Character of Pain Describe the Pain: Shooting, Tender, Throbbing Pain Management and Medication Current Pain Management: Goals for Pain Management Topical or injectable lidocaine is offered to patient for acute pain when surgical debridement is performed. If needed, Patient is instructed to use over the counter pain medication for the following 24-48 hours after debridement. Wound care MDs do not prescribed pain medications. Patient has chronic pain or uncontrolled pain. Patient has been instructed to make an appointment with their Primary Care Physician for pain management. Electronic Signature(s) Signed: 07/08/2017 4:29:39 PM By: Gretta Cool, BSN, RN, CWS, Kim RN, BSN Entered By: Gretta Cool, BSN, RN, CWS, Kim on 07/08/2017 08:52:18 [name] ([id number]) -------------------------------------------------------------------------------- Patient/Caregiver Education Details Patient Name: [name] Date of Service: 07/08/2017 8:00 AM Medical Record Number: [id number] Patient Account Number: [id number] Date of Birth/Gender: Oct 06, 1941 (76 y.o. Male) Treating RN: Ahmed Prima Primary Care Physician: Park Liter Other Clinician: Referring Physician: Gladstone Lighter Treating Physician/Extender: Sharalyn Ink in Treatment: 0 Education Assessment Education Provided To: Patient Education Topics Provided Wound/Skin Impairment: Handouts: Other: do not get dressings wet Methods: Demonstration, Explain/Verbal Responses: State content correctly Electronic Signature(s) Signed: 07/11/2017 9:42:14 AM  By: Alric Quan Entered By: Alric Quan on 07/08/2017 10:28:45 Oehler, Ruffin C. ([id number]) -------------------------------------------------------------------------------- Wound Assessment Details Patient Name: Bebee, Pratyush C. Date of Service: 07/08/2017 8:00 AM Medical Record Number: [id number] Patient Account Number: [id number] Date of Birth/Sex: 09/22/1941 (76 y.o. Male) Treating RN: Cornell Barman Primary Care Nyisha Clippard: Park Liter Other Clinician: Referring Randolf Sansoucie: Gladstone Lighter Treating Georgina Krist/Extender: Melburn Hake, HOYT Weeks in Treatment: 0 Wound Status Wound Number: 1 Primary Etiology: Diabetic Wound/Ulcer of the Lower Extremity Wound Location: Left, Distal, Dorsal Foot Secondary Fungal Wounding Event: Gradually  Appeared Etiology: Date Acquired: 07/08/2017 Wound Status: Open Weeks Of Treatment: 0 Clustered Wound: No Photos Photo Uploaded By: Gretta Cool, BSN, RN, CWS, Kim on 07/08/2017 14:24:47 Wound Measurements Length: (cm) 7 % Reduction Width: (cm) 9 % Reduction Depth: (cm) 0.1 Area: (cm) 49.48 Volume: (cm) 4.948 in Area: in Volume: Periwound Skin Texture Texture Color No Abnormalities Noted: No No Abnormalities Noted: No Moisture No Abnormalities Noted: No Treatment Notes Wound #1 (Left, Distal, Dorsal Foot) 1. Cleansed with: Clean wound with Normal Saline 3. Peri-wound Care: Antifungal powder 4. Dressing Applied: Aquacel Ag Olmeda, Jonatha C. ([id number]) 5. Secondary Dressing Applied Dry Gauze Kerlix/Conform 7. Secured with Recruitment consultant) Signed: 07/08/2017 4:29:39 PM By: Gretta Cool, BSN, RN, CWS, Kim RN, BSN Entered By: Gretta Cool, BSN, RN, CWS, Kim on 07/08/2017 09:25:49 Wittner, Donnie Coffin ([id number]) -------------------------------------------------------------------------------- Wound Assessment Details Patient Name: Gouger, Makarios C. Date of Service: 07/08/2017 8:00 AM Medical Record Number: [id number] Patient Account Number:  [id number] Date of Birth/Sex: May 06, 1941 (76 y.o. Male) Treating RN: Cornell Barman Primary Care Hayley Horn: Park Liter Other Clinician: Referring Lataria Courser: Gladstone Lighter Treating Terika Pillard/Extender: Melburn Hake, HOYT Weeks in Treatment: 0 Wound Status Wound Number: 2 Primary Etiology: Diabetic Wound/Ulcer of the Lower Extremity Wound Location: Right Lower Leg - Medial Secondary Lymphedema Wounding Event: Gradually Appeared Etiology: Date Acquired: 05/11/2017 Wound Status: Open Weeks Of Treatment: 0 Comorbid Type II Diabetes Clustered Wound: No History: Photos Wound Measurements Length: (cm) 6 Width: (cm) 2.5 Depth: (cm) 0.1 Area: (cm) 11.781 Volume: (cm) 1.178 % Reduction in Area: 0% % Reduction in Volume: 0% Epithelialization: None Tunneling: No Undermining: No Wound Description Classification: Grade 2 Foul Odor After Wound Margin: Flat and Intact Slough/Fibrino Exudate Amount: Large Exudate Type: Serous Exudate Color: amber Cleansing: No Yes Wound Bed Granulation Amount: Small (1-33%) Exposed Structure Granulation Quality: Red, Pink Fascia Exposed: No Necrotic Amount: Large (67-100%) Fat Layer (Subcutaneous Tissue) Exposed: No Necrotic Quality: Adherent Slough Tendon Exposed: No Muscle Exposed: No Joint Exposed: No Bone Exposed: No Keown, Alim C. ([id number]) Periwound Skin Texture Texture Color No Abnormalities Noted: No No Abnormalities Noted: No Callus: No Atrophie Blanche: No Crepitus: No Cyanosis: No Excoriation: No Ecchymosis: No Induration: No Erythema: Yes Rash: No Erythema Location: Circumferential Scarring: No Hemosiderin Staining: No Mottled: No Moisture Pallor: No No Abnormalities Noted: No Rubor: No Dry / Scaly: No Maceration: Yes Wound Preparation Ulcer Cleansing: Wound Cleanser Treatment Notes Wound #2 (Right, Medial Lower Leg) 1. Cleansed with: Clean wound with Normal Saline 2. Anesthetic Topical Lidocaine 4% cream  to wound bed prior to debridement 4. Dressing Applied: Aquacel Ag 5. Secondary Dressing Applied ABD Pad 7. Secured with Engineer, drilling to Right Lower Extremity Electronic Signature(s) Signed: 07/08/2017 2:27:20 PM By: Gretta Cool, BSN, RN, CWS, Kim RN, BSN Previous Signature: 07/08/2017 9:28:40 AM Version By: Gretta Cool, BSN, RN, CWS, Kim RN, BSN Entered By: Gretta Cool, BSN, RN, CWS, Kim on 07/08/2017 14:27:19 Ganaway, Donnie Coffin ([id number]) -------------------------------------------------------------------------------- Vitals Details Patient Name: Behrendt, Hazael C. Date of Service: 07/08/2017 8:00 AM Medical Record Number: [id number] Patient Account Number: [id number] Date of Birth/Sex: [birth year] (76 y.o. Male) Treating RN: Cornell Barman Primary Care Deajah Erkkila: Park Liter Other Clinician: Referring Saxton Chain: Gladstone Lighter Treating Clark Clowdus/Extender: Melburn Hake, HOYT Weeks in Treatment: 0 Vital Signs Time Taken: 08:52 Temperature (F): 97.7 Pulse (bpm): 69 Respiratory Rate (breaths/min): 16 Blood Pressure (mmHg): 118/39 Reference Range: 80 - 120 mg / dl Electronic Signature(s) Signed: 07/08/2017 4:29:39 PM By: Gretta Cool, BSN, RN, CWS, Kim RN, BSN Entered By: Gretta Cool, BSN, RN, CWS, Kim  on 07/08/2017 08:54:11

## 2017-07-12 DIAGNOSIS — E1122 Type 2 diabetes mellitus with diabetic chronic kidney disease: Secondary | ICD-10-CM | POA: Diagnosis not present

## 2017-07-12 DIAGNOSIS — N181 Chronic kidney disease, stage 1: Secondary | ICD-10-CM | POA: Diagnosis not present

## 2017-07-12 DIAGNOSIS — E1142 Type 2 diabetes mellitus with diabetic polyneuropathy: Secondary | ICD-10-CM | POA: Diagnosis not present

## 2017-07-12 DIAGNOSIS — L03115 Cellulitis of right lower limb: Secondary | ICD-10-CM | POA: Diagnosis not present

## 2017-07-12 DIAGNOSIS — I129 Hypertensive chronic kidney disease with stage 1 through stage 4 chronic kidney disease, or unspecified chronic kidney disease: Secondary | ICD-10-CM | POA: Diagnosis not present

## 2017-07-13 DIAGNOSIS — E1142 Type 2 diabetes mellitus with diabetic polyneuropathy: Secondary | ICD-10-CM | POA: Diagnosis not present

## 2017-07-13 DIAGNOSIS — L03115 Cellulitis of right lower limb: Secondary | ICD-10-CM | POA: Diagnosis not present

## 2017-07-13 DIAGNOSIS — I129 Hypertensive chronic kidney disease with stage 1 through stage 4 chronic kidney disease, or unspecified chronic kidney disease: Secondary | ICD-10-CM | POA: Diagnosis not present

## 2017-07-13 DIAGNOSIS — E1122 Type 2 diabetes mellitus with diabetic chronic kidney disease: Secondary | ICD-10-CM | POA: Diagnosis not present

## 2017-07-13 DIAGNOSIS — N181 Chronic kidney disease, stage 1: Secondary | ICD-10-CM | POA: Diagnosis not present

## 2017-07-14 ENCOUNTER — Telehealth: Payer: Self-pay | Admitting: Family Medicine

## 2017-07-14 DIAGNOSIS — L03119 Cellulitis of unspecified part of limb: Secondary | ICD-10-CM | POA: Diagnosis not present

## 2017-07-14 DIAGNOSIS — E1122 Type 2 diabetes mellitus with diabetic chronic kidney disease: Secondary | ICD-10-CM | POA: Diagnosis not present

## 2017-07-14 DIAGNOSIS — I129 Hypertensive chronic kidney disease with stage 1 through stage 4 chronic kidney disease, or unspecified chronic kidney disease: Secondary | ICD-10-CM | POA: Diagnosis not present

## 2017-07-14 DIAGNOSIS — E1142 Type 2 diabetes mellitus with diabetic polyneuropathy: Secondary | ICD-10-CM | POA: Diagnosis not present

## 2017-07-14 DIAGNOSIS — L03115 Cellulitis of right lower limb: Secondary | ICD-10-CM | POA: Diagnosis not present

## 2017-07-14 DIAGNOSIS — N181 Chronic kidney disease, stage 1: Secondary | ICD-10-CM | POA: Diagnosis not present

## 2017-07-14 NOTE — Telephone Encounter (Signed)
During visit late tuesday afternoon, Patient complained he has been having constipation.  Also getting low blood pressure readings : 100/50 is the usual reading. Patient denies feeling dizzy or light headed.  Please Advise.  Thank you

## 2017-07-15 ENCOUNTER — Ambulatory Visit (INDEPENDENT_AMBULATORY_CARE_PROVIDER_SITE_OTHER): Payer: Medicare Other

## 2017-07-15 ENCOUNTER — Ambulatory Visit: Payer: Medicare Other | Admitting: Family Medicine

## 2017-07-15 ENCOUNTER — Encounter: Payer: Self-pay | Admitting: Family Medicine

## 2017-07-15 VITALS — BP 105/52 | HR 73 | Temp 97.5°F | Resp 16 | Ht 65.0 in | Wt 165.2 lb

## 2017-07-15 VITALS — BP 105/62 | HR 73 | Temp 97.5°F | Resp 16 | Ht 65.0 in | Wt 165.1 lb

## 2017-07-15 DIAGNOSIS — Z0000 Encounter for general adult medical examination without abnormal findings: Secondary | ICD-10-CM

## 2017-07-15 DIAGNOSIS — Z599 Problem related to housing and economic circumstances, unspecified: Secondary | ICD-10-CM

## 2017-07-15 DIAGNOSIS — R460 Very low level of personal hygiene: Secondary | ICD-10-CM | POA: Diagnosis not present

## 2017-07-15 DIAGNOSIS — I129 Hypertensive chronic kidney disease with stage 1 through stage 4 chronic kidney disease, or unspecified chronic kidney disease: Secondary | ICD-10-CM | POA: Diagnosis not present

## 2017-07-15 DIAGNOSIS — Z598 Other problems related to housing and economic circumstances: Secondary | ICD-10-CM

## 2017-07-15 DIAGNOSIS — E1142 Type 2 diabetes mellitus with diabetic polyneuropathy: Secondary | ICD-10-CM | POA: Diagnosis not present

## 2017-07-15 DIAGNOSIS — S81801D Unspecified open wound, right lower leg, subsequent encounter: Secondary | ICD-10-CM

## 2017-07-15 DIAGNOSIS — Z741 Need for assistance with personal care: Secondary | ICD-10-CM

## 2017-07-15 DIAGNOSIS — L03115 Cellulitis of right lower limb: Secondary | ICD-10-CM | POA: Diagnosis not present

## 2017-07-15 DIAGNOSIS — E1122 Type 2 diabetes mellitus with diabetic chronic kidney disease: Secondary | ICD-10-CM | POA: Diagnosis not present

## 2017-07-15 DIAGNOSIS — N181 Chronic kidney disease, stage 1: Secondary | ICD-10-CM | POA: Diagnosis not present

## 2017-07-15 NOTE — Telephone Encounter (Signed)
Routing to provider  

## 2017-07-15 NOTE — Patient Instructions (Signed)
Mr. [name] , Thank you for taking time to come for your Medicare Wellness Visit. I appreciate your ongoing commitment to your health goals. Please review the following plan we discussed and let me know if I can assist you in the future.   Screening recommendations/referrals: Colonoscopy: completed 03/15/2005, no longer required Recommended yearly ophthalmology/optometry visit for glaucoma screening and checkup Recommended yearly dental visit for hygiene and checkup  Vaccinations: Influenza vaccine: due now- declined Pneumococcal vaccine: up to date  Tdap vaccine: up to date Shingles vaccine: due, check with your insurance company for coverage    Advanced directives: Advance directive discussed with you today. Even though you declined this today please call our office should you change your mind and we can give you the proper paperwork for you to fill out.  Conditions/risks identified:  Nutrition risk identified. Discussed eating 3 healthy meals a day and importance of healthy diet and wound healing.   Next appointment: Follow up in one year for your annual wellness exam.   Preventive Care 76 Years and Older, Male Preventive care refers to lifestyle choices and visits with your health care provider that can promote health and wellness. What does preventive care include?  A yearly physical exam. This is also called an annual well check.  Dental exams once or twice a year.  Routine eye exams. Ask your health care provider how often you should have your eyes checked.  Personal lifestyle choices, including:  Daily care of your teeth and gums.  Regular physical activity.  Eating a healthy diet.  Avoiding tobacco and drug use.  Limiting alcohol use.  Practicing safe sex.  Taking low doses of aspirin every day.  Taking vitamin and mineral supplements as recommended by your health care provider. What happens during an annual well check? The services and screenings done by your  health care provider during your annual well check will depend on your age, overall health, lifestyle risk factors, and family history of disease. Counseling  Your health care provider may ask you questions about your:  Alcohol use.  Tobacco use.  Drug use.  Emotional well-being.  Home and relationship well-being.  Sexual activity.  Eating habits.  History of falls.  Memory and ability to understand (cognition).  Work and work Statistician. Screening  You may have the following tests or measurements:  Height, weight, and BMI.  Blood pressure.  Lipid and cholesterol levels. These may be checked every 5 years, or more frequently if you are over 51 years old.  Skin check.  Lung cancer screening. You may have this screening every year starting at age 45 if you have a 30-pack-year history of smoking and currently smoke or have quit within the past 15 years.  Fecal occult blood test (FOBT) of the stool. You may have this test every year starting at age 89.  Flexible sigmoidoscopy or colonoscopy. You may have a sigmoidoscopy every 5 years or a colonoscopy every 10 years starting at age 14.  Prostate cancer screening. Recommendations will vary depending on your family history and other risks.  Hepatitis C blood test.  Hepatitis B blood test.  Sexually transmitted disease (STD) testing.  Diabetes screening. This is done by checking your blood sugar (glucose) after you have not eaten for a while (fasting). You may have this done every 1-3 years.  Abdominal aortic aneurysm (AAA) screening. You may need this if you are a current or former smoker.  Osteoporosis. You may be screened starting at age 60 if you  are at high risk. Talk with your health care provider about your test results, treatment options, and if necessary, the need for more tests. Vaccines  Your health care provider may recommend certain vaccines, such as:  Influenza vaccine. This is recommended every  year.  Tetanus, diphtheria, and acellular pertussis (Tdap, Td) vaccine. You may need a Td booster every 10 years.  Zoster vaccine. You may need this after age 76.  Pneumococcal 13-valent conjugate (PCV13) vaccine. One dose is recommended after age 56.  Pneumococcal polysaccharide (PPSV23) vaccine. One dose is recommended after age 36. Talk to your health care provider about which screenings and vaccines you need and how often you need them. This information is not intended to replace advice given to you by your health care provider. Make sure you discuss any questions you have with your health care provider. Document Released: 10/24/2015 Document Revised: 06/16/2016 Document Reviewed: 07/29/2015 Elsevier Interactive Patient Education  2017 [place] Prevention in the Home Falls can cause injuries. They can happen to people of all ages. There are many things you can do to make your home safe and to help prevent falls. What can I do on the outside of my home?  Regularly fix the edges of walkways and driveways and fix any cracks.  Remove anything that might make you trip as you walk through a door, such as a raised step or threshold.  Trim any bushes or trees on the path to your home.  Use bright outdoor lighting.  Clear any walking paths of anything that might make someone trip, such as rocks or tools.  Regularly check to see if handrails are loose or broken. Make sure that both sides of any steps have handrails.  Any raised decks and porches should have guardrails on the edges.  Have any leaves, snow, or ice cleared regularly.  Use sand or salt on walking paths during winter.  Clean up any spills in your garage right away. This includes oil or grease spills. What can I do in the bathroom?  Use night lights.  Install grab bars by the toilet and in the tub and shower. Do not use towel bars as grab bars.  Use non-skid mats or decals in the tub or shower.  If you  need to sit down in the shower, use a plastic, non-slip stool.  Keep the floor dry. Clean up any water that spills on the floor as soon as it happens.  Remove soap buildup in the tub or shower regularly.  Attach bath mats securely with double-sided non-slip rug tape.  Do not have throw rugs and other things on the floor that can make you trip. What can I do in the bedroom?  Use night lights.  Make sure that you have a light by your bed that is easy to reach.  Do not use any sheets or blankets that are too big for your bed. They should not hang down onto the floor.  Have a firm chair that has side arms. You can use this for support while you get dressed.  Do not have throw rugs and other things on the floor that can make you trip. What can I do in the kitchen?  Clean up any spills right away.  Avoid walking on wet floors.  Keep items that you use a lot in easy-to-reach places.  If you need to reach something above you, use a strong step stool that has a grab bar.  Keep electrical cords out  of the way.  Do not use floor polish or wax that makes floors slippery. If you must use wax, use non-skid floor wax.  Do not have throw rugs and other things on the floor that can make you trip. What can I do with my stairs?  Do not leave any items on the stairs.  Make sure that there are handrails on both sides of the stairs and use them. Fix handrails that are broken or loose. Make sure that handrails are as long as the stairways.  Check any carpeting to make sure that it is firmly attached to the stairs. Fix any carpet that is loose or worn.  Avoid having throw rugs at the top or bottom of the stairs. If you do have throw rugs, attach them to the floor with carpet tape.  Make sure that you have a light switch at the top of the stairs and the bottom of the stairs. If you do not have them, ask someone to add them for you. What else can I do to help prevent falls?  Wear shoes  that:  Do not have high heels.  Have rubber bottoms.  Are comfortable and fit you well.  Are closed at the toe. Do not wear sandals.  If you use a stepladder:  Make sure that it is fully opened. Do not climb a closed stepladder.  Make sure that both sides of the stepladder are locked into place.  Ask someone to hold it for you, if possible.  Clearly mark and make sure that you can see:  Any grab bars or handrails.  First and last steps.  Where the edge of each step is.  Use tools that help you move around (mobility aids) if they are needed. These include:  Canes.  Walkers.  Scooters.  Crutches.  Turn on the lights when you go into a dark area. Replace any light bulbs as soon as they burn out.  Set up your furniture so you have a clear path. Avoid moving your furniture around.  If any of your floors are uneven, fix them.  If there are any pets around you, be aware of where they are.  Review your medicines with your doctor. Some medicines can make you feel dizzy. This can increase your chance of falling. Ask your doctor what other things that you can do to help prevent falls. This information is not intended to replace advice given to you by your health care provider. Make sure you discuss any questions you have with your health care provider. Document Released: 07/24/2009 Document Revised: 03/04/2016 Document Reviewed: 11/01/2014 Elsevier Interactive Patient Education  2017 [employer].

## 2017-07-15 NOTE — Progress Notes (Signed)
Subjective:   [name] is a 76 y.o. male who presents for Medicare Annual/Subsequent preventive examination.  Review of Systems:   Cardiac Risk Factors include: male gender;hypertension;dyslipidemia;diabetes mellitus;advanced age (>75men, >86 women)     Objective:    Vitals: BP (!) 105/52 (BP Location: Left Arm, Patient Position: Sitting)   Pulse 73   Temp (!) 97.5 F (36.4 C) (Oral)   Resp 16   Ht 5\' 5"  (1.651 m)   Wt 165 lb 3.2 oz (74.9 kg)   BMI 27.49 kg/m   Body mass index is 27.49 kg/m.  Tobacco History  Smoking Status  . Former Smoker  . Packs/day: 0.25  . Types: Cigarettes  . Quit date: 03/22/2016  Smokeless Tobacco  . Current User  . Types: Chew     Ready to quit: Not Answered Counseling given: Not Answered   Past Medical History:  Diagnosis Date  . Diabetes mellitus   . 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 Hypertension   . Hypothyroidism   . Neuropathy   . Venous stasis    Past Surgical History:  Procedure Laterality Date  . CATARACT EXTRACTION W/PHACO Right 10/25/2016   Procedure: CATARACT EXTRACTION PHACO AND INTRAOCULAR LENS PLACEMENT (IOC)  right eye complicated;  Surgeon: Ronnell Freshwater, MD;  Location: [facility];  Service: Ophthalmology;  Laterality: Right;  Right eye Diabetic - oral meds Vision blue  . FINGER SURGERY     Family History  Problem Relation Age of Onset  . Heart disease Mother   . Alzheimer's disease Sister   . Heart disease Maternal Aunt    History  Sexual Activity  . Sexual activity: Not [name] file    Comment: currently trying to quit now    Outpatient Encounter Prescriptions as of 07/15/2017  Medication Sig  . aspirin EC 81 MG tablet Take 81 mg by mouth daily.  Marland Kitchen levothyroxine (SYNTHROID, LEVOTHROID) 75 MCG tablet Take 1 tablet (75 mcg total) by mouth daily before breakfast.  . lisinopril (PRINIVIL,ZESTRIL) 5 MG tablet Take 1 tablet (5 mg total) by mouth  daily.  . ondansetron (ZOFRAN) 8 MG tablet Take Before you take the antibiotic BID  . sulfamethoxazole-trimethoprim (BACTRIM DS,SEPTRA DS) 800-160 MG tablet Take 1 tablet by mouth 2 (two) times daily. (Patient not taking: Reported [name] 07/15/2017)   No facility-administered encounter medications [name] file as of 07/15/2017.     Activities of Daily Living In your present state of health, do you have any difficulty performing the following activities: 07/15/2017 06/20/2017  Hearing? N N  Vision? Y N  Difficulty concentrating or making decisions? N N  Walking or climbing stairs? Y Y  Comment walker -  Dressing or bathing? N Y  Comment sponge bath -  Doing errands, shopping? Y N  Comment son helps -  Conservation officer, nature and eating ? N -  Using the Toilet? N -  In the past six months, have you accidently leaked urine? Y -  Comment wears protection -  Do you have problems with loss of bowel control? N -  Managing your Medications? N -  Managing your Finances? N -  Comment - -  Housekeeping or managing your Housekeeping? Y -  Comment son manages -  Some recent data might be hidden    Patient Care Team: Valerie Roys, DO as PCP - General (Family Medicine)   Assessment:     Exercise Activities and Dietary recommendations  Current Exercise Habits: The patient does not participate in regular exercise at present, Exercise limited by: Other - see comments (wound care currently to bilateral legs)  Goals    . Have 3 meals a day      Fall Risk Fall Risk  01/28/2017 09/21/2016 04/14/2016  Falls in the past year? No No No   Depression Screen PHQ 2/9 Scores 01/28/2017 09/21/2016 04/14/2016  PHQ - 2 Score 0 0 0    Cognitive Function     6CIT Screen 07/15/2017 09/21/2016  What Year? 4 points 4 points  What month? 3 points 0 points  What time? 0 points 0 points  Count back from 20 2 points (No Data)  Months in reverse 4 points (No Data)  Repeat phrase 10 points 6 points  Total Score 23 -     Immunization History  Administered Date(s) Administered  . Pneumococcal Polysaccharide-23 01/15/2010  . Td 03/15/2005, 09/25/2015   Screening Tests Health Maintenance  Topic Date Due  . PNA vac Low Risk Adult (2 of 2 - PCV13) 07/01/2018 (Originally 01/16/2011)  . INFLUENZA VACCINE  07/11/2018 (Originally 05/11/2017)  . OPHTHALMOLOGY EXAM  11/24/2017  . HEMOGLOBIN A1C  12/18/2017  . FOOT EXAM  07/01/2018  . TETANUS/TDAP  09/24/2025      Plan:    I have personally reviewed and addressed the Medicare Annual Wellness questionnaire and have noted the following in the patient's chart:  A. Medical and social history B. Use of alcohol, tobacco or illicit drugs  C. Current medications and supplements D. Functional ability and status E.  Nutritional status F.  Physical activity G. Advance directives H. List of other physicians I.  Hospitalizations, surgeries, and ER visits in previous 12 months J.  [place] such as hearing and vision if needed, cognitive and depression L. Referrals and appointments   In addition, I have reviewed and discussed with patient certain preventive protocols, quality metrics, and best practice recommendations. A written personalized care plan for preventive services as well as general preventive health recommendations were provided to patient.   Signed,  Tyler Aas, LPN Nurse Health Advisor   MD Recommendations: none

## 2017-07-15 NOTE — Progress Notes (Signed)
BP 105/62   Pulse 73   Temp (!) 97.5 F (36.4 C)   Resp 16   Ht 5\' 5"  (1.651 m)   Wt 165 lb 2 oz (74.9 kg)   BMI 27.48 kg/m    Subjective:    Patient ID: [name], male    DOB: August 04, 1941, 76 y.o.   MRN: [id number]  HPI: [name] is a 76 y.o. male  Chief Complaint  Patient presents with  . 1 week follow up   [name] is here today for follow up on his wounds. He saw the wound center on Friday last week, was debrided and put in una boots. He was supposed to have dressing changes by home health 3x a week. He says that home health came out, but they didn't have the stuff. They are coming back to see him today. They have not been doing PT/home health or other nursing as the original home health referral we put in was cancelled when the wound center put in the new home health for una boots. He states that he needs some help with his house and with bathing. He is not sure how to get that. He is otherwise feeling well with no other concerns or complaints at this time.  Relevant past medical, surgical, family and social history reviewed and updated as indicated. Interim medical history since our last visit reviewed. Allergies and medications reviewed and updated.  Review of Systems  Constitutional: Negative.   Respiratory: Negative.   Cardiovascular: Negative.   Skin: Positive for wound. Negative for color change, pallor and rash.  Psychiatric/Behavioral: Negative.     Per HPI unless specifically indicated above     Objective:    BP 105/62   Pulse 73   Temp (!) 97.5 F (36.4 C)   Resp 16   Ht 5\' 5"  (1.651 m)   Wt 165 lb 2 oz (74.9 kg)   BMI 27.48 kg/m   Wt Readings from Last 3 Encounters:  07/15/17 165 lb 2 oz (74.9 kg)  07/15/17 165 lb 3.2 oz (74.9 kg)  07/05/17 173 lb 4 oz (78.6 kg)    Physical Exam  Constitutional: He is oriented to person, place, and time. He appears well-developed and well-nourished. No distress.  HENT:  Head: Normocephalic and  atraumatic.  Right Ear: Hearing normal.  Left Ear: Hearing normal.  Nose: Nose normal.  Eyes: Conjunctivae and lids are normal. Right eye exhibits no discharge. Left eye exhibits no discharge. No scleral icterus.  Cardiovascular: Normal rate, regular rhythm, normal heart sounds and intact distal pulses.  Exam reveals no gallop and no friction rub.   No murmur heard. Pulmonary/Chest: Effort normal and breath sounds normal. No respiratory distress. He has no wheezes. He has no rales. He exhibits no tenderness.  Musculoskeletal: Normal range of motion.  Neurological: He is alert and oriented to person, place, and time.  Skin: Skin is warm, dry and intact. No rash noted. No erythema. No pallor.  Una boot on R leg only  Psychiatric: He has a normal mood and affect. His speech is normal and behavior is normal. Judgment and thought content normal. Cognition and memory are normal.  Nursing note and vitals reviewed.   Results for orders placed or performed during the hospital encounter of 07/08/17  Aerobic Culture (superficial specimen)  Result Value Ref Range   Specimen Description LEG    Special Requests NONE    Gram Stain      ABUNDANT WBC PRESENT,BOTH  PMN AND MONONUCLEAR ABUNDANT GRAM NEGATIVE RODS ABUNDANT GRAM POSITIVE COCCI IN PAIRS Performed at [facility], [place] [address] [place], [place] [address]    Culture MULTIPLE ORGANISMS PRESENT, NONE PREDOMINANT (A)    Report Status 07/11/2017 FINAL       Assessment & Plan:   Problem List Items Addressed This Visit    None    Visit Diagnoses    Open wound of right lower leg, subsequent encounter    -  Primary   Continue to follow with wound. Home health ordered also for PT and bathing and nursing, not just una boots. Call with any concerns. Recheck 2-3 weeks.    Financial difficulties       Multiple needs. Will get social work involved. Referral generated today.   Relevant Orders   Ambulatory referral to [place] or economic circumstance       Multiple needs. Will get social work involved. Referral generated today.   Relevant Orders   [facility] referral to [facility]   Self-care deficit for bathing and hygiene       Home health ordered also for PT and bathing and nursing, not just una boots. Referral to social work also generated.    Relevant Orders   Ambulatory referral to [facility]       Follow up plan: Return 2-3 weeks, for follow up.

## 2017-07-18 DIAGNOSIS — I129 Hypertensive chronic kidney disease with stage 1 through stage 4 chronic kidney disease, or unspecified chronic kidney disease: Secondary | ICD-10-CM | POA: Diagnosis not present

## 2017-07-18 DIAGNOSIS — E1142 Type 2 diabetes mellitus with diabetic polyneuropathy: Secondary | ICD-10-CM | POA: Diagnosis not present

## 2017-07-18 DIAGNOSIS — N181 Chronic kidney disease, stage 1: Secondary | ICD-10-CM | POA: Diagnosis not present

## 2017-07-18 DIAGNOSIS — E1122 Type 2 diabetes mellitus with diabetic chronic kidney disease: Secondary | ICD-10-CM | POA: Diagnosis not present

## 2017-07-18 DIAGNOSIS — L03115 Cellulitis of right lower limb: Secondary | ICD-10-CM | POA: Diagnosis not present

## 2017-07-19 DIAGNOSIS — N181 Chronic kidney disease, stage 1: Secondary | ICD-10-CM | POA: Diagnosis not present

## 2017-07-19 DIAGNOSIS — L03119 Cellulitis of unspecified part of limb: Secondary | ICD-10-CM | POA: Diagnosis not present

## 2017-07-19 DIAGNOSIS — L03115 Cellulitis of right lower limb: Secondary | ICD-10-CM | POA: Diagnosis not present

## 2017-07-19 DIAGNOSIS — E1122 Type 2 diabetes mellitus with diabetic chronic kidney disease: Secondary | ICD-10-CM | POA: Diagnosis not present

## 2017-07-19 DIAGNOSIS — E1142 Type 2 diabetes mellitus with diabetic polyneuropathy: Secondary | ICD-10-CM | POA: Diagnosis not present

## 2017-07-19 DIAGNOSIS — I129 Hypertensive chronic kidney disease with stage 1 through stage 4 chronic kidney disease, or unspecified chronic kidney disease: Secondary | ICD-10-CM | POA: Diagnosis not present

## 2017-07-20 NOTE — Telephone Encounter (Signed)
Patient was seen in office 07/15/2017.

## 2017-07-21 DIAGNOSIS — N181 Chronic kidney disease, stage 1: Secondary | ICD-10-CM | POA: Diagnosis not present

## 2017-07-21 DIAGNOSIS — I129 Hypertensive chronic kidney disease with stage 1 through stage 4 chronic kidney disease, or unspecified chronic kidney disease: Secondary | ICD-10-CM | POA: Diagnosis not present

## 2017-07-21 DIAGNOSIS — E1142 Type 2 diabetes mellitus with diabetic polyneuropathy: Secondary | ICD-10-CM | POA: Diagnosis not present

## 2017-07-21 DIAGNOSIS — L03115 Cellulitis of right lower limb: Secondary | ICD-10-CM | POA: Diagnosis not present

## 2017-07-21 DIAGNOSIS — E1122 Type 2 diabetes mellitus with diabetic chronic kidney disease: Secondary | ICD-10-CM | POA: Diagnosis not present

## 2017-07-22 ENCOUNTER — Encounter: Payer: Medicare Other | Attending: Surgery | Admitting: Surgery

## 2017-07-22 DIAGNOSIS — S91302A Unspecified open wound, left foot, initial encounter: Secondary | ICD-10-CM | POA: Diagnosis not present

## 2017-07-22 DIAGNOSIS — L97512 Non-pressure chronic ulcer of other part of right foot with fat layer exposed: Secondary | ICD-10-CM | POA: Diagnosis not present

## 2017-07-22 DIAGNOSIS — E11622 Type 2 diabetes mellitus with other skin ulcer: Secondary | ICD-10-CM | POA: Diagnosis not present

## 2017-07-22 DIAGNOSIS — I739 Peripheral vascular disease, unspecified: Secondary | ICD-10-CM | POA: Diagnosis not present

## 2017-07-22 DIAGNOSIS — L97819 Non-pressure chronic ulcer of other part of right lower leg with unspecified severity: Secondary | ICD-10-CM | POA: Diagnosis not present

## 2017-07-22 DIAGNOSIS — I890 Lymphedema, not elsewhere classified: Secondary | ICD-10-CM | POA: Insufficient documentation

## 2017-07-22 DIAGNOSIS — L97812 Non-pressure chronic ulcer of other part of right lower leg with fat layer exposed: Secondary | ICD-10-CM | POA: Diagnosis not present

## 2017-07-24 NOTE — Progress Notes (Signed)
PAULETTE, LYNCH ([id number]) Visit Report for 07/22/2017 Arrival Information Details Patient Name: [name], [name] Date of Service: 07/22/2017 10:15 AM Medical Record Number: [id number] Patient Account Number: [id number] Date of Birth/Sex: 1941/10/09 (76 y.o. Male) Treating RN: [name] Primary Care [name]: [name] Other Clinician: Referring [name]: [name] Treating [name]/Extender: [name] in Treatment: 2 Visit Information History Since Last Visit Added or deleted any medications: No Patient Arrived: [name] Any new allergies or adverse reactions: No Arrival Time: 10:29 Had a fall or experienced change in No Accompanied By: self activities of daily living that may affect Transfer Assistance: None risk of falls: Patient Identification Verified: Yes Signs or symptoms of abuse/neglect since last No Secondary Verification Process Yes visito Completed: Hospitalized since last visit: No Patient Has Alerts: Yes Has Dressing in Place as Prescribed: Yes Patient Alerts: Patient on Blood Pain Present Now: No Thinner Aspirin 81 Mg Electronic Signature(s) Signed: 07/22/2017 4:29:15 PM By: [name], BSN, RN, CWS, Kim RN, BSN Entered By: [name], BSN, RN, CWS, [name] on 07/22/2017 10:30:15 [name], [name] ([id number]) -------------------------------------------------------------------------------- Compression Therapy Details Patient Name: [name], [name] Date of Service: 07/22/2017 10:15 AM Medical Record Number: [id number] Patient Account Number: [id number] Date of Birth/Sex: March 16, 1941 (76 y.o. Male) Treating RN: [name] Primary Care Leonel Mccollum: [name] Other Clinician: Referring [name]: [name] Treating [name]/Extender: [name] in Treatment: 2 Compression Therapy Performed for Wound Wound #2 Right,Medial Lower Leg Assessment: Performed By: Clinician Martin Barman, RN Compression Type: Three Layer Pre Treatment ABI: 1.1 Post  Procedure Diagnosis Same as Pre-procedure Electronic Signature(s) Signed: 07/22/2017 11:38:11 AM By: [name], BSN, RN, CWS, Kim RN, BSN Entered By: [name], BSN, RN, CWS, [name] on 07/22/2017 11:38:11 [name] ([id number]) -------------------------------------------------------------------------------- Encounter Discharge Information Details Patient Name: [name] Date of Service: 07/22/2017 10:15 AM Medical Record Number: [id number] Patient Account Number: [id number] Date of Birth/Sex: 1941-05-08 (76 y.o. Male) Treating RN: [name] Primary Care Koi Zangara: [name] Other Clinician: Referring [name]: [name] Treating [name]/Extender: [name] in Treatment: 2 Encounter Discharge Information Items Discharge Pain Level: 0 Discharge Condition: Stable Ambulatory Status: Walker Discharge Destination: Home Transportation: Private Auto Accompanied By: son Schedule Follow-up Appointment: Yes Medication Reconciliation completed and provided to Patient/Care Yes Saadiq Poche: Provided on Clinical Summary of Care: 07/22/2017 Form Type Recipient Paper Patient [facility] Electronic Signature(s) Signed: 07/22/2017 11:40:28 AM By: [name], BSN, RN, CWS, Kim RN, BSN Entered By: [name], BSN, RN, CWS, [name] on 07/22/2017 11:40:28 [name] ([id number]) -------------------------------------------------------------------------------- Lower Extremity Assessment Details Patient Name: [name], [name] Date of Service: 07/22/2017 10:15 AM Medical Record Number: [id number] Patient Account Number: [id number] Date of Birth/Sex: 1940-10-19 (76 y.o. Male) Treating RN: [name] Primary Care Erza Mothershead: [name] Other Clinician: Referring [name]: [name] Treating [name]/Extender: [name] in Treatment: 2 Edema Assessment Assessed: [Left: No] [Right: No] E[Left: dema] [Right: :] Calf Left: Right: Point of Measurement: 32 cm From Medial Instep 40.5  cm 39.5 cm Ankle Left: Right: Point of Measurement: 10 cm From Medial Instep 26.7 cm 26 cm Vascular Assessment Claudication: Claudication Assessment [Left:None] [Right:None] Pulses: Dorsalis Pedis Palpable: [Left:Yes] [Right:Yes] Posterior Tibial Extremity colors, hair growth, and conditions: Extremity Color: [Left:Red] [Right:Red] Hair Growth on Extremity: [Left:No] [Right:No] Temperature of Extremity: [Left:Warm] [Right:Warm] Capillary Refill: [Left:< 3 seconds] [Right:< 3 seconds] Dependent Rubor: [Left:No] [Right:No] Blanched when Elevated: [Left:No] [Right:No] Toe Nail Assessment Left: Right: Thick: Yes Yes Discolored: Yes Yes Deformed: Yes Yes Improper Length and Hygiene: Yes Yes Notes patients 4th  toenail on the right is barely attached. [name], [name] ([id number]) Electronic Signature(s) Signed: 07/22/2017 4:29:15 PM By: [name], BSN, RN, CWS, Kim RN, BSN Entered By: [name], BSN, RN, CWS, [name] on 07/22/2017 10:47:05 [name] ([id number]) -------------------------------------------------------------------------------- Multi Wound Chart Details Patient Name: [name] Date of Service: 07/22/2017 10:15 AM Medical Record Number: [id number] Patient Account Number: [id number] Date of Birth/Sex: 1941/10/02 (76 y.o. 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  N/A Date Acquired: 07/08/2017 05/11/2017 N/A Weeks of Treatment: 2 2 N/A Wound Status: Open Open N/A Measurements L x W x D 3x6x0.1 3x1.5x0.1 N/A (cm) Area (cm) : 14.137 3.534 N/A Volume (cm) : 1.414 0.353 N/A % Reduction in Area: 71.40% 70.00% N/A % Reduction in Volume: 71.40% 70.00% N/A Classification: N/A Grade 2 N/A Periwound Skin Texture: No Abnormalities Noted No Abnormalities Noted N/A Periwound Skin No Abnormalities Noted No Abnormalities Noted N/A Moisture: Periwound Skin Color: No Abnormalities Noted No Abnormalities Noted N/A Tenderness on No No N/A Palpation: Treatment Notes LEXIE, MORINI ([id number]) Electronic Signature(s) Signed: 07/22/2017 11:37:47 AM By: Christin Fudge MD, FACS Entered By: Christin Fudge on 07/22/2017 11:37:46 Chio, [name] ([id number]) -------------------------------------------------------------------------------- [place] Details Patient Name: [name] Date of Service: 07/22/2017 10:15 AM Medical Record Number: [id number] Patient Account Number: [id number] Date of Birth/Sex: [birth year] (76 y.o. Male) Treating RN: [name] Primary Care Calem Cocozza: [name] Other Clinician: Referring Jacqualyn Sedgwick: [name] Treating Brantlee Penn/Extender: [name] in Treatment: 2 Active Inactive ` Abuse / Safety / Falls / Self Care Management Nursing Diagnoses: History of Falls Potential for falls Goals: Patient will not experience any injury related to falls Date Initiated: 07/08/2017 Target Resolution Date: 07/15/2017 Goal Status: Active Patient will remain injury free related to falls Date Initiated: 07/08/2017 Target Resolution Date: 07/15/2017 Goal Status: Active Interventions: Assess fall risk on admission and as needed Treatment Activities: Patient referred to home care : 07/08/2017 Notes: ` Orientation to the Wound Care Program Nursing Diagnoses: Knowledge deficit related to the wound healing  center program Goals: Patient/caregiver will verbalize understanding of the [facility] Date Initiated: 07/08/2017 Target Resolution Date: 07/15/2017 Goal Status: Active Interventions: Provide education on orientation to the wound center [name], [name] ([id number]) Notes: ` Soft Tissue Infection Nursing Diagnoses: Impaired tissue integrity Potential for infection: soft tissue Goals: Patient/caregiver will verbalize understanding of or measures to prevent infection and contamination in the home setting Date Initiated: 07/08/2017 Target Resolution Date: 07/15/2017 Goal Status: Active Patient's soft tissue infection will resolve Date Initiated: 07/08/2017 Target Resolution Date: 07/15/2017 Goal Status: Active Interventions: Assess signs and symptoms of infection every visit Treatment Activities: Culture and sensitivity : 07/08/2017 Notes: ` Venous Leg Ulcer Nursing Diagnoses: Potential for venous Insuffiency (use before diagnosis confirmed) Goals: Non-invasive venous studies are completed as ordered Date Initiated: 07/08/2017 Target Resolution Date: 07/15/2017 Goal Status: Active Patient will maintain optimal edema control Date Initiated: 07/08/2017 Target Resolution Date: 07/15/2017 Goal Status: Active Interventions: Assess peripheral edema status every visit. Provide education on venous insufficiency Treatment Activities: Non-invasive vascular studies : 07/08/2017  [name], STORDAHL ([id number]) Venous Duplex Doppler : 07/09/2017 Notes: ` Wound/Skin Impairment Nursing Diagnoses: Impaired tissue integrity Goals: Patient/caregiver will verbalize understanding of skin care regimen Date Initiated: 07/08/2017 Target Resolution Date: 07/15/2017 Goal Status: Active Interventions: Assess ulceration(s) every visit Treatment Activities: Patient referred to home care : 07/08/2017 Topical wound management initiated : 07/08/2017 Notes: Electronic  Signature(s) Signed: 07/22/2017 4:29:15 PM By: [name], BSN, RN, CWS, Kim RN, BSN Entered By: [name], BSN, RN, CWS, [name] on 07/22/2017 10:47:10 [name], [name] ([id number]) -------------------------------------------------------------------------------- Pain Assessment Details Patient Name: Janine Ores C. Date of Service: 07/22/2017 10:15 AM Medical Record Number: [id number] Patient Account Number: [id number] Date of Birth/Sex: Dec 22, 1940 (76 y.o. Male) Treating RN: [name] Primary Care Laneice Meneely: [name] Other Clinician: Referring Gershom Brobeck: [name] Treating Vihan Santagata/Extender: [name] in Treatment: 2 Active Problems Location of Pain Severity and Description of Pain Patient Has Paino No Site Locations With Dressing Change: No Pain Management and Medication Current Pain Management: Electronic Signature(s) Signed: 07/22/2017 4:29:15 PM By: [name], BSN, RN, CWS, Kim RN, BSN Entered By: [name], BSN, RN, CWS, [name] on 07/22/2017 10:30:22 [name] ([id number]) -------------------------------------------------------------------------------- Patient/Caregiver Education Details Patient Name: [name] Date of Service: 07/22/2017 10:15 AM Medical Record Number: [id number] Patient Account Number: [id number] Date of Birth/Gender: [birth year] (76 y.o. Male) Treating RN: [name] Primary Care Physician: [name] Other Clinician: Referring Physician: Park [name] Treating Physician/Extender: [name] in Treatment: 2 Education Assessment Education Provided To: Patient Education Topics Provided Venous: Handouts: Controlling Swelling with Multilayered Compression Wraps, Other: do not get dressings wet Methods: Demonstration, Explain/Verbal Responses: State content correctly Electronic Signature(s) Signed: 07/22/2017 4:29:15 PM By: [name], BSN, RN, CWS, Kim RN, BSN Entered By: [name], BSN, RN, CWS, [name] on 07/22/2017 11:40:51 Lollis, [name] ([id number]) -------------------------------------------------------------------------------- Wound Assessment Details Patient Name: Zanni, Kipton C. Date of Service: 07/22/2017 10:15 AM Medical Record Number: [id number] Patient Account Number: [id number] Date of Birth/Sex: 12-30-1940 (76 y.o. Male) Treating RN: [name] Primary Care Eulalia Ellerman: [name] Other Clinician: Referring Cindel Daugherty: [name] Treating Jaleea Alesi/Extender: [name] in Treatment: 2 Wound Status Wound Number: 1 Primary Etiology: Diabetic Wound/Ulcer of the Lower Extremity Wound Location: Left, Distal, Dorsal Foot Secondary Fungal Wounding Event: Gradually Appeared Etiology: Date Acquired: 07/08/2017 Wound Status: Open Weeks Of Treatment: 2 Clustered Wound: No Photos Photo Uploaded By: [name], BSN, RN, CWS, [name] on 07/22/2017 10:48:37 Wound Measurements Length: (cm) 3 Width: (cm) 6 Depth: (cm) 0.1 Area: (cm) 14.137 Volume: (cm) 1.414 % Reduction in Area: 71.4% % Reduction in Volume: 71.4% Periwound Skin Texture Texture Color No Abnormalities Noted: No No Abnormalities Noted: No Moisture No Abnormalities Noted: No Treatment Notes Wound #1 (Left, Distal, Dorsal Foot) 1. Cleansed with: Clean wound with Normal Saline 3. Peri-wound Care: Antifungal powder 5. Secondary Dressing Applied ABD Pad Stoecker, Osiah C. ([id number]) Kerlix/Conform 7. Secured with Recruitment consultant) Signed: 07/22/2017 4:29:15 PM By: [name], BSN, RN, CWS, Kim RN, BSN Entered By: [name], BSN, RN, CWS, [name] on 07/22/2017 10:43:56 Degrasse, [name] ([id number]) -------------------------------------------------------------------------------- Wound Assessment Details Patient Name: Ice, Usman C. Date of Service: 07/22/2017 10:15 AM Medical Record Number: [id number] Patient Account Number: [id number] Date of Birth/Sex: [birth year] (76 y.o. Male) Treating RN: [name] Primary Care Treesa Mccully: [name] Other Clinician: Referring Linder Prajapati: [name] Treating Jovi Zavadil/Extender: [name] in Treatment: 2 Wound Status Wound Number: 2 Primary Etiology: Diabetic Wound/Ulcer of the Lower Extremity Wound Location: Right, Medial Lower Leg Secondary Lymphedema Wounding Event: Gradually Appeared Etiology:  Date Acquired: 05/11/2017 Wound Status: Open Weeks Of Treatment: 2 Clustered Wound: No Photos Photo Uploaded By: [name], BSN, RN, CWS, [name] on 07/22/2017 10:48:37 Wound Measurements Length: (cm) 3 Width: (cm) 1.5 Depth: (cm) 0.1 Area: (cm) 3.534 Volume: (cm) 0.353 % Reduction in Area: 70% % Reduction in Volume: 70% Wound Description Classification: Grade 2 Periwound Skin Texture Texture Color No Abnormalities Noted: No No Abnormalities Noted: No Moisture No Abnormalities Noted: No Treatment Notes Wound #2 (Right, Medial Lower Leg) 1. Cleansed with: Clean wound with Normal Saline 4. Dressing Applied: Gunby, Alessandro C. ([id number]) Other dressing (specify in notes) 5. Secondary Dressing Applied ABD Pad 7. Secured with 3 Layer Compression System - Right Lower Extremity Notes [employer] Electronic Signature(s) Signed: 07/22/2017 4:29:15 PM By: [name], BSN, RN, CWS, Kim RN, BSN Entered By: [name], BSN, RN, CWS, [name] on 07/22/2017 10:43:56 Igo, [name] ([id number]) -------------------------------------------------------------------------------- Vitals Details Patient Name: [name] Date of Service: 07/22/2017 10:15 AM Medical Record Number: [id number] Patient Account Number: [id number] Date of Birth/Sex: [birth year] (76 y.o. Male) Treating RN: [name] Primary Care Kaceton Vieau: [name] Other Clinician: Referring Burhan Barham: [name] Treating Glendon Dunwoody/Extender: [name] in Treatment: 2 Vital Signs Time Taken: 10:30 Temperature (F): 98.0 Unable to obtain height and weight: Patient Refused Pulse (bpm): 72 Respiratory  Rate (breaths/min): 16 Blood Pressure (mmHg): 127/45 Reference Range: 80 - 120 mg / dl Electronic Signature(s) Signed: 07/22/2017 4:29:15 PM By: [name], BSN, RN, CWS, Kim RN, BSN Entered By: [name], BSN, RN, CWS, [name] on 07/22/2017 10:31:35

## 2017-07-24 NOTE — Progress Notes (Signed)
LONDELL, NOLL ([id number]) Visit Report for 07/22/2017 Chief Complaint Document Details Patient Name: [name], [name] Date of Service: 07/22/2017 10:15 AM Medical Record Number: [id number] Patient Account Number: [id number] Date of Birth/Sex: 1940-11-07 (76 y.o. Male) Treating RN: Cornell Barman Primary Care Provider: Park Liter Other Clinician: Referring Provider: Park Liter Treating Provider/Extender: Frann Rider in Treatment: 2 Information Obtained from: Patient Chief Complaint Bilateral LE Lymphedema with left Lower Extermity Ulcer and right Forefoot Maceration Electronic Signature(s) Signed: 07/22/2017 11:37:58 AM By: Christin Fudge MD, FACS Entered By: Christin Fudge on 07/22/2017 11:37:57 Hagmann, [name] ([id number]) -------------------------------------------------------------------------------- HPI Details Patient Name: [name] Date of Service: 07/22/2017 10:15 AM Medical Record Number: [id number] Patient Account Number: [id number] Date of Birth/Sex: Nov 14, 1940 (76 y.o. Male) Treating RN: Cornell Barman Primary Care Provider: Park Liter Other Clinician: Referring Provider: Park Liter Treating Provider/Extender: Frann Rider in Treatment: 2 History of Present Illness HPI Description: 07/08/17 on evaluation today patient presents initially for issues that he has been having with bilateral lower extremities this has been going on for the best I can tell "about a month" he has been seen by his primary care provider at [facility] family practice and they have been performing wraps. They also did get him set up with home health although there was confusion about what company was providing the home health for him. Unfortunately patient with a poor historian and it was difficult to gather information about how this has really progressed over the past month. Nonetheless he does have erythema noted of the right lower extremity and ulceration at this  location as well. He has what appears to be lymphedema noted of the bilateral lower extremities and the left forefoot region is macerated potentially with a fungal infection noted as well. No fevers, chills, nausea, or vomiting noted at this time. As best I can gather patient has a history of hypothyroidism, hypertension, medicinal and antinausea medication he tells me due to the fact that he has been on the antibiotic. 07/22/2017 -- I'm reviewing this 76 year old gentleman for the first time today, and notes that he was recently in hospital but he got himself discharge AMA, and during this time he was treated for acute hyponatremia, cellulitis and bilateral leg ulceration with a history of alcohol abuse. The patient's hemoglobin A1c was 5.1 the last time he was in hospital and he was also noted to have cellulitis of the lower extremity and since he could not tolerate Augmentin was started on Bactrim. the patient and his son both have limited understanding and knowledge and it seems quite difficult to make them understand the treatment plan Electronic Signature(s) Signed: 07/22/2017 11:40:52 AM By: Christin Fudge MD, FACS Entered By: Christin Fudge on 07/22/2017 11:40:52 [place], [name] ([id number]) -------------------------------------------------------------------------------- Physical Exam Details Patient Name: [name], [name] Date of Service: 07/22/2017 10:15 AM Medical Record Number: [id number] Patient Account Number: [id number] Date of Birth/Sex: [birth year] (76 y.o. Male) Treating RN: Cornell Barman Primary Care Provider: Park Liter Other Clinician: Referring Provider: Park Liter Treating Provider/Extender: Frann Rider in Treatment: 2 Constitutional . Pulse regular. Respirations normal and unlabored. Afebrile. . Eyes Nonicteric. Reactive to light. Ears, Nose, Mouth, and Throat Lips, teeth, and gums WNL.Marland Kitchen Moist mucosa without lesions. Neck supple and nontender. No  palpable supraclavicular or cervical adenopathy. Normal sized without goiter. Respiratory WNL. No retractions.. Cardiovascular Pedal Pulses WNL. No clubbing, cyanosis or edema. Lymphatic No adneopathy. No adenopathy. No adenopathy. Musculoskeletal Adexa without tenderness or enlargement.. Digits and nails w/o clubbing,  cyanosis, infection, petechiae, ischemia, or inflammatory conditions.. Integumentary (Hair, Skin) No suspicious lesions. No crepitus or fluctuance. No peri-wound warmth or erythema. No masses.Marland Kitchen Psychiatric Judgement and insight Intact.. No evidence of depression, anxiety, or agitation.. Notes the patient's lymphedema is still persistent but the lymphoid he is much better and he seems to have much improvement in his right lower extremity. The left lower extremity has fungal infection on the foot but the lymphoid he has better and no sharp debridement was required today. Electronic Signature(s) Signed: 07/22/2017 11:41:26 AM By: Christin Fudge MD, FACS Entered By: Christin Fudge on 07/22/2017 11:41:25 [name], [name] ([id number]) -------------------------------------------------------------------------------- Physician Orders Details Patient Name: [name] Date of Service: 07/22/2017 10:15 AM Medical Record Number: [id number] Patient Account Number: [id number] Date of Birth/Sex: May 29, 1941 (76 y.o. Male) Treating RN: Cornell Barman Primary Care Provider: Park Liter Other Clinician: Referring Provider: Park Liter Treating Provider/Extender: Frann Rider in Treatment: 2 Verbal / Phone Orders: No Diagnosis Coding Wound Cleansing Wound #1 Left,Distal,Dorsal Foot o Clean wound with Normal Saline. o Cleanse wound with mild soap and water Wound #2 Right,Medial Lower Leg o Clean wound with Normal Saline. o Cleanse wound with mild soap and water Skin Barriers/Peri-Wound Care Wound #1 Left,Distal,Dorsal Foot o Antifungal powder-Nystatin Primary  Wound Dressing Wound #2 Right,Medial Lower Leg o Aquacel Ag Secondary Dressing Wound #2 Right,Medial Lower Leg o ABD pad o Dry Gauze o Conform/Kerlix Wound #1 Left,Distal,Dorsal Foot o ABD pad Dressing Change Frequency Wound #1 Left,Distal,Dorsal Foot o Change Dressing Monday, Wednesday, Friday - Monday, Wednesday, and Friday Wound #2 Right,Medial Lower Leg o Change Dressing Monday, Wednesday, Friday - Monday, Wednesday, and Friday Follow-up Appointments Wound #1 Left,Distal,Dorsal Foot o Return Appointment in 1 week. [name], [name] ([id number]) Edema Control Wound #2 Right,Medial Lower Leg o 3 Layer Compression System - Right Lower Extremity Additional Orders / Instructions Wound #1 Left,Distal,Dorsal Foot o Increase protein intake. Wound #2 Right,Medial Lower Leg o Increase protein intake. Home Health Wound #1 [place] Visits - [place] Nurse may visit PRN to address patientos wound care needs. o FACE TO FACE ENCOUNTER: MEDICARE and MEDICAID PATIENTS: I certify that this patient is under my care and that I had a face-to-face encounter that meets the physician face-to-face encounter requirements with this patient on this date. The encounter with the patient was in whole or in part for the following MEDICAL CONDITION: (primary reason for [place]) MEDICAL NECESSITY: I certify, that based on my findings, NURSING services are a medically necessary home health service. HOME BOUND STATUS: I certify that my clinical findings support that this patient is homebound (i.e., Due to illness or injury, pt requires aid of supportive devices such as crutches, cane, wheelchairs, walkers, the use of special transportation or the assistance of another person to leave their place of residence. There is a normal inability to leave the home and doing so requires considerable and taxing effort. Other absences  are for medical reasons / religious services and are infrequent or of short duration when for other reasons). o If current dressing causes regression in wound condition, may D/C ordered dressing product/s and apply Normal Saline Moist Dressing daily until next [place] / Other MD appointment. [place] of regression in wound condition at [phone]. o Please direct any NON-WOUND related issues/requests for orders to patient's Primary Care Physician Wound #2 Right,Medial Lower Leg o [place] Visits - [place] Nurse [phone]  visit PRN to address patientos wound care needs. o FACE TO FACE ENCOUNTER: MEDICARE and MEDICAID PATIENTS: I certify that this patient is under my care and that I had a face-to-face encounter that meets the physician face-to-face encounter requirements with this patient on this date. The encounter with the patient was in whole or in part for the following MEDICAL CONDITION: (primary reason for [place]) MEDICAL NECESSITY: I certify, that based on my findings, NURSING services are a medically necessary home health service. HOME BOUND STATUS: I certify that my clinical findings support that this patient is homebound (i.e., Due to illness or injury, pt requires aid of supportive devices such as crutches, cane, wheelchairs, walkers, the use of special transportation or the assistance of another person to leave their place of residence. There is a normal inability to leave the home and doing so requires considerable and taxing effort. Other absences are for medical reasons / religious services and are infrequent or of short duration when for other reasons). [name], [name] ([id number]) o If current dressing causes regression in wound condition, may D/C ordered dressing product/s and apply Normal Saline Moist Dressing daily until next [place] / Other MD appointment. [place] of  regression in wound condition at [phone]. o Please direct any NON-WOUND related issues/requests for orders to patient's Primary Care Physician Services and Therapies o Venous Studies -Bilateral Electronic Signature(s) Signed: 07/22/2017 4:18:43 PM By: Christin Fudge MD, FACS Signed: 07/22/2017 4:29:15 PM By: Gretta Cool, BSN, RN, CWS, Kim RN, BSN Entered By: Gretta Cool, BSN, RN, CWS, Kim on 07/22/2017 11:54:11 Mckelvy, [name] ([id number]) -------------------------------------------------------------------------------- Problem List Details Patient Name: [name], [name] Date of Service: 07/22/2017 10:15 AM Medical Record Number: [id number] Patient Account Number: [id number] Date of Birth/Sex: 02-24-1941 (76 y.o. 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 07/08/2017 Yes to breakdown of skin I73.9 Peripheral vascular disease, unspecified 07/22/2017 Yes Inactive Problems Resolved Problems ICD-10 Code Description Active Date Resolved Date E03.9 Hypothyroidism, unspecified 07/08/2017 07/08/2017 I10 Essential (primary) hypertension 07/08/2017 07/08/2017 Electronic Signature(s) Signed: 07/22/2017 11:37:42 AM By: Christin Fudge MD, FACS Entered By: Christin Fudge on 07/22/2017 11:37:42 [name], [name] ([id number]) -------------------------------------------------------------------------------- Progress Note Details Patient Name: [name], [name] Date of Service: 07/22/2017 10:15 AM Medical Record Number: [id number] Patient Account Number: [id number] Date  of Birth/Sex: Aug 14, 1941 (76 y.o. Male) Treating RN: Cornell Barman Primary Care Provider: Park Liter Other Clinician: Referring Provider: Park Liter Treating Provider/Extender: Frann Rider in Treatment: 2 Subjective Chief Complaint Information obtained from Patient Bilateral LE Lymphedema with left Lower Extermity Ulcer and right Forefoot Maceration History of Present Illness (HPI) 07/08/17 on evaluation today patient presents initially for issues that he has been having with bilateral lower extremities this has been going on for the best I can tell "about a month" he has been seen by his primary care provider at [facility] family practice and they have been performing wraps. They also did get him set up with home health although there was confusion about what company was providing the home health for him. Unfortunately patient with a poor historian and it was difficult to gather information about how this has  really progressed over the past month. Nonetheless he does have erythema noted of the right lower extremity and ulceration at this location as well. He has what appears to be lymphedema noted of the bilateral lower extremities and the left forefoot region is macerated potentially with a fungal infection noted as well. No fevers, chills, nausea, or vomiting noted at this time. As best I can gather patient has a history of hypothyroidism, hypertension, medicinal and antinausea medication he tells me due to the fact that he has been on the antibiotic. 07/22/2017 -- I'm reviewing this 76 year old gentleman for the first time today, and notes that he was recently in hospital but he got himself discharge AMA, and during this time he was treated for acute hyponatremia, cellulitis and bilateral leg ulceration with a history of alcohol abuse. The patient's hemoglobin A1c was 5.1 the last time he was in hospital and he was also noted to have cellulitis of the lower extremity and since  he could not tolerate Augmentin was started on Bactrim. the patient and his son both have limited understanding and knowledge and it seems quite difficult to make them understand the treatment plan Objective Constitutional Pulse regular. Respirations normal and unlabored. Afebrile. [name], [name] ([id number]) Vitals Time Taken: 10:30 AM, Temperature: 98.0 F, Pulse: 72 bpm, Respiratory Rate: 16 breaths/min, Blood Pressure: 127/45 mmHg. Eyes Nonicteric. Reactive to light. Ears, Nose, Mouth, and Throat Lips, teeth, and gums WNL.Marland Kitchen Moist mucosa without lesions. Neck supple and nontender. No palpable supraclavicular or cervical adenopathy. Normal sized without goiter. Respiratory WNL. No retractions.. Cardiovascular Pedal Pulses WNL. No clubbing, cyanosis or edema. Lymphatic No adneopathy. No adenopathy. No adenopathy. Musculoskeletal Adexa without tenderness or enlargement.. Digits and nails w/o clubbing, cyanosis, infection, petechiae, ischemia, or inflammatory conditions.Marland Kitchen Psychiatric Judgement and insight Intact.. No evidence of depression, anxiety, or agitation.. General Notes: the patient's lymphedema is still persistent but the lymphoid he is much better and he seems to have much improvement in his right lower extremity. The left lower extremity has fungal infection on the foot but the lymphoid he has better and no sharp debridement was required today. Integumentary (Hair, Skin) No suspicious lesions. No crepitus or fluctuance. No peri-wound warmth or erythema. No masses.. Wound #1 status is Open. Original cause of wound was Gradually Appeared. The wound is located on the Left,Distal,Dorsal Foot. The wound measures 3cm length x 6cm width x 0.1cm depth; 14.137cm^2 area and 1.414cm^3 volume. Wound #2 status is Open. Original cause of wound was Gradually Appeared. The wound is located on the Right,Medial Lower Leg. The wound measures 3cm length x 1.5cm width x 0.1cm depth;  3.534cm^2 area and 0.353cm^3 volume. Assessment Dubach, Daryl C. ([id number]) Active Problems ICD-10 I89.0 - Lymphedema, not elsewhere classified L97.812 - Non-pressure chronic ulcer of other part of right lower leg with fat layer exposed L97.521 - Non-pressure chronic ulcer of other part of left foot limited to breakdown of skin I73.9 - Peripheral vascular disease, unspecified Procedures Wound #2 Pre-procedure diagnosis of Wound #2 is a Diabetic Wound/Ulcer of the Lower Extremity located on the Right,Medial Lower Leg . There was a Three Layer Compression Therapy Procedure with a pre-treatment ABI of 1.1 by Cornell Barman, RN. Post procedure Diagnosis Wound #2: Same as Pre-Procedure Plan Wound Cleansing: Wound #1 Left,Distal,Dorsal Foot: Clean wound with Normal Saline. Cleanse wound with mild soap and water Wound #2 Right,Medial Lower Leg: Clean wound with Normal Saline. Cleanse wound with mild soap and water Skin Barriers/Peri-Wound Care: Wound #1 Left,Distal,Dorsal Foot: Antifungal  powder-Nystatin Primary Wound Dressing: Wound #2 Right,Medial Lower Leg: Aquacel Ag Secondary Dressing: Wound #2 Right,Medial Lower Leg: ABD pad Dry Gauze Conform/Kerlix Wound #1 Left,Distal,Dorsal Foot: ABD pad Dressing Change Frequency: Wound #1 Left,Distal,Dorsal Foot: Baetz, Lovell C. ([id number]) Change Dressing Monday, Wednesday, Friday - Monday, Wednesday, and Friday Wound #2 Right,Medial Lower Leg: Change Dressing Monday, Wednesday, Friday - Monday, Wednesday, and Friday Follow-up Appointments: Wound #1 Left,Distal,Dorsal Foot: Return Appointment in 1 week. Edema Control: Wound #2 Right,Medial Lower Leg: 3 Layer Compression System - Right Lower Extremity Additional Orders / Instructions: Wound #1 Left,Distal,Dorsal Foot: Increase protein intake. Wound #2 Right,Medial Lower Leg: Increase protein intake. Home Health: Wound #1 Left,Distal,Dorsal Foot: Continue Home Health Visits -  [facility] Nurse may visit PRN to address patient s wound care needs. FACE TO FACE ENCOUNTER: MEDICARE and MEDICAID PATIENTS: I certify that this patient is under my care and that I had a face-to-face encounter that meets the physician face-to-face encounter requirements with this patient on this date. The encounter with the patient was in whole or in part for the following MEDICAL CONDITION: (primary reason for [place]) MEDICAL NECESSITY: I certify, that based on my findings, NURSING services are a medically necessary home health service. HOME BOUND STATUS: I certify that my clinical findings support that this patient is homebound (i.e., Due to illness or injury, pt requires aid of supportive devices such as crutches, cane, wheelchairs, walkers, the use of special transportation or the assistance of another person to leave their place of residence. There is a normal inability to leave the home and doing so requires considerable and taxing effort. Other absences are for medical reasons / religious services and are infrequent or of short duration when for other reasons). If current dressing causes regression in wound condition, may D/C ordered dressing product/s and apply Normal Saline Moist Dressing daily until next [place] / Other MD appointment. [place] of regression in wound condition at [phone]. Please direct any NON-WOUND related issues/requests for orders to patient's Primary Care Physician Wound #2 Right,Medial Lower Leg: [place] Visits - [facility] Nurse may visit PRN to address patient s wound care needs. FACE TO FACE ENCOUNTER: MEDICARE and MEDICAID PATIENTS: I certify that this patient is under my care and that I had a face-to-face encounter that meets the physician face-to-face encounter requirements with this patient on this date. The encounter with the patient was in whole or in part for the following  MEDICAL CONDITION: (primary reason for [place]) MEDICAL NECESSITY: I certify, that based on my findings, NURSING services are a medically necessary home health service. HOME BOUND STATUS: I certify that my clinical findings support that this patient is homebound (i.e., Due to illness or injury, pt requires aid of supportive devices such as crutches, cane, wheelchairs, walkers, the use of special transportation or the assistance of another person to leave their place of residence. There is a normal inability to leave the home and doing so requires considerable and taxing effort. Other absences are for medical reasons / religious services and are infrequent or of short duration when for other reasons). If current dressing causes regression in wound condition, may D/C ordered dressing product/s and apply Normal Saline Moist Dressing daily until next [employer] / Other MD appointment. [place] of regression in wound condition at [phone]. Please direct any NON-WOUND related issues/requests for orders to patient's Primary Care Physician Services and Therapies ordered were: Venous Studies -  Bilateral HIDEO, GOOGE ([id number]) this 76 year old gentleman who has limited understanding of his problems, has multiple medical issues including diabetes and significant lymphedema with possible peripheral vascular disease and venous disease. After review I have recommended: 1. 3 layer Profore with silver alginate on the right lower extremity. 2. Antifungal ointment, ABDs pad and a Kerlix and Coban on the left lower extremity 3. Arterial duplex studies to be scheduled 4. Venous reflux studies to be scheduled 5. Regular visits to the wound center Electronic Signature(s) Signed: 07/22/2017 12:29:16 PM By: Christin Fudge MD, FACS Previous Signature: 07/22/2017 11:52:56 AM Version By: Christin Fudge MD, FACS Entered By: Christin Fudge on 07/22/2017 12:29:16 Nantz, Rowin  Loletha [name] ([id number]) -------------------------------------------------------------------------------- SuperBill Details Patient Name: Accomando, Landy C. Date of Service: 07/22/2017 Medical Record Number: [id number] Patient Account Number: [id number] Date of Birth/Sex: [birth year] (76 y.o. 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 I73.9 Peripheral vascular disease, unspecified Facility Procedures CPT4: Description Modifier Quantity Code [id number] (Facility Use Only) [phone] - [facility] RT 1 LEG Physician Procedures CPT4: Description Modifier Quantity Code [id number] 99213 - WC PHYS LEVEL 3 - EST PT 1 ICD-10 Description Diagnosis I89.0 Lymphedema, not elsewhere classified L97.812 Non-pressure chronic ulcer of other part of right lower leg with fat layer exposed L97.521  Non-pressure chronic ulcer of other part of left foot limited to breakdown of skin I73.9 Peripheral vascular disease, unspecified Electronic Signature(s) Signed: 07/22/2017 11:53:23 AM By: Christin Fudge MD, FACS Previous Signature: 07/22/2017 11:38:29 AM Version By: Gretta Cool, BSN, RN, CWS, Kim RN, BSN Entered By: Christin Fudge on 07/22/2017 11:53:22

## 2017-07-25 DIAGNOSIS — N181 Chronic kidney disease, stage 1: Secondary | ICD-10-CM | POA: Diagnosis not present

## 2017-07-25 DIAGNOSIS — I129 Hypertensive chronic kidney disease with stage 1 through stage 4 chronic kidney disease, or unspecified chronic kidney disease: Secondary | ICD-10-CM | POA: Diagnosis not present

## 2017-07-25 DIAGNOSIS — E1142 Type 2 diabetes mellitus with diabetic polyneuropathy: Secondary | ICD-10-CM | POA: Diagnosis not present

## 2017-07-25 DIAGNOSIS — E1122 Type 2 diabetes mellitus with diabetic chronic kidney disease: Secondary | ICD-10-CM | POA: Diagnosis not present

## 2017-07-25 DIAGNOSIS — L03115 Cellulitis of right lower limb: Secondary | ICD-10-CM | POA: Diagnosis not present

## 2017-07-26 DIAGNOSIS — N181 Chronic kidney disease, stage 1: Secondary | ICD-10-CM | POA: Diagnosis not present

## 2017-07-26 DIAGNOSIS — E1142 Type 2 diabetes mellitus with diabetic polyneuropathy: Secondary | ICD-10-CM | POA: Diagnosis not present

## 2017-07-26 DIAGNOSIS — L03115 Cellulitis of right lower limb: Secondary | ICD-10-CM | POA: Diagnosis not present

## 2017-07-26 DIAGNOSIS — E1122 Type 2 diabetes mellitus with diabetic chronic kidney disease: Secondary | ICD-10-CM | POA: Diagnosis not present

## 2017-07-26 DIAGNOSIS — I129 Hypertensive chronic kidney disease with stage 1 through stage 4 chronic kidney disease, or unspecified chronic kidney disease: Secondary | ICD-10-CM | POA: Diagnosis not present

## 2017-07-28 DIAGNOSIS — E1122 Type 2 diabetes mellitus with diabetic chronic kidney disease: Secondary | ICD-10-CM | POA: Diagnosis not present

## 2017-07-28 DIAGNOSIS — N181 Chronic kidney disease, stage 1: Secondary | ICD-10-CM | POA: Diagnosis not present

## 2017-07-28 DIAGNOSIS — E1142 Type 2 diabetes mellitus with diabetic polyneuropathy: Secondary | ICD-10-CM | POA: Diagnosis not present

## 2017-07-28 DIAGNOSIS — I129 Hypertensive chronic kidney disease with stage 1 through stage 4 chronic kidney disease, or unspecified chronic kidney disease: Secondary | ICD-10-CM | POA: Diagnosis not present

## 2017-07-28 DIAGNOSIS — L03115 Cellulitis of right lower limb: Secondary | ICD-10-CM | POA: Diagnosis not present

## 2017-07-29 ENCOUNTER — Encounter: Payer: Self-pay | Admitting: Family Medicine

## 2017-07-29 ENCOUNTER — Ambulatory Visit (INDEPENDENT_AMBULATORY_CARE_PROVIDER_SITE_OTHER): Payer: Medicare Other | Admitting: Family Medicine

## 2017-07-29 ENCOUNTER — Encounter: Payer: Medicare Other | Admitting: Surgery

## 2017-07-29 ENCOUNTER — Telehealth: Payer: Self-pay | Admitting: Family Medicine

## 2017-07-29 VITALS — BP 131/68 | HR 71 | Temp 98.6°F | Wt 179.2 lb

## 2017-07-29 DIAGNOSIS — L97819 Non-pressure chronic ulcer of other part of right lower leg with unspecified severity: Secondary | ICD-10-CM | POA: Diagnosis not present

## 2017-07-29 DIAGNOSIS — L97512 Non-pressure chronic ulcer of other part of right foot with fat layer exposed: Secondary | ICD-10-CM | POA: Diagnosis not present

## 2017-07-29 DIAGNOSIS — I739 Peripheral vascular disease, unspecified: Secondary | ICD-10-CM | POA: Diagnosis not present

## 2017-07-29 DIAGNOSIS — I890 Lymphedema, not elsewhere classified: Secondary | ICD-10-CM | POA: Diagnosis not present

## 2017-07-29 DIAGNOSIS — E11622 Type 2 diabetes mellitus with other skin ulcer: Secondary | ICD-10-CM | POA: Diagnosis not present

## 2017-07-29 DIAGNOSIS — I129 Hypertensive chronic kidney disease with stage 1 through stage 4 chronic kidney disease, or unspecified chronic kidney disease: Secondary | ICD-10-CM

## 2017-07-29 DIAGNOSIS — S81801D Unspecified open wound, right lower leg, subsequent encounter: Secondary | ICD-10-CM | POA: Diagnosis not present

## 2017-07-29 DIAGNOSIS — D72819 Decreased white blood cell count, unspecified: Secondary | ICD-10-CM | POA: Diagnosis not present

## 2017-07-29 DIAGNOSIS — L97529 Non-pressure chronic ulcer of other part of left foot with unspecified severity: Secondary | ICD-10-CM | POA: Diagnosis not present

## 2017-07-29 DIAGNOSIS — L97812 Non-pressure chronic ulcer of other part of right lower leg with fat layer exposed: Secondary | ICD-10-CM | POA: Diagnosis not present

## 2017-07-29 NOTE — Progress Notes (Signed)
BP 131/68 (BP Location: Left Arm, Patient Position: Sitting, Cuff Size: Normal)   Pulse 71   Temp 98.6 F (37 C)   Wt 179 lb 4 oz (81.3 kg)   SpO2 100%   BMI 29.83 kg/m    Subjective:    Patient ID: [name], male    DOB: 06-Jun-1941, 76 y.o.   MRN: [id number]  HPI: [name] is a 76 y.o. male  Chief Complaint  Patient presents with  . Wound Check   HYPERTENSION Hypertension status: controlled  Satisfied with current treatment? yes Duration of hypertension: chronic BP monitoring frequency:  not checking BP medication side effects:  no Medication compliance: good compliance Aspirin: no Recurrent headaches: no Visual changes: no Palpitations: no Dyspnea: no Chest pain: no Lower extremity edema: no Dizzy/lightheaded: no   Continuing to follow with Wound. Wearing his una boots. Feeling better and back to himself. No other concerns or complaints at this time.   Relevant past medical, surgical, family and social history reviewed and updated as indicated. Interim medical history since our last visit reviewed. Allergies and medications reviewed and updated.  Review of Systems  Constitutional: Negative.   Respiratory: Negative.   Cardiovascular: Negative.   Skin: Positive for wound. Negative for color change, pallor and rash.  Psychiatric/Behavioral: Negative.     Per HPI unless specifically indicated above     Objective:    BP 131/68 (BP Location: Left Arm, Patient Position: Sitting, Cuff Size: Normal)   Pulse 71   Temp 98.6 F (37 C)   Wt 179 lb 4 oz (81.3 kg)   SpO2 100%   BMI 29.83 kg/m   Wt Readings from Last 3 Encounters:  07/29/17 179 lb 4 oz (81.3 kg)  07/15/17 165 lb 2 oz (74.9 kg)  07/15/17 165 lb 3.2 oz (74.9 kg)    Physical Exam  Constitutional: He is oriented to person, place, and time. He appears well-developed and well-nourished. No distress.  HENT:  Head: Normocephalic and atraumatic.  Right Ear: Hearing normal.  Left Ear:  Hearing normal.  Nose: Nose normal.  Eyes: Conjunctivae and lids are normal. Right eye exhibits no discharge. Left eye exhibits no discharge. No scleral icterus.  Cardiovascular: Normal rate, regular rhythm, normal heart sounds and intact distal pulses.  Exam reveals no gallop and no friction rub.   No murmur heard. Pulmonary/Chest: Effort normal and breath sounds normal. No respiratory distress. He has no wheezes. He has no rales. He exhibits no tenderness.  Musculoskeletal: Normal range of motion. He exhibits no edema.  Una boots bilateral legs  Neurological: He is alert and oriented to person, place, and time.  Skin: Skin is warm, dry and intact. No rash noted. He is not diaphoretic. No erythema. No pallor.  Psychiatric: He has a normal mood and affect. His speech is normal and behavior is normal. Judgment and thought content normal. Cognition and memory are normal.  Nursing note and vitals reviewed.   Results for orders placed or performed during the hospital encounter of 07/08/17  Aerobic Culture (superficial specimen)  Result Value Ref Range   Specimen Description LEG    Special Requests NONE    Gram Stain      ABUNDANT WBC PRESENT,BOTH PMN AND MONONUCLEAR ABUNDANT GRAM NEGATIVE RODS ABUNDANT GRAM POSITIVE COCCI IN PAIRS Performed at [facility], [place] [address] [place], [place] [address]    Culture MULTIPLE ORGANISMS PRESENT, NONE PREDOMINANT (A)    Report Status 07/11/2017 FINAL  Assessment & Plan:   Problem List Items Addressed This Visit      Genitourinary   Benign hypertensive renal disease - Primary    Doing better. Rechecking levels. Continue current regimen. Continue to monitor.      Relevant Orders   Comprehensive metabolic panel     Other   Leukopenia    Stable. Rechecking levels. Continue to monitor.      Relevant Orders   CBC with Differential/Platelet    Other Visit Diagnoses    Open wound of right lower leg, subsequent encounter         Continue to follow with wound and home health. Call with any concerns.        Follow up plan: Return in about 2 months (around 09/28/2017) for DM visit.

## 2017-07-29 NOTE — Assessment & Plan Note (Signed)
Stable. Rechecking levels. Continue to monitor.

## 2017-07-29 NOTE — Telephone Encounter (Signed)
Routing to provider  

## 2017-07-29 NOTE — Telephone Encounter (Signed)
Just FYI--Betty with [facility] called to let Dr Wynetta Emery know she missed his appointment for wound care on Wednesday as she thought he had an appointment at the [facility] that day but she was incorrect and is very sorry.  Thanks

## 2017-07-29 NOTE — Telephone Encounter (Signed)
No worries.  Thanks!

## 2017-07-29 NOTE — Assessment & Plan Note (Signed)
Doing better. Rechecking levels. Continue current regimen. Continue to monitor.

## 2017-07-30 LAB — COMPREHENSIVE METABOLIC PANEL
ALBUMIN: 3.2 g/dL — AB (ref 3.5–4.8)
ALT: 25 IU/L (ref 0–44)
AST: 36 IU/L (ref 0–40)
Albumin/Globulin Ratio: 1.9 (ref 1.2–2.2)
Alkaline Phosphatase: 189 IU/L — ABNORMAL HIGH (ref 39–117)
BUN / CREAT RATIO: 10 (ref 10–24)
BUN: 13 mg/dL (ref 8–27)
Bilirubin Total: 0.6 mg/dL (ref 0.0–1.2)
CALCIUM: 8.5 mg/dL — AB (ref 8.6–10.2)
CO2: 20 mmol/L (ref 20–29)
CREATININE: 1.3 mg/dL — AB (ref 0.76–1.27)
Chloride: 102 mmol/L (ref 96–106)
GFR calc Af Amer: 61 mL/min/{1.73_m2} (ref >59)
GFR calc non Af Amer: 53 mL/min/{1.73_m2} — ABNORMAL LOW (ref >59)
GLUCOSE: 126 mg/dL — AB (ref 65–99)
Globulin, Total: 1.7 g/dL (ref 1.5–4.5)
Potassium: 4.8 mmol/L (ref 3.5–5.2)
Sodium: 135 mmol/L (ref 134–144)
TOTAL PROTEIN: 4.9 g/dL — AB (ref 6.0–8.5)

## 2017-07-30 LAB — CBC WITH DIFFERENTIAL/PLATELET
Basophils Absolute: 0 10*3/uL (ref 0.0–0.2)
Basos: 1 %
EOS (ABSOLUTE): 0.1 10*3/uL (ref 0.0–0.4)
EOS: 2 %
HEMOGLOBIN: 7.2 g/dL — AB (ref 13.0–17.7)
Hematocrit: 20.3 % — ABNORMAL LOW (ref 37.5–51.0)
IMMATURE GRANS (ABS): 0 10*3/uL (ref 0.0–0.1)
IMMATURE GRANULOCYTES: 0 %
LYMPHS ABS: 0.6 10*3/uL — AB (ref 0.7–3.1)
Lymphs: 18 %
MCH: 40 pg — ABNORMAL HIGH (ref 26.6–33.0)
MCHC: 35.5 g/dL (ref 31.5–35.7)
MCV: 113 fL — ABNORMAL HIGH (ref 79–97)
MONOCYTES: 8 %
Monocytes Absolute: 0.2 10*3/uL (ref 0.1–0.9)
Neutrophils Absolute: 2.2 10*3/uL (ref 1.4–7.0)
Neutrophils: 71 %
Platelets: 87 10*3/uL — CL (ref 150–379)
RBC: 1.8 x10E6/uL — AB (ref 4.14–5.80)
RDW: 14.3 % (ref 12.3–15.4)
WBC: 3.1 10*3/uL — ABNORMAL LOW (ref 3.4–10.8)

## 2017-08-01 ENCOUNTER — Telehealth: Payer: Self-pay | Admitting: Family Medicine

## 2017-08-01 ENCOUNTER — Telehealth: Payer: Self-pay | Admitting: *Deleted

## 2017-08-01 DIAGNOSIS — I129 Hypertensive chronic kidney disease with stage 1 through stage 4 chronic kidney disease, or unspecified chronic kidney disease: Secondary | ICD-10-CM | POA: Diagnosis not present

## 2017-08-01 DIAGNOSIS — E1122 Type 2 diabetes mellitus with diabetic chronic kidney disease: Secondary | ICD-10-CM | POA: Diagnosis not present

## 2017-08-01 DIAGNOSIS — N181 Chronic kidney disease, stage 1: Secondary | ICD-10-CM | POA: Diagnosis not present

## 2017-08-01 DIAGNOSIS — E1142 Type 2 diabetes mellitus with diabetic polyneuropathy: Secondary | ICD-10-CM | POA: Diagnosis not present

## 2017-08-01 DIAGNOSIS — L03115 Cellulitis of right lower limb: Secondary | ICD-10-CM | POA: Diagnosis not present

## 2017-08-01 NOTE — Telephone Encounter (Signed)
Can we please see about getting him into see hematology sooner? He saw them in February and was given a year- but his blood count has dropped from 9.6 to 7.2 and I think he needs to see them again sooner.   When we get that scheduled, the rest of his labs look stable- if you'd let him know, that'd be great! Thanks!

## 2017-08-01 NOTE — Progress Notes (Signed)
LESS, WOOLSEY ([id number]) Visit Report for 07/29/2017 Chief Complaint Document Details Patient Name: [name], [name] Date of Service: 07/29/2017 2:45 PM Medical Record Number: [id number] Patient Account Number: [id number] Date of Birth/Sex: 04-19-1941 (76 y.o. Male) Treating RN: Cornell Barman Primary Care Provider: Park Liter Other Clinician: Referring Provider: Park Liter Treating Provider/Extender: Frann Rider in Treatment: 3 Information Obtained from: Patient Chief Complaint Bilateral LE Lymphedema with left Lower Extermity Ulcer and right Forefoot Maceration Electronic Signature(s) Signed: 07/29/2017 3:23:47 PM By: Christin Fudge MD, FACS Entered By: Christin Fudge on 07/29/2017 15:23:45 Kimberling, [name] ([id number]) -------------------------------------------------------------------------------- HPI Details Patient Name: [name] Date of Service: 07/29/2017 2:45 PM Medical Record Number: [id number] Patient Account Number: [id number] Date of Birth/Sex: April 02, 1941 (76 y.o. Male) Treating RN: Cornell Barman Primary Care Provider: Park Liter Other Clinician: Referring Provider: Park Liter Treating Provider/Extender: Frann Rider in Treatment: 3 History of Present Illness HPI Description: 07/08/17 on evaluation today patient presents initially for issues that he has been having with bilateral lower extremities this has been going on for the best I can tell "about a month" he has been seen by Martin primary care provider at [facility] family practice and they have been performing wraps. They also did get him set up with home health although there was confusion about what company was providing the home health for him. Unfortunately patient with a poor historian and it was difficult to gather information about how this has really progressed over the past month. Nonetheless he does have erythema noted of the right lower extremity and ulceration at this location as  well. He has what appears to be lymphedema noted of the bilateral lower extremities and the left forefoot region is macerated potentially with a fungal infection noted as well. No fevers, chills, nausea, or vomiting noted at this time. As best I can gather patient has a history of hypothyroidism, hypertension, medicinal and antinausea medication he tells me due to the fact that he has been on the antibiotic. 07/22/2017 -- I'm reviewing this 76 year old gentleman for the first time today, and notes that he was recently in hospital but he got himself discharge AMA, and during this time he was treated for acute hyponatremia, cellulitis and bilateral leg ulceration with a history of alcohol abuse. The patient's hemoglobin A1c was 5.1 the last time he was in hospital and he was also noted to have cellulitis of the lower extremity and since he could not tolerate Augmentin was started on Bactrim. the patient and Martin son both have limited understanding and knowledge and it seems quite difficult to make them understand the treatment plan Electronic Signature(s) Signed: 07/29/2017 3:24:08 PM By: Christin Fudge MD, FACS Entered By: Christin Fudge on 07/29/2017 15:24:07 [name] ([id number]) -------------------------------------------------------------------------------- Physical Exam Details Patient Name: [name], [name] Date of Service: 07/29/2017 2:45 PM Medical Record Number: [id number] Patient Account Number: [id number] Date of Birth/Sex: [birth year] (76 y.o. Male) Treating RN: Cornell Barman Primary Care Provider: Park Liter Other Clinician: Referring Provider: Park Liter Treating Provider/Extender: Frann Rider in Treatment: 3 Constitutional . Pulse regular. Respirations normal and unlabored. Afebrile. . Eyes Nonicteric. Reactive to light. Ears, Nose, Mouth, and Throat Lips, teeth, and gums WNL.Marland Kitchen Moist mucosa without lesions. Neck supple and nontender. No palpable  supraclavicular or cervical adenopathy. Normal sized without goiter. Respiratory WNL. No retractions.. Cardiovascular Pedal Pulses WNL. No clubbing, cyanosis or edema. Lymphatic No adneopathy. No adenopathy. No adenopathy. Musculoskeletal Adexa without tenderness or enlargement.. Digits and nails w/o clubbing,  cyanosis, infection, petechiae, ischemia, or inflammatory conditions.. Integumentary (Hair, Skin) No suspicious lesions. No crepitus or fluctuance. No peri-wound warmth or erythema. No masses.Marland Kitchen Psychiatric Judgement and insight Intact.. No evidence of depression, anxiety, or agitation.. Notes the patient's lymphedema and the excoriation on Martin legs have gone down significantly and overall there has been much improvement. No sharp debridement was required today. Electronic Signature(s) Signed: 07/29/2017 3:24:49 PM By: Christin Fudge MD, FACS Entered By: Christin Fudge on 07/29/2017 15:24:48 [name] ([id number]) -------------------------------------------------------------------------------- Physician Orders Details Patient Name: [name] Date of Service: 07/29/2017 2:45 PM Medical Record Number: [id number] Patient Account Number: [id number] Date of Birth/Sex: 09-Apr-1941 (76 y.o. Male) Treating RN: Cornell Barman Primary Care Provider: Park Liter Other Clinician: Referring Provider: Park Liter Treating Provider/Extender: Frann Rider in Treatment: 3 Verbal / Phone Orders: No Diagnosis Coding Wound Cleansing Wound #1 Left,Distal,Dorsal Foot o Clean wound with Normal Saline. o Cleanse wound with mild soap and water Wound #2 Right,Medial Lower Leg o Clean wound with Normal Saline. o Cleanse wound with mild soap and water Primary Wound Dressing Wound #1 Left,Distal,Dorsal Foot o Aquacel Ag Wound #2 Right,Medial Lower Leg o Aquacel Ag Dressing Change Frequency Wound #1 Left,Distal,Dorsal Foot o Change Dressing Monday, Wednesday,  Friday - Monday, Wednesday, and Friday Wound #2 Right,Medial Lower Leg o Change Dressing Monday, Wednesday, Friday - Monday, Wednesday, and Friday Follow-up Appointments Wound #1 Left,Distal,Dorsal Foot o Return Appointment in 1 week. Wound #2 Right,Medial Lower Leg o Return Appointment in 1 week. Edema Control Wound #1 Left,Distal,Dorsal Foot o Kerlix and Coban - Left Lower Extremity Wound #2 Right,Medial Lower Leg o 3 Layer Compression System - Right Lower Extremity Additional Orders / Instructions Wound #1 Left,Distal,Dorsal Foot o Increase protein intake. o Other: - HHRN to order velcro compression garments bilaterally Wound #2 Right,Medial Lower Leg Imel, Issac C. ([id number]) o Increase protein intake. o Other: - HHRN to order velcro compression garments bilaterally Home Health Wound #1 [place] Visits - [place]: HHRN to order velcro compression garments bilaterally o Home Health Nurse may visit PRN to address patientos wound care needs. o FACE TO FACE ENCOUNTER: MEDICARE and MEDICAID PATIENTS: I certify that this patient is under my care and that I had a face-to-face encounter that meets the physician face-to-face encounter requirements with this patient on this date. The encounter with the patient was in whole or in part for the following MEDICAL CONDITION: (primary reason for [place]) MEDICAL NECESSITY: I certify, that based on my findings, NURSING services are a medically necessary home health service. HOME BOUND STATUS: I certify that my clinical findings support that this patient is homebound (i.e., Due to illness or injury, pt requires aid of supportive devices such as crutches, cane, wheelchairs, walkers, the use of special transportation or the assistance of another person to leave their place of residence. There is a normal inability to leave the home and doing so requires considerable and taxing  effort. Other absences are for medical reasons / religious services and are infrequent or of short duration when for other reasons). o If current dressing causes regression in wound condition, may D/C ordered dressing product/s and apply Normal Saline Moist Dressing daily until next [place] / Other MD appointment. [place] of regression in wound condition at [phone]. o Please direct any NON-WOUND related issues/requests for orders to patient's Primary Care Physician Wound #2 [place] Visits - [place]: HHRN to  order velcro compression garments bilaterally o Home Health Nurse may visit PRN to address patientos wound care needs. o FACE TO FACE ENCOUNTER: MEDICARE and MEDICAID PATIENTS: I certify that this patient is under my care and that I had a face-to-face encounter that meets the physician face-to-face encounter requirements with this patient on this date. The encounter with the patient was in whole or in part for the following MEDICAL CONDITION: (primary reason for [place]) MEDICAL NECESSITY: I certify, that based on my findings, NURSING services are a medically necessary home health service. HOME BOUND STATUS: I certify that my clinical findings support that this patient is homebound (i.e., Due to illness or injury, pt requires aid of supportive devices such as crutches, cane, wheelchairs, walkers, the use of special transportation or the assistance of another person to leave their place of residence. There is a normal inability to leave the home and doing so requires considerable and taxing effort. Other absences are for medical reasons / religious services and are infrequent or of short duration when for other reasons). o If current dressing causes regression in wound condition, may D/C ordered dressing product/s and apply Normal Saline Moist Dressing daily until next [place] / Other MD  appointment. [place] of regression in wound condition at [phone]. o Please direct any NON-WOUND related issues/requests for orders to patient's Primary Care Physician Electronic Signature(s) Signed: 07/29/2017 4:07:58 PM By: Christin Fudge MD, FACS Signed: 07/29/2017 4:13:04 PM By: Gretta Cool, BSN, RN, CWS, Kim RN, BSN Entered By: Gretta Cool, BSN, RN, CWS, Kim on 07/29/2017 15:19:54 [name], [name] ([id number]) -------------------------------------------------------------------------------- Problem List Details Patient Name: Natividad, Wessley C. Date of Service: 07/29/2017 2:45 PM Medical Record Number: [id number] Patient Account Number: [id number] Date of Birth/Sex: [birth year] (76 y.o. 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 L97.521 Non-pressure chronic ulcer of other part of left foot limited to 07/08/2017 Yes breakdown of skin I73.9 Peripheral vascular disease, unspecified 07/22/2017 Yes Inactive Problems Resolved Problems ICD-10 Code Description Active Date Resolved Date I10 Essential (primary) hypertension 07/08/2017 07/08/2017 E03.9 Hypothyroidism, unspecified 07/08/2017 07/08/2017 Electronic Signature(s) Signed: 07/29/2017 3:23:27 PM By: Christin Fudge MD, FACS Entered By: Christin Fudge on 07/29/2017 15:23:27 [place]enbach, [name] ([id number]) -------------------------------------------------------------------------------- Progress Note Details Patient Name: Abdelrahman, Lonie C. Date of Service: 07/29/2017 2:45 PM Medical Record Number: [id number] Patient Account Number: [id number] Date of Birth/Sex:  Dec 13, 1940 (76 y.o. Male) Treating RN: Cornell Barman Primary Care Provider: Park Liter Other Clinician: Referring Provider: Park Liter Treating Provider/Extender: Frann Rider in Treatment: 3 Subjective Chief Complaint Information obtained from Patient Bilateral LE Lymphedema with left Lower Extermity Ulcer and right Forefoot Maceration History of Present Illness (HPI) 07/08/17 on evaluation today patient presents initially for issues that he has been having with bilateral lower extremities this has been going on for the best I can tell "about a month" he has been seen by Martin primary care provider at [facility] family practice and they have been performing wraps. They also did get him set up with home health although there was confusion about what company was providing the home health for him. Unfortunately patient with a poor historian and it was difficult to gather information about how this has really  progressed over the past month. Nonetheless he does have erythema noted of the right lower extremity and ulceration at this location as well. He has what appears to be lymphedema noted of the bilateral lower extremities and the left forefoot region is macerated potentially with a fungal infection noted as well. No fevers, chills, nausea, or vomiting noted at this time. As best I can gather patient has a history of hypothyroidism, hypertension, medicinal and antinausea medication he tells me due to the fact that he has been on the antibiotic. 07/22/2017 -- I'm reviewing this 76 year old gentleman for the first time today, and notes that he was recently in hospital but he got himself discharge AMA, and during this time he was treated for acute hyponatremia, cellulitis and bilateral leg ulceration with a history of alcohol abuse. The patient's hemoglobin A1c was 5.1 the last time he was in hospital and he was also noted to have cellulitis of the lower extremity and since he could not  tolerate Augmentin was started on Bactrim. the patient and Martin son both have limited understanding and knowledge and it seems quite difficult to make them understand the treatment plan Objective Constitutional Pulse regular. Respirations normal and unlabored. Afebrile. Vitals Time Taken: 2:56 PM, Pulse: 77 bpm, Respiratory Rate: 16 breaths/min, Blood Pressure: 132/40 mmHg. Eyes Nonicteric. Reactive to light. Ears, Nose, Mouth, and Throat Lips, teeth, and gums WNL.Marland Kitchen Moist mucosa without lesions. [name], [name] ([id number]) Neck supple and nontender. No palpable supraclavicular or cervical adenopathy. Normal sized without goiter. Respiratory WNL. No retractions.. Cardiovascular Pedal Pulses WNL. No clubbing, cyanosis or edema. Lymphatic No adneopathy. No adenopathy. No adenopathy. Musculoskeletal Adexa without tenderness or enlargement.. Digits and nails w/o clubbing, cyanosis, infection, petechiae, ischemia, or inflammatory conditions.Marland Kitchen Psychiatric Judgement and insight Intact.. No evidence of depression, anxiety, or agitation.. General Notes: the patient's lymphedema and the excoriation on Martin legs have gone down significantly and overall there has been much improvement. No sharp debridement was required today. Integumentary (Hair, Skin) No suspicious lesions. No crepitus or fluctuance. No peri-wound warmth or erythema. No masses.. Wound #1 status is Open. Original cause of wound was Gradually Appeared. The wound is located on the Left,Distal,Dorsal Foot. The wound measures 0.1cm length x 0.1cm width x 0.1cm depth; 0.008cm^2 area and 0.001cm^3 volume. Wound #2 status is Open. Original cause of wound was Gradually Appeared. The wound is located on the Right,Medial Lower Leg. The wound measures 0.1cm length x 0.1cm width x 0.1cm depth; 0.008cm^2 area and 0.001cm^3 volume. 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 There was a Three Layer Compression Therapy Procedure with a pre-treatment ABI of 1.1 by Cornell Barman, RN. Post procedure Diagnosis Wound #2: Same as Pre-Procedure [name], Odarius C. ([id number]) Plan Wound Cleansing: Wound #1 Left,Distal,Dorsal Foot: Clean wound with Normal Saline. Cleanse wound with mild soap and water Wound #2 Right,Medial Lower Leg: Clean wound with Normal Saline. Cleanse wound with mild soap and water Primary Wound Dressing: Wound #1 Left,Distal,Dorsal Foot: Aquacel Ag Wound #2 Right,Medial Lower Leg: Aquacel Ag Dressing Change Frequency: Wound #1 Left,Distal,Dorsal Foot: Change Dressing Monday, Wednesday, Friday - Monday, Wednesday, and Friday Wound #  2 Right,Medial Lower Leg: Change Dressing Monday, Wednesday, Friday - Monday, Wednesday, and Friday Follow-up Appointments: Wound #1 Left,Distal,Dorsal Foot: Return Appointment in 1 week. Wound #2 Right,Medial Lower Leg: Return Appointment in 1 week. Edema Control: Wound #1 Left,Distal,Dorsal Foot: Kerlix and Coban - Left Lower Extremity Wound #2 Right,Medial Lower Leg: 3 Layer Compression System - Right Lower Extremity Additional Orders / Instructions: Wound #1 Left,Distal,Dorsal Foot: Increase protein intake. Other: - HHRN to order velcro compression garments bilaterally Wound #2 Right,Medial Lower Leg: Increase protein intake. Other: - HHRN to order velcro compression garments bilaterally Home Health: Wound #1 Left,Distal,Dorsal Foot: Continue Home Health Visits - [facility]: HHRN to order velcro compression garments bilaterally Home Health Nurse may visit PRN to  address patient s wound care needs. FACE TO FACE ENCOUNTER: MEDICARE and MEDICAID PATIENTS: I certify that this patient is under my care and that I had a face-to-face encounter that meets the physician face-to-face encounter requirements with this patient on this date. The encounter with the patient was in whole or in part for the following MEDICAL CONDITION: (primary reason for [place]) MEDICAL NECESSITY: I certify, that based on my findings, NURSING services are a medically necessary home health service. HOME BOUND STATUS: I certify that my clinical findings support that this patient is homebound (i.e., Due to illness or injury, pt requires aid of supportive devices such as crutches, cane, wheelchairs, walkers, the use of special transportation or the assistance of another person to leave their place of residence. There is a normal inability to leave the home and doing so requires considerable and taxing effort. Other absences are for medical reasons / religious services and are infrequent or of short duration when for other reasons). If current dressing causes regression in wound condition, may D/C ordered dressing product/s and apply Normal Saline Moist Dressing daily until next [place] / Other MD appointment. [place] of regression in wound condition at [phone]. Please direct any NON-WOUND related issues/requests for orders to patient's Primary Care Physician Wound #2 Right,Medial Lower Leg: [place] Visits - [facility]: HHRN to order velcro compression garments bilaterally Home Health Nurse may visit PRN to address patient s wound care needs. [name], HISSONG ([id number]) FACE TO FACE ENCOUNTER: MEDICARE and MEDICAID PATIENTS: I certify that this patient is under my care and that I had a face-to-face encounter that meets the physician face-to-face encounter requirements with this patient on this date. The encounter with the patient was in  whole or in part for the following MEDICAL CONDITION: (primary reason for [place]) MEDICAL NECESSITY: I certify, that based on my findings, NURSING services are a medically necessary home health service. HOME BOUND STATUS: I certify that my clinical findings support that this patient is homebound (i.e., Due to illness or injury, pt requires aid of supportive devices such as crutches, cane, wheelchairs, walkers, the use of special transportation or the assistance of another person to leave their place of residence. There is a normal inability to leave the home and doing so requires considerable and taxing effort. Other absences are for medical reasons / religious services and are infrequent or of short duration when for other reasons). If current dressing causes regression in wound condition, may D/C ordered dressing product/s and apply Normal Saline Moist Dressing daily until next [place] / Other MD appointment. [place] of regression in wound condition at [phone]. Please direct any NON-WOUND related issues/requests for orders to patient's Primary Care Physician After  review I have recommended: 1. 3 layer Profore with silver alginate on the right lower extremity. 2. Antifungal ointment, ABDs pad and a Kerlix and Coban on the left lower extremity 3. Arterial duplex studies to be scheduled -- appointment pending 4. Venous reflux studies to be scheduled -- appointment pending 5. We will order him some compression stockings through [facility] home health nurses so that one sees healed completely he will have some compression going forward. 6. Regular visits to the wound center Electronic Signature(s) Signed: 07/29/2017 3:25:59 PM By: Christin Fudge MD, FACS Entered By: Christin Fudge on 07/29/2017 15:25:59 Prude, [name] ([id number]) -------------------------------------------------------------------------------- SuperBill Details Patient Name: [name] Date of Service: 07/29/2017 Medical Record Number: [id number] Patient Account Number: [id number] Date of Birth/Sex: 10/19/1940 (76 y.o. 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 limited to breakdown of skin I73.9 Peripheral vascular disease, unspecified Facility Procedures CPT4 Code Description: [id number] (Facility Use Only) [phone] - APPLY MULTLAY COMPRS LWR RT LEG Modifier: Quantity: 1 Physician Procedures CPT4 Code Description: [id number] - WC PHYS LEVEL 3 - EST PT ICD-10 Diagnosis Description I89.0 Lymphedema, not elsewhere classified L97.812 Non-pressure chronic ulcer of other part of right lower leg wit L97.521 Non-pressure chronic ulcer of other  part of left foot limited t I73.9 Peripheral vascular disease, unspecified Modifier: h fat layer expos o breakdown of sk Quantity: 1 ed in Electronic Signature(s) Signed: 07/29/2017 3:26:12 PM By: Christin Fudge MD, FACS Entered By: Christin Fudge on 07/29/2017 15:26:11

## 2017-08-01 NOTE — Telephone Encounter (Signed)
Called and left a message for the cancer center to return my call.

## 2017-08-01 NOTE — Telephone Encounter (Signed)
Perfect!  Thank you so much.

## 2017-08-01 NOTE — Telephone Encounter (Signed)
FYI Dr Johnson

## 2017-08-01 NOTE — Telephone Encounter (Signed)
Another note made, please see that phone encounter.

## 2017-08-01 NOTE — Progress Notes (Signed)
[name] [name] ([id number]) Visit Report for 07/29/2017 Arrival Information Details Patient Name: [name], [name] [name]. Date of Service: 07/29/2017 2:45 PM Medical Record Number: [id number] Patient Account Number: [id number] Date of Birth/Sex: Mar 02, 1941 (76 y.o. Male) Treating RN: Cornell Barman Primary Care Mertie Haslem: Park Liter Other Clinician: Referring Chasyn Cinque: Park Liter Treating Noemy Hallmon/Extender: Frann Rider in Treatment: 3 Visit Information History Since Last Visit Added or deleted any medications: No Patient Arrived: [name] [name] Any new allergies or adverse reactions: No Arrival Time: 14:56 Had a fall or experienced change in No Accompanied By: son activities of daily living that may affect Transfer Assistance: None risk of falls: Patient Identification Verified: Yes Signs or symptoms of abuse/neglect since last visito No Secondary Verification Process Yes Hospitalized since last visit: No Completed: Has Dressing in Place as Prescribed: Yes Patient Has Alerts: Yes Pain Present Now: No Patient Alerts: Patient on Blood Thinner Aspirin 81 Mg Electronic Signature(s) Signed: 07/29/2017 4:13:04 PM By: Gretta Cool, BSN, RN, CWS, Kim RN, BSN Entered By: Gretta Cool, BSN, RN, CWS, Kim on 07/29/2017 14:56:23 [name] [name] ([id number]) -------------------------------------------------------------------------------- Compression Therapy Details Patient Name: [name] [name] [name]. Date of Service: 07/29/2017 2:45 PM Medical Record Number: [id number] Patient Account Number: [id number] Date of Birth/Sex: 10-04-1941 (76 y.o. Male) Treating RN: Cornell Barman Primary Care Londen Lorge: Park Liter Other Clinician: Referring Kerrie Latour: Park Liter Treating Uchenna Rappaport/Extender: Frann Rider in Treatment: 3 Compression Therapy Performed for Wound Assessment: Wound #2 Right,Medial Lower Leg Performed By: Clinician Cornell Barman, RN Compression Type: Three Layer Pre Treatment ABI: 1.1 Post  Procedure Diagnosis Same as Pre-procedure Electronic Signature(s) Signed: 07/29/2017 4:13:04 PM By: Gretta Cool, BSN, RN, CWS, Kim RN, BSN Entered By: Gretta Cool, BSN, RN, CWS, Kim on 07/29/2017 15:20:51 [name] [name] ([id number]) -------------------------------------------------------------------------------- Encounter Discharge Information Details Patient Name: [name] [name]. Date of Service: 07/29/2017 2:45 PM Medical Record Number: [id number] Patient Account Number: [id number] Date of Birth/Sex: 1941-03-18 (76 y.o. Male) Treating RN: Cornell Barman Primary Care Shatira Dobosz: Park Liter Other Clinician: Referring Josalin Carneiro: Park Liter Treating Markell Sciascia/Extender: Frann Rider in Treatment: 3 Encounter Discharge Information Items Discharge Pain Level: 0 Discharge Condition: Stable Ambulatory Status: Walker Discharge Destination: Home Transportation: Private Auto Accompanied By: son Schedule Follow-up Appointment: Yes Medication Reconciliation completed and Yes provided to Patient/Care Sheralee Qazi: Provided on Clinical Summary of Care: 07/29/2017 Form Type Recipient Paper Patient [facility] Electronic Signature(s) Signed: 07/29/2017 4:13:04 PM By: Gretta Cool, BSN, RN, CWS, Kim RN, BSN Entered By: Gretta Cool, BSN, RN, CWS, Kim on 07/29/2017 15:40:10 [name] [name] ([id number]) -------------------------------------------------------------------------------- Lower Extremity Assessment Details Patient Name: [name], [name] [name]. Date of Service: 07/29/2017 2:45 PM Medical Record Number: [id number] Patient Account Number: [id number] Date of Birth/Sex: 29-Aug-1941 (76 y.o. Male) Treating RN: Cornell Barman Primary Care Avin Upperman: Park Liter Other Clinician: Referring Hanish Laraia: Park Liter Treating Tomesha Sargent/Extender: Frann Rider in Treatment: 3 Edema Assessment Assessed: [Left: No] [Right: No] [Left: Edema] [Right: :] Calf Left: Right: Point of Measurement: 32 cm From Medial Instep 42 cm 42  cm Ankle Left: Right: Point of Measurement: 10 cm From Medial Instep 26.2 cm 25.8 cm Vascular Assessment Pulses: Dorsalis Pedis Palpable: [Left:Yes] [Right:Yes] Posterior Tibial Extremity colors, hair growth, and conditions: Extremity Color: [Left:Normal] [Right:Normal] Hair Growth on Extremity: [Left:No] [Right:No] Temperature of Extremity: [Left:Warm] [Right:Warm] Capillary Refill: [Left:< 3 seconds] [Right:< 3 seconds] Toe Nail Assessment Left: Right: Thick: Yes Yes Discolored: Yes Yes Deformed: Yes Yes Improper Length and Hygiene: Yes Electronic Signature(s) Signed: 07/29/2017 4:13:04 PM By: Gretta Cool, BSN, RN, CWS, Kim RN, BSN Entered By: Gretta Cool, BSN,  RN, CWS, Kim on 07/29/2017 15:06:19 [name] [name] ([id number]) -------------------------------------------------------------------------------- Multi Wound Chart Details Patient Name: [name], [name] [name]. Date of Service: 07/29/2017 2:45 PM Medical Record Number: [id number] Patient Account Number: [id number] Date of Birth/Sex: Jun 12, 1941 (76 y.o. Male) Treating RN: Cornell Barman Primary Care Kellen Hover: Park Liter Other Clinician: Referring Neda Willenbring: Park Liter Treating Kyen Taite/Extender: Frann Rider in Treatment: 3 Vital Signs Height(in): Pulse(bpm): 58 Weight(lbs): Blood Pressure(mmHg): 132/40 Body Mass Index(BMI): Temperature(F): Respiratory Rate 16 (breaths/min): Photos: [1:No Photos] [2:No Photos] [N/A:N/A] Wound Location: [1:Left, Distal, Dorsal Foot] [2:Right, Medial Lower Leg] [N/A:N/A] Wounding Event: [1:Gradually Appeared] [2:Gradually Appeared] [N/A:N/A] Primary Etiology: [1:Diabetic Wound/Ulcer of the Lower Extremity] [2:Diabetic Wound/Ulcer of the Lower Extremity] [N/A:N/A] 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] [2:Lymphedema] [N/A:N/A] Date Acquired: [1:07/08/2017] [2:05/11/2017] [N/A:N/A] Weeks of Treatment: [1:3] [2:3] [N/A:N/A] Wound Status: [1:Open] [2:Open] [N/A:N/A] Measurements L x W x D  [1:0.1x0.1x0.1] [2:0.1x0.1x0.1] [N/A:N/A] (cm) Area (cm) : [1:0.008] [2:0.008] [N/A:N/A] Volume (cm) : [1:0.001] [2:0.001] [N/A:N/A] % Reduction in Area: [1:100.00%] [2:99.90%] [N/A:N/A] % Reduction in Volume: [1:100.00%] [2:99.90%] [N/A:N/A] Classification: [1:N/A] [2:Grade 2] [N/A:N/A] Periwound Skin Texture: [1:No Abnormalities Noted] [2:No Abnormalities Noted] [N/A:N/A] Periwound Skin Moisture: [1:No Abnormalities Noted] [2:No Abnormalities Noted] [N/A:N/A] Periwound Skin Color: [1:No Abnormalities Noted] [2:No Abnormalities Noted] [N/A:N/A] Tenderness on Palpation: [1:No N/A] [2:No Compression Therapy] [N/A:N/A N/A] Treatment Notes Wound #1 (Left, Distal, Dorsal Foot) 1. Cleansed with: Clean wound with Normal Saline 3. Peri-wound Care: Moisturizing lotion 7. Secured with Tape 3 Layer Compression System - Right Lower Extremity Notes Kerlix and coban left Wound #2 (Right, Medial Lower Leg) [name], [name] [name]. ([id number]) 1. Cleansed with: Clean wound with Normal Saline 3. Peri-wound Care: Moisturizing lotion 7. Secured with Tape 3 Layer Compression System - Right Lower Extremity Notes Kerlix and coban left Electronic Signature(s) Signed: 07/29/2017 3:23:32 PM By: Christin Fudge MD, FACS Entered By: Christin Fudge on 07/29/2017 15:23:32 [place], [name] [name] ([id number]) -------------------------------------------------------------------------------- [place] Details Patient Name: [name], [name] [name]. Date of Service: 07/29/2017 2:45 PM Medical Record Number: [id number] Patient Account Number: [id number] Date of Birth/Sex: 10-Feb-1941 (76 y.o. Male) Treating RN: Cornell Barman Primary Care Jersee Winiarski: Park Liter Other Clinician: Referring Paulla Mcclaskey: Park Liter Treating Delaine Canter/Extender: Frann Rider in Treatment: 3 Active Inactive ` Abuse / Safety / Falls / Self Care Management Nursing Diagnoses: History of Falls Potential for  falls Goals: Patient will not experience any injury related to falls Date Initiated: 07/08/2017 Target Resolution Date: 07/15/2017 Goal Status: Active Patient will remain injury free related to falls Date Initiated: 07/08/2017 Target Resolution Date: 07/15/2017 Goal Status: Active Interventions: Assess fall risk on admission and as needed Treatment Activities: Patient referred to home care : 07/08/2017 Notes: ` Orientation to the Wound Care Program Nursing Diagnoses: Knowledge deficit related to the wound healing center program Goals: Patient/caregiver will verbalize understanding of the [place] Program Date Initiated: 07/08/2017 Target Resolution Date: 07/15/2017 Goal Status: Active Interventions: Provide education on orientation to the wound center Notes: ` Soft Tissue Infection Nursing Diagnoses: Impaired tissue integrity Lenahan, Yaroslav [name]. ([id number]) Potential for infection: soft tissue Goals: Patient/caregiver will verbalize understanding of or measures to prevent infection and contamination in the home setting Date Initiated: 07/08/2017 Target Resolution Date: 07/15/2017 Goal Status: Active Patient's soft tissue infection will resolve Date Initiated: 07/08/2017 Target Resolution Date: 07/15/2017 Goal Status: Active Interventions: Assess signs and symptoms of infection every visit Treatment Activities: Culture and sensitivity : 07/08/2017 Notes: ` Venous Leg Ulcer Nursing Diagnoses: Potential for venous Insuffiency (use before diagnosis confirmed) Goals: Non-invasive venous studies  are completed as ordered Date Initiated: 07/08/2017 Target Resolution Date: 07/15/2017 Goal Status: Active Patient will maintain optimal edema control Date Initiated: 07/08/2017 Target Resolution Date: 07/15/2017 Goal Status: Active Interventions: Assess peripheral edema status every visit. Provide education on venous insufficiency Treatment Activities: Non-invasive vascular  studies : 07/08/2017 Venous Duplex Doppler : 07/09/2017 Notes: ` Wound/Skin Impairment Nursing Diagnoses: Impaired tissue integrity Goals: Patient/caregiver will verbalize understanding of skin care regimen Date Initiated: 07/08/2017 Target Resolution Date: 07/15/2017 Goal Status: Active Interventions: HEATH, TESLER ([id number]) Assess ulceration(s) every visit Treatment Activities: Patient referred to home care : 07/08/2017 Topical wound management initiated : 07/08/2017 Notes: Electronic Signature(s) Signed: 07/29/2017 4:13:04 PM By: Gretta Cool, BSN, RN, CWS, Kim RN, BSN Entered By: Gretta Cool, BSN, RN, CWS, Kim on 07/29/2017 15:16:19 [place], [name] [name] ([id number]) -------------------------------------------------------------------------------- Pain Assessment Details Patient Name: Janine Ores [name]. Date of Service: 07/29/2017 2:45 PM Medical Record Number: [id number] Patient Account Number: [id number] Date of Birth/Sex: 1940/10/27 (76 y.o. Male) Treating RN: Cornell Barman Primary Care Melquan Ernsberger: Park Liter Other Clinician: Referring Adalee Kathan: Park Liter Treating Margert Edsall/Extender: Frann Rider in Treatment: 3 Active Problems Location of Pain Severity and Description of Pain Patient Has Paino No Site Locations With Dressing Change: No Pain Management and Medication Current Pain Management: Goals for Pain Management Topical or injectable lidocaine is offered to patient for acute pain when surgical debridement is performed. If needed, Patient is instructed to use over the counter pain medication for the following 24-48 hours after debridement. Wound care MDs do not prescribed pain medications. Patient has chronic pain or uncontrolled pain. Patient has been instructed to make an appointment with their Primary Care Physician for pain management. Electronic Signature(s) Signed: 07/29/2017 4:13:04 PM By: Gretta Cool, BSN, RN, CWS, Kim RN, BSN Entered By: Gretta Cool, BSN, RN, CWS, Kim on  07/29/2017 14:56:32 [place], [name] [name] ([id number]) -------------------------------------------------------------------------------- Patient/Caregiver Education Details Patient Name: [name] [name] Date of Service: 07/29/2017 2:45 PM Medical Record Number: [id number] Patient Account Number: [id number] Date of Birth/Gender: 1941/08/31 (76 y.o. Male) Treating RN: Cornell Barman Primary Care Physician: Park Liter Other Clinician: Referring Physician: Park Liter Treating Physician/Extender: Frann Rider in Treatment: 3 Education Assessment Education Provided To: Patient Education Topics Provided Wound/Skin Impairment: Handouts: Caring for Your Ulcer Methods: Demonstration Responses: State content correctly Electronic Signature(s) Signed: 07/29/2017 4:13:04 PM By: Gretta Cool, BSN, RN, CWS, Kim RN, BSN Entered By: Gretta Cool, BSN, RN, CWS, Kim on 07/29/2017 15:40:19 [place], [name] [name] ([id number]) -------------------------------------------------------------------------------- Wound Assessment Details Patient Name: [name], [name] [name]. Date of Service: 07/29/2017 2:45 PM Medical Record Number: [id number] Patient Account Number: [id number] Date of Birth/Sex: 03-27-1941 (76 y.o. Male) Treating RN: Cornell Barman Primary Care Novice Vrba: Park Liter Other Clinician: Referring Shantavia Jha: Park Liter Treating Avalon Coppinger/Extender: Frann Rider in Treatment: 3 Wound Status Wound Number: 1 Primary Etiology: Diabetic Wound/Ulcer of the Lower Extremity Wound Location: Left, Distal, Dorsal Foot Secondary Fungal Wounding Event: Gradually Appeared Etiology: Date Acquired: 07/08/2017 Wound Status: Open Weeks Of Treatment: 3 Clustered Wound: No Wound Measurements Length: (cm) 0.1 Width: (cm) 0.1 Depth: (cm) 0.1 Area: (cm) 0.008 Volume: (cm) 0.001 % Reduction in Area: 100% % Reduction in Volume: 100% Periwound Skin Texture Texture Color No Abnormalities Noted: No No Abnormalities  Noted: No Moisture No Abnormalities Noted: No Treatment Notes Wound #1 (Left, Distal, Dorsal Foot) 1. Cleansed with: Clean wound with Normal Saline 3. Peri-wound Care: Moisturizing lotion 7. Secured with Tape 3 Layer Compression System - Right Lower Extremity Notes Kerlix and coban left Electronic Signature(s) Signed: 07/29/2017 4:13:04 PM  By: Gretta Cool, BSN, RN, CWS, Kim RN, BSN Entered By: Gretta Cool, BSN, RN, CWS, Kim on 07/29/2017 [place], [name] [name] ([id number]) -------------------------------------------------------------------------------- Wound Assessment Details Patient Name: [name], [name] [name]. Date of Service: 07/29/2017 2:45 PM Medical Record Number: [id number] Patient Account Number: [id number] Date of Birth/Sex: January 12, 1941 (76 y.o. Male) Treating RN: Cornell Barman Primary Care Boots Mcglown: Park Liter Other Clinician: Referring Shardai Star: Park Liter Treating Tamirra Sienkiewicz/Extender: Frann Rider in Treatment: 3 Wound Status Wound Number: 2 Primary Etiology: Diabetic Wound/Ulcer of the Lower Extremity Wound Location: Right, Medial Lower Leg Secondary Lymphedema Wounding Event: Gradually Appeared Etiology: Date Acquired: 05/11/2017 Wound Status: Open Weeks Of Treatment: 3 Clustered Wound: No Wound Measurements Length: (cm) 0.1 Width: (cm) 0.1 Depth: (cm) 0.1 Area: (cm) 0.008 Volume: (cm) 0.001 % Reduction in Area: 99.9% % Reduction in Volume: 99.9% Wound Description Classification: Grade 2 Periwound Skin Texture Texture Color No Abnormalities Noted: No No Abnormalities Noted: No Moisture No Abnormalities Noted: No Treatment Notes Wound #2 (Right, Medial Lower Leg) 1. Cleansed with: Clean wound with Normal Saline 3. Peri-wound Care: Moisturizing lotion 7. Secured with Tape 3 Layer Compression System - Right Lower Extremity Notes Kerlix and coban left Electronic Signature(s) Signed: 07/29/2017 4:13:04 PM By: Gretta Cool, BSN, RN, CWS, Kim RN,  BSN Entered By: Gretta Cool, BSN, RN, CWS, Kim on 07/29/2017 15:16:10 [place], [name] [name] ([id number]) -------------------------------------------------------------------------------- Vitals Details Patient Name: [name] [name]. Date of Service: 07/29/2017 2:45 PM Medical Record Number: [id number] Patient Account Number: [id number] Date of Birth/Sex: May 20, 1941 (76 y.o. Male) Treating RN: Cornell Barman Primary Care Savahanna Almendariz: Park Liter Other Clinician: Referring Correna Meacham: Park Liter Treating Kamel Haven/Extender: Frann Rider in Treatment: 3 Vital Signs Time Taken: 14:56 Pulse (bpm): 77 Respiratory Rate (breaths/min): 16 Blood Pressure (mmHg): 132/40 Reference Range: 80 - 120 mg / dl Electronic Signature(s) Signed: 07/29/2017 4:13:04 PM By: Gretta Cool, BSN, RN, CWS, Kim RN, BSN Entered By: Gretta Cool, BSN, RN, CWS, Kim on 07/29/2017 14:56:53

## 2017-08-01 NOTE — Telephone Encounter (Signed)
Message forwarded to scheduling. Will inform Tiffany

## 2017-08-01 NOTE — Telephone Encounter (Signed)
PCP called asking for patient to have an appointment due to labs dropping was seen in Feb and does not come back until Feb 2019.  Patient notified of results by phone   Ref Range & Units 3d ago  WBC 3.4 - 10.8 x10E3/uL 3.1    RBC 4.14 - 5.80 x10E6/uL 1.80    Hemoglobin 13.0 - 17.7 g/dL 7.2    Hematocrit 37.5 - 51.0 % 20.3    MCV 79 - 97 fL 113    MCH 26.6 - 33.0 pg 40.0    MCHC 31.5 - 35.7 g/dL 35.5   RDW 12.3 - 15.4 % 14.3   Platelets 150 - 379 x10E3/uL 87    Neutrophils Not Estab. % 71   Comment: Toxic granulation of polys.  Lymphs Not Estab. % 18   Monocytes Not Estab. % 8   Eos Not Estab. % 2   Basos Not Estab. % 1   Neutrophils Absolute 1.4 - 7.0 x10E3/uL 2.2   Lymphocytes Absolute 0.7 - 3.1 x10E3/uL 0.6    Monocytes Absolute 0.1 - 0.9 x10E3/uL 0.2   EOS (ABSOLUTE) 0.0 - 0.4 x10E3/uL 0.1   Basophils Absolute 0.0 - 0.2 x10E3/uL 0.0   Immature Granulocytes Not Estab. % 0   Immature Grans (Abs) 0.0 - 0.1 x10E3/uL 0.0   Hematology Comments:  Note:   Comment: Verified by microscopic examination.

## 2017-08-01 NOTE — Telephone Encounter (Signed)
I can see him in the next 1-2 weeks with laboratory work.

## 2017-08-03 ENCOUNTER — Other Ambulatory Visit (INDEPENDENT_AMBULATORY_CARE_PROVIDER_SITE_OTHER): Payer: Self-pay | Admitting: Vascular Surgery

## 2017-08-03 ENCOUNTER — Other Ambulatory Visit: Payer: Self-pay | Admitting: Physician Assistant

## 2017-08-03 ENCOUNTER — Other Ambulatory Visit (INDEPENDENT_AMBULATORY_CARE_PROVIDER_SITE_OTHER): Payer: Medicare Other

## 2017-08-03 DIAGNOSIS — M7989 Other specified soft tissue disorders: Secondary | ICD-10-CM

## 2017-08-05 ENCOUNTER — Encounter: Payer: Medicare Other | Admitting: Physician Assistant

## 2017-08-05 DIAGNOSIS — I739 Peripheral vascular disease, unspecified: Secondary | ICD-10-CM | POA: Diagnosis not present

## 2017-08-05 DIAGNOSIS — N181 Chronic kidney disease, stage 1: Secondary | ICD-10-CM | POA: Diagnosis not present

## 2017-08-05 DIAGNOSIS — L97812 Non-pressure chronic ulcer of other part of right lower leg with fat layer exposed: Secondary | ICD-10-CM | POA: Diagnosis not present

## 2017-08-05 DIAGNOSIS — E1122 Type 2 diabetes mellitus with diabetic chronic kidney disease: Secondary | ICD-10-CM | POA: Diagnosis not present

## 2017-08-05 DIAGNOSIS — I129 Hypertensive chronic kidney disease with stage 1 through stage 4 chronic kidney disease, or unspecified chronic kidney disease: Secondary | ICD-10-CM | POA: Diagnosis not present

## 2017-08-05 DIAGNOSIS — E1142 Type 2 diabetes mellitus with diabetic polyneuropathy: Secondary | ICD-10-CM | POA: Diagnosis not present

## 2017-08-05 DIAGNOSIS — L97529 Non-pressure chronic ulcer of other part of left foot with unspecified severity: Secondary | ICD-10-CM | POA: Diagnosis not present

## 2017-08-05 DIAGNOSIS — L97512 Non-pressure chronic ulcer of other part of right foot with fat layer exposed: Secondary | ICD-10-CM | POA: Diagnosis not present

## 2017-08-05 DIAGNOSIS — L03115 Cellulitis of right lower limb: Secondary | ICD-10-CM | POA: Diagnosis not present

## 2017-08-05 DIAGNOSIS — I890 Lymphedema, not elsewhere classified: Secondary | ICD-10-CM | POA: Diagnosis not present

## 2017-08-05 DIAGNOSIS — L97819 Non-pressure chronic ulcer of other part of right lower leg with unspecified severity: Secondary | ICD-10-CM | POA: Diagnosis not present

## 2017-08-05 NOTE — Progress Notes (Deleted)
[name]  [phone] [phone]  ID: CHUONG CASEBEER OB: 1941-09-15  MR#: [id number]  QIO#:[id number]  Patient Care Team: Valerie Roys, DO as PCP - General (Family Medicine)  CHIEF COMPLAINT: Pancytopenia  INTERVAL HISTORY: Patient returns to clinic today for repeat laboratory work and further evaluation. He continues to feel well and is asymptomatic. He denies any fevers or illnesses. He denies any easy bleeding or bruising. He denies any weakness or fatigue. He denies any chest pain or shortness of breath. He has no nausea, vomiting, constipation, or diarrhea. He has no urinary complaints. Patient feels at his baseline and offers no specific complaints today.  REVIEW OF SYSTEMS:   Review of Systems  Constitutional: Negative.  Negative for fever and malaise/fatigue.  Respiratory: Negative.  Negative for cough and shortness of breath.   Cardiovascular: Negative.  Negative for chest pain and leg swelling.  Gastrointestinal: Negative.  Negative for abdominal pain, blood in stool and melena.  Musculoskeletal: Negative.   Neurological: Negative.  Negative for sensory change and weakness.  Endo/Heme/Allergies: Does not bruise/bleed easily.  Psychiatric/Behavioral: Positive for substance abuse. The patient is not nervous/anxious.     As per HPI. Otherwise, a complete review of systems is negative.  PAST MEDICAL HISTORY: Past Medical History:  Diagnosis Date  . Diabetes mellitus   . 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 Hypertension   . Hypothyroidism   . Neuropathy   . Venous stasis     PAST SURGICAL HISTORY: Past Surgical History:  Procedure Laterality Date  . CATARACT EXTRACTION W/PHACO Right 10/25/2016   Procedure: CATARACT EXTRACTION PHACO AND INTRAOCULAR LENS PLACEMENT (IOC)  right eye complicated;  Surgeon: Ronnell Freshwater, MD;  Location: [facility];  Service: Ophthalmology;   Laterality: Right;  Right eye Diabetic - oral meds Vision blue  . FINGER SURGERY      FAMILY HISTORY Family History  Problem Relation Age of Onset  . Heart disease Mother   . Alzheimer's disease Sister   . Heart disease Maternal Aunt        ADVANCED DIRECTIVES:    HEALTH MAINTENANCE: Social History  Substance Use Topics  . Smoking status: Former Smoker    Packs/day: 0.25    Types: Cigarettes    Quit date: 03/22/2016  . Smokeless tobacco: Current User    Types: Chew  . Alcohol use 25.2 oz/week    42 Cans of beer per week     Comment: 5-6 every day     Colonoscopy:  PAP:  Bone density:  Lipid panel:  Allergies  Allergen Reactions  . Metformin And Related Nausea And Vomiting    Current Outpatient Prescriptions  Medication Sig Dispense Refill  . aspirin EC 81 MG tablet Take 81 mg by mouth daily.    Marland Kitchen levothyroxine (SYNTHROID, LEVOTHROID) 75 MCG tablet Take 1 tablet (75 mcg total) by mouth daily before breakfast. 90 tablet 3  . lisinopril (PRINIVIL,ZESTRIL) 5 MG tablet Take 1 tablet (5 mg total) by mouth daily. 90 tablet 1   No current facility-administered medications for this visit.     OBJECTIVE: There were no vitals filed for this visit.   There is no height or weight on file to calculate BMI.    ECOG FS:0 - Asymptomatic  General: Well-developed, well-nourished, no acute distress. Eyes: Pink conjunctiva, anicteric sclera. Lungs: Clear to auscultation bilaterally. Heart: Regular rate and rhythm. No rubs, murmurs, or gallops. Abdomen:  Soft, nontender, nondistended. No organomegaly noted, normoactive bowel sounds. Musculoskeletal: No edema, cyanosis, or clubbing. Neuro: Alert, answering all questions appropriately. Cranial nerves grossly intact. Skin: No rashes or petechiae noted. Psych: Normal affect.   LAB RESULTS:  Lab Results  Component Value Date   NA 135 07/29/2017   K 4.8 07/29/2017   CL 102 07/29/2017   CO2 20 07/29/2017   GLUCOSE 126 (H)  07/29/2017   BUN 13 07/29/2017   CREATININE 1.30 (H) 07/29/2017   CALCIUM 8.5 (L) 07/29/2017   PROT 4.9 (L) 07/29/2017   ALBUMIN 3.2 (L) 07/29/2017   AST 36 07/29/2017   ALT 25 07/29/2017   ALKPHOS 189 (H) 07/29/2017   BILITOT 0.6 07/29/2017   GFRNONAA 53 (L) 07/29/2017   GFRAA 61 07/29/2017    Lab Results  Component Value Date   WBC 3.1 (L) 07/29/2017   NEUTROABS 2.2 07/29/2017   HGB 7.2 (L) 07/29/2017   HCT 20.3 (L) 07/29/2017   MCV 113 (H) 07/29/2017   PLT 87 (LL) 07/29/2017     STUDIES: No results found.  ASSESSMENT:  Pancytopenia.   PLAN:    1.  Leukopenia: Patient's white blood cell count is mildly decreased, but unchanged. Peripheral blood flow cytometry is within normal limits.  All of his other labs were either negative or within normal limits. Suspect leukopenia may be secondary to mild bone marrow suppression from patient's persistent alcohol use. No intervention is needed at this time. Patient does not require bone marrow biopsy. Will consider abdominal ultrasound in the future to assess for splenomegaly. Patient has requested less frequent follow-up, therefore will return to clinic in 6 months for laboratory work and then in one year for laboratory work and further evaluation. If patient's laboratory work remains stable at that time, he likely can be discharged from clinic. 2. Anemia: Mild. Given patient's elevated MCV, patient has a mildly decreased B-12 level, he may benefit from oral B-12 supplementation. Iron stores and folate are within normal limits. 3. Thrombocytopenia: Mild., likely secondary to alcohol use. Monitor.  Patient expressed understanding and was in agreement with this plan. He also understands that He can call clinic at any time with any questions, concerns, or complaints.    Lloyd Huger, MD   08/05/2017 4:46 PM

## 2017-08-08 ENCOUNTER — Other Ambulatory Visit: Payer: Self-pay | Admitting: Family Medicine

## 2017-08-08 ENCOUNTER — Other Ambulatory Visit (INDEPENDENT_AMBULATORY_CARE_PROVIDER_SITE_OTHER): Payer: Self-pay | Admitting: Vascular Surgery

## 2017-08-08 ENCOUNTER — Other Ambulatory Visit: Payer: Self-pay | Admitting: *Deleted

## 2017-08-08 ENCOUNTER — Encounter (INDEPENDENT_AMBULATORY_CARE_PROVIDER_SITE_OTHER): Payer: Self-pay | Admitting: Vascular Surgery

## 2017-08-08 ENCOUNTER — Encounter (INDEPENDENT_AMBULATORY_CARE_PROVIDER_SITE_OTHER): Payer: Self-pay

## 2017-08-08 ENCOUNTER — Telehealth: Payer: Self-pay | Admitting: Family Medicine

## 2017-08-08 ENCOUNTER — Ambulatory Visit (INDEPENDENT_AMBULATORY_CARE_PROVIDER_SITE_OTHER): Payer: Medicare Other | Admitting: Vascular Surgery

## 2017-08-08 VITALS — BP 130/51 | HR 94 | Resp 16 | Wt 184.0 lb

## 2017-08-08 DIAGNOSIS — E1165 Type 2 diabetes mellitus with hyperglycemia: Secondary | ICD-10-CM | POA: Diagnosis not present

## 2017-08-08 DIAGNOSIS — D61818 Other pancytopenia: Secondary | ICD-10-CM

## 2017-08-08 DIAGNOSIS — E1140 Type 2 diabetes mellitus with diabetic neuropathy, unspecified: Secondary | ICD-10-CM

## 2017-08-08 DIAGNOSIS — IMO0002 Reserved for concepts with insufficient information to code with codable children: Secondary | ICD-10-CM

## 2017-08-08 DIAGNOSIS — I739 Peripheral vascular disease, unspecified: Secondary | ICD-10-CM | POA: Diagnosis not present

## 2017-08-08 DIAGNOSIS — I7025 Atherosclerosis of native arteries of other extremities with ulceration: Secondary | ICD-10-CM | POA: Insufficient documentation

## 2017-08-08 MED ORDER — CEFAZOLIN SODIUM-DEXTROSE 2-4 GM/100ML-% IV SOLN: 2.0000 g | Freq: Once | INTRAVENOUS | Status: DC

## 2017-08-08 NOTE — Progress Notes (Signed)
DEONTA, BOMBERGER ([id number]) Visit Report for 08/05/2017 Arrival Information Details Patient Name: Martin [name] [name]. Date of Service: 08/05/2017 11:00 AM Medical Record Number: [id number] Patient Account Number: [id number] Date of Birth/Sex: 23-May-1941 (76 [name].o. Male) Treating RN: Cornell Barman Primary Care Hallie Ishida: Park Liter Other Clinician: Referring Martice Doty: Park Liter Treating Tametra Ahart/Extender: Melburn Hake, HOYT Weeks in Treatment: 4 Visit Information History Since Last Visit Added or deleted any medications: No Patient Arrived: Kasandra Knudsen Any new allergies or adverse reactions: No Arrival Time: 11:20 Had a fall or experienced change in No Accompanied By: son activities of daily living that may affect Transfer Assistance: None risk of falls: Patient Identification Verified: Yes Signs or symptoms of abuse/neglect since last visito No Secondary Verification Process Yes Hospitalized since last visit: No Completed: Has Dressing in Place as Prescribed: Yes Patient Has Alerts: Yes Pain Present Now: No Patient Alerts: Patient on Blood Thinner Aspirin 81 Mg Electronic Signature(s) Signed: 08/05/2017 1:25:03 PM By: Gretta Cool, BSN, RN, CWS, Kim RN, BSN Entered By: Gretta Cool, BSN, RN, CWS, Kim on 08/05/2017 11:20:53 [name] ([id number]) -------------------------------------------------------------------------------- Clinic Level of Care Assessment Details Patient Name: [name] C. Date of Service: 08/05/2017 11:00 AM Medical Record Number: [id number] Patient Account Number: [id number] Date of Birth/Sex: 1941-03-22 (76 [name].o. Male) Treating RN: Cornell Barman Primary Care Coree Riester: Park Liter Other Clinician: Referring Kirbi Farrugia: Park Liter Treating Areg Bialas/Extender: Melburn Hake, HOYT Weeks in Treatment: 4 Clinic Level of Care Assessment Items TOOL 4 Quantity Score []  - Use when only an EandM is performed on FOLLOW-UP visit 0 ASSESSMENTS - Nursing Assessment /  Reassessment []  - Reassessment of Co-morbidities (includes updates in patient status) 0 X- 1 5 Reassessment of Adherence to Treatment Plan ASSESSMENTS - Wound and Skin Assessment / Reassessment X - Simple Wound Assessment / Reassessment - one wound 1 5 []  - 0 Complex Wound Assessment / Reassessment - multiple wounds []  - 0 Dermatologic / Skin Assessment (not related to wound area) ASSESSMENTS - Focused Assessment []  - Circumferential Edema Measurements - multi extremities 0 []  - 0 Nutritional Assessment / Counseling / Intervention []  - 0 Lower Extremity Assessment (monofilament, tuning fork, pulses) []  - 0 Peripheral Arterial Disease Assessment (using hand held doppler) ASSESSMENTS - Ostomy and/or Continence Assessment and Care []  - Incontinence Assessment and Management 0 []  - 0 Ostomy Care Assessment and Management (repouching, etc.) PROCESS - Coordination of Care X - Simple Patient / Family Education for ongoing care 1 15 []  - 0 Complex (extensive) Patient / Family Education for ongoing care X- 1 10 Staff obtains Programmer, systems, Records, Test Results / Process Orders []  - 0 Staff telephones HHA, Nursing Homes / Clarify orders / etc []  - 0 Routine Transfer to another Facility (non-emergent condition) []  - 0 Routine Hospital Admission (non-emergent condition) []  - 0 New Admissions / Biomedical engineer / Ordering NPWT, Apligraf, etc. []  - 0 Emergency Hospital Admission (emergent condition) X- 1 10 Simple Discharge Coordination Eskelson, [name] []  - 0 Complex (extensive) Discharge Coordination PROCESS - Special Needs []  - Pediatric / Minor Patient Management 0 []  - 0 Isolation Patient Management []  - 0 Hearing / Language / Visual special needs []  - 0 Assessment of Community assistance (transportation, D/C planning, etc.) []  - 0 Additional assistance / Altered mentation []  - 0 Support Surface(s) Assessment (bed, cushion, seat, etc.) INTERVENTIONS -  Wound Cleansing / Measurement X - Simple Wound Cleansing - one wound 1 5 []  - 0 Complex Wound Cleansing - multiple wounds X- 1 5 Wound  Imaging (photographs - any number of wounds) []  - 0 Wound Tracing (instead of photographs) X- 1 5 Simple Wound Measurement - one wound []  - 0 Complex Wound Measurement - multiple wounds INTERVENTIONS - Wound Dressings []  - Small Wound Dressing one or multiple wounds 0 []  - 0 Medium Wound Dressing one or multiple wounds []  - 0 Large Wound Dressing one or multiple wounds []  - 0 Application of Medications - topical []  - 0 Application of Medications - injection INTERVENTIONS - Miscellaneous []  - External ear exam 0 []  - 0 Specimen Collection (cultures, biopsies, blood, body fluids, etc.) []  - 0 Specimen(s) / Culture(s) sent or taken to Lab for analysis []  - 0 Patient Transfer (multiple staff / Civil Service fast streamer / Similar devices) []  - 0 Simple Staple / Suture removal (25 or less) []  - 0 Complex Staple / Suture removal (26 or more) []  - 0 Hypo / Hyperglycemic Management (close monitor of Blood Glucose) []  - 0 Ankle / Brachial Index (ABI) - do not check if billed separately X- 1 5 Vital Signs [name] C. ([id number]) Has the patient been seen at the hospital within the last three years: Yes Total Score: 65 Level Of Care: New/Established - Level 2 Electronic Signature(s) Signed: 08/05/2017 1:25:03 PM By: Gretta Cool, BSN, RN, CWS, Kim RN, BSN Entered By: Gretta Cool, BSN, RN, CWS, Kim on 08/05/2017 11:59:11 [name] ([id number]) -------------------------------------------------------------------------------- Encounter Discharge Information Details Patient Name: [name] C. Date of Service: 08/05/2017 11:00 AM Medical Record Number: [id number] Patient Account Number: [id number] Date of Birth/Sex: 1941-09-16 (76 [name].o. Male) Treating RN: Cornell Barman Primary Care Brittie Whisnant: Park Liter Other Clinician: Referring Ember Gottwald: Park Liter Treating Willene Holian/Extender: Melburn Hake, HOYT Weeks in Treatment: 4 Encounter Discharge Information Items Discharge Pain Level: 0 Discharge Condition: Stable Ambulatory Status: Cane Discharge Destination: Home Private Transportation: Auto son, Accompanied ByKerry Dory Schedule Follow-up Appointment: No Medication Reconciliation completed and provided Yes to Patient/Care Omaira Mellen: Clinical Summary of Care: Electronic Signature(s) Signed: 08/05/2017 1:25:03 PM By: Gretta Cool, BSN, RN, CWS, Kim RN, BSN Entered By: Gretta Cool, BSN, RN, CWS, Kim on 08/05/2017 12:00:16 Taranto, [name] ([id number]) -------------------------------------------------------------------------------- Lower Extremity Assessment Details Patient Name: [name] C. Date of Service: 08/05/2017 11:00 AM Medical Record Number: [id number] Patient Account Number: [id number] Date of Birth/Sex: 1940/11/23 (76 [name].o. Male) Treating RN: Cornell Barman Primary Care Esias Mory: Park Liter Other Clinician: Referring Graesyn Schreifels: Park Liter Treating Marcellis Frampton/Extender: Melburn Hake, HOYT Weeks in Treatment: 4 Vascular Assessment Pulses: Dorsalis Pedis Palpable: [Left:Yes] [Right:Yes] Posterior Tibial Extremity colors, hair growth, and conditions: Extremity Color: [Left:Normal] [Right:Normal] Hair Growth on Extremity: [Left:No] [Right:No] Temperature of Extremity: [Left:Warm] [Right:Warm] Capillary Refill: [Left:< 3 seconds] [Right:< 3 seconds] Toe Nail Assessment Left: Right: Thick: Yes Yes Discolored: Yes Yes Deformed: Yes Yes Improper Length and Hygiene: Yes Yes Electronic Signature(s) Signed: 08/05/2017 1:25:03 PM By: Gretta Cool, BSN, RN, CWS, Kim RN, BSN Entered By: Gretta Cool, BSN, RN, CWS, Kim on 08/05/2017 11:32:37 Genis, [name] ([id number]) -------------------------------------------------------------------------------- Multi Wound Chart Details Patient Name: [name] Date of Service: 08/05/2017 11:00  AM Medical Record Number: [id number] Patient Account Number: [id number] Date of Birth/Sex: 1940/10/25 (76 [name].o. Male) Treating RN: Cornell Barman Primary Care Rebeckah Masih: Park Liter Other Clinician: Referring Shaylin Blatt: Park Liter Treating Dannette Kinkaid/Extender: Melburn Hake, HOYT Weeks in Treatment: 4 Vital Signs Height(in): Pulse(bpm): 75 Weight(lbs): Blood Pressure(mmHg): 129/39 Body Mass Index(BMI): Temperature(F): 98.0 Respiratory Rate 16 (breaths/min): Wound Assessments Treatment Notes Electronic Signature(s) Signed: 08/05/2017 1:25:03 PM By: Gretta Cool, BSN, RN, CWS, Kim RN, BSN Entered By:  Gretta Cool, BSN, RN, CWS, Kim on 08/05/2017 11:57:04 Booton, [name] ([id number]) -------------------------------------------------------------------------------- Multi-Disciplinary Care Plan Details Patient Name: [name], YSAGUIRRE. Date of Service: 08/05/2017 11:00 AM Medical Record Number: [id number] Patient Account Number: [id number] Date of Birth/Sex: [birth year] (76 [name].o. Male) Treating RN: Cornell Barman Primary Care Georgeann Brinkman: Park Liter Other Clinician: Referring Edman Lipsey: Park Liter Treating Saben Donigan/Extender: Worthy Keeler Weeks in Treatment: 4 Active Inactive Electronic Signature(s) Signed: 08/05/2017 1:25:03 PM By: Gretta Cool, BSN, RN, CWS, Kim RN, BSN Entered By: Gretta Cool, BSN, RN, CWS, Kim on 08/05/2017 11:56:46 Landry, [name] ([id number]) -------------------------------------------------------------------------------- Pain Assessment Details Patient Name: [name] C. Date of Service: 08/05/2017 11:00 AM Medical Record Number: [id number] Patient Account Number: [id number] Date of Birth/Sex: 01-Jul-1941 (76 [name].o. Male) Treating RN: Cornell Barman Primary Care Jester Klingberg: Park Liter Other Clinician: Referring Anessia Oakland: Park Liter Treating Shakeyla Giebler/Extender: Melburn Hake, HOYT Weeks in Treatment: 4 Active Problems Location of Pain Severity and Description of Pain Patient Has Paino  No Site Locations With Dressing Change: No Pain Management and Medication Current Pain Management: Goals for Pain Management Topical or injectable lidocaine is offered to patient for acute pain when surgical debridement is performed. If needed, Patient is instructed to use over the counter pain medication for the following 24-48 hours after debridement. Wound care MDs do not prescribed pain medications. Patient has chronic pain or uncontrolled pain. Patient has been instructed to make an appointment with their Primary Care Physician for pain management. Electronic Signature(s) Signed: 08/05/2017 1:25:03 PM By: Gretta Cool, BSN, RN, CWS, Kim RN, BSN Entered By: Gretta Cool, BSN, RN, CWS, Kim on 08/05/2017 11:21:04 [name] ([id number]) -------------------------------------------------------------------------------- Patient/Caregiver Education Details Patient Name: [name] Date of Service: 08/05/2017 11:00 AM Medical Record Number: [id number] Patient Account Number: [id number] Date of Birth/Gender: [birth year] (76 [name].o. Male) Treating RN: Cornell Barman Primary Care Physician: Park Liter Other Clinician: Referring Physician: Park Liter Treating Physician/Extender: Sharalyn Ink in Treatment: 4 Education Assessment Education Provided To: Patient Education Topics Provided Venous: Handouts: Controlling Swelling with Compression Stockings , Other: compression socks from [employer] Methods: Demonstration, Explain/Verbal Responses: State content correctly Electronic Signature(s) Signed: 08/05/2017 1:25:03 PM By: Gretta Cool, BSN, RN, CWS, Kim RN, BSN Entered By: Gretta Cool, BSN, RN, CWS, Kim on 08/05/2017 12:01:07 Suarez, [name] ([id number]) -------------------------------------------------------------------------------- Wound Assessment Details Patient Name: [name] C. Date of Service: 08/05/2017 11:00 AM Medical Record Number: [id number] Patient Account Number:  [id number] Date of Birth/Sex: 20-May-1941 (76 [name].o. Male) Treating RN: Cornell Barman Primary Care Erendira Crabtree: Park Liter Other Clinician: Referring Zacheriah Stumpe: Park Liter Treating Yarnell Kozloski/Extender: Melburn Hake, HOYT Weeks in Treatment: 4 Wound Status Wound Number: 1 Primary Etiology: Diabetic Wound/Ulcer of the Lower Extremity Wound Location: Left, Distal, Dorsal Foot Secondary Fungal Wounding Event: Gradually Appeared Etiology: Date Acquired: 07/08/2017 Wound Status: Healed - Epithelialized Weeks Of Treatment: 4 Clustered Wound: No Photos Photo Uploaded By: Gretta Cool, BSN, RN, CWS, Kim on 08/05/2017 16:25:00 Wound Measurements Length: (cm) 0 Width: (cm) 0 Depth: (cm) 0 Area: (cm) 0 Volume: (cm) 0 % Reduction in Area: 100% % Reduction in Volume: 100% Periwound Skin Texture Texture Color No Abnormalities Noted: No No Abnormalities Noted: No Moisture No Abnormalities Noted: No Electronic Signature(s) Signed: 08/05/2017 1:25:03 PM By: Gretta Cool, BSN, RN, CWS, Kim RN, BSN Entered By: Gretta Cool, BSN, RN, CWS, Kim on 08/05/2017 11:59:48 Idris, [name] ([id number]) -------------------------------------------------------------------------------- Wound Assessment Details Patient Name: [name] C. Date of Service: 08/05/2017 11:00 AM Medical Record Number: [id number] Patient Account Number: [id number] Date of Birth/Sex: 06/16/1941 (76 [name].o. Male) Treating  RN: Cornell Barman Primary Care Ramiyah Mcclenahan: Park Liter Other Clinician: Referring Kire Ferg: Park Liter Treating Hannibal Skalla/Extender: Melburn Hake, HOYT Weeks in Treatment: 4 Wound Status Wound Number: 2 Primary Etiology: Diabetic Wound/Ulcer of the Lower Extremity Wound Location: Right, Medial Lower Leg Secondary Lymphedema Wounding Event: Gradually Appeared Etiology: Date Acquired: 05/11/2017 Wound Status: Healed - Epithelialized Weeks Of Treatment: 4 Clustered Wound: No Photos Photo Uploaded By: Gretta Cool, BSN, RN, CWS, Kim on  08/05/2017 16:25:00 Wound Measurements Length: (cm) 0 Width: (cm) 0 Depth: (cm) 0 Area: (cm) 0 Volume: (cm) 0 % Reduction in Area: 100% % Reduction in Volume: 100% Wound Description Classification: Grade 2 Periwound Skin Texture Texture Color No Abnormalities Noted: No No Abnormalities Noted: No Moisture No Abnormalities Noted: No Electronic Signature(s) Signed: 08/05/2017 1:25:03 PM By: Gretta Cool, BSN, RN, CWS, Kim RN, BSN Entered By: Gretta Cool, BSN, RN, CWS, Kim on 08/05/2017 11:59:48 Gewirtz, [name] ([id number]) -------------------------------------------------------------------------------- Vitals Details Patient Name: [name] Date of Service: 08/05/2017 11:00 AM Medical Record Number: [id number] Patient Account Number: [id number] Date of Birth/Sex: 06-20-1941 (76 [name].o. Male) Treating RN: Cornell Barman Primary Care Taelar Gronewold: Park Liter Other Clinician: Referring Timberlynn Kizziah: Park Liter Treating Argentina Kosch/Extender: Melburn Hake, HOYT Weeks in Treatment: 4 Vital Signs Time Taken: 11:22 Temperature (F): 98.0 Pulse (bpm): 75 Respiratory Rate (breaths/min): 16 Blood Pressure (mmHg): 129/39 Reference Range: 80 - 120 mg / dl Electronic Signature(s) Signed: 08/05/2017 1:25:03 PM By: Gretta Cool, BSN, RN, CWS, Kim RN, BSN Entered By: Gretta Cool, BSN, RN, CWS, Kim on 08/05/2017 11:24:21

## 2017-08-08 NOTE — Progress Notes (Signed)
DEZMEN, ALCOCK ([id number]) Visit Report for 08/05/2017 Chief Complaint Document Details Patient Name: [name], GEAR. Date of Service: 08/05/2017 11:00 AM Medical Record Number: [id number] Patient Account Number: [id number] Date of Birth/Sex: 09-26-1941 (76 y.o. Male) Treating RN: Cornell Barman Primary Care Provider: Park Liter Other Clinician: Referring Provider: Park Liter Treating Provider/Extender: Melburn Hake, Malaya Cagley Weeks in Treatment: 4 Information Obtained from: Patient Chief Complaint Bilateral LE Lymphedema with left Lower Extermity Ulcer and right Forefoot Maceration Electronic Signature(s) Signed: 08/05/2017 5:05:22 PM By: Worthy Keeler PA-C Entered By: Worthy Keeler on 08/05/2017 11:23:53 Pinnix, [name] ([id number]) -------------------------------------------------------------------------------- HPI Details Patient Name: [name] Date of Service: 08/05/2017 11:00 AM Medical Record Number: [id number] Patient Account Number: [id number] Date of Birth/Sex: 11/27/1940 (76 y.o. Male) Treating RN: Cornell Barman Primary Care Provider: Park Liter Other Clinician: Referring Provider: Park Liter Treating Provider/Extender: Melburn Hake, Rowena Moilanen Weeks in Treatment: 4 History of Present Illness HPI Description: 07/08/17 on evaluation today patient presents initially for issues that he has been having with bilateral lower extremities this has been going on for the best I can tell "about a month" he has been seen by his primary care provider at [facility] family practice and they have been performing wraps. They also did get him set up with home health although there was confusion about what company was providing the home health for him. Unfortunately patient with a poor historian and it was difficult to gather information about how this has really progressed over the past month. Nonetheless he does have erythema noted of the right lower extremity and ulceration at this  location as well. He has what appears to be lymphedema noted of the bilateral lower extremities and the left forefoot region is macerated potentially with a fungal infection noted as well. No fevers, chills, nausea, or vomiting noted at this time. As best I can gather patient has a history of hypothyroidism, hypertension, medicinal and antinausea medication he tells me due to the fact that he has been on the antibiotic. 07/22/2017 -- I'm reviewing this 76 year old gentleman for the first time today, and notes that he was recently in hospital but he got himself discharge AMA, and during this time he was treated for acute hyponatremia, cellulitis and bilateral leg ulceration with a history of alcohol abuse. The patient's hemoglobin A1c was 5.1 the last time he was in hospital and he was also noted to have cellulitis of the lower extremity and since he could not tolerate Augmentin was started on Bactrim. the patient and his son both have limited understanding and knowledge and it seems quite difficult to make them understand the treatment plan 08/05/17 on evaluation today patient's wound appears to be healed and he is doing very well. Unfortunately we have been unable to obtain the compression stockings for him at this point. We will discuss it further and plan. Otherwise he has no other concerns. Electronic Signature(s) Signed: 08/05/2017 5:05:22 PM By: Worthy Keeler PA-C Entered By: Worthy Keeler on 08/05/2017 12:58:38 [name], [name] ([id number]) -------------------------------------------------------------------------------- Physical Exam Details Patient Name: [name], [name] Date of Service: 08/05/2017 11:00 AM Medical Record Number: [id number] Patient Account Number: [id number] Date of Birth/Sex: 1941-04-08 (76 y.o. Male) Treating RN: Cornell Barman Primary Care Provider: Park Liter Other Clinician: Referring Provider: Park Liter Treating Provider/Extender: Melburn Hake, Jessie Schrieber Weeks  in Treatment: 4 Constitutional Well-nourished and well-hydrated in no acute distress. Respiratory normal breathing without difficulty. Psychiatric this patient is able to make decisions and demonstrates good insight  into disease process. Alert and Oriented x 3. pleasant and cooperative. Notes Patientos wound is completely healed Electronic Signature(s) Signed: 08/05/2017 5:05:22 PM By: Worthy Keeler PA-C Entered By: Worthy Keeler on 08/05/2017 13:00:00 [name], [name] ([id number]) -------------------------------------------------------------------------------- Physician Orders Details Patient Name: [name] Date of Service: 08/05/2017 11:00 AM Medical Record Number: [id number] Patient Account Number: [id number] Date of Birth/Sex: 1941-02-05 (76 y.o. Male) Treating RN: Cornell Barman Primary Care Provider: Park Liter Other Clinician: Referring Provider: Park Liter Treating Provider/Extender: Melburn Hake, Roe Koffman Weeks in Treatment: 4 Verbal / Phone Orders: No Diagnosis Coding ICD-10 Coding Code Description I89.0 Lymphedema, not elsewhere classified L97.812 Non-pressure chronic ulcer of other part of right lower leg with fat layer exposed L97.521 Non-pressure chronic ulcer of other part of left foot limited to breakdown of skin I73.9 Peripheral vascular disease, unspecified Wound Cleansing Wound #1 Left,Distal,Dorsal Foot o May Shower, gently pat wound dry prior to applying new dressing. Wound #2 Right,Medial Lower Leg o May Shower, gently pat wound dry prior to applying new dressing. Edema Control o Elevate legs to the level of the heart and pump ankles as often as possible o Support Garment 10-20 mm/Hg pressure to: Home Health Wound #1 Left,Distal,Dorsal Foot o D/C Home Health Services Wound #2 Right,Medial Lower Leg o D/C [place] Discharge From [facility] Services Wound #1 Left,Distal,Dorsal Foot o Discharge from [place] #2  Right,Medial Lower Leg o Discharge from [place] Signature(s) Signed: 08/05/2017 1:25:03 PM By: Gretta Cool, BSN, RN, CWS, Kim RN, BSN Signed: 08/05/2017 5:05:22 PM By: Worthy Keeler PA-C Entered By: Gretta Cool, BSN, RN, CWS, Kim on 08/05/2017 11:58:01 [name], [name] ([id number]) -------------------------------------------------------------------------------- Problem List Details Patient Name: Mathenia, Archer C. Date of Service: 08/05/2017 11:00 AM Medical Record Number: [id number] Patient Account Number: [id number] Date of Birth/Sex: 1940-11-11 (76 y.o. Male) Treating RN: Cornell Barman Primary Care Provider: Park Liter Other Clinician: Referring Provider: Park Liter Treating Provider/Extender: Melburn Hake, Viren Lebeau Weeks in Treatment: 4 Active Problems ICD-10 Encounter Code Description Active Date Diagnosis I89.0 Lymphedema, not elsewhere classified 07/08/2017 Yes L97.812 Non-pressure chronic ulcer of other part of right lower leg with fat 07/08/2017 Yes layer exposed L97.521 Non-pressure chronic ulcer of other part of left foot limited to 07/08/2017 Yes breakdown of skin I73.9 Peripheral vascular disease, unspecified 07/22/2017 Yes Inactive Problems Resolved Problems ICD-10 Code Description Active Date Resolved Date I10 Essential (primary) hypertension 07/08/2017 07/08/2017 E03.9 Hypothyroidism, unspecified 07/08/2017 07/08/2017 Electronic Signature(s) Signed: 08/05/2017 5:05:22 PM By: Worthy Keeler PA-C Entered By: Worthy Keeler on 08/05/2017 11:23:26 [name], [name] ([id number]) -------------------------------------------------------------------------------- Progress Note Details Patient Name: [name] Date of Service: 08/05/2017 11:00 AM Medical Record Number: [id number] Patient Account Number: [id number] Date of Birth/Sex: 26-Aug-1941 (76 y.o. Male) Treating RN: Cornell Barman Primary Care Provider: Park Liter Other Clinician: Referring Provider: Park Liter Treating Provider/Extender: Melburn Hake, Thao Vanover Weeks in Treatment: 4 Subjective Chief Complaint Information obtained from Patient Bilateral LE Lymphedema with left Lower Extermity Ulcer and right Forefoot Maceration History of Present Illness (HPI) 07/08/17 on evaluation today patient presents initially for issues that he has been having with bilateral lower extremities this has been going on for the best I can tell "about a month" he has been seen by his primary care provider at [facility] family practice and they have been performing wraps. They also did get him set up with home health although there was confusion about what company was providing the home health for him. Unfortunately  patient with a poor historian and it was difficult to gather information about how this has really progressed over the past month. Nonetheless he does have erythema noted of the right lower extremity and ulceration at this location as well. He has what appears to be lymphedema noted of the bilateral lower extremities and the left forefoot region is macerated potentially with a fungal infection noted as well. No fevers, chills, nausea, or vomiting noted at this time. As best I can gather patient has a history of hypothyroidism, hypertension, medicinal and antinausea medication he tells me due to the fact that he has been on the antibiotic. 07/22/2017 -- I'm reviewing this 76 year old gentleman for the first time today, and notes that he was recently in hospital but he got himself discharge AMA, and during this time he was treated for acute hyponatremia, cellulitis and bilateral leg ulceration with a history of alcohol abuse. The patient's hemoglobin A1c was 5.1 the last time he was in hospital and he was also noted to have cellulitis of the lower extremity and since he could not tolerate Augmentin was started on Bactrim. the patient and his son both have limited understanding and knowledge and it seems quite  difficult to make them understand the treatment plan 08/05/17 on evaluation today patient's wound appears to be healed and he is doing very well. Unfortunately we have been unable to obtain the compression stockings for him at this point. We will discuss it further and plan. Otherwise he has no other concerns. Patient History Unable to Obtain Patient History due to Altered Mental Status. Information obtained from Patient. Social History Former smoker, Marital Status - Married, Alcohol Use - Moderate, Drug Use - No History, Caffeine Use - Rarely. Review of Systems (ROS) Constitutional Symptoms (General Health) Denies complaints or symptoms of Fever, Chills. [name], [name] ([id number]) Objective Constitutional Well-nourished and well-hydrated in no acute distress. Vitals Time Taken: 11:22 AM, Temperature: 98.0 F, Pulse: 75 bpm, Respiratory Rate: 16 breaths/min, Blood Pressure: 129/39 mmHg. Respiratory normal breathing without difficulty. Psychiatric this patient is able to make decisions and demonstrates good insight into disease process. Alert and Oriented x 3. pleasant and cooperative. General Notes: Patient s wound is completely healed Integumentary (Hair, Skin) Wound #1 status is Healed - Epithelialized. Original cause of wound was Gradually Appeared. The wound is located on the Left,Distal,Dorsal Foot. The wound measures 0cm length x 0cm width x 0cm depth; 0cm^2 area and 0cm^3 volume. Wound #2 status is Healed - Epithelialized. Original cause of wound was Gradually Appeared. The wound is located on the Right,Medial Lower Leg. The wound measures 0cm length x 0cm width x 0cm depth; 0cm^2 area and 0cm^3 volume. Assessment Active Problems ICD-10 I89.0 - Lymphedema, not elsewhere classified L97.812 - Non-pressure chronic ulcer of other part of right lower leg with fat layer exposed L97.521 - Non-pressure chronic ulcer of other part of left foot limited to breakdown of skin I73.9  - Peripheral vascular disease, unspecified Plan Wound Cleansing: Wound #1 Left,Distal,Dorsal Foot: May Shower, gently pat wound dry prior to applying new dressing. Wound #2 Right,Medial Lower Leg: May Shower, gently pat wound dry prior to applying new dressing. Edema Control: Elevate legs to the level of the heart and pump ankles as often as possible Support Garment 10-20 mm/Hg pressure to: Home Health: [name], [name] ([id number]) Wound #1 Left,Distal,Dorsal Foot: D/C Home Health Services Wound #2 Right,Medial Lower Leg: D/C Home Health Services Discharge From [facility] Services: Wound #1 Left,Distal,Dorsal Foot: Discharge from [place]  [facility] Wound #2 Right,Medial Lower Leg: Discharge from [place] At this point we recommended that patient go get compression socks from the drugstore which she states she will do when she gets paid. We will see him for reevaluation in the future if anything worsens otherwise considering he is healed we are going to discharge from the wound care center at this point today. Electronic Signature(s) Signed: 08/05/2017 5:05:22 PM By: Worthy Keeler PA-C Entered By: Worthy Keeler on 08/05/2017 13:01:09 [name], [name] ([id number]) -------------------------------------------------------------------------------- ROS/PFSH Details Patient Name: [name] Date of Service: 08/05/2017 11:00 AM Medical Record Number: [id number] Patient Account Number: [id number] Date of Birth/Sex: [birth year] (76 y.o. Male) Treating RN: Cornell Barman Primary Care Provider: Park Liter Other Clinician: Referring Provider: Park Liter Treating Provider/Extender: Melburn Hake, Laveah Gloster Weeks in Treatment: 4 Unable to Obtain Patient History due to oo Altered Mental Status Information Obtained From Patient Wound History Do you currently have one or more open woundso Yes How many open wounds do you currently haveo 2 Approximately how long have you had your woundso 1  month How have you been treating your wound(s) until nowo wraps Has your wound(s) ever healed and then re-openedo No Have you had any lab work done in the past montho No Have you tested positive for an antibiotic resistant organism (MRSA, VRE)o No Have you tested positive for osteomyelitis (bone infection)o No Have you had any tests for circulation on your legso No Have you had other problems associated with your woundso Infection, Swelling Constitutional Symptoms (General Health) Complaints and Symptoms: Negative for: Fever; Chills Endocrine Medical History: Positive for: Type II Diabetes Treated with: Oral agents Blood sugar tested every day: No Immunizations Pneumococcal Vaccine: Received Pneumococcal Vaccination: No Implantable Devices Family and Social History Former smoker; Marital Status - Married; Alcohol Use: Moderate; Drug Use: No History; Caffeine Use: Rarely; Advanced Directives: No; Patient does not want information on Advanced Directives; Do not resuscitate: No; Living Will: No; Medical Power of Attorney: No Physician Affirmation I have reviewed and agree with the above information. Electronic Signature(s) Signed: 08/05/2017 1:25:03 PM By: Gretta Cool, BSN, RN, CWS, Kim RN, BSN Signed: 08/05/2017 5:05:22 PM By: Milas Kocher, Montgomery ([id number]) Entered By: Worthy Keeler on 08/05/2017 12:58:55 [name], [name] ([id number]) -------------------------------------------------------------------------------- SuperBill Details Patient Name: [name], [name] Date of Service: 08/05/2017 Medical Record Number: [id number] Patient Account Number: [id number] Date of Birth/Sex: Jul 03, 1941 (76 y.o. Male) Treating RN: Cornell Barman Primary Care Provider: Park Liter Other Clinician: Referring Provider: Park Liter Treating Provider/Extender: Melburn Hake, Katleen Carraway Weeks in Treatment: 4 Diagnosis Coding ICD-10 Codes Code Description I89.0 Lymphedema, not elsewhere  classified L97.812 Non-pressure chronic ulcer of other part of right lower leg with fat layer exposed L97.521 Non-pressure chronic ulcer of other part of left foot limited to breakdown of skin I73.9 Peripheral vascular disease, unspecified Facility Procedures CPT4 Code: [id number] Description: 44818 - WOUND CARE VISIT-LEV 2 EST PT Modifier: Quantity: 1 Physician Procedures CPT4 Code Description: [id number] 02637 - WC PHYS LEVEL 2 - EST PT ICD-10 Diagnosis Description I89.0 Lymphedema, not elsewhere classified L97.812 Non-pressure chronic ulcer of other part of right lower leg wit L97.521 Non-pressure chronic ulcer of other  part of left foot limited t I73.9 Peripheral vascular disease, unspecified Modifier: h fat layer expo o breakdown of s Quantity: 1 sed kin Electronic Signature(s) Signed: 08/05/2017 5:05:22 PM By: Worthy Keeler PA-C Entered By: Worthy Keeler on 08/05/2017 13:01:31

## 2017-08-08 NOTE — Progress Notes (Signed)
Subjective:    Patient ID: [name], male    DOB: May 23, 1941, 76 y.o.   MRN: [id number] Chief Complaint  Patient presents with  . Follow-up    abnormal u/s results   Presents as a new patient referred by Dr. Con Memos. The patient presents with a chief complaint of bilateral calf claudication. This has progressively worsened over the recent months. Patient has noticed a shortening in his claudication distance. Symptoms to the left lower extremity are greater when compared to the right. Patient recently treated by Dr. Con Memos for bilateral lower extremity edema with calf ulcerations. At this time, the patient states that his ulcerations have healed. He continues to wear his medical grade 1 compression stockings on a daily basis and elevate his legs. He denies any rest pain. Patient denies any fever, nausea vomiting. The patient underwent a bilateral arterial duplex exam which was notable for right lower extremity: Biphasic blood flow distally with a monophasic posterior tibial. Left lower extremity: Biphasic femoral artery with no flow in the superficial femoral artery (mid) with monophasic flow to the popliteal and tibials. No flow in the posterior tibial all her artery.   Review of Systems  Constitutional: Negative.   HENT: Negative.   Eyes: Negative.   Respiratory: Negative.   Cardiovascular:       Claudication  Gastrointestinal: Negative.   Endocrine: Negative.   Genitourinary: Negative.   Musculoskeletal: Negative.   Skin: Negative.   Allergic/Immunologic: Negative.   Neurological: Negative.   Hematological: Negative.   Psychiatric/Behavioral: Negative.       Objective:   Physical Exam  Constitutional: He is oriented to person, place, and time. He appears well-developed and well-nourished. No distress.  HENT:  Head: Normocephalic and atraumatic.  Eyes: Pupils are equal, round, and reactive to light.  Neck: Normal range of motion.  Cardiovascular: Normal rate, regular  rhythm, normal heart sounds and intact distal pulses.   Pulses:      Radial pulses are 2+ on the right side, and 2+ on the left side.       Dorsalis pedis pulses are 1+ on the right side, and 1+ on the left side.       Posterior tibial pulses are 0 on the right side, and 0 on the left side.  Pulmonary/Chest: Effort normal.  Musculoskeletal: Normal range of motion. He exhibits edema (Mild bilateral lower extremity edema).  Neurological: He is alert and oriented to person, place, and time.  Skin: Skin is warm and dry. He is not diaphoretic.  Skin is intact  Psychiatric: He has a normal mood and affect. His behavior is normal. Judgment and thought content normal.  Vitals reviewed.  BP (!) 130/51 (BP Location: Right Arm)   Pulse 94   Resp 16   Wt 184 lb (83.5 kg)   BMI 30.62 kg/m   Past Medical History:  Diagnosis Date  . Diabetes mellitus   . 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 Hypertension   . Hypothyroidism   . Neuropathy   . Venous stasis    Social History   Social History  . Marital status: Married    Spouse name: N/A  . Number of children: 2  . Years of education: N/A   Occupational History  . Custodian    Social History Main Topics  . Smoking status: Former Smoker    Packs/day: 0.25    Types: Cigarettes    Quit date: 03/22/2016  .  Smokeless tobacco: Current User    Types: Chew  . Alcohol use 25.2 oz/week    42 Cans of beer per week     Comment: 5-6 every day  . Drug use: No  . Sexual activity: Not on file     Comment: currently trying to quit now   Other Topics Concern  . Not on file   Social History Narrative   Lives at home with son, ambulates with a cane   Past Surgical History:  Procedure Laterality Date  . CATARACT EXTRACTION W/PHACO Right 10/25/2016   Procedure: CATARACT EXTRACTION PHACO AND INTRAOCULAR LENS PLACEMENT (IOC)  right eye complicated;  Surgeon: Ronnell Freshwater, MD;  Location: [facility];  Service: Ophthalmology;  Laterality: Right;  Right eye Diabetic - oral meds Vision blue  . FINGER SURGERY      Family History  Problem Relation Age of Onset  . Heart disease Mother   . Alzheimer's disease Sister   . Heart disease Maternal Aunt    Allergies  Allergen Reactions  . Metformin And Related Nausea And Vomiting      Assessment & Plan:  Presents as a new patient referred by Dr. Con Memos. The patient presents with a chief complaint of bilateral calf claudication. This has progressively worsened over the recent months. Patient has noticed a shortening in his claudication distance. Symptoms to the left lower extremity are greater when compared to the right. Patient recently treated by Dr. Con Memos for bilateral lower extremity edema with calf ulcerations. At this time, the patient states that his ulcerations have healed. He continues to wear his medical grade 1 compression stockings on a daily basis and elevate his legs. He denies any rest pain. Patient denies any fever, nausea vomiting. The patient underwent a bilateral arterial duplex exam which was notable for right lower extremity: Biphasic blood flow distally with a monophasic posterior tibial. Left lower extremity: Biphasic femoral artery with no flow in the superficial femoral artery (mid) with monophasic flow to the popliteal and tibials. No flow in the posterior tibial all her artery.  1. PAD (peripheral artery disease) ([place]) - New Peripheral artery disease seen on bilateral lower extremity arterial duplex today. Patient with bilateral calf claudication. Left lower extremity is more severe than right. His discomfort has progressed to the point he is unable to function on a daily basis. The patient does have a history of bilateral calf ulcerations associated with edema Recommend a left lower extremity angiogram with possible intervention to assess anatomy and possibly revascularize the leg if appropriate. Procedure,  risks and benefits explained to the patient All questions answered Patient wishes to proceed  2. Type 2 diabetes, uncontrolled, with neuropathy (HCC) - Stable Encouraged good control as its slows the progression of atherosclerotic disease  Current Outpatient Prescriptions on File Prior to Visit  Medication Sig Dispense Refill  . aspirin EC 81 MG tablet Take 81 mg by mouth daily.    Marland Kitchen levothyroxine (SYNTHROID, LEVOTHROID) 75 MCG tablet Take 1 tablet (75 mcg total) by mouth daily before breakfast. 90 tablet 3  . lisinopril (PRINIVIL,ZESTRIL) 5 MG tablet Take 1 tablet (5 mg total) by mouth daily. 90 tablet 1   No current facility-administered medications on file prior to visit.    There are no Patient Instructions on file for this visit. No Follow-up on file.  Tayten Bergdoll A Wadie Mattie, PA-C

## 2017-08-08 NOTE — Telephone Encounter (Signed)
Copied from [place] #[id number]. Topic: Quick Communication - See Telephone Encounter >> Aug 08, 2017 10:45 AM Ether Griffins B wrote: CRM for notification. See Telephone encounter for:  08/08/17.  Nausea meds refill

## 2017-08-09 ENCOUNTER — Ambulatory Visit
Admission: RE | Admit: 2017-08-09 | Discharge: 2017-08-09 | Disposition: A | Discharge status: Home / Self Care | Payer: Medicare Other | Source: Ambulatory Visit | Attending: Vascular Surgery | Admitting: Vascular Surgery

## 2017-08-09 ENCOUNTER — Inpatient Hospital Stay: Payer: Medicare Other

## 2017-08-09 ENCOUNTER — Encounter
Admission: RE | Disposition: A | Discharge status: Home / Self Care | Payer: Self-pay | Source: Ambulatory Visit | Attending: Vascular Surgery

## 2017-08-09 ENCOUNTER — Encounter: Payer: Self-pay | Admitting: *Deleted

## 2017-08-09 ENCOUNTER — Inpatient Hospital Stay: Payer: Medicare Other | Admitting: Oncology

## 2017-08-09 DIAGNOSIS — Z9841 Cataract extraction status, right eye: Secondary | ICD-10-CM | POA: Diagnosis not present

## 2017-08-09 DIAGNOSIS — N189 Chronic kidney disease, unspecified: Secondary | ICD-10-CM | POA: Diagnosis not present

## 2017-08-09 DIAGNOSIS — E039 Hypothyroidism, unspecified: Secondary | ICD-10-CM | POA: Diagnosis not present

## 2017-08-09 DIAGNOSIS — I739 Peripheral vascular disease, unspecified: Secondary | ICD-10-CM | POA: Insufficient documentation

## 2017-08-09 DIAGNOSIS — Z5309 Procedure and treatment not carried out because of other contraindication: Secondary | ICD-10-CM | POA: Diagnosis not present

## 2017-08-09 DIAGNOSIS — I129 Hypertensive chronic kidney disease with stage 1 through stage 4 chronic kidney disease, or unspecified chronic kidney disease: Secondary | ICD-10-CM | POA: Insufficient documentation

## 2017-08-09 DIAGNOSIS — Z8249 Family history of ischemic heart disease and other diseases of the circulatory system: Secondary | ICD-10-CM | POA: Diagnosis not present

## 2017-08-09 DIAGNOSIS — Z539 Procedure and treatment not carried out, unspecified reason: Secondary | ICD-10-CM | POA: Diagnosis present

## 2017-08-09 DIAGNOSIS — Z7982 Long term (current) use of aspirin: Secondary | ICD-10-CM | POA: Insufficient documentation

## 2017-08-09 DIAGNOSIS — Z79899 Other long term (current) drug therapy: Secondary | ICD-10-CM | POA: Diagnosis not present

## 2017-08-09 DIAGNOSIS — Z87891 Personal history of nicotine dependence: Secondary | ICD-10-CM | POA: Diagnosis not present

## 2017-08-09 DIAGNOSIS — Z820 Family history of epilepsy and other diseases of the nervous system: Secondary | ICD-10-CM | POA: Diagnosis not present

## 2017-08-09 DIAGNOSIS — Z888 Allergy status to other drugs, medicaments and biological substances status: Secondary | ICD-10-CM | POA: Diagnosis not present

## 2017-08-09 DIAGNOSIS — E1122 Type 2 diabetes mellitus with diabetic chronic kidney disease: Secondary | ICD-10-CM | POA: Insufficient documentation

## 2017-08-09 HISTORY — DX: Peripheral vascular disease, unspecified: I73.9

## 2017-08-09 LAB — PROTIME-INR
INR: 1.16
PROTHROMBIN TIME: 14.7 s (ref 11.4–15.2)

## 2017-08-09 LAB — BASIC METABOLIC PANEL
ANION GAP: 6 (ref 5–15)
BUN: 10 mg/dL (ref 6–20)
CO2: 25 mmol/L (ref 22–32)
Calcium: 8.4 mg/dL — ABNORMAL LOW (ref 8.9–10.3)
Chloride: 107 mmol/L (ref 101–111)
Creatinine, Ser: 1 mg/dL (ref 0.61–1.24)
GFR calc Af Amer: >60 mL/min (ref >60)
GLUCOSE: 124 mg/dL — AB (ref 65–99)
POTASSIUM: 4.2 mmol/L (ref 3.5–5.1)
Sodium: 138 mmol/L (ref 135–145)

## 2017-08-09 LAB — HEPATIC FUNCTION PANEL
ALBUMIN: 3 g/dL — AB (ref 3.5–5.0)
ALT: 21 U/L (ref 17–63)
AST: 36 U/L (ref 15–41)
Alkaline Phosphatase: 198 U/L — ABNORMAL HIGH (ref 38–126)
Bilirubin, Direct: 0.3 mg/dL (ref 0.1–0.5)
Indirect Bilirubin: 0.6 mg/dL (ref 0.3–0.9)
TOTAL PROTEIN: 5.4 g/dL — AB (ref 6.5–8.1)
Total Bilirubin: 0.9 mg/dL (ref 0.3–1.2)

## 2017-08-09 LAB — HEMOGLOBIN AND HEMATOCRIT, BLOOD
HCT: 21.1 % — ABNORMAL LOW (ref 40.0–52.0)
Hemoglobin: 7.1 g/dL — ABNORMAL LOW (ref 13.0–18.0)

## 2017-08-09 SURGERY — LOWER EXTREMITY ANGIOGRAPHY
Anesthesia: Moderate Sedation | Laterality: Left

## 2017-08-09 MED ORDER — METHYLPREDNISOLONE SODIUM SUCC 125 MG IJ SOLR: 125.0000 mg | INTRAMUSCULAR | Status: DC | PRN

## 2017-08-09 MED ORDER — SODIUM CHLORIDE 0.9 % IV SOLN
INTRAVENOUS | Status: DC
  Administered 2017-08-09: 09:00:00 via INTRAVENOUS

## 2017-08-09 MED ORDER — ONDANSETRON HCL 4 MG/2ML IJ SOLN: 4.0000 mg | Freq: Four times a day (QID) | INTRAMUSCULAR | Status: DC | PRN

## 2017-08-09 MED ORDER — FAMOTIDINE 20 MG PO TABS: 40.0000 mg | ORAL_TABLET | ORAL | Status: DC | PRN

## 2017-08-09 MED ORDER — HYDROMORPHONE HCL 1 MG/ML IJ SOLN: 1.0000 mg | Freq: Once | INTRAMUSCULAR | Status: DC | PRN

## 2017-08-16 ENCOUNTER — Ambulatory Visit (INDEPENDENT_AMBULATORY_CARE_PROVIDER_SITE_OTHER): Payer: Medicare Other | Admitting: Family Medicine

## 2017-08-16 ENCOUNTER — Encounter: Payer: Self-pay | Admitting: Family Medicine

## 2017-08-16 VITALS — BP 137/60 | HR 79 | Wt 184.0 lb

## 2017-08-16 DIAGNOSIS — M25561 Pain in right knee: Secondary | ICD-10-CM

## 2017-08-16 DIAGNOSIS — D61818 Other pancytopenia: Secondary | ICD-10-CM

## 2017-08-16 DIAGNOSIS — D72819 Decreased white blood cell count, unspecified: Secondary | ICD-10-CM | POA: Diagnosis not present

## 2017-08-16 DIAGNOSIS — M25562 Pain in left knee: Secondary | ICD-10-CM

## 2017-08-16 DIAGNOSIS — G8929 Other chronic pain: Secondary | ICD-10-CM | POA: Diagnosis not present

## 2017-08-16 MED ORDER — LEVOTHYROXINE SODIUM 75 MCG PO TABS: 75.0000 ug | ORAL_TABLET | Freq: Every day | ORAL | 3 refills | Status: DC

## 2017-08-16 MED ORDER — DICLOFENAC SODIUM 1 % TD GEL
4.0000 g | Freq: Four times a day (QID) | TRANSDERMAL | 12 refills | Status: DC
Start: 2017-08-16 — End: 2017-09-26

## 2017-08-16 NOTE — Assessment & Plan Note (Signed)
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 Due to see hematology on Thursday (2 days from now). CBC currently stable. Await their input. Call with any concerns.

## 2017-08-16 NOTE — Progress Notes (Signed)
BP 137/60   Pulse 79   Wt 184 lb (83.5 kg)   SpO2 100%   BMI 27.17 kg/m    Subjective:    Patient ID: [name], male    DOB: 1941/09/09, 76 y.o.   MRN: [id number]  HPI: [name] is a 76 y.o. male  Chief Complaint  Patient presents with  . Anemia   ANEMIA- seeing oncology on Thursday for his anemia, had to have his angiography cancelled because of his anemia and wanted to come  Anemia status: uncontrolled Duration of anemia treatment: Not on anything Severity of anemia: moderate Fatigue: yes Decreased exercise tolerance: yes  Dyspnea on exertion: yes Palpitations: no Bleeding: no Pica: no  Having joint pains. Not doing anything for them right now. Knees hurting when it's raining. No radiation. Better with rest and worse with movement. Aching in nature. Otherwise feeling well.   Relevant past medical, surgical, family and social history reviewed and updated as indicated. Interim medical history since our last visit reviewed. Allergies and medications reviewed and updated.  Review of Systems  Constitutional: Negative.   Respiratory: Negative.   Cardiovascular: Negative.   Musculoskeletal: Positive for arthralgias. Negative for back pain, gait problem, joint swelling, myalgias, neck pain and neck stiffness.  Psychiatric/Behavioral: Negative.     Per HPI unless specifically indicated above     Objective:    BP 137/60   Pulse 79   Wt 184 lb (83.5 kg)   SpO2 100%   BMI 27.17 kg/m   Wt Readings from Last 3 Encounters:  08/16/17 184 lb (83.5 kg)  08/09/17 185 lb (83.9 kg)  08/08/17 184 lb (83.5 kg)    Physical Exam  Constitutional: He is oriented to person, place, and time. He appears well-developed and well-nourished. No distress.  HENT:  Head: Normocephalic and atraumatic.  Right Ear: Hearing normal.  Left Ear: Hearing normal.  Nose: Nose normal.  Eyes: Conjunctivae and lids are normal. Right eye exhibits no discharge. Left eye exhibits no  discharge. No scleral icterus.  Cardiovascular: Normal rate, regular rhythm, normal heart sounds and intact distal pulses. Exam reveals no gallop and no friction rub.  No murmur heard. Pulmonary/Chest: Effort normal and breath sounds normal. No respiratory distress. He has no wheezes. He has no rales. He exhibits no tenderness.  Musculoskeletal: Normal range of motion.  Neurological: He is alert and oriented to person, place, and time.  Skin: Skin is warm, dry and intact. No rash noted. He is not diaphoretic. No erythema. No pallor.  Psychiatric: He has a normal mood and affect. His speech is normal and behavior is normal. Judgment and thought content normal. Cognition and memory are normal.    Results for orders placed or performed during the hospital encounter of 73/22/02  Basic metabolic panel  Result Value Ref Range   Sodium 138 135 - 145 mmol/L   Potassium 4.2 3.5 - 5.1 mmol/L   Chloride 107 101 - 111 mmol/L   CO2 25 22 - 32 mmol/L   Glucose, Bld 124 (H) 65 - 99 mg/dL   BUN 10 6 - 20 mg/dL   Creatinine, Ser 1.00 0.61 - 1.24 mg/dL   Calcium 8.4 (L) 8.9 - 10.3 mg/dL   GFR calc non Af Amer >60 >60 mL/min   GFR calc Af Amer >60 >60 mL/min   Anion gap 6 5 - 15  Hemoglobin and hematocrit, blood  Result Value Ref Range   Hemoglobin 7.1 (L) 13.0 - 18.0 g/dL  HCT 21.1 (L) 40.0 - 52.0 %  Protime-INR  Result Value Ref Range   Prothrombin Time 14.7 11.4 - 15.2 seconds   INR 1.16   Hepatic function panel  Result Value Ref Range   Total Protein 5.4 (L) 6.5 - 8.1 g/dL   Albumin 3.0 (L) 3.5 - 5.0 g/dL   AST 36 15 - 41 U/L   ALT 21 17 - 63 U/L   Alkaline Phosphatase 198 (H) 38 - 126 U/L   Total Bilirubin 0.9 0.3 - 1.2 mg/dL   Bilirubin, Direct 0.3 0.1 - 0.5 mg/dL   Indirect Bilirubin 0.6 0.3 - 0.9 mg/dL      Assessment & Plan:   Problem List Items Addressed This Visit      Hematopoietic and Hemostatic   Pancytopenia (HCC) - Primary    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 Due to see hematology on Thursday (2 days from now). CBC currently stable. Await their input. Call with any concerns.       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 Due to see hematology on Thursday (2 days from now). CBC currently stable. Await their input. Call with any concerns.       Relevant Orders   CBC With Differential/Platelet   Iron and TIBC   Ferritin   B12 and Folate Panel    Other Visit Diagnoses    Chronic pain of both knees       Will treat with voltaren due to his kidney function. Call with any concerns. Continue to monitor.        Follow up plan: Return As scheduled, for DM follow up.

## 2017-08-17 LAB — IRON AND TIBC
Iron Saturation: 66 % — ABNORMAL HIGH (ref 15–55)
Iron: 121 ug/dL (ref 38–169)
Total Iron Binding Capacity: 182 ug/dL — ABNORMAL LOW (ref 250–450)
UIBC: 61 ug/dL — ABNORMAL LOW (ref 111–343)

## 2017-08-17 LAB — SPECIMEN STATUS REPORT

## 2017-08-17 LAB — B12 AND FOLATE PANEL
FOLATE: 10.8 ng/mL (ref >3.0)
VITAMIN B 12: 224 pg/mL — AB (ref 232–1245)

## 2017-08-17 LAB — FERRITIN: FERRITIN: 542 ng/mL — AB (ref 30–400)

## 2017-08-17 NOTE — Progress Notes (Signed)
[place]  Telephone:(336) [phone] Fax:(336) [phone]  ID: BARNABY RIPPEON OB: 10-Jul-1941  MR#: [id number]  WCB#:[id number]  Patient Care Team: Valerie Roys, DO as PCP - General (Family Medicine)  CHIEF COMPLAINT: Pancytopenia  INTERVAL HISTORY: Patient last evaluated in February 2018.  He is referred back for persistent anemia as well as pancytopenia.  He was scheduled for angiography of his lower extremity, but this is been delayed given his anemia.  He currently feels well and is asymptomatic. He denies any fevers or illnesses. He denies any easy bleeding or bruising. He denies any weakness or fatigue. He denies any chest pain or shortness of breath. He has no nausea, vomiting, constipation, or diarrhea. He has no urinary complaints. Patient feels at his baseline and offers no specific complaints today.  REVIEW OF SYSTEMS:   Review of Systems  Constitutional: Negative.  Negative for fever and malaise/fatigue.  Respiratory: Negative.  Negative for cough and shortness of breath.   Cardiovascular: Negative.  Negative for chest pain and leg swelling.  Gastrointestinal: Negative.  Negative for abdominal pain, blood in stool and melena.  Musculoskeletal: Negative.   Neurological: Negative.  Negative for sensory change and weakness.  Endo/Heme/Allergies: Does not bruise/bleed easily.  Psychiatric/Behavioral: Positive for substance abuse. The patient is not nervous/anxious.     As per HPI. Otherwise, a complete review of systems is negative.  PAST MEDICAL HISTORY: Past Medical History:  Diagnosis Date  . Diabetes mellitus   . 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 Hypertension   . Hypothyroidism   . Neuropathy   . Peripheral vascular disease ([place])   . Venous stasis     PAST SURGICAL HISTORY: Past Surgical History:  Procedure Laterality Date  . FINGER SURGERY      FAMILY HISTORY Family History  Problem Relation Age  of Onset  . Heart disease Mother   . Alzheimer's disease Sister   . Heart disease Maternal Aunt        ADVANCED DIRECTIVES:    HEALTH MAINTENANCE: Social History   Tobacco Use  . Smoking status: Former Smoker    Packs/day: 0.25    Types: Cigarettes    Last attempt to quit: 03/22/2016    Years since quitting: 1.4  . Smokeless tobacco: Current User    Types: Chew  Substance Use Topics  . Alcohol use: Yes    Alcohol/week: 25.2 oz    Types: 42 Cans of beer per week    Comment: 5-6 every day  . Drug use: No     Colonoscopy:  PAP:  Bone density:  Lipid panel:  Allergies  Allergen Reactions  . Metformin And Related Nausea And Vomiting    Current Outpatient Medications  Medication Sig Dispense Refill  . aspirin EC 81 MG tablet Take 81 mg by mouth daily.    . diclofenac sodium (VOLTAREN) 1 % GEL Apply 4 g 4 (four) times daily topically. 100 g 12  . empagliflozin (JARDIANCE) 10 MG TABS tablet Take 10 mg by mouth daily.    Marland Kitchen levothyroxine (SYNTHROID, LEVOTHROID) 75 MCG tablet Take 1 tablet (75 mcg total) daily before breakfast by mouth. 90 tablet 3  . lisinopril (PRINIVIL,ZESTRIL) 5 MG tablet Take 1 tablet (5 mg total) by mouth daily. 90 tablet 1   No current facility-administered medications for this visit.     OBJECTIVE: Vitals:   08/18/17 1352  BP: (!) 122/49  Pulse: 80  Resp: 20  Temp: (!) 96.5 F (35.8 C)     Body mass index is 27.39 kg/m.    ECOG FS:0 - Asymptomatic  General: Well-developed, well-nourished, no acute distress. Eyes: Pink conjunctiva, anicteric sclera. Lungs: Clear to auscultation bilaterally. Heart: Regular rate and rhythm. No rubs, murmurs, or gallops. Abdomen: Soft, nontender, nondistended. No organomegaly noted, normoactive bowel sounds. Musculoskeletal: No edema, cyanosis, or clubbing. Neuro: Alert, answering all questions appropriately. Cranial nerves grossly intact. Skin: No rashes or petechiae noted. Psych: Normal affect.   LAB  RESULTS:  Lab Results  Component Value Date   NA 138 08/09/2017   K 4.2 08/09/2017   CL 107 08/09/2017   CO2 25 08/09/2017   GLUCOSE 124 (H) 08/09/2017   BUN 10 08/09/2017   CREATININE 1.00 08/09/2017   CALCIUM 8.4 (L) 08/09/2017   PROT 5.4 (L) 08/09/2017   ALBUMIN 3.0 (L) 08/09/2017   AST 36 08/09/2017   ALT 21 08/09/2017   ALKPHOS 198 (H) 08/09/2017   BILITOT 0.9 08/09/2017   GFRNONAA >60 08/09/2017   GFRAA >60 08/09/2017    Lab Results  Component Value Date   WBC 4.2 08/18/2017   NEUTROABS 3.0 08/18/2017   HGB 7.5 (L) 08/18/2017   HCT 21.7 (L) 08/18/2017   MCV 120.5 (H) 08/18/2017   PLT 108 (L) 08/18/2017     STUDIES: No results found.  ASSESSMENT:  Pancytopenia.   PLAN:  1.  Pancytopenia: Patient's white blood cell count is now within normal limits.  Previously, the remainder of his laboratory work including peripheral blood flow cytometry was negative.  He was noted to have B12 deficiency. Suspect his pancytopenia is secondary to bone marrow suppression from patient's persistent and heavy alcohol use.  After lengthy discussion with the patient and his son, he has agreed to proceed with bone marrow biopsy to assure there is no alternative etiology.  Return to clinic 1 week after his biopsy to discuss the results.   2. Anemia: Persistent.  Patient noted to have B12 deficiency and was given 1000 mcg intramuscular B12 today.  Bone marrow biopsy and follow-up as above.  3. Thrombocytopenia: Mild, likely secondary to alcohol use. Monitor.  Consider abdominal ultrasound in the future to assess for splenomegaly.  Approximately 30 minutes was spent in discussion of which greater than 50% was consultation.  Patient expressed understanding and was in agreement with this plan. He also understands that He can call clinic at any time with any questions, concerns, or complaints.    Lloyd Huger, MD   08/19/2017 11:37 AM

## 2017-08-18 ENCOUNTER — Inpatient Hospital Stay: Payer: Medicare Other

## 2017-08-18 ENCOUNTER — Inpatient Hospital Stay: Payer: Medicare Other | Attending: Oncology | Admitting: Oncology

## 2017-08-18 ENCOUNTER — Encounter: Payer: Self-pay | Admitting: Oncology

## 2017-08-18 ENCOUNTER — Other Ambulatory Visit: Payer: Self-pay

## 2017-08-18 VITALS — BP 122/49 | HR 80 | Temp 96.5°F | Resp 20 | Wt 185.5 lb

## 2017-08-18 DIAGNOSIS — Z87891 Personal history of nicotine dependence: Secondary | ICD-10-CM | POA: Insufficient documentation

## 2017-08-18 DIAGNOSIS — I10 Essential (primary) hypertension: Secondary | ICD-10-CM | POA: Diagnosis not present

## 2017-08-18 DIAGNOSIS — F1910 Other psychoactive substance abuse, uncomplicated: Secondary | ICD-10-CM | POA: Insufficient documentation

## 2017-08-18 DIAGNOSIS — Z79899 Other long term (current) drug therapy: Secondary | ICD-10-CM | POA: Insufficient documentation

## 2017-08-18 DIAGNOSIS — E1140 Type 2 diabetes mellitus with diabetic neuropathy, unspecified: Secondary | ICD-10-CM | POA: Diagnosis not present

## 2017-08-18 DIAGNOSIS — E039 Hypothyroidism, unspecified: Secondary | ICD-10-CM

## 2017-08-18 DIAGNOSIS — D649 Anemia, unspecified: Secondary | ICD-10-CM

## 2017-08-18 DIAGNOSIS — D61818 Other pancytopenia: Secondary | ICD-10-CM | POA: Insufficient documentation

## 2017-08-18 DIAGNOSIS — Z7982 Long term (current) use of aspirin: Secondary | ICD-10-CM | POA: Diagnosis not present

## 2017-08-18 DIAGNOSIS — F1010 Alcohol abuse, uncomplicated: Secondary | ICD-10-CM | POA: Diagnosis not present

## 2017-08-18 DIAGNOSIS — E1151 Type 2 diabetes mellitus with diabetic peripheral angiopathy without gangrene: Secondary | ICD-10-CM | POA: Insufficient documentation

## 2017-08-18 DIAGNOSIS — C9000 Multiple myeloma not having achieved remission: Secondary | ICD-10-CM | POA: Diagnosis not present

## 2017-08-18 LAB — CBC WITH DIFFERENTIAL/PLATELET
Basophils Absolute: 0 10*3/uL (ref 0–0.1)
Basophils Relative: 1 %
EOS ABS: 0 10*3/uL (ref 0–0.7)
Eosinophils Relative: 1 %
HCT: 21.7 % — ABNORMAL LOW (ref 40.0–52.0)
HEMOGLOBIN: 7.5 g/dL — AB (ref 13.0–18.0)
LYMPHS ABS: 0.8 10*3/uL — AB (ref 1.0–3.6)
Lymphocytes Relative: 19 %
MCH: 41.8 pg — AB (ref 26.0–34.0)
MCHC: 34.7 g/dL (ref 32.0–36.0)
MCV: 120.5 fL — ABNORMAL HIGH (ref 80.0–100.0)
MONO ABS: 0.4 10*3/uL (ref 0.2–1.0)
MONOS PCT: 9 %
NEUTROS PCT: 70 %
Neutro Abs: 3 10*3/uL (ref 1.4–6.5)
Platelets: 108 10*3/uL — ABNORMAL LOW (ref 150–440)
RBC: 1.8 MIL/uL — ABNORMAL LOW (ref 4.40–5.90)
RDW: 15.1 % — AB (ref 11.5–14.5)
WBC: 4.2 10*3/uL (ref 3.8–10.6)

## 2017-08-18 MED ORDER — CYANOCOBALAMIN 1000 MCG/ML IJ SOLN
1000.0000 ug | Freq: Once | INTRAMUSCULAR | Status: AC
  Administered 2017-08-18: 1000 ug via INTRAMUSCULAR
  Filled 2017-08-18: qty 1

## 2017-08-18 NOTE — Progress Notes (Signed)
Patient states he is here for follow up today because his recent surgery had to be cancelled due to "low blood counts".

## 2017-08-23 ENCOUNTER — Telehealth: Payer: Self-pay | Admitting: Family Medicine

## 2017-08-23 ENCOUNTER — Telehealth: Payer: Self-pay | Admitting: Gastroenterology

## 2017-08-23 ENCOUNTER — Ambulatory Visit: Payer: Medicare Other | Admitting: Gastroenterology

## 2017-08-23 NOTE — Telephone Encounter (Signed)
Called and spoke to patient after he was a no show for a confirmed appt. He stated he didn't want to come in today and will not be rescheduling. I told him I would let his PCP know.

## 2017-08-23 NOTE — Telephone Encounter (Signed)
Copied from [place] [id number]. Topic: General - Other >> Aug 23, 2017  3:45 PM Carolyn Stare wrote: Reason for CRM:  Shaunna with [facility] call and said pt is choosing not to be treated.

## 2017-08-30 ENCOUNTER — Other Ambulatory Visit: Payer: Self-pay | Admitting: Radiology

## 2017-08-31 ENCOUNTER — Other Ambulatory Visit (HOSPITAL_COMMUNITY)
Admission: RE | Admit: 2017-08-31 | Disposition: A | Discharge status: Home / Self Care | Payer: Medicare Other | Source: Ambulatory Visit | Attending: Oncology | Admitting: Oncology

## 2017-08-31 ENCOUNTER — Ambulatory Visit
Admission: RE | Admit: 2017-08-31 | Discharge: 2017-08-31 | Disposition: A | Discharge status: Home / Self Care | Payer: Medicare Other | Source: Ambulatory Visit | Attending: Oncology | Admitting: Oncology

## 2017-08-31 ENCOUNTER — Ambulatory Visit: Admission: RE | Admit: 2017-08-31 | Payer: Medicare Other | Source: Ambulatory Visit

## 2017-08-31 DIAGNOSIS — E1140 Type 2 diabetes mellitus with diabetic neuropathy, unspecified: Secondary | ICD-10-CM | POA: Diagnosis not present

## 2017-08-31 DIAGNOSIS — Z87891 Personal history of nicotine dependence: Secondary | ICD-10-CM | POA: Diagnosis not present

## 2017-08-31 DIAGNOSIS — C9000 Multiple myeloma not having achieved remission: Secondary | ICD-10-CM | POA: Insufficient documentation

## 2017-08-31 DIAGNOSIS — Z7984 Long term (current) use of oral hypoglycemic drugs: Secondary | ICD-10-CM | POA: Diagnosis not present

## 2017-08-31 DIAGNOSIS — Z7982 Long term (current) use of aspirin: Secondary | ICD-10-CM | POA: Diagnosis not present

## 2017-08-31 DIAGNOSIS — Z79899 Other long term (current) drug therapy: Secondary | ICD-10-CM | POA: Diagnosis not present

## 2017-08-31 DIAGNOSIS — I10 Essential (primary) hypertension: Secondary | ICD-10-CM | POA: Diagnosis not present

## 2017-08-31 DIAGNOSIS — E039 Hypothyroidism, unspecified: Secondary | ICD-10-CM | POA: Insufficient documentation

## 2017-08-31 DIAGNOSIS — D61818 Other pancytopenia: Secondary | ICD-10-CM | POA: Insufficient documentation

## 2017-08-31 DIAGNOSIS — E1151 Type 2 diabetes mellitus with diabetic peripheral angiopathy without gangrene: Secondary | ICD-10-CM | POA: Insufficient documentation

## 2017-08-31 HISTORY — DX: Unspecified osteoarthritis, unspecified site: M19.90

## 2017-08-31 HISTORY — DX: Dyspnea, unspecified: R06.00

## 2017-08-31 LAB — CBC WITH DIFFERENTIAL/PLATELET
BASOS PCT: 1 %
Basophils Absolute: 0 10*3/uL (ref 0–0.1)
EOS ABS: 0.1 10*3/uL (ref 0–0.7)
EOS PCT: 2 %
HCT: 24.1 % — ABNORMAL LOW (ref 40.0–52.0)
Hemoglobin: 8.3 g/dL — ABNORMAL LOW (ref 13.0–18.0)
LYMPHS ABS: 0.7 10*3/uL — AB (ref 1.0–3.6)
Lymphocytes Relative: 20 %
MCH: 41.5 pg — AB (ref 26.0–34.0)
MCHC: 34.4 g/dL (ref 32.0–36.0)
MCV: 120.6 fL — ABNORMAL HIGH (ref 80.0–100.0)
Monocytes Absolute: 0.3 10*3/uL (ref 0.2–1.0)
Monocytes Relative: 9 %
Neutro Abs: 2.5 10*3/uL (ref 1.4–6.5)
Neutrophils Relative %: 68 %
PLATELETS: 109 10*3/uL — AB (ref 150–440)
RBC: 2 MIL/uL — AB (ref 4.40–5.90)
RDW: 15.4 % — ABNORMAL HIGH (ref 11.5–14.5)
WBC: 3.7 10*3/uL — AB (ref 3.8–10.6)

## 2017-08-31 MED ORDER — SODIUM CHLORIDE 0.9 % IV SOLN
INTRAVENOUS | Status: DC
  Administered 2017-08-31: 08:00:00 via INTRAVENOUS

## 2017-08-31 MED ORDER — HEPARIN SOD (PORK) LOCK FLUSH 100 UNIT/ML IV SOLN
INTRAVENOUS | Status: AC
  Filled 2017-08-31: qty 5

## 2017-08-31 MED ORDER — MIDAZOLAM HCL 5 MG/5ML IJ SOLN
INTRAMUSCULAR | Status: AC
Start: 2017-08-31 — End: 2017-08-31
  Filled 2017-08-31: qty 5

## 2017-08-31 MED ORDER — LIDOCAINE HCL (PF) 1 % IJ SOLN
INTRAMUSCULAR | Status: AC | PRN
  Administered 2017-08-31: 7 mL

## 2017-08-31 MED ORDER — FENTANYL CITRATE (PF) 100 MCG/2ML IJ SOLN
INTRAMUSCULAR | Status: AC
  Filled 2017-08-31: qty 4

## 2017-08-31 MED ORDER — FENTANYL CITRATE (PF) 100 MCG/2ML IJ SOLN
INTRAMUSCULAR | Status: AC | PRN
  Administered 2017-08-31: 50 ug via INTRAVENOUS

## 2017-08-31 MED ORDER — MIDAZOLAM HCL 5 MG/5ML IJ SOLN
INTRAMUSCULAR | Status: AC | PRN
  Administered 2017-08-31: 1 mg via INTRAVENOUS

## 2017-08-31 NOTE — H&P (Signed)
Chief Complaint: Patient was seen in consultation today for No chief complaint on file.  at the request of Finnegan,Timothy J  Referring Physician(s): Finnegan,Timothy J  Supervising Physician: Marybelle Killings  Patient Status: [facility] - Out-pt  History of Present Illness: [name] is a 75 y.o. male with pancytopenia. BM biopsy is requested.  Past Medical History:  Diagnosis Date  . Arthritis   . Diabetes mellitus   . Dyspnea   . 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 Hypertension   . Hypothyroidism   . Neuropathy   . Peripheral vascular disease ([place])   . Venous stasis     Past Surgical History:  Procedure Laterality Date  . CATARACT EXTRACTION W/PHACO Right 10/25/2016   Procedure: CATARACT EXTRACTION PHACO AND INTRAOCULAR LENS PLACEMENT (IOC)  right eye complicated;  Surgeon: Ronnell Freshwater, MD;  Location: [place];  Service: Ophthalmology;  Laterality: Right;  Right eye Diabetic - oral meds Vision blue  . FINGER SURGERY      Allergies: Metformin and related  Medications: Prior to Admission medications   Medication Sig Start Date End Date Taking? Authorizing Provider  aspirin EC 81 MG tablet Take 81 mg by mouth daily.   Yes [provider]  empagliflozin (JARDIANCE) 10 MG TABS tablet Take 10 mg by mouth daily.   Yes [provider]  ibuprofen (ADVIL,MOTRIN) 100 MG tablet Take 100 mg by mouth every 6 (six) hours as needed for fever.   Yes [provider]  levothyroxine (SYNTHROID, LEVOTHROID) 75 MCG tablet Take 1 tablet (75 mcg total) daily before breakfast by mouth. 08/16/17  Yes Johnson, Megan P, DO  lisinopril (PRINIVIL,ZESTRIL) 5 MG tablet Take 1 tablet (5 mg total) by mouth daily. 03/30/17  Yes Johnson, Megan P, DO  diclofenac sodium (VOLTAREN) 1 % GEL Apply 4 g 4 (four) times daily topically. 08/16/17   Valerie Roys, DO     Family History  Problem Relation Age of Onset    . Heart disease Mother   . Alzheimer's disease Sister   . Heart disease Maternal Aunt     Social History   Socioeconomic History  . Marital status: Married    Spouse name: None  . Number of children: 2  . Years of education: None  . Highest education level: None  Social Needs  . Financial resource strain: None  . Food insecurity - worry: None  . Food insecurity - inability: None  . Transportation needs - medical: None  . Transportation needs - non-medical: None  Occupational History  . Occupation: Custodian  Tobacco Use  . Smoking status: Former Smoker    Packs/day: 0.25    Types: Cigarettes    Last attempt to quit: 03/23/2015    Years since quitting: 2.4  . Smokeless tobacco: Current User    Types: Chew  Substance and Sexual Activity  . Alcohol use: Yes    Alcohol/week: 25.2 oz    Types: 42 Cans of beer per week    Comment: 5-6 every day  . Drug use: No  . Sexual activity: None    Comment: currently trying to quit now  Other Topics Concern  . None  Social History Narrative   Lives at home with son, ambulates with a cane     Review of Systems: A 12 point ROS discussed and pertinent positives are indicated in the HPI above.  All other systems are negative.  Review of  Systems    MP I   Vital Signs: BP (!) 146/50   Pulse 87   Temp 98.1 F (36.7 C) (Oral)   Resp (!) 22   SpO2 97%   Physical Exam  Constitutional: He appears well-developed and well-nourished.  HENT:  Head: Normocephalic and atraumatic.  Cardiovascular: Normal rate and regular rhythm.  Pulmonary/Chest: Effort normal and breath sounds normal.  Neurological: He is alert.  Disoriented to year. Oriented to person, place, situation.    Imaging: No results found.  Labs:  CBC: Recent Labs    07/29/17 0950 08/09/17 0858 08/16/17 1307 08/18/17 1308 08/31/17 0752  WBC 3.1*  --  4.1 4.2 3.7*  HGB 7.2* 7.1* 7.2* 7.5* 8.3*  HCT 20.3* 21.1* 21.3* 21.7* 24.1*  PLT 87*  --  94* 108*  109*    COAGS: Recent Labs    08/09/17 1000  INR 1.16    BMP: Recent Labs    06/21/17 0532 07/01/17 1022 07/29/17 0950 08/09/17 0858  NA 121* 129* 135 138  K 4.7 4.1 4.8 4.2  CL 90* 92* 102 107  CO2 _0 GLUCOSE 127* 142* 126* 124*  BUN 23* 44* 13 10  CALCIUM 8.5* 8.8 8.5* 8.4*  CREATININE 1.02 1.28* 1.30* 1.00  GFRNONAA >60 54* 53* >60  GFRAA >60 62 61 >60    LIVER FUNCTION TESTS: Recent Labs    06/20/17 0829 07/01/17 1022 07/29/17 0950 08/09/17 1000  BILITOT 3.4* 1.3* 0.6 0.9  AST 107* 89* 36 36  ALT 51 56* 25 21  ALKPHOS 289* 268* 189* 198*  PROT 6.9 5.4* 4.9* 5.4*  ALBUMIN 4.0 3.4* 3.2* 3.0*    TUMOR MARKERS: No results for input(s): AFPTM, CEA, CA199, CHROMGRNA in the last 8760 hours.  Assessment and Plan:  Pancytopenia. BM Bx to follow.   Electronically Signed: Daryl Beehler, ART A, MD 08/31/2017, 8:21 AM   I spent a total of  30 Minutes   in face to face in clinical consultation, greater than 50% of which was counseling/coordinating care for bone marrow biopsy.

## 2017-08-31 NOTE — Progress Notes (Signed)
Pt eating and drinking w/o difficulty sitting up in bed.  Discharge and follow up instructions reviewed with pt and family who verbalize understanding.  Pt ambulatory to the bathroom w/o difficulty.

## 2017-08-31 NOTE — Procedures (Signed)
BM aspirate and core R iliac EBL 0 Comp 0

## 2017-09-06 NOTE — Progress Notes (Signed)
[place]  Telephone:(336) [phone] Fax:(336) [phone]  ID: [name] OB: Nov 08, 1940  MR#: [id number]  EYC#:[id number]  Patient Care Team: Valerie Roys, DO as PCP - General (Family Medicine)  CHIEF COMPLAINT: Multiple myeloma.  INTERVAL HISTORY: Patient returns to clinic for further evaluation and discussion of his bone marrow biopsy results.  He currently feels well and is asymptomatic. He denies any fevers or illnesses. He denies any easy bleeding or bruising. He denies any weakness or fatigue. He denies any chest pain or shortness of breath. He has no nausea, vomiting, constipation, or diarrhea. He has no urinary complaints. Patient feels at his baseline and offers no specific complaints today.  REVIEW OF SYSTEMS:   Review of Systems  Constitutional: Negative.  Negative for fever and malaise/fatigue.  Respiratory: Negative.  Negative for cough and shortness of breath.   Cardiovascular: Negative.  Negative for chest pain and leg swelling.  Gastrointestinal: Negative.  Negative for abdominal pain, blood in stool and melena.  Musculoskeletal: Negative.   Neurological: Negative.  Negative for sensory change and weakness.  Endo/Heme/Allergies: Does not bruise/bleed easily.  Psychiatric/Behavioral: Positive for substance abuse. The patient is not nervous/anxious.     As per HPI. Otherwise, a complete review of systems is negative.  PAST MEDICAL HISTORY: Past Medical History:  Diagnosis Date  . Arthritis   . Diabetes mellitus   . Dyspnea   . 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 Hypertension   . Hypothyroidism   . Neuropathy   . Peripheral vascular disease ([place])   . Venous stasis     PAST SURGICAL HISTORY: Past Surgical History:  Procedure Laterality Date  . CATARACT EXTRACTION W/PHACO Right 10/25/2016   Procedure: CATARACT EXTRACTION PHACO AND INTRAOCULAR LENS PLACEMENT (IOC)  right eye complicated;  Surgeon:  Ronnell Freshwater, MD;  Location: [place];  Service: Ophthalmology;  Laterality: Right;  Right eye Diabetic - oral meds Vision blue  . FINGER SURGERY      FAMILY HISTORY Family History  Problem Relation Age of Onset  . Heart disease Mother   . Alzheimer's disease Sister   . Heart disease Maternal Aunt        ADVANCED DIRECTIVES:    HEALTH MAINTENANCE: Social History   Tobacco Use  . Smoking status: Former Smoker    Packs/day: 0.25    Types: Cigarettes    Last attempt to quit: 03/23/2015    Years since quitting: 2.4  . Smokeless tobacco: Current User    Types: Chew  Substance Use Topics  . Alcohol use: Yes    Alcohol/week: 25.2 oz    Types: 42 Cans of beer per week    Comment: 5-6 every day  . Drug use: No     Colonoscopy:  PAP:  Bone density:  Lipid panel:  Allergies  Allergen Reactions  . Metformin And Related Nausea And Vomiting    Current Outpatient Medications  Medication Sig Dispense Refill  . aspirin EC 81 MG tablet Take 81 mg by mouth daily.    . diclofenac sodium (VOLTAREN) 1 % GEL Apply 4 g 4 (four) times daily topically. 100 g 12  . empagliflozin (JARDIANCE) 10 MG TABS tablet Take 10 mg by mouth daily.    Marland Kitchen ibuprofen (ADVIL,MOTRIN) 100 MG tablet Take 100 mg by mouth every 6 (six) hours as needed for fever.    . levothyroxine (SYNTHROID, LEVOTHROID) 75 MCG tablet Take 1 tablet (75  mcg total) daily before breakfast by mouth. 90 tablet 3  . lisinopril (PRINIVIL,ZESTRIL) 5 MG tablet Take 1 tablet (5 mg total) by mouth daily. 90 tablet 1   No current facility-administered medications for this visit.     OBJECTIVE: Vitals:   09/08/17 1145  BP: (!) 139/54  Pulse: 72  Resp: 18  Temp: (!) 96.5 F (35.8 C)     Body mass index is 26.14 kg/m.    ECOG FS:0 - Asymptomatic  General: Well-developed, well-nourished, no acute distress. Eyes: Pink conjunctiva, anicteric sclera. Lungs: Clear to auscultation bilaterally. Heart: Regular  rate and rhythm. No rubs, murmurs, or gallops. Abdomen: Soft, nontender, nondistended. No organomegaly noted, normoactive bowel sounds. Musculoskeletal: No edema, cyanosis, or clubbing. Neuro: Alert, answering all questions appropriately. Cranial nerves grossly intact. Skin: No rashes or petechiae noted. Psych: Normal affect.   LAB RESULTS:  Lab Results  Component Value Date   NA 137 09/08/2017   K 4.1 09/08/2017   CL 103 09/08/2017   CO2 25 09/08/2017   GLUCOSE 142 (H) 09/08/2017   BUN 8 09/08/2017   CREATININE 0.67 09/08/2017   CALCIUM 8.9 09/08/2017   PROT 5.4 (L) 08/09/2017   ALBUMIN 3.0 (L) 08/09/2017   AST 36 08/09/2017   ALT 21 08/09/2017   ALKPHOS 198 (H) 08/09/2017   BILITOT 0.9 08/09/2017   GFRNONAA >60 09/08/2017   GFRAA >60 09/08/2017    Lab Results  Component Value Date   WBC 3.7 (L) 09/08/2017   NEUTROABS 2.6 09/08/2017   HGB 8.8 (L) 09/08/2017   HCT 25.3 (L) 09/08/2017   MCV 120.3 (H) 09/08/2017   PLT 91 (L) 09/08/2017     STUDIES: Ct Biopsy  Result Date: 2017/09/22 INDICATION: Pancytopenia EXAM: CT BIOPSY MEDICATIONS: None. ANESTHESIA/SEDATION: Fentanyl 50 mcg IV; Versed 1 mg IV Moderate Sedation Time:  13 The patient was continuously monitored during the procedure by the interventional radiology nurse under my direct supervision. FLUOROSCOPY TIME:  Fluoroscopy Time:  minutes  seconds ( mGy). COMPLICATIONS: None immediate. PROCEDURE: Informed written consent was obtained from the patient after a thorough discussion of the procedural risks, benefits and alternatives. All questions were addressed. Maximal Sterile Barrier Technique was utilized including caps, mask, sterile gowns, sterile gloves, sterile drape, hand hygiene and skin antiseptic. A timeout was performed prior to the initiation of the procedure. Under CT guidance, a(n) 11 gauge guide needle was advanced into the right iliac bone. Aspirates and a core were obtained. Post biopsy images were  obtained. Patient tolerated the procedure well without complication. Vital sign monitoring by nursing staff during the procedure will continue as patient is in the special procedures unit for post procedure observation. FINDINGS: The images document guide needle placement within the right iliac bone. Post biopsy images demonstrate no hemorrhage. IMPRESSION: Successful CT-guided right iliac bone marrow aspirate and core. Electronically Signed   By: Marybelle Killings M.D.   On: 2017/09/22 10:45    ASSESSMENT: Multiple myeloma.  PLAN:  1.  Multiple myeloma: Bone marrow biopsy noted to have 29% plasma cells with kappa restriction.  Patient's kappa light chains are significantly elevated, SPEP is pending at time of dictation.  He has evidence of an organ damage with his pancytopenia, but his creatinine calcium levels are within normal limits.  Will get metastatic bone survey in the next 1-2 weeks as well.  The patient will benefit from chemotherapy, but given his alcoholism hesitant to use any oral regimen.  Patient wishes to delay treatment until after the  holidays, therefore will return to clinic in January 2019 for further evaluation and discussion of initiating the CyBorD regimen.   2. Pancytopenia: Likely secondary to underlying multiple myeloma, though his heavy alcoholism is possibly contributing.      Approximately 30 minutes was spent in discussion of which greater than 50% was consultation.  Patient expressed understanding and was in agreement with this plan. He also understands that He can call clinic at any time with any questions, concerns, or complaints.    Martin Huger, MD   09/10/2017 8:56 AM

## 2017-09-08 ENCOUNTER — Inpatient Hospital Stay (HOSPITAL_BASED_OUTPATIENT_CLINIC_OR_DEPARTMENT_OTHER): Payer: Medicare Other | Admitting: Oncology

## 2017-09-08 ENCOUNTER — Inpatient Hospital Stay: Payer: Medicare Other

## 2017-09-08 VITALS — BP 139/54 | HR 72 | Temp 96.5°F | Resp 18 | Wt 177.0 lb

## 2017-09-08 DIAGNOSIS — Z7982 Long term (current) use of aspirin: Secondary | ICD-10-CM

## 2017-09-08 DIAGNOSIS — Z87891 Personal history of nicotine dependence: Secondary | ICD-10-CM

## 2017-09-08 DIAGNOSIS — C9000 Multiple myeloma not having achieved remission: Secondary | ICD-10-CM | POA: Diagnosis not present

## 2017-09-08 DIAGNOSIS — F1910 Other psychoactive substance abuse, uncomplicated: Secondary | ICD-10-CM | POA: Diagnosis not present

## 2017-09-08 DIAGNOSIS — E1151 Type 2 diabetes mellitus with diabetic peripheral angiopathy without gangrene: Secondary | ICD-10-CM | POA: Diagnosis not present

## 2017-09-08 DIAGNOSIS — D61818 Other pancytopenia: Secondary | ICD-10-CM

## 2017-09-08 DIAGNOSIS — I10 Essential (primary) hypertension: Secondary | ICD-10-CM | POA: Diagnosis not present

## 2017-09-08 DIAGNOSIS — E1140 Type 2 diabetes mellitus with diabetic neuropathy, unspecified: Secondary | ICD-10-CM | POA: Diagnosis not present

## 2017-09-08 DIAGNOSIS — E039 Hypothyroidism, unspecified: Secondary | ICD-10-CM

## 2017-09-08 DIAGNOSIS — Z7189 Other specified counseling: Secondary | ICD-10-CM

## 2017-09-08 DIAGNOSIS — Z79899 Other long term (current) drug therapy: Secondary | ICD-10-CM | POA: Diagnosis not present

## 2017-09-08 DIAGNOSIS — D72819 Decreased white blood cell count, unspecified: Secondary | ICD-10-CM

## 2017-09-08 DIAGNOSIS — F1010 Alcohol abuse, uncomplicated: Secondary | ICD-10-CM | POA: Diagnosis not present

## 2017-09-08 HISTORY — DX: Other specified counseling: Z71.89

## 2017-09-08 LAB — CBC WITH DIFFERENTIAL/PLATELET
BASOS ABS: 0 10*3/uL (ref 0–0.1)
BASOS PCT: 1 %
Eosinophils Absolute: 0 10*3/uL (ref 0–0.7)
Eosinophils Relative: 1 %
HEMATOCRIT: 25.3 % — AB (ref 40.0–52.0)
HEMOGLOBIN: 8.8 g/dL — AB (ref 13.0–18.0)
LYMPHS PCT: 20 %
Lymphs Abs: 0.7 10*3/uL — ABNORMAL LOW (ref 1.0–3.6)
MCH: 42 pg — ABNORMAL HIGH (ref 26.0–34.0)
MCHC: 34.9 g/dL (ref 32.0–36.0)
MCV: 120.3 fL — ABNORMAL HIGH (ref 80.0–100.0)
MONO ABS: 0.3 10*3/uL (ref 0.2–1.0)
MONOS PCT: 8 %
NEUTROS ABS: 2.6 10*3/uL (ref 1.4–6.5)
Neutrophils Relative %: 70 %
Platelets: 91 10*3/uL — ABNORMAL LOW (ref 150–440)
RBC: 2.1 MIL/uL — ABNORMAL LOW (ref 4.40–5.90)
RDW: 14.8 % — ABNORMAL HIGH (ref 11.5–14.5)
WBC: 3.7 10*3/uL — ABNORMAL LOW (ref 3.8–10.6)

## 2017-09-08 LAB — BASIC METABOLIC PANEL
ANION GAP: 9 (ref 5–15)
BUN: 8 mg/dL (ref 6–20)
CHLORIDE: 103 mmol/L (ref 101–111)
CO2: 25 mmol/L (ref 22–32)
Calcium: 8.9 mg/dL (ref 8.9–10.3)
Creatinine, Ser: 0.67 mg/dL (ref 0.61–1.24)
GFR calc non Af Amer: >60 mL/min (ref >60)
Glucose, Bld: 142 mg/dL — ABNORMAL HIGH (ref 65–99)
POTASSIUM: 4.1 mmol/L (ref 3.5–5.1)
Sodium: 137 mmol/L (ref 135–145)

## 2017-09-08 NOTE — Progress Notes (Signed)
START ON PATHWAY REGIMEN - Multiple Myeloma and Other Plasma Cell Dyscrasias     A cycle is every 21 days:     Bortezomib      Cyclophosphamide      Dexamethasone   **Always confirm dose/schedule in your pharmacy ordering system**    Patient Characteristics: Newly Diagnosed, Transplant Ineligible or Refused, Unknown or Awaiting Test Results R-ISS Staging: Unknown Disease Classification: Newly Diagnosed Is Patient Eligible for Transplant<= Transplant Ineligible or Refused Risk Status: Awaiting Test Results Intent of Therapy: Non-Curative / Palliative Intent, Discussed with Patient

## 2017-09-09 LAB — IGG, IGA, IGM
IGA: 29 mg/dL — AB (ref 61–437)
IGG (IMMUNOGLOBIN G), SERUM: 774 mg/dL (ref 700–1600)
IgM (Immunoglobulin M), Srm: 31 mg/dL (ref 15–143)

## 2017-09-09 LAB — KAPPA/LAMBDA LIGHT CHAINS
KAPPA FREE LGHT CHN: 2764.4 mg/L — AB (ref 3.3–19.4)
KAPPA, LAMDA LIGHT CHAIN RATIO: 329.1 — AB (ref 0.26–1.65)
LAMDA FREE LIGHT CHAINS: 8.4 mg/L (ref 5.7–26.3)

## 2017-09-12 ENCOUNTER — Ambulatory Visit (INDEPENDENT_AMBULATORY_CARE_PROVIDER_SITE_OTHER): Payer: Medicare Other | Admitting: Vascular Surgery

## 2017-09-12 LAB — PROTEIN ELECTROPHORESIS, SERUM
A/G Ratio: 1.4 (ref 0.7–1.7)
ALBUMIN ELP: 3.3 g/dL (ref 2.9–4.4)
ALPHA-1-GLOBULIN: 0.3 g/dL (ref 0.0–0.4)
ALPHA-2-GLOBULIN: 0.5 g/dL (ref 0.4–1.0)
BETA GLOBULIN: 0.8 g/dL (ref 0.7–1.3)
GAMMA GLOBULIN: 0.8 g/dL (ref 0.4–1.8)
Globulin, Total: 2.3 g/dL (ref 2.2–3.9)
Total Protein ELP: 5.6 g/dL — ABNORMAL LOW (ref 6.0–8.5)

## 2017-09-13 ENCOUNTER — Ambulatory Visit: Payer: Medicare Other | Attending: Oncology

## 2017-09-13 ENCOUNTER — Other Ambulatory Visit: Payer: Medicare Other

## 2017-09-19 ENCOUNTER — Ambulatory Visit: Payer: Medicare Other | Admitting: Oncology

## 2017-09-19 ENCOUNTER — Other Ambulatory Visit: Payer: Medicare Other

## 2017-09-19 ENCOUNTER — Ambulatory Visit: Payer: Medicare Other

## 2017-09-20 ENCOUNTER — Telehealth: Payer: Self-pay | Admitting: Family Medicine

## 2017-09-20 DIAGNOSIS — I129 Hypertensive chronic kidney disease with stage 1 through stage 4 chronic kidney disease, or unspecified chronic kidney disease: Secondary | ICD-10-CM

## 2017-09-20 MED ORDER — EMPAGLIFLOZIN 10 MG PO TABS: 10.0000 mg | ORAL_TABLET | Freq: Every day | ORAL | 1 refills | Status: DC

## 2017-09-20 MED ORDER — LISINOPRIL 5 MG PO TABS: 5.0000 mg | ORAL_TABLET | Freq: Every day | ORAL | 1 refills | Status: DC

## 2017-09-20 NOTE — Telephone Encounter (Signed)
Dr.Johnson, do you write this patients diabetic medication?

## 2017-09-20 NOTE — Telephone Encounter (Deleted)
Hey Dr. Wynetta Emery, Spoke with Dehaven Sine CMA on matter as well regarding pt. Only available slots open are 11:15 this year.

## 2017-09-20 NOTE — Telephone Encounter (Signed)
Copied from [place] [id number]. Topic: Appointment Scheduling - Scheduling Inquiry for Clinic >> Sep 20, 2017 11:26 AM [name] wrote: Pt called and states he had to cancel for tomorrow for a a1c1 check. I couldn't get the patient in for the rest of the year. He would like a call back when he could be worked in before end of year

## 2017-09-20 NOTE — Telephone Encounter (Signed)
Jardiance and lisinopril sent to [facility]

## 2017-09-20 NOTE — Telephone Encounter (Signed)
Pt reschedule for 09/26/2017 at 11:15 am due to weather this week and unable to get out of home.   Pt. Also requesting refill on diabetic medication through his insurance company. Pt. Stated when medication is ordered through his insurance they help him to cover it. Pt. Was unsure of which diabetic medication but states that he is out of one and almost out of the others.  Please Advise.  Thank you

## 2017-09-21 ENCOUNTER — Ambulatory Visit: Payer: Medicare Other | Admitting: Family Medicine

## 2017-09-21 ENCOUNTER — Encounter (HOSPITAL_COMMUNITY): Payer: Self-pay

## 2017-09-21 LAB — TISSUE HYBRIDIZATION (BONE MARROW)-NCBH

## 2017-09-21 LAB — CHROMOSOME ANALYSIS, BONE MARROW

## 2017-09-26 ENCOUNTER — Ambulatory Visit: Payer: Medicare Other | Admitting: Family Medicine

## 2017-09-26 ENCOUNTER — Encounter: Payer: Self-pay | Admitting: Family Medicine

## 2017-09-26 ENCOUNTER — Ambulatory Visit (INDEPENDENT_AMBULATORY_CARE_PROVIDER_SITE_OTHER): Payer: Medicare Other | Admitting: Family Medicine

## 2017-09-26 VITALS — BP 122/57 | HR 76 | Temp 97.7°F | Wt 180.1 lb

## 2017-09-26 DIAGNOSIS — G6289 Other specified polyneuropathies: Secondary | ICD-10-CM | POA: Diagnosis not present

## 2017-09-26 DIAGNOSIS — IMO0002 Reserved for concepts with insufficient information to code with codable children: Secondary | ICD-10-CM

## 2017-09-26 DIAGNOSIS — E1140 Type 2 diabetes mellitus with diabetic neuropathy, unspecified: Secondary | ICD-10-CM

## 2017-09-26 DIAGNOSIS — E538 Deficiency of other specified B group vitamins: Secondary | ICD-10-CM | POA: Diagnosis not present

## 2017-09-26 DIAGNOSIS — E1165 Type 2 diabetes mellitus with hyperglycemia: Secondary | ICD-10-CM

## 2017-09-26 LAB — BAYER DCA HB A1C WAIVED: HB A1C: 4.8 % (ref <7.0)

## 2017-09-26 MED ORDER — CYANOCOBALAMIN 1000 MCG/ML IJ SOLN
1000.0000 ug | INTRAMUSCULAR | Status: AC
  Administered 2017-09-26 – 2018-02-24 (×3): 1000 ug via INTRAMUSCULAR

## 2017-09-26 NOTE — Assessment & Plan Note (Signed)
B12 injection given today. Will keep him on monthly schedule. Recheck 2 weeks with CBC and B12 levels.

## 2017-09-26 NOTE — Progress Notes (Signed)
BP (!) 122/57 (BP Location: Left Arm, Patient Position: Sitting, Cuff Size: Normal)   Pulse 76   Temp 97.7 F (36.5 C)   Wt 180 lb 2 oz (81.7 kg)   SpO2 95%   BMI 26.60 kg/m    Subjective:    Patient ID: [name], male    DOB: 21-Oct-1940, 76 y.o.   MRN: [id number]  HPI: [name] is a 76 y.o. male  Chief Complaint  Patient presents with  . Diabetes    Please send prescriptions to [facility]  . Other    Patient states that he went to have a procedure on his leg for the clot and they could not do it because of a low blood count, patient is unsure of how to fix that   Has been feeling tired. Having some numbness in both his hands for the past couple of days- has been coming on for a long time- but seems to have gotten worse over the past couple of days. Was diagnosed with B12 deficiency, but has not been on anything for it. Has been seeing hematology.   DIABETES Hypoglycemic episodes:no Polydipsia/polyuria: no Visual disturbance: no Chest pain: no Paresthesias: yes Glucose Monitoring: no  Accucheck frequency: Not Checking Taking Insulin?: no Blood Pressure Monitoring: not checking Retinal Examination: Up to Date Foot Exam: Up to Date Diabetic Education: Completed Pneumovax: Up to Date Influenza: Up to Date Aspirin: no  Relevant past medical, surgical, family and social history reviewed and updated as indicated. Interim medical history since our last visit reviewed. Allergies and medications reviewed and updated.  Review of Systems  Constitutional: Positive for fatigue. Negative for activity change, appetite change, chills, diaphoresis, fever and unexpected weight change.  Respiratory: Negative.   Cardiovascular: Negative.   Neurological: Positive for weakness and numbness. Negative for dizziness, tremors, seizures, syncope, facial asymmetry, speech difficulty, light-headedness and headaches.  Hematological: Negative.   Psychiatric/Behavioral: Negative.      Per HPI unless specifically indicated above     Objective:    BP (!) 122/57 (BP Location: Left Arm, Patient Position: Sitting, Cuff Size: Normal)   Pulse 76   Temp 97.7 F (36.5 C)   Wt 180 lb 2 oz (81.7 kg)   SpO2 95%   BMI 26.60 kg/m   Wt Readings from Last 3 Encounters:  09/26/17 180 lb 2 oz (81.7 kg)  09/08/17 177 lb (80.3 kg)  08/18/17 185 lb 8 oz (84.1 kg)    Physical Exam  Constitutional: He is oriented to person, place, and time. He appears well-developed and well-nourished. No distress.  HENT:  Head: Normocephalic and atraumatic.  Right Ear: Hearing normal.  Left Ear: Hearing normal.  Nose: Nose normal.  Eyes: Conjunctivae and lids are normal. Right eye exhibits no discharge. Left eye exhibits no discharge. No scleral icterus.  Cardiovascular: Normal rate, regular rhythm, normal heart sounds and intact distal pulses. Exam reveals no gallop and no friction rub.  No murmur heard. Pulmonary/Chest: Effort normal and breath sounds normal. No respiratory distress. He has no wheezes. He has no rales. He exhibits no tenderness.  Musculoskeletal: Normal range of motion.  Neurological: He is alert and oriented to person, place, and time.  Skin: Skin is warm, dry and intact. No rash noted. He is not diaphoretic. No erythema. No pallor.  Psychiatric: He has a normal mood and affect. His speech is normal and behavior is normal. Judgment and thought content normal. Cognition and memory are normal.  Nursing note and  vitals reviewed.   Results for orders placed or performed in visit on 09/08/17  CBC with Differential  Result Value Ref Range   WBC 3.7 (L) 3.8 - 10.6 K/uL   RBC 2.10 (L) 4.40 - 5.90 MIL/uL   Hemoglobin 8.8 (L) 13.0 - 18.0 g/dL   HCT 25.3 (L) 40.0 - 52.0 %   MCV 120.3 (H) 80.0 - 100.0 fL   MCH 42.0 (H) 26.0 - 34.0 pg   MCHC 34.9 32.0 - 36.0 g/dL   RDW 14.8 (H) 11.5 - 14.5 %   Platelets 91 (L) 150 - 440 K/uL   Neutrophils Relative % 70 %   Neutro Abs 2.6  1.4 - 6.5 K/uL   Lymphocytes Relative 20 %   Lymphs Abs 0.7 (L) 1.0 - 3.6 K/uL   Monocytes Relative 8 %   Monocytes Absolute 0.3 0.2 - 1.0 K/uL   Eosinophils Relative 1 %   Eosinophils Absolute 0.0 0 - 0.7 K/uL   Basophils Relative 1 %   Basophils Absolute 0.0 0 - 0.1 K/uL  Protein electrophoresis, serum  Result Value Ref Range   Total Protein ELP 5.6 (L) 6.0 - 8.5 g/dL   Albumin ELP 3.3 2.9 - 4.4 g/dL   Alpha-1-Globulin 0.3 0.0 - 0.4 g/dL   Alpha-2-Globulin 0.5 0.4 - 1.0 g/dL   Beta Globulin 0.8 0.7 - 1.3 g/dL   Gamma Globulin 0.8 0.4 - 1.8 g/dL   M-Spike, % Not Observed Not Observed g/dL   SPE Interp. Comment    Comment Comment    GLOBULIN, TOTAL 2.3 2.2 - 3.9 g/dL   A/G Ratio 1.4 0.7 - 1.7  IgG, IgA, IgM  Result Value Ref Range   IgG (Immunoglobin G), Serum 774 700 - 1,600 mg/dL   IgA 29 (L) 61 - 437 mg/dL   IgM (Immunoglobulin M), Srm 31 15 - 143 mg/dL  Kappa/lambda light chains  Result Value Ref Range   Kappa free light chain 2,764.4 (H) 3.3 - 19.4 mg/L   Lamda free light chains 8.4 5.7 - 26.3 mg/L   Kappa, lamda light chain ratio 329.10 (H) 0.26 - 5.62  Basic metabolic panel  Result Value Ref Range   Sodium 137 135 - 145 mmol/L   Potassium 4.1 3.5 - 5.1 mmol/L   Chloride 103 101 - 111 mmol/L   CO2 25 22 - 32 mmol/L   Glucose, Bld 142 (H) 65 - 99 mg/dL   BUN 8 6 - 20 mg/dL   Creatinine, Ser 0.67 0.61 - 1.24 mg/dL   Calcium 8.9 8.9 - 10.3 mg/dL   GFR calc non Af Amer >60 >60 mL/min   GFR calc Af Amer >60 >60 mL/min   Anion gap 9 5 - 15      Assessment & Plan:   Problem List Items Addressed This Visit      Endocrine   Type 2 diabetes, uncontrolled, with neuropathy (HCC) - Primary    A1c 4.8. Will stop jardiance for concerns about hypoglycemia and continue to monitor. Recheck sugar next visit and A1c in 3 months.       Relevant Orders   Bayer DCA Hb A1c Waived     Nervous and Auditory   Peripheral neuropathy    Worse today. Possibly exacerbated by his  B12 deficiency. Will get him on shots and recheck levels next visit. Call if not getting better or getting worse.         Other   B12 deficiency    B12 injection  given today. Will keep him on monthly schedule. Recheck 2 weeks with CBC and B12 levels.       Relevant Medications   cyanocobalamin ((VITAMIN B-12)) injection 1,000 mcg       Follow up plan: Return 2 weeks, for follow up.

## 2017-09-26 NOTE — Assessment & Plan Note (Signed)
A1c 4.8. Will stop jardiance for concerns about hypoglycemia and continue to monitor. Recheck sugar next visit and A1c in 3 months.

## 2017-09-26 NOTE — Assessment & Plan Note (Signed)
Worse today. Possibly exacerbated by his B12 deficiency. Will get him on shots and recheck levels next visit. Call if not getting better or getting worse.

## 2017-09-30 ENCOUNTER — Other Ambulatory Visit: Payer: Self-pay | Admitting: Family Medicine

## 2017-09-30 NOTE — Telephone Encounter (Signed)
Copied from [place] [id number]. Topic: Quick Communication - Rx Refill/Question >> Sep 30, 2017 11:50 AM Marin Olp L wrote: Has the patient contacted their pharmacy? yes. (Agent: If no, request that the patient contact the pharmacy for the refill.) Preferred Pharmacy (with phone number or street name): [facility], [place]: Please be advised that RX refills may take up to 3 business days. We ask that you follow-up with your pharmacy. Patient would like a call back to discuss which medication he needs refilled. He's not sure what it's called and can not read or write.

## 2017-09-30 NOTE — Telephone Encounter (Signed)
Spoke with pt. Both of his medications have been refilled. Verbalizes understanding.

## 2017-10-03 LAB — CBC WITH DIFFERENTIAL/PLATELET
HEMATOCRIT: 25.6 % — AB (ref 37.5–51.0)
HEMATOCRIT: 21.3 % — AB (ref 37.5–51.0)
HEMOGLOBIN: 9.6 g/dL — AB (ref 13.0–17.7)
Hemoglobin: 7.2 g/dL — ABNORMAL LOW (ref 13.0–17.7)
LYMPHS ABS: 0.8 10*3/uL (ref 0.7–3.1)
LYMPHS: 19 %
Lymphocytes Absolute: 0.4 10*3/uL — ABNORMAL LOW (ref 0.7–3.1)
Lymphs: 7 %
MCH: 41.2 pg — AB (ref 26.6–33.0)
MCH: 40 pg — ABNORMAL HIGH (ref 26.6–33.0)
MCHC: 37.5 g/dL — ABNORMAL HIGH (ref 31.5–35.7)
MCHC: 33.8 g/dL (ref 31.5–35.7)
MCV: 110 fL — AB (ref 79–97)
MCV: 118 fL — ABNORMAL HIGH (ref 79–97)
MID (Absolute): 0.4 10*3/uL (ref 0.1–1.6)
MID (Absolute): 0.7 10*3/uL (ref 0.1–1.6)
MID: 10 %
MID: 9 %
NEUTROS ABS: 2.9 10*3/uL (ref 1.4–7.0)
NEUTROS PCT: 72 %
Neutrophils Absolute: 5.5 10*3/uL (ref 1.4–7.0)
Neutrophils: 83 %
Platelets: 117 10*3/uL — ABNORMAL LOW (ref 150–379)
Platelets: 94 10*3/uL — CL (ref 150–379)
RBC: 1.8 x10E6/uL — CL (ref 4.14–5.80)
RBC: 2.33 x10E6/uL — CL (ref 4.14–5.80)
RDW: 12.4 % (ref 12.3–15.4)
RDW: 14.7 % (ref 12.3–15.4)
WBC: 6.6 10*3/uL (ref 3.4–10.8)
WBC: 4.1 10*3/uL (ref 3.4–10.8)

## 2017-10-10 ENCOUNTER — Other Ambulatory Visit: Payer: Self-pay | Admitting: Oncology

## 2017-10-12 NOTE — Progress Notes (Signed)
[facility]  [phone] [phone]  ID: CRUE OTERO OB: [birth year]  MR#: [id number]  OTR#:[id number]  Patient Care Team: Valerie Roys, DO as PCP - General (Family Medicine)  CHIEF COMPLAINT: Multiple myeloma.  INTERVAL HISTORY: Patient returns to clinic for further evaluation and treatment planning.  Patient complains of increased weakness and fatigue particularly in his lower extremities and now requires a walker for stability.  He reports occasional dizziness as well. He denies any fevers or illnesses. He denies any easy bleeding or bruising. He denies any chest pain or shortness of breath. He has no nausea, vomiting, constipation, or diarrhea. He has no urinary complaints. Patient offers no further specific complaints today.  REVIEW OF SYSTEMS:   Review of Systems  Constitutional: Positive for malaise/fatigue. Negative for fever.  Respiratory: Negative.  Negative for cough and shortness of breath.   Cardiovascular: Negative.  Negative for chest pain and leg swelling.  Gastrointestinal: Negative.  Negative for abdominal pain, blood in stool and melena.  Musculoskeletal: Negative.   Skin: Negative.  Negative for rash.  Neurological: Positive for dizziness and weakness. Negative for sensory change.  Endo/Heme/Allergies: Does not bruise/bleed easily.  Psychiatric/Behavioral: Positive for substance abuse. The patient is not nervous/anxious.     As per HPI. Otherwise, a complete review of systems is negative.  PAST MEDICAL HISTORY: Past Medical History:  Diagnosis Date  . Arthritis   . Diabetes mellitus   . Dyspnea   . 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 Hypertension   . Hypothyroidism   . Neuropathy   . Peripheral vascular disease ([place])   . Venous stasis     PAST SURGICAL HISTORY: Past Surgical History:  Procedure Laterality Date  . CATARACT EXTRACTION W/PHACO Right 10/25/2016   Procedure: CATARACT EXTRACTION PHACO AND INTRAOCULAR LENS PLACEMENT (IOC)  right eye complicated;  Surgeon: Ronnell Freshwater, MD;  Location: [facility];  Service: Ophthalmology;  Laterality: Right;  Right eye Diabetic - oral meds Vision blue  . FINGER SURGERY      FAMILY HISTORY Family History  Problem Relation Age of Onset  . Heart disease Mother   . Alzheimer's disease Sister   . Heart disease Maternal Aunt        ADVANCED DIRECTIVES:    HEALTH MAINTENANCE: Social History   Tobacco Use  . Smoking status: Former Smoker    Packs/day: 0.25    Types: Cigarettes    Last attempt to quit: 03/23/2015    Years since quitting: 2.5  . Smokeless tobacco: Current User    Types: Chew  Substance Use Topics  . Alcohol use: Yes    Alcohol/week: 25.2 oz    Types: 42 Cans of beer per week    Comment: 5-6 every day  . Drug use: No     Colonoscopy:  PAP:  Bone density:  Lipid panel:  Allergies  Allergen Reactions  . Metformin And Related Nausea And Vomiting    Current Outpatient Medications  Medication Sig Dispense Refill  . levothyroxine (SYNTHROID, LEVOTHROID) 75 MCG tablet Take 1 tablet (75 mcg total) daily before breakfast by mouth. 90 tablet 3  . lisinopril (PRINIVIL,ZESTRIL) 5 MG tablet Take 1 tablet (5 mg total) by mouth daily. 90 tablet 1   Current Facility-Administered Medications  Medication Dose Route Frequency Provider Last Rate Last Dose  . cyanocobalamin ((VITAMIN B-12)) injection 1,000 mcg  1,000 mcg Intramuscular Q30 days Johnson, Megan P, DO  1,000 mcg at 09/26/17 1205    OBJECTIVE: Vitals:   10/13/17 1207  BP: (!) 181/74  Pulse: 76  Resp: 16  Temp: 98 F (36.7 C)     Body mass index is 26.49 kg/m.    ECOG FS:0 - Asymptomatic  General: Well-developed, well-nourished, no acute distress. Eyes: Pink conjunctiva, anicteric sclera. Lungs: Clear to auscultation bilaterally. Heart: Regular rate and rhythm. No rubs, murmurs, or  gallops. Abdomen: Soft, nontender, nondistended. No organomegaly noted, normoactive bowel sounds. Musculoskeletal: No edema, cyanosis, or clubbing. Neuro: Alert, answering all questions appropriately. Cranial nerves grossly intact. Skin: No rashes or petechiae noted. Psych: Normal affect.   LAB RESULTS:  Lab Results  Component Value Date   NA 137 09/08/2017   K 4.1 09/08/2017   CL 103 09/08/2017   CO2 25 09/08/2017   GLUCOSE 142 (H) 09/08/2017   BUN 8 09/08/2017   CREATININE 0.67 09/08/2017   CALCIUM 8.9 09/08/2017   PROT 6.0 (L) 10/13/2017   ALBUMIN 3.7 10/13/2017   AST 46 (H) 10/13/2017   ALT 26 10/13/2017   ALKPHOS 121 10/13/2017   BILITOT 1.3 (H) 10/13/2017   GFRNONAA >60 09/08/2017   GFRAA >60 09/08/2017    Lab Results  Component Value Date   WBC 3.5 (L) 10/13/2017   NEUTROABS 2.7 10/13/2017   HGB 9.9 (L) 10/13/2017   HCT 28.1 (L) 10/13/2017   MCV 114.2 (H) 10/13/2017   PLT 89 (L) 10/13/2017     STUDIES: No results found.  ASSESSMENT: Multiple myeloma.  PLAN:  1.  Multiple myeloma: Bone marrow biopsy noted to have 29% plasma cells with kappa restriction.  Patient's kappa light chains are significantly elevated, although SPEP does not reveal an M spike. He has evidence of an organ damage with his pancytopenia, but his creatinine and calcium levels are within normal limits.  Patient did not go to his metastatic survey as scheduled. The patient will benefit from chemotherapy, but given his alcoholism hesitant to use any oral regimen.  Patient will receive Cytoxan on days 1 and 8 along with Velcade on days 1, 4, 8, and 11.  He will also receive treatment doses dexamethasone.  We discussed possible port placement, but patient declined at this time.  Return to clinic on October 24, 2017 to initiate cycle 1, day 1 of treatment. 2. Pancytopenia: Likely secondary to underlying multiple myeloma, though his heavy alcoholism is possibly contributing.     3.  Elevated MCV:  Patient's B12 and folate are within normal limits. 4.  Hypertension: Unclear patient's compliance, continue management and treatment per PCP.  Approximately 30 minutes was spent in discussion of which greater than 50% was consultation.  Patient expressed understanding and was in agreement with this plan. He also understands that He can call clinic at any time with any questions, concerns, or complaints.    Lloyd Huger, MD   10/14/2017 3:21 PM

## 2017-10-13 ENCOUNTER — Inpatient Hospital Stay: Payer: Medicare Other

## 2017-10-13 ENCOUNTER — Encounter: Payer: Self-pay | Admitting: Oncology

## 2017-10-13 ENCOUNTER — Other Ambulatory Visit: Payer: Self-pay

## 2017-10-13 ENCOUNTER — Inpatient Hospital Stay: Payer: Medicare Other | Attending: Oncology | Admitting: Oncology

## 2017-10-13 VITALS — BP 181/74 | HR 76 | Temp 98.0°F | Resp 16 | Ht 69.0 in | Wt 179.4 lb

## 2017-10-13 DIAGNOSIS — I739 Peripheral vascular disease, unspecified: Secondary | ICD-10-CM | POA: Diagnosis not present

## 2017-10-13 DIAGNOSIS — M79642 Pain in left hand: Secondary | ICD-10-CM | POA: Insufficient documentation

## 2017-10-13 DIAGNOSIS — M79641 Pain in right hand: Secondary | ICD-10-CM | POA: Insufficient documentation

## 2017-10-13 DIAGNOSIS — E039 Hypothyroidism, unspecified: Secondary | ICD-10-CM | POA: Diagnosis not present

## 2017-10-13 DIAGNOSIS — M1990 Unspecified osteoarthritis, unspecified site: Secondary | ICD-10-CM | POA: Insufficient documentation

## 2017-10-13 DIAGNOSIS — E119 Type 2 diabetes mellitus without complications: Secondary | ICD-10-CM | POA: Insufficient documentation

## 2017-10-13 DIAGNOSIS — F1020 Alcohol dependence, uncomplicated: Secondary | ICD-10-CM | POA: Insufficient documentation

## 2017-10-13 DIAGNOSIS — G629 Polyneuropathy, unspecified: Secondary | ICD-10-CM | POA: Insufficient documentation

## 2017-10-13 DIAGNOSIS — C9000 Multiple myeloma not having achieved remission: Secondary | ICD-10-CM | POA: Diagnosis not present

## 2017-10-13 DIAGNOSIS — R5381 Other malaise: Secondary | ICD-10-CM

## 2017-10-13 DIAGNOSIS — Z87891 Personal history of nicotine dependence: Secondary | ICD-10-CM | POA: Insufficient documentation

## 2017-10-13 DIAGNOSIS — D61818 Other pancytopenia: Secondary | ICD-10-CM | POA: Diagnosis not present

## 2017-10-13 DIAGNOSIS — R42 Dizziness and giddiness: Secondary | ICD-10-CM | POA: Insufficient documentation

## 2017-10-13 DIAGNOSIS — Z5112 Encounter for antineoplastic immunotherapy: Secondary | ICD-10-CM | POA: Insufficient documentation

## 2017-10-13 DIAGNOSIS — I10 Essential (primary) hypertension: Secondary | ICD-10-CM | POA: Diagnosis not present

## 2017-10-13 DIAGNOSIS — R5383 Other fatigue: Secondary | ICD-10-CM | POA: Diagnosis not present

## 2017-10-13 DIAGNOSIS — R531 Weakness: Secondary | ICD-10-CM | POA: Diagnosis not present

## 2017-10-13 DIAGNOSIS — Z79899 Other long term (current) drug therapy: Secondary | ICD-10-CM | POA: Diagnosis not present

## 2017-10-13 LAB — CBC WITH DIFFERENTIAL/PLATELET
BASOS PCT: 1 %
Basophils Absolute: 0 10*3/uL (ref 0–0.1)
EOS ABS: 0 10*3/uL (ref 0–0.7)
EOS PCT: 1 %
HCT: 28.1 % — ABNORMAL LOW (ref 40.0–52.0)
Hemoglobin: 9.9 g/dL — ABNORMAL LOW (ref 13.0–18.0)
Lymphocytes Relative: 20 %
Lymphs Abs: 0.7 10*3/uL — ABNORMAL LOW (ref 1.0–3.6)
MCH: 40.3 pg — AB (ref 26.0–34.0)
MCHC: 35.3 g/dL (ref 32.0–36.0)
MCV: 114.2 fL — ABNORMAL HIGH (ref 80.0–100.0)
MONOS PCT: 3 %
Monocytes Absolute: 0.1 10*3/uL — ABNORMAL LOW (ref 0.2–1.0)
NEUTROS PCT: 75 %
Neutro Abs: 2.7 10*3/uL (ref 1.4–6.5)
PLATELETS: 89 10*3/uL — AB (ref 150–440)
RBC: 2.46 MIL/uL — ABNORMAL LOW (ref 4.40–5.90)
RDW: 12.7 % (ref 11.5–14.5)
WBC: 3.5 10*3/uL — ABNORMAL LOW (ref 3.8–10.6)

## 2017-10-13 LAB — VITAMIN B12: VITAMIN B 12: 291 pg/mL (ref 180–914)

## 2017-10-13 LAB — HEPATIC FUNCTION PANEL
ALT: 26 U/L (ref 17–63)
AST: 46 U/L — AB (ref 15–41)
Albumin: 3.7 g/dL (ref 3.5–5.0)
Alkaline Phosphatase: 121 U/L (ref 38–126)
BILIRUBIN DIRECT: 0.3 mg/dL (ref 0.1–0.5)
BILIRUBIN INDIRECT: 1 mg/dL — AB (ref 0.3–0.9)
BILIRUBIN TOTAL: 1.3 mg/dL — AB (ref 0.3–1.2)
Total Protein: 6 g/dL — ABNORMAL LOW (ref 6.5–8.1)

## 2017-10-13 LAB — MAGNESIUM: MAGNESIUM: 1.6 mg/dL — AB (ref 1.7–2.4)

## 2017-10-13 LAB — SAMPLE TO BLOOD BANK

## 2017-10-13 LAB — FOLATE: Folate: 7.7 ng/mL (ref >5.9)

## 2017-10-13 NOTE — Progress Notes (Signed)
Patient here for follow up. Patient has fallen twice in the last month. He states he "just gets dizzy" when he stands up. He is also reporting numbness of his hands.

## 2017-10-14 MED ORDER — ACYCLOVIR 400 MG PO TABS: 400.0000 mg | ORAL_TABLET | Freq: Two times a day (BID) | ORAL | 3 refills | Status: DC

## 2017-10-14 MED ORDER — DEXAMETHASONE 4 MG PO TABS: ORAL_TABLET | ORAL | 3 refills | Status: DC

## 2017-10-14 MED ORDER — PROCHLORPERAZINE MALEATE 10 MG PO TABS: 10.0000 mg | ORAL_TABLET | Freq: Four times a day (QID) | ORAL | 2 refills | Status: DC | PRN

## 2017-10-14 MED ORDER — ONDANSETRON HCL 8 MG PO TABS: 8.0000 mg | ORAL_TABLET | Freq: Two times a day (BID) | ORAL | 2 refills | Status: DC | PRN

## 2017-10-17 NOTE — Patient Instructions (Signed)
Bortezomib injection What is this medicine? BORTEZOMIB (bor TEZ oh mib) is a medicine that targets proteins in cancer cells and stops the cancer cells from growing. It is used to treat multiple myeloma and mantle-cell lymphoma. This medicine may be used for other purposes; ask your health care provider or pharmacist if you have questions. COMMON BRAND NAME(S): Velcade What should I tell my health care provider before I take this medicine? They need to know if you have any of these conditions: -diabetes -heart disease -irregular heartbeat -liver disease -on hemodialysis -low blood counts, like low white blood cells, platelets, or hemoglobin -peripheral neuropathy -taking medicine for blood pressure -an unusual or allergic reaction to bortezomib, mannitol, boron, other medicines, foods, dyes, or preservatives -pregnant or trying to get pregnant -breast-feeding How should I use this medicine? This medicine is for injection into a vein or for injection under the skin. It is given by a health care professional in a hospital or clinic setting. Talk to your pediatrician regarding the use of this medicine in children. Special care may be needed. Overdosage: If you think you have taken too much of this medicine contact a poison control center or emergency room at once. NOTE: This medicine is only for you. Do not share this medicine with others. What if I miss a dose? It is important not to miss your dose. Call your doctor or health care professional if you are unable to keep an appointment. What may interact with this medicine? This medicine may interact with the following medications: -ketoconazole -rifampin -ritonavir -St. John's Wort This list may not describe all possible interactions. Give your health care provider a list of all the medicines, herbs, non-prescription drugs, or dietary supplements you use. Also tell them if you smoke, drink alcohol, or use illegal drugs. Some items may  interact with your medicine. What should I watch for while using this medicine? You may get drowsy or dizzy. Do not drive, use machinery, or do anything that needs mental alertness until you know how this medicine affects you. Do not stand or sit up quickly, especially if you are an older patient. This reduces the risk of dizzy or fainting spells. In some cases, you may be given additional medicines to help with side effects. Follow all directions for their use. Call your doctor or health care professional for advice if you get a fever, chills or sore throat, or other symptoms of a cold or flu. Do not treat yourself. This drug decreases your body's ability to fight infections. Try to avoid being around people who are sick. This medicine may increase your risk to bruise or bleed. Call your doctor or health care professional if you notice any unusual bleeding. You may need blood work done while you are taking this medicine. In some patients, this medicine may cause a serious brain infection that may cause death. If you have any problems seeing, thinking, speaking, walking, or standing, tell your doctor right away. If you cannot reach your doctor, urgently seek other source of medical care. Check with your doctor or health care professional if you get an attack of severe diarrhea, nausea and vomiting, or if you sweat a lot. The loss of too much body fluid can make it dangerous for you to take this medicine. Do not become pregnant while taking this medicine or for at least 2 months after stopping it. Women should inform their doctor if they wish to become pregnant or think they might be pregnant. Men should not  father a child while taking this medicine and for at least 2 months after stopping it. There is a potential for serious side effects to an unborn child. Talk to your health care professional or pharmacist for more information. Do not breast-feed an infant while taking this medicine or for 2 months after  stopping it. This medicine may interfere with the ability to have a child. You should talk with your doctor or health care professional if you are concerned about your fertility. What side effects may I notice from receiving this medicine? Side effects that you should report to your doctor or health care professional as soon as possible: -allergic reactions like skin rash, itching or hives, swelling of the face, lips, or tongue -breathing problems -changes in hearing -changes in vision -fast, irregular heartbeat -feeling faint or lightheaded, falls -pain, tingling, numbness in the hands or feet -right upper belly pain -seizures -swelling of the ankles, feet, hands -unusual bleeding or bruising -unusually weak or tired -vomiting -yellowing of the eyes or skin Side effects that usually do not require medical attention (report to your doctor or health care professional if they continue or are bothersome): -changes in emotions or moods -constipation -diarrhea -loss of appetite -headache -irritation at site where injected -nausea This list may not describe all possible side effects. Call your doctor for medical advice about side effects. You may report side effects to FDA at 1-800-FDA-1088. Where should I keep my medicine? This drug is given in a hospital or clinic and will not be stored at home. NOTE: This sheet is a summary. It may not cover all possible information. If you have questions about this medicine, talk to your doctor, pharmacist, or health care provider.  2018 Elsevier/Gold Standard (2016-08-26 15:53:51) Cyclophosphamide injection What is this medicine? CYCLOPHOSPHAMIDE (sye kloe FOSS fa mide) is a chemotherapy drug. It slows the growth of cancer cells. This medicine is used to treat many types of cancer like lymphoma, myeloma, leukemia, breast cancer, and ovarian cancer, to name a few. This medicine may be used for other purposes; ask your health care provider or  pharmacist if you have questions. COMMON BRAND NAME(S): Cytoxan, Neosar What should I tell my health care provider before I take this medicine? They need to know if you have any of these conditions: -blood disorders -history of other chemotherapy -infection -kidney disease -liver disease -recent or ongoing radiation therapy -tumors in the bone marrow -an unusual or allergic reaction to cyclophosphamide, other chemotherapy, other medicines, foods, dyes, or preservatives -pregnant or trying to get pregnant -breast-feeding How should I use this medicine? This drug is usually given as an injection into a vein or muscle or by infusion into a vein. It is administered in a hospital or clinic by a specially trained health care professional. Talk to your pediatrician regarding the use of this medicine in children. Special care may be needed. Overdosage: If you think you have taken too much of this medicine contact a poison control center or emergency room at once. NOTE: This medicine is only for you. Do not share this medicine with others. What if I miss a dose? It is important not to miss your dose. Call your doctor or health care professional if you are unable to keep an appointment. What may interact with this medicine? This medicine may interact with the following medications: -amiodarone -amphotericin B -azathioprine -certain antiviral medicines for HIV or AIDS such as protease inhibitors (e.g., indinavir, ritonavir) and zidovudine -certain blood pressure medications such as   benazepril, captopril, enalapril, fosinopril, lisinopril, moexipril, monopril, perindopril, quinapril, ramipril, trandolapril -certain cancer medications such as anthracyclines (e.g., daunorubicin, doxorubicin), busulfan, cytarabine, paclitaxel, pentostatin, tamoxifen, trastuzumab -certain diuretics such as chlorothiazide, chlorthalidone, hydrochlorothiazide, indapamide, metolazone -certain medicines that treat or  prevent blood clots like warfarin -certain muscle relaxants such as succinylcholine -cyclosporine -etanercept -indomethacin -medicines to increase blood counts like filgrastim, pegfilgrastim, sargramostim -medicines used as general anesthesia -metronidazole -natalizumab This list may not describe all possible interactions. Give your health care provider a list of all the medicines, herbs, non-prescription drugs, or dietary supplements you use. Also tell them if you smoke, drink alcohol, or use illegal drugs. Some items may interact with your medicine. What should I watch for while using this medicine? Visit your doctor for checks on your progress. This drug may make you feel generally unwell. This is not uncommon, as chemotherapy can affect healthy cells as well as cancer cells. Report any side effects. Continue your course of treatment even though you feel ill unless your doctor tells you to stop. Drink water or other fluids as directed. Urinate often, even at night. In some cases, you may be given additional medicines to help with side effects. Follow all directions for their use. Call your doctor or health care professional for advice if you get a fever, chills or sore throat, or other symptoms of a cold or flu. Do not treat yourself. This drug decreases your body's ability to fight infections. Try to avoid being around people who are sick. This medicine may increase your risk to bruise or bleed. Call your doctor or health care professional if you notice any unusual bleeding. Be careful brushing and flossing your teeth or using a toothpick because you may get an infection or bleed more easily. If you have any dental work done, tell your dentist you are receiving this medicine. You may get drowsy or dizzy. Do not drive, use machinery, or do anything that needs mental alertness until you know how this medicine affects you. Do not become pregnant while taking this medicine or for 1 year after  stopping it. Women should inform their doctor if they wish to become pregnant or think they might be pregnant. Men should not father a child while taking this medicine and for 4 months after stopping it. There is a potential for serious side effects to an unborn child. Talk to your health care professional or pharmacist for more information. Do not breast-feed an infant while taking this medicine. This medicine may interfere with the ability to have a child. This medicine has caused ovarian failure in some women. This medicine has caused reduced sperm counts in some men. You should talk with your doctor or health care professional if you are concerned about your fertility. If you are going to have surgery, tell your doctor or health care professional that you have taken this medicine. What side effects may I notice from receiving this medicine? Side effects that you should report to your doctor or health care professional as soon as possible: -allergic reactions like skin rash, itching or hives, swelling of the face, lips, or tongue -low blood counts - this medicine may decrease the number of white blood cells, red blood cells and platelets. You may be at increased risk for infections and bleeding. -signs of infection - fever or chills, cough, sore throat, pain or difficulty passing urine -signs of decreased platelets or bleeding - bruising, pinpoint red spots on the skin, black, tarry stools, blood in the urine -signs  of decreased red blood cells - unusually weak or tired, fainting spells, lightheadedness -breathing problems -dark urine -dizziness -palpitations -swelling of the ankles, feet, hands -trouble passing urine or change in the amount of urine -weight gain -yellowing of the eyes or skin Side effects that usually do not require medical attention (report to your doctor or health care professional if they continue or are bothersome): -changes in nail or skin color -hair loss -missed  menstrual periods -mouth sores -nausea, vomiting This list may not describe all possible side effects. Call your doctor for medical advice about side effects. You may report side effects to FDA at 1-800-FDA-1088. Where should I keep my medicine? This drug is given in a hospital or clinic and will not be stored at home. NOTE: This sheet is a summary. It may not cover all possible information. If you have questions about this medicine, talk to your doctor, pharmacist, or health care provider.  2018 Elsevier/Gold Standard (2012-08-11 16:22:58)

## 2017-10-18 ENCOUNTER — Encounter: Payer: Self-pay | Admitting: Nurse Practitioner

## 2017-10-18 ENCOUNTER — Other Ambulatory Visit: Payer: Self-pay

## 2017-10-18 ENCOUNTER — Inpatient Hospital Stay (HOSPITAL_BASED_OUTPATIENT_CLINIC_OR_DEPARTMENT_OTHER): Payer: Medicare Other | Admitting: Nurse Practitioner

## 2017-10-18 ENCOUNTER — Telehealth: Payer: Self-pay | Admitting: *Deleted

## 2017-10-18 ENCOUNTER — Inpatient Hospital Stay: Payer: Medicare Other

## 2017-10-18 VITALS — BP 162/72 | HR 72 | Temp 97.1°F | Resp 20 | Wt 176.1 lb

## 2017-10-18 DIAGNOSIS — G629 Polyneuropathy, unspecified: Secondary | ICD-10-CM

## 2017-10-18 MED ORDER — GABAPENTIN 300 MG PO CAPS: 300.0000 mg | ORAL_CAPSULE | Freq: Three times a day (TID) | ORAL | 0 refills | Status: DC

## 2017-10-18 NOTE — Patient Instructions (Addendum)
Gabapentin capsules or tablets What is this medicine? GABAPENTIN (GA ba pen tin) is used to control partial seizures in adults with epilepsy. It is also used to treat certain types of nerve pain. This medicine may be used for other purposes; ask your health care provider or pharmacist if you have questions. COMMON BRAND NAME(S): Active-PAC with Gabapentin, Gabarone, Neurontin What should I tell my health care provider before I take this medicine? They need to know if you have any of these conditions: -kidney disease -suicidal thoughts, plans, or attempt; a previous suicide attempt by you or a family member -an unusual or allergic reaction to gabapentin, other medicines, foods, dyes, or preservatives -pregnant or trying to get pregnant -breast-feeding How should I use this medicine? Take this medicine by mouth with a glass of water. Follow the directions on the prescription label. You can take it with or without food. If it upsets your stomach, take it with food.Take your medicine at regular intervals. Do not take it more often than directed. Do not stop taking except on your doctor's advice. If you are directed to break the 600 or 800 mg tablets in half as part of your dose, the extra half tablet should be used for the next dose. If you have not used the extra half tablet within 28 days, it should be thrown away. A special MedGuide will be given to you by the pharmacist with each prescription and refill. Be sure to read this information carefully each time. Talk to your pediatrician regarding the use of this medicine in children. Special care may be needed. Overdosage: If you think you have taken too much of this medicine contact a poison control center or emergency room at once. NOTE: This medicine is only for you. Do not share this medicine with others. What if I miss a dose? If you miss a dose, take it as soon as you can. If it is almost time for your next dose, take only that dose. Do not  take double or extra doses. What may interact with this medicine? Do not take this medicine with any of the following medications: -other gabapentin products This medicine may also interact with the following medications: -alcohol -antacids -antihistamines for allergy, cough and cold -certain medicines for anxiety or sleep -certain medicines for depression or psychotic disturbances -homatropine; hydrocodone -naproxen -narcotic medicines (opiates) for pain -phenothiazines like chlorpromazine, mesoridazine, prochlorperazine, thioridazine This list may not describe all possible interactions. Give your health care provider a list of all the medicines, herbs, non-prescription drugs, or dietary supplements you use. Also tell them if you smoke, drink alcohol, or use illegal drugs. Some items may interact with your medicine. What should I watch for while using this medicine? Visit your doctor or health care professional for regular checks on your progress. You may want to keep a record at home of how you feel your condition is responding to treatment. You may want to share this information with your doctor or health care professional at each visit. You should contact your doctor or health care professional if your seizures get worse or if you have any new types of seizures. Do not stop taking this medicine or any of your seizure medicines unless instructed by your doctor or health care professional. Stopping your medicine suddenly can increase your seizures or their severity. Wear a medical identification bracelet or chain if you are taking this medicine for seizures, and carry a card that lists all your medications. You may get drowsy, dizzy,   or have blurred vision. Do not drive, use machinery, or do anything that needs mental alertness until you know how this medicine affects you. To reduce dizzy or fainting spells, do not sit or stand up quickly, especially if you are an older patient. Alcohol can  increase drowsiness and dizziness. Avoid alcoholic drinks. Your mouth may get dry. Chewing sugarless gum or sucking hard candy, and drinking plenty of water will help. The use of this medicine may increase the chance of suicidal thoughts or actions. Pay special attention to how you are responding while on this medicine. Any worsening of mood, or thoughts of suicide or dying should be reported to your health care professional right away. Women who become pregnant while using this medicine may enroll in the [place] Pregnancy Registry by calling [id number]. This registry collects information about the safety of antiepileptic drug use during pregnancy. What side effects may I notice from receiving this medicine? Side effects that you should report to your doctor or health care professional as soon as possible: -allergic reactions like skin rash, itching or hives, swelling of the face, lips, or tongue -worsening of mood, thoughts or actions of suicide or dying Side effects that usually do not require medical attention (report to your doctor or health care professional if they continue or are bothersome): -constipation -difficulty walking or controlling muscle movements -dizziness -nausea -slurred speech -tiredness -tremors -weight gain This list may not describe all possible side effects. Call your doctor for medical advice about side effects. You may report side effects to FDA at 1-800-FDA-1088. Where should I keep my medicine? Keep out of reach of children. This medicine may cause accidental overdose and death if it taken by other adults, children, or pets. Mix any unused medicine with a substance like cat litter or coffee grounds. Then throw the medicine away in a sealed container like a sealed bag or a coffee can with a lid. Do not use the medicine after the expiration date. Store at room temperature between 15 and 30 degrees C (59 and 86 degrees F). NOTE: This  sheet is a summary. It may not cover all possible information. If you have questions about this medicine, talk to your doctor, pharmacist, or health care provider.  2018 Elsevier/Gold Standard (2013-11-23 15:26:50)  Peripheral Neuropathy Peripheral neuropathy is a type of nerve damage. It affects nerves that carry signals between the spinal cord and other parts of the body. These are called peripheral nerves. With peripheral neuropathy, one nerve or a group of nerves may be damaged. What are the causes? Many things can damage peripheral nerves. For some people with peripheral neuropathy, the cause is unknown. Some causes include:  Diabetes. This is the most common cause of peripheral neuropathy.  Injury to a nerve.  Pressure or stress on a nerve that lasts a long time.  Too little vitamin B. Alcoholism can lead to this.  Infections.  Autoimmune diseases, such as multiple sclerosis and systemic lupus erythematosus.  Inherited nerve diseases.  Some medicines, such as cancer drugs.  Toxic substances, such as lead and mercury.  Too little blood flowing to the legs.  Kidney disease.  Thyroid disease.  What are the signs or symptoms? Different people have different symptoms. The symptoms you have will depend on which of your nerves is damaged. Common symptoms include:  Loss of feeling (numbness) in the feet and hands.  Tingling in the feet and hands.  Pain that burns.  Very sensitive skin.  Weakness.  Not being able to move a part of the body (paralysis).  Muscle twitching.  Clumsiness or poor coordination.  Loss of balance.  Not being able to control your bladder.  Feeling dizzy.  Sexual problems.  How is this diagnosed? Peripheral neuropathy is a symptom, not a disease. Finding the cause of peripheral neuropathy can be hard. To figure that out, your health care provider will take a medical history and do a physical exam. A neurological exam will also be done.  This involves checking things affected by your brain, spinal cord, and nerves (nervous system). For example, your health care provider will check your reflexes, how you move, and what you can feel. Other types of tests may also be ordered, such as:  Blood tests.  A test of the fluid in your spinal cord.  Imaging tests, such as CT scans or an MRI.  Electromyography (EMG). This test checks the nerves that control muscles.  Nerve conduction velocity tests. These tests check how fast messages pass through your nerves.  Nerve biopsy. A small piece of nerve is removed. It is then checked under a microscope.  How is this treated?  Medicine is often used to treat peripheral neuropathy. Medicines may include: ? Pain-relieving medicines. Prescription or over-the-counter medicine may be suggested. ? Antiseizure medicine. This may be used for pain. ? Antidepressants. These also may help ease pain from neuropathy. ? Lidocaine. This is a numbing medicine. You might wear a patch or be given a shot. ? Mexiletine. This medicine is typically used to help control irregular heart rhythms.  Surgery. Surgery may be needed to relieve pressure on a nerve or to destroy a nerve that is causing pain.  Physical therapy to help movement.  Assistive devices to help movement. Follow these instructions at home:  Only take over-the-counter or prescription medicines as directed by your health care provider. Follow the instructions carefully for any given medicines. Do not take any other medicines without first getting approval from your health care provider.  If you have diabetes, work closely with your health care provider to keep your blood sugar under control.  If you have numbness in your feet: ? Check every day for signs of injury or infection. Watch for redness, warmth, and swelling. ? Wear padded socks and comfortable shoes. These help protect your feet.  Do not do things that put pressure on your  damaged nerve.  Do not smoke. Smoking keeps blood from getting to damaged nerves.  Avoid or limit alcohol. Too much alcohol can cause a lack of B vitamins. These vitamins are needed for healthy nerves.  Develop a good support system. Coping with peripheral neuropathy can be stressful. Talk to a mental health specialist or join a support group if you are struggling.  Follow up with your health care provider as directed. Contact a health care provider if:  You have new signs or symptoms of peripheral neuropathy.  You are struggling emotionally from dealing with peripheral neuropathy.  You have a fever. Get help right away if:  You have an injury or infection that is not healing.  You feel very dizzy or begin vomiting.  You have chest pain.  You have trouble breathing. This information is not intended to replace advice given to you by your health care provider. Make sure you discuss any questions you have with your health care provider. Document Released: 09/17/2002 Document Revised: 03/04/2016 Document Reviewed: 06/04/2013 Elsevier Interactive Patient Education  2017 Elsevier

## 2017-10-18 NOTE — Progress Notes (Signed)
Symptom Management Consult note [facility]  [phone] [phone]  Patient Care Team: Valerie Roys, DO as PCP - General (Family Medicine)   Name of the patient: [name]  [id number]  November 12, 1940   Date of visit: 10/18/17  Diagnosis- Multiple Myeloma  Chief complaint/ Reason for visit- bilateral hand pain  Heme/Onc history: Patient last evaluated by primary oncologist, Dr. Grayland Ormond, on 10/13/17.  History of multiple myeloma.  Bone marrow biopsy noted to have 29% plasma cells with kappa restriction.  Light chain significantly elevated although SPEP does not reveal an M spike.  Evidence of organ damage given pancytopenia. creatinine and calcium levels are within normal limits.  Patient did not go to his metastatic survey.  Given history of significant alcoholism, oral chemo therapy is not an option.    Interval history-patient presents to symptom management clinic from chemotherapy education class with complaints of bilateral hand pain.   Patient reports that pain is chronic and ongoing. Complains of pain, rated 5/10, describes as numbness, throbbing, and tingling. Constant. Significant difficulty performing his ADLs. Does not believe he has seen anyone for this problem.  Significant history of substance abuse. Chronic dizziness and weakness. Walker for ambulation.    ECOG FS:2 - Symptomatic, <50% confined to bed  Review of systems- Review of Systems  Constitutional: Negative for chills and fever.  HENT: Negative.   Eyes: Negative.   Respiratory: Positive for cough and sputum production.   Cardiovascular: Negative.   Gastrointestinal: Negative for abdominal pain, constipation, diarrhea, nausea and vomiting.  Genitourinary: Positive for urgency.  Musculoskeletal: Positive for back pain, falls and myalgias.       Unable to button clothes and zip pants  Skin: Negative for itching and rash.  Neurological: Positive for tingling,  sensory change and weakness. Negative for focal weakness.  Psychiatric/Behavioral: The patient is not nervous/anxious.        Limited Reading and Writing Comprehension     Current treatment- Plan for patient to receive Cytoxan on days 1 and 8 with Velcade on days 1, 4, 8, and 11 along with dexamethasone.  Patient declined port placement.  Plan is to initiate cycle 1, day 1, on 10/24/17.  Allergies  Allergen Reactions  . Metformin And Related Nausea And Vomiting     Past Medical History:  Diagnosis Date  . Arthritis   . Diabetes mellitus   . Dyspnea   . 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 Hypertension   . Hypothyroidism   . Neuropathy   . Peripheral vascular disease ([place])   . Venous stasis      Past Surgical History:  Procedure Laterality Date  . CATARACT EXTRACTION W/PHACO Right 10/25/2016   Procedure: CATARACT EXTRACTION PHACO AND INTRAOCULAR LENS PLACEMENT (IOC)  right eye complicated;  Surgeon: Ronnell Freshwater, MD;  Location: [place];  Service: Ophthalmology;  Laterality: Right;  Right eye Diabetic - oral meds Vision blue  . FINGER SURGERY      Social History   Socioeconomic History  . Marital status: Married    Spouse name: Not on file  . Number of children: 2  . Years of education: Not on file  . Highest education level: Not on file  Social Needs  . Financial resource strain: Not on file  . Food insecurity - worry: Not on file  . Food insecurity - inability: Not on file  . Transportation needs -  medical: Not on file  . Transportation needs - non-medical: Not on file  Occupational History  . Occupation: Custodian  Tobacco Use  . Smoking status: Former Smoker    Packs/day: 0.25    Types: Cigarettes    Last attempt to quit: 03/23/2015    Years since quitting: 2.5  . Smokeless tobacco: Current User    Types: Chew  Substance and Sexual Activity  . Alcohol use: Yes    Alcohol/week: 25.2 oz     Types: 42 Cans of beer per week    Comment: 5-6 every day  . Drug use: No  . Sexual activity: Not on file    Comment: currently trying to quit now  Other Topics Concern  . Not on file  Social History Narrative   Lives at home with son, ambulates with a cane    Family History  Problem Relation Age of Onset  . Heart disease Mother   . Alzheimer's disease Sister   . Heart disease Maternal Aunt      Current Outpatient Medications:  .  acyclovir (ZOVIRAX) 400 MG tablet, Take 1 tablet (400 mg total) by mouth 2 (two) times daily., Disp: 60 tablet, Rfl: 3 .  levothyroxine (SYNTHROID, LEVOTHROID) 75 MCG tablet, Take 1 tablet (75 mcg total) daily before breakfast by mouth., Disp: 90 tablet, Rfl: 3 .  lisinopril (PRINIVIL,ZESTRIL) 5 MG tablet, Take 1 tablet (5 mg total) by mouth daily., Disp: 90 tablet, Rfl: 1 .  dexamethasone (DECADRON) 4 MG tablet, Take 10 tablets (40 mg) on days 1, 8, and 15 of chemo. Repeat every 21 days. Take 2 tablets (50m) daily for 2 days starting after days 1 and 8 of chemotherapy. (Patient not taking: Reported on 10/18/2017), Disp: 30 tablet, Rfl: 3 .  gabapentin (NEURONTIN) 300 MG capsule, Take 1 capsule (300 mg total) by mouth 3 (three) times daily., Disp: 90 capsule, Rfl: 0 .  ondansetron (ZOFRAN) 8 MG tablet, Take 1 tablet (8 mg total) by mouth 2 (two) times daily as needed for refractory nausea / vomiting. (Patient not taking: Reported on 10/18/2017), Disp: 30 tablet, Rfl: 2 .  prochlorperazine (COMPAZINE) 10 MG tablet, Take 1 tablet (10 mg total) by mouth every 6 (six) hours as needed (Nausea or vomiting). (Patient not taking: Reported on 10/18/2017), Disp: 60 tablet, Rfl: 2  Current Facility-Administered Medications:  .  cyanocobalamin ((VITAMIN B-12)) injection 1,000 mcg, 1,000 mcg, Intramuscular, Q30 days, JValerie Roys DO, 1,000 mcg at 09/26/17 1205  Physical exam:  Vitals:   10/18/17 1204  BP: (!) 162/72  Pulse: 72  Resp: 20  Temp: (!) 97.1 F (36.2 C)    TempSrc: Tympanic  Weight: 176 lb 1.6 oz (79.9 kg)   Physical Exam  Constitutional:  Elderly man. Poor hygiene. Smells of urine. Accompanied by family member.   HENT:  Head: Normocephalic and atraumatic.  Eyes: Conjunctivae are normal. No scleral icterus.  Cardiovascular: Normal rate and regular rhythm.  Pulmonary/Chest: Effort normal. No respiratory distress.  Abdominal: Soft. Bowel sounds are normal. He exhibits no distension.  Musculoskeletal:       Right hand: He exhibits tenderness. He exhibits normal capillary refill, no deformity and no swelling.       Left hand: He exhibits tenderness. He exhibits no bony tenderness, normal capillary refill, no deformity and no swelling.  Walker for ambulation  Lymphadenopathy:    He has no cervical adenopathy.  Neurological: He is alert.  Skin: Skin is warm and dry.  Psychiatric: He expresses  impulsivity. He exhibits abnormal new learning ability.      CMP Latest Ref Rng & Units 10/13/2017  Glucose 65 - 99 mg/dL -  BUN 6 - 20 mg/dL -  Creatinine 0.61 - 1.24 mg/dL -  Sodium 135 - 145 mmol/L -  Potassium 3.5 - 5.1 mmol/L -  Chloride 101 - 111 mmol/L -  CO2 22 - 32 mmol/L -  Calcium 8.9 - 10.3 mg/dL -  Total Protein 6.5 - 8.1 g/dL 6.0(L)  Total Bilirubin 0.3 - 1.2 mg/dL 1.3(H)  Alkaline Phos 38 - 126 U/L 121  AST 15 - 41 U/L 46(H)  ALT 17 - 63 U/L 26   CBC Latest Ref Rng & Units 10/13/2017  WBC 3.8 - 10.6 K/uL 3.5(L)  Hemoglobin 13.0 - 18.0 g/dL 9.9(L)  Hematocrit 40.0 - 52.0 % 28.1(L)  Platelets 150 - 440 K/uL 89(L)    No results found.   Assessment and plan- Patient is a 77 y.o. male who presents to symptom management clinic for complaints of bilateral hand pain.  1.  Peripheral neuropathy-complaints consistent with neuropathy.  Given comorbidities, likely increased risk.  Exam largely reassuring.  Will initiate gabapentin 300 mg daily.  If tolerated, can up titrate to 3 times daily.  Will defer to PCP for management of  symptoms not likely related to multiple myeloma.   Visit Diagnosis 1. Peripheral polyneuropathy     Patient expressed understanding and was in agreement with this plan. He also understands that He can call clinic at any time with any questions, concerns, or complaints.    Beckey Rutter, DNP, AGNP-C [place] at [facility] [phone] (office) 10/18/17 12:31 PM

## 2017-10-18 NOTE — Telephone Encounter (Signed)
Discussed with Dr Grayland Ormond and Alease Medina, NP  Patient accepted appointment at 11 AM after chemotherapy class

## 2017-10-18 NOTE — Progress Notes (Signed)
Patient here today for add on in symptom management clinic. Patient reports pain and numbness in both hands.

## 2017-10-18 NOTE — Telephone Encounter (Signed)
Patient requesting to be seen today because he needs medicine because his hands are hurting. Please advise

## 2017-10-19 ENCOUNTER — Other Ambulatory Visit: Payer: Medicare Other

## 2017-10-21 ENCOUNTER — Ambulatory Visit
Admission: RE | Admit: 2017-10-21 | Discharge: 2017-10-21 | Disposition: A | Discharge status: Home / Self Care | Payer: Medicare Other | Source: Ambulatory Visit | Attending: Family Medicine | Admitting: Family Medicine

## 2017-10-21 ENCOUNTER — Ambulatory Visit (INDEPENDENT_AMBULATORY_CARE_PROVIDER_SITE_OTHER): Payer: Medicare Other | Admitting: Family Medicine

## 2017-10-21 ENCOUNTER — Encounter: Payer: Self-pay | Admitting: Family Medicine

## 2017-10-21 VITALS — BP 138/40 | HR 83 | Temp 98.1°F | Wt 174.1 lb

## 2017-10-21 DIAGNOSIS — I129 Hypertensive chronic kidney disease with stage 1 through stage 4 chronic kidney disease, or unspecified chronic kidney disease: Secondary | ICD-10-CM

## 2017-10-21 DIAGNOSIS — R918 Other nonspecific abnormal finding of lung field: Secondary | ICD-10-CM | POA: Insufficient documentation

## 2017-10-21 DIAGNOSIS — M5441 Lumbago with sciatica, right side: Secondary | ICD-10-CM

## 2017-10-21 DIAGNOSIS — R0781 Pleurodynia: Secondary | ICD-10-CM

## 2017-10-21 DIAGNOSIS — C9000 Multiple myeloma not having achieved remission: Secondary | ICD-10-CM

## 2017-10-21 DIAGNOSIS — M545 Low back pain: Secondary | ICD-10-CM | POA: Diagnosis not present

## 2017-10-21 DIAGNOSIS — J449 Chronic obstructive pulmonary disease, unspecified: Secondary | ICD-10-CM | POA: Insufficient documentation

## 2017-10-21 DIAGNOSIS — M818 Other osteoporosis without current pathological fracture: Secondary | ICD-10-CM | POA: Insufficient documentation

## 2017-10-21 DIAGNOSIS — E538 Deficiency of other specified B group vitamins: Secondary | ICD-10-CM | POA: Diagnosis not present

## 2017-10-21 DIAGNOSIS — G6289 Other specified polyneuropathies: Secondary | ICD-10-CM | POA: Diagnosis not present

## 2017-10-21 DIAGNOSIS — M5136 Other intervertebral disc degeneration, lumbar region: Secondary | ICD-10-CM | POA: Diagnosis not present

## 2017-10-21 MED ORDER — AMITRIPTYLINE HCL 25 MG PO TABS: 25.0000 mg | ORAL_TABLET | Freq: Every day | ORAL | 3 refills | Status: DC

## 2017-10-21 NOTE — Assessment & Plan Note (Signed)
Amitriptyline given today. Continue to try to increase his gabapentin. Continue to follow with the cancer center

## 2017-10-21 NOTE — Assessment & Plan Note (Signed)
Very labile BP. Will keep him on his lisinopril for renal protection. Continue to monitor.

## 2017-10-21 NOTE — Progress Notes (Signed)
BP (!) 138/40 (BP Location: Left Arm, Cuff Size: Normal)   Pulse 83   Temp 98.1 F (36.7 C)   Wt 174 lb 2 oz (79 kg)   SpO2 97%   BMI 25.71 kg/m    Subjective:    Patient ID: [name], male    DOB: 29-Aug-1941, 77 y.o.   MRN: [id number]  HPI: [name] is a 77 y.o. male  Chief Complaint  Patient presents with  . Peripheral Neuropathy   NEUROPATHY- saw oncology regarding this 3 days ago, full note not available at this time. It appears that he was started on gabapentin 371m TID, gabapentin didn't seem help taking 1 pill  Neuropathy status: uncontrolled  Satisfied with current treatment?: no Medication side effects: no Medication compliance:  poor compliance Location:  Pain: yes Severity: severe  Quality:  Throbbing and numb and tingling Frequency: constant Bilateral: yes Symmetric: yes Numbness: yes Decreased sensation: yes Weakness: yes Context: worse  Back and side have been hurting- fell about 3 weeks ago into a door. Has been having pain ever since.   Relevant past medical, surgical, family and social history reviewed and updated as indicated. Interim medical history since our last visit reviewed. Allergies and medications reviewed and updated.  Review of Systems  Constitutional: Negative.   Respiratory: Negative.   Cardiovascular: Negative.   Musculoskeletal: Positive for back pain, gait problem and myalgias. Negative for arthralgias, joint swelling, neck pain and neck stiffness.  Skin: Negative.   Neurological: Positive for dizziness and numbness. Negative for tremors, seizures, syncope, facial asymmetry, speech difficulty, weakness, light-headedness and headaches.  Psychiatric/Behavioral: Negative.     Per HPI unless specifically indicated above     Objective:    BP (!) 138/40 (BP Location: Left Arm, Cuff Size: Normal)   Pulse 83   Temp 98.1 F (36.7 C)   Wt 174 lb 2 oz (79 kg)   SpO2 97%   BMI 25.71 kg/m   Wt Readings from Last 3  Encounters:  10/21/17 174 lb 2 oz (79 kg)  10/18/17 176 lb 1.6 oz (79.9 kg)  10/13/17 179 lb 6.4 oz (81.4 kg)    Physical Exam  Constitutional: He is oriented to person, place, and time. He appears well-developed and well-nourished. No distress.  HENT:  Head: Normocephalic and atraumatic.  Right Ear: Hearing normal.  Left Ear: Hearing normal.  Nose: Nose normal.  Eyes: Conjunctivae and lids are normal. Right eye exhibits no discharge. Left eye exhibits no discharge. No scleral icterus.  Cardiovascular: Normal rate, regular rhythm, normal heart sounds and intact distal pulses. Exam reveals no gallop and no friction rub.  No murmur heard. Pulmonary/Chest: Effort normal and breath sounds normal. No respiratory distress. He has no wheezes. He has no rales. He exhibits no tenderness.  Musculoskeletal: Normal range of motion. He exhibits tenderness (along anterior ribs on the R and in the paraspinals of the R Low back).  Neurological: He is alert and oriented to person, place, and time.  Skin: Skin is warm, dry and intact. No rash noted. He is not diaphoretic. No erythema. No pallor.  Psychiatric: He has a normal mood and affect. His speech is normal and behavior is normal. Judgment and thought content normal. Cognition and memory are normal.    Results for orders placed or performed in visit on 10/13/17  Hepatic function panel  Result Value Ref Range   Total Protein 6.0 (L) 6.5 - 8.1 g/dL   Albumin 3.7 3.5 -  5.0 g/dL   AST 46 (H) 15 - 41 U/L   ALT 26 17 - 63 U/L   Alkaline Phosphatase 121 38 - 126 U/L   Total Bilirubin 1.3 (H) 0.3 - 1.2 mg/dL   Bilirubin, Direct 0.3 0.1 - 0.5 mg/dL   Indirect Bilirubin 1.0 (H) 0.3 - 0.9 mg/dL  Folate  Result Value Ref Range   Folate 7.7 >5.9 ng/mL  Vitamin B12  Result Value Ref Range   Vitamin B-12 291 180 - 914 pg/mL  Magnesium  Result Value Ref Range   Magnesium 1.6 (L) 1.7 - 2.4 mg/dL  CBC with Differential/Platelet  Result Value Ref Range    WBC 3.5 (L) 3.8 - 10.6 K/uL   RBC 2.46 (L) 4.40 - 5.90 MIL/uL   Hemoglobin 9.9 (L) 13.0 - 18.0 g/dL   HCT 28.1 (L) 40.0 - 52.0 %   MCV 114.2 (H) 80.0 - 100.0 fL   MCH 40.3 (H) 26.0 - 34.0 pg   MCHC 35.3 32.0 - 36.0 g/dL   RDW 12.7 11.5 - 14.5 %   Platelets 89 (L) 150 - 440 K/uL   Neutrophils Relative % 75 %   Neutro Abs 2.7 1.4 - 6.5 K/uL   Lymphocytes Relative 20 %   Lymphs Abs 0.7 (L) 1.0 - 3.6 K/uL   Monocytes Relative 3 %   Monocytes Absolute 0.1 (L) 0.2 - 1.0 K/uL   Eosinophils Relative 1 %   Eosinophils Absolute 0.0 0 - 0.7 K/uL   Basophils Relative 1 %   Basophils Absolute 0.0 0 - 0.1 K/uL  Hold Tube- Blood Bank  Result Value Ref Range   Blood Bank Specimen SAMPLE AVAILABLE FOR TESTING    Sample Expiration      10/16/2017 Performed at [facility], [address] [place],  [address]       Assessment & Plan:   Problem List Items Addressed This Visit      Nervous and Auditory   Peripheral neuropathy    Amitriptyline given today. Continue to try to increase his gabapentin. Continue to follow with the cancer center      Relevant Medications   amitriptyline (ELAVIL) 25 MG tablet   Other Relevant Orders   CBC with Differential/Platelet   B12     Genitourinary   Benign hypertensive renal disease    Very labile BP. Will keep him on his lisinopril for renal protection. Continue to monitor.         Other   Multiple myeloma ([place])    To be starting chemo with the cancer center. Call with any concerns.       Relevant Medications   ondansetron (ZOFRAN) 8 MG tablet   B12 deficiency - Primary    B12 shot given today. Continue current regimen. Rechecking today. Await results.       Relevant Orders   CBC with Differential/Platelet   B12    Other Visit Diagnoses    Rib pain       Recent fall. Will obtain x-ray to look for fracture. Call with any concerns.    Relevant Orders   DG Ribs Unilateral Right   Acute right-sided low back pain with  right-sided sciatica       Recent fall. Will obtain x-ray to look for fracture. Call with any concerns.    Relevant Orders   DG Lumbar Spine Complete       Follow up plan: Return in about 4 weeks (around 11/18/2017).

## 2017-10-21 NOTE — Assessment & Plan Note (Signed)
B12 shot given today. Continue current regimen. Rechecking today. Await results.

## 2017-10-21 NOTE — Assessment & Plan Note (Signed)
To be starting chemo with the cancer center. Call with any concerns.

## 2017-10-22 LAB — CBC WITH DIFFERENTIAL/PLATELET
Basophils Absolute: 0 10*3/uL (ref 0.0–0.2)
Basos: 1 %
EOS (ABSOLUTE): 0 10*3/uL (ref 0.0–0.4)
Eos: 0 %
Hematocrit: 27.8 % — ABNORMAL LOW (ref 37.5–51.0)
Hemoglobin: 9.7 g/dL — ABNORMAL LOW (ref 13.0–17.7)
IMMATURE GRANULOCYTES: 0 %
Immature Grans (Abs): 0 10*3/uL (ref 0.0–0.1)
LYMPHS ABS: 0.5 10*3/uL — AB (ref 0.7–3.1)
Lymphs: 14 %
MCH: 38.3 pg — AB (ref 26.6–33.0)
MCHC: 34.9 g/dL (ref 31.5–35.7)
MCV: 110 fL — ABNORMAL HIGH (ref 79–97)
MONOS ABS: 0.2 10*3/uL (ref 0.1–0.9)
Monocytes: 6 %
NEUTROS PCT: 79 %
Neutrophils Absolute: 2.7 10*3/uL (ref 1.4–7.0)
Platelets: 69 10*3/uL — CL (ref 150–379)
RBC: 2.53 x10E6/uL — AB (ref 4.14–5.80)
RDW: 13.7 % (ref 12.3–15.4)
WBC: 3.5 10*3/uL (ref 3.4–10.8)

## 2017-10-22 LAB — VITAMIN B12: Vitamin B-12: >2000 pg/mL — ABNORMAL HIGH (ref 232–1245)

## 2017-10-23 NOTE — Progress Notes (Signed)
[place]  [phone] [phone]  ID: [name] OB: 1941-05-16  MR#: [id number]  AGT#:[id number]  Patient Care Team: Valerie Roys, DO as PCP - General (Family Medicine)  CHIEF COMPLAINT: Multiple myeloma.  INTERVAL HISTORY: Patient returns to clinic for further evaluation and initiation of cycle 1, day 1 of Cytoxan and Velcade.  He continues to have a painful neuropathy in his bilateral hands.  He continues to have increased weakness and fatigue.  He reports occasional dizziness as well. He denies any fevers or illnesses. He denies any easy bleeding or bruising. He denies any chest pain or shortness of breath. He has no nausea, vomiting, constipation, or diarrhea. He has no urinary complaints. Patient offers no further specific complaints today.  REVIEW OF SYSTEMS:   Review of Systems  Constitutional: Positive for malaise/fatigue. Negative for fever.  Respiratory: Negative.  Negative for cough and shortness of breath.   Cardiovascular: Negative.  Negative for chest pain and leg swelling.  Gastrointestinal: Negative.  Negative for abdominal pain, blood in stool and melena.  Musculoskeletal: Negative.   Skin: Negative.  Negative for rash.  Neurological: Positive for dizziness, tingling, sensory change and weakness.  Endo/Heme/Allergies: Does not bruise/bleed easily.  Psychiatric/Behavioral: Positive for substance abuse. The patient is not nervous/anxious.     As per HPI. Otherwise, a complete review of systems is negative.  PAST MEDICAL HISTORY: Past Medical History:  Diagnosis Date  . Arthritis   . Diabetes mellitus   . Dyspnea   . 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 Hypertension   . Hypothyroidism   . Neuropathy   . Peripheral vascular disease ([place])   . Venous stasis     PAST SURGICAL HISTORY: Past Surgical History:  Procedure Laterality Date  . CATARACT EXTRACTION W/PHACO Right  10/25/2016   Procedure: CATARACT EXTRACTION PHACO AND INTRAOCULAR LENS PLACEMENT (IOC)  right eye complicated;  Surgeon: Ronnell Freshwater, MD;  Location: [place];  Service: Ophthalmology;  Laterality: Right;  Right eye Diabetic - oral meds Vision blue  . FINGER SURGERY      FAMILY HISTORY Family History  Problem Relation Age of Onset  . Heart disease Mother   . Alzheimer's disease Sister   . Heart disease Maternal Aunt        ADVANCED DIRECTIVES:    HEALTH MAINTENANCE: Social History   Tobacco Use  . Smoking status: Former Smoker    Packs/day: 0.25    Types: Cigarettes    Last attempt to quit: 03/23/2015    Years since quitting: 2.5  . Smokeless tobacco: Current User    Types: Chew  Substance Use Topics  . Alcohol use: Yes    Alcohol/week: 25.2 oz    Types: 42 Cans of beer per week    Comment: 5-6 every day  . Drug use: No     Colonoscopy:  PAP:  Bone density:  Lipid panel:  Allergies  Allergen Reactions  . Metformin And Related Nausea And Vomiting    Current Outpatient Medications  Medication Sig Dispense Refill  . acyclovir (ZOVIRAX) 400 MG tablet Take 1 tablet (400 mg total) by mouth 2 (two) times daily. 60 tablet 3  . amitriptyline (ELAVIL) 25 MG tablet Take 1 tablet (25 mg total) by mouth at bedtime. 30 tablet 3  . dexamethasone (DECADRON) 4 MG tablet Take 10 tablets (40 mg) on days 1, 8, and 15 of chemo. Repeat every 21  days. Take 2 tablets (44m) daily for 2 days starting after days 1 and 8 of chemotherapy. 30 tablet 3  . gabapentin (NEURONTIN) 300 MG capsule Take 1 capsule (300 mg total) by mouth 3 (three) times daily. 90 capsule 0  . JARDIANCE 10 MG TABS tablet     . levothyroxine (SYNTHROID, LEVOTHROID) 75 MCG tablet Take 1 tablet (75 mcg total) daily before breakfast by mouth. 90 tablet 3  . lisinopril (PRINIVIL,ZESTRIL) 5 MG tablet Take 1 tablet (5 mg total) by mouth daily. 90 tablet 1  . ondansetron (ZOFRAN) 8 MG tablet     .  prochlorperazine (COMPAZINE) 10 MG tablet Take 1 tablet (10 mg total) by mouth every 6 (six) hours as needed (Nausea or vomiting). 60 tablet 2  . folic acid (FOLVITE) 1 MG tablet Take 1 tablet (1 mg total) by mouth daily. 30 tablet 2  . thiamine (VITAMIN B-1) 100 MG tablet Take 1 tablet (100 mg total) by mouth daily. 30 tablet 2  . traMADol (ULTRAM) 50 MG tablet Take 1 tablet (50 mg total) by mouth every 6 (six) hours as needed. 30 tablet 0   Current Facility-Administered Medications  Medication Dose Route Frequency Provider Last Rate Last Dose  . cyanocobalamin ((VITAMIN B-12)) injection 1,000 mcg  1,000 mcg Intramuscular Q30 days JPark LiterP, DO   1,000 mcg at 10/21/17 1117    OBJECTIVE: Vitals:   10/24/17 1030  BP: (!) 132/51  Pulse: 73  Resp: 20  Temp: (!) 97.3 F (36.3 C)     Body mass index is 25.1 kg/m.    ECOG FS:0 - Asymptomatic  General: Well-developed, well-nourished, no acute distress. Eyes: Pink conjunctiva, anicteric sclera. Lungs: Clear to auscultation bilaterally. Heart: Regular rate and rhythm. No rubs, murmurs, or gallops. Abdomen: Soft, nontender, nondistended. No organomegaly noted, normoactive bowel sounds. Musculoskeletal: No edema, cyanosis, or clubbing. Neuro: Alert, answering all questions appropriately. Cranial nerves grossly intact. Skin: No rashes or petechiae noted. Psych: Normal affect.   LAB RESULTS:  Lab Results  Component Value Date   NA 134 (L) 10/24/2017   K 3.9 10/24/2017   CL 98 (L) 10/24/2017   CO2 27 10/24/2017   GLUCOSE 144 (H) 10/24/2017   BUN 11 10/24/2017   CREATININE 0.93 10/24/2017   CALCIUM 9.1 10/24/2017   PROT 6.1 (L) 10/24/2017   ALBUMIN 3.8 10/24/2017   AST 44 (H) 10/24/2017   ALT 21 10/24/2017   ALKPHOS 139 (H) 10/24/2017   BILITOT 1.5 (H) 10/24/2017   GFRNONAA >60 10/24/2017   GFRAA >60 10/24/2017    Lab Results  Component Value Date   WBC 3.2 (L) 10/24/2017   NEUTROABS 2.4 10/24/2017   HGB 9.9 (L)  10/24/2017   HCT 27.5 (L) 10/24/2017   MCV 113.3 (H) 10/24/2017   PLT 68 (L) 10/24/2017     STUDIES: Dg Ribs Unilateral Right  Result Date: 10/21/2017 CLINICAL DATA:  Low back and right posterior rib pain following a fall 3 weeks ago and then again 3 days ago. Ex-smoker. EXAM: RIGHT RIBS - 2 VIEW COMPARISON:  None. FINDINGS: Stable increased density in the right upper lung zone due to fusion of the anterior portions of the right 2nd and 3rd ribs. No rib fracture or pneumothorax. The lungs remain mildly hyperexpanded. Interval small amount of linear density in the right lower lung zone. IMPRESSION: 1. No rib fracture or pneumothorax. 2. Stable mild changes of COPD. 3. Interval minimal linear atelectasis or scarring in the right lower lung zone.  Electronically Signed   By: Claudie Revering M.D.   On: 10/21/2017 18:20   Dg Lumbar Spine Complete  Result Date: 10/21/2017 CLINICAL DATA:  Low back pain for 3 weeks post fall EXAM: LUMBAR SPINE - COMPLETE 4+ VIEW COMPARISON:  None FINDINGS: Diffuse osseous demineralization. Five non-rib-bearing lumbar vertebra. Scattered endplate spur formation greatest at LEFT L2 through L3. Vertebral body heights maintained. No definite acute fracture, subluxation or bone destruction. No spondylolysis. IMPRESSION: Osseous demineralization with degenerative disc disease changes. No acute abnormalities. Electronically Signed   By: Lavonia Dana M.D.   On: 10/21/2017 18:16    ASSESSMENT: Multiple myeloma.  PLAN:  1.  Multiple myeloma: Bone marrow biopsy noted to have 29% plasma cells with kappa restriction.  Patient's kappa light chains are significantly elevated, although SPEP does not reveal an M spike. He has evidence of an organ damage with his pancytopenia, but his creatinine and calcium levels are within normal limits.  Patient did not go to his metastatic survey as scheduled. The patient will benefit from chemotherapy, but given his alcoholism hesitant to use any oral  regimen.  Patient will receive Cytoxan on days 1 and 8 along with Velcade on days 1, 4, 8, and 11.  He will also receive treatment doses dexamethasone.  We discussed possible port placement, but patient declined at this time.  Proceed with cycle 1, day 1 of treatment today.  Return to clinic on day 4, 8, and 11 for treatment as scheduled and then in 3 weeks for consideration of cycle 2. 2. Pancytopenia: Likely secondary to underlying multiple myeloma, though his heavy alcoholism is possibly contributing.    Have dose reduced both Cytoxan and Velcade from the onset. 3.  Elevated MCV: Patient's B12 and folate are within normal limits. 4.  Hypertension: Unclear patient's compliance, continue management and treatment per PCP. 5.  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  Continue gabapentin, and have recommended patient take folic acid, thiamine, and a multivitamin.  Approximately 30 minutes was spent in discussion of which greater than 50% was consultation.  Patient expressed understanding and was in agreement with this plan. He also understands that He can call clinic at any time with any questions, concerns, or complaints.    Lloyd Huger, MD   10/26/2017 10:18 AM

## 2017-10-24 ENCOUNTER — Other Ambulatory Visit: Payer: Self-pay

## 2017-10-24 ENCOUNTER — Telehealth: Payer: Self-pay | Admitting: Family Medicine

## 2017-10-24 ENCOUNTER — Encounter: Payer: Self-pay | Admitting: Oncology

## 2017-10-24 ENCOUNTER — Inpatient Hospital Stay: Payer: Medicare Other

## 2017-10-24 ENCOUNTER — Inpatient Hospital Stay (HOSPITAL_BASED_OUTPATIENT_CLINIC_OR_DEPARTMENT_OTHER): Payer: Medicare Other | Admitting: Oncology

## 2017-10-24 VITALS — BP 132/51 | HR 73 | Temp 97.3°F | Resp 20 | Wt 170.0 lb

## 2017-10-24 DIAGNOSIS — Z5112 Encounter for antineoplastic immunotherapy: Secondary | ICD-10-CM | POA: Diagnosis not present

## 2017-10-24 DIAGNOSIS — Z87891 Personal history of nicotine dependence: Secondary | ICD-10-CM | POA: Diagnosis not present

## 2017-10-24 DIAGNOSIS — R531 Weakness: Secondary | ICD-10-CM | POA: Diagnosis not present

## 2017-10-24 DIAGNOSIS — I739 Peripheral vascular disease, unspecified: Secondary | ICD-10-CM | POA: Diagnosis not present

## 2017-10-24 DIAGNOSIS — I10 Essential (primary) hypertension: Secondary | ICD-10-CM

## 2017-10-24 DIAGNOSIS — M1990 Unspecified osteoarthritis, unspecified site: Secondary | ICD-10-CM | POA: Diagnosis not present

## 2017-10-24 DIAGNOSIS — C9000 Multiple myeloma not having achieved remission: Secondary | ICD-10-CM | POA: Diagnosis not present

## 2017-10-24 DIAGNOSIS — R42 Dizziness and giddiness: Secondary | ICD-10-CM | POA: Diagnosis not present

## 2017-10-24 DIAGNOSIS — R5383 Other fatigue: Secondary | ICD-10-CM | POA: Diagnosis not present

## 2017-10-24 DIAGNOSIS — M79642 Pain in left hand: Secondary | ICD-10-CM | POA: Diagnosis not present

## 2017-10-24 DIAGNOSIS — G629 Polyneuropathy, unspecified: Secondary | ICD-10-CM

## 2017-10-24 DIAGNOSIS — R5381 Other malaise: Secondary | ICD-10-CM | POA: Diagnosis not present

## 2017-10-24 DIAGNOSIS — D61818 Other pancytopenia: Secondary | ICD-10-CM | POA: Diagnosis not present

## 2017-10-24 DIAGNOSIS — Z79899 Other long term (current) drug therapy: Secondary | ICD-10-CM | POA: Diagnosis not present

## 2017-10-24 DIAGNOSIS — M79641 Pain in right hand: Secondary | ICD-10-CM | POA: Diagnosis not present

## 2017-10-24 DIAGNOSIS — E119 Type 2 diabetes mellitus without complications: Secondary | ICD-10-CM | POA: Diagnosis not present

## 2017-10-24 DIAGNOSIS — E039 Hypothyroidism, unspecified: Secondary | ICD-10-CM | POA: Diagnosis not present

## 2017-10-24 LAB — COMPREHENSIVE METABOLIC PANEL
ALK PHOS: 139 U/L — AB (ref 38–126)
ALT: 21 U/L (ref 17–63)
ANION GAP: 9 (ref 5–15)
AST: 44 U/L — ABNORMAL HIGH (ref 15–41)
Albumin: 3.8 g/dL (ref 3.5–5.0)
BUN: 11 mg/dL (ref 6–20)
CALCIUM: 9.1 mg/dL (ref 8.9–10.3)
CHLORIDE: 98 mmol/L — AB (ref 101–111)
CO2: 27 mmol/L (ref 22–32)
Creatinine, Ser: 0.93 mg/dL (ref 0.61–1.24)
GFR calc Af Amer: >60 mL/min (ref >60)
GFR calc non Af Amer: >60 mL/min (ref >60)
Glucose, Bld: 144 mg/dL — ABNORMAL HIGH (ref 65–99)
Potassium: 3.9 mmol/L (ref 3.5–5.1)
SODIUM: 134 mmol/L — AB (ref 135–145)
Total Bilirubin: 1.5 mg/dL — ABNORMAL HIGH (ref 0.3–1.2)
Total Protein: 6.1 g/dL — ABNORMAL LOW (ref 6.5–8.1)

## 2017-10-24 LAB — CBC WITH DIFFERENTIAL/PLATELET
BASOS PCT: 0 %
Basophils Absolute: 0 10*3/uL (ref 0–0.1)
Eosinophils Absolute: 0 10*3/uL (ref 0–0.7)
Eosinophils Relative: 1 %
HEMATOCRIT: 27.5 % — AB (ref 40.0–52.0)
Hemoglobin: 9.9 g/dL — ABNORMAL LOW (ref 13.0–18.0)
LYMPHS ABS: 0.5 10*3/uL — AB (ref 1.0–3.6)
Lymphocytes Relative: 15 %
MCH: 40.7 pg — AB (ref 26.0–34.0)
MCHC: 35.9 g/dL (ref 32.0–36.0)
MCV: 113.3 fL — AB (ref 80.0–100.0)
MONO ABS: 0.3 10*3/uL (ref 0.2–1.0)
Monocytes Relative: 8 %
NEUTROS ABS: 2.4 10*3/uL (ref 1.4–6.5)
Neutrophils Relative %: 76 %
Platelets: 68 10*3/uL — ABNORMAL LOW (ref 150–440)
RBC: 2.43 MIL/uL — ABNORMAL LOW (ref 4.40–5.90)
RDW: 13.2 % (ref 11.5–14.5)
WBC: 3.2 10*3/uL — ABNORMAL LOW (ref 3.8–10.6)

## 2017-10-24 MED ORDER — PALONOSETRON HCL INJECTION 0.25 MG/5ML
0.2500 mg | Freq: Once | INTRAVENOUS | Status: AC
  Administered 2017-10-24: 0.25 mg via INTRAVENOUS
  Filled 2017-10-24: qty 5

## 2017-10-24 MED ORDER — BORTEZOMIB CHEMO SQ INJECTION 3.5 MG (2.5MG/ML)
1.0000 mg/m2 | Freq: Once | INTRAMUSCULAR | Status: AC
  Administered 2017-10-24: 2 mg via SUBCUTANEOUS
  Filled 2017-10-24: qty 2

## 2017-10-24 MED ORDER — FOLIC ACID 1 MG PO TABS: 1.0000 mg | ORAL_TABLET | Freq: Every day | ORAL | 2 refills | Status: DC

## 2017-10-24 MED ORDER — VITAMIN B-1 100 MG PO TABS
100.0000 mg | ORAL_TABLET | Freq: Every day | ORAL | 2 refills | Status: DC
Start: 2017-10-24 — End: 2018-05-21

## 2017-10-24 MED ORDER — TRAMADOL HCL 50 MG PO TABS: 50.0000 mg | ORAL_TABLET | Freq: Four times a day (QID) | ORAL | 0 refills | Status: DC | PRN

## 2017-10-24 MED ORDER — DEXAMETHASONE 4 MG PO TABS
40.0000 mg | ORAL_TABLET | Freq: Once | ORAL | Status: AC
  Administered 2017-10-24: 40 mg via ORAL
  Filled 2017-10-24: qty 10

## 2017-10-24 MED ORDER — SODIUM CHLORIDE 0.9 % IV SOLN
INTRAVENOUS | Status: DC
  Administered 2017-10-24: 11:00:00 via INTRAVENOUS
  Filled 2017-10-24: qty 1000

## 2017-10-24 MED ORDER — CYCLOPHOSPHAMIDE CHEMO INJECTION 1 GM
400.0000 mg/m2 | Freq: Once | INTRAMUSCULAR | Status: AC
  Administered 2017-10-24: 800 mg via INTRAVENOUS
  Filled 2017-10-24: qty 40

## 2017-10-24 NOTE — Telephone Encounter (Signed)
Called and left a message letting the patient know that he does not have a break.

## 2017-10-24 NOTE — Progress Notes (Signed)
Patient reports continued numbness and cramping in hands. Patient also reports pain to back and right side.

## 2017-10-24 NOTE — Telephone Encounter (Signed)
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 If it's still really hurting, I'll send him in a gel to put on his back. Thanks!

## 2017-10-27 ENCOUNTER — Inpatient Hospital Stay: Payer: Medicare Other

## 2017-10-27 VITALS — BP 111/51 | HR 82 | Temp 97.8°F | Resp 20

## 2017-10-27 DIAGNOSIS — M1990 Unspecified osteoarthritis, unspecified site: Secondary | ICD-10-CM | POA: Diagnosis not present

## 2017-10-27 DIAGNOSIS — I739 Peripheral vascular disease, unspecified: Secondary | ICD-10-CM | POA: Diagnosis not present

## 2017-10-27 DIAGNOSIS — Z5112 Encounter for antineoplastic immunotherapy: Secondary | ICD-10-CM | POA: Diagnosis not present

## 2017-10-27 DIAGNOSIS — M79641 Pain in right hand: Secondary | ICD-10-CM | POA: Diagnosis not present

## 2017-10-27 DIAGNOSIS — M79642 Pain in left hand: Secondary | ICD-10-CM | POA: Diagnosis not present

## 2017-10-27 DIAGNOSIS — Z87891 Personal history of nicotine dependence: Secondary | ICD-10-CM | POA: Diagnosis not present

## 2017-10-27 DIAGNOSIS — R42 Dizziness and giddiness: Secondary | ICD-10-CM | POA: Diagnosis not present

## 2017-10-27 DIAGNOSIS — R5381 Other malaise: Secondary | ICD-10-CM | POA: Diagnosis not present

## 2017-10-27 DIAGNOSIS — R531 Weakness: Secondary | ICD-10-CM | POA: Diagnosis not present

## 2017-10-27 DIAGNOSIS — G629 Polyneuropathy, unspecified: Secondary | ICD-10-CM | POA: Diagnosis not present

## 2017-10-27 DIAGNOSIS — Z79899 Other long term (current) drug therapy: Secondary | ICD-10-CM | POA: Diagnosis not present

## 2017-10-27 DIAGNOSIS — D61818 Other pancytopenia: Secondary | ICD-10-CM | POA: Diagnosis not present

## 2017-10-27 DIAGNOSIS — C9000 Multiple myeloma not having achieved remission: Secondary | ICD-10-CM | POA: Diagnosis not present

## 2017-10-27 DIAGNOSIS — R5383 Other fatigue: Secondary | ICD-10-CM | POA: Diagnosis not present

## 2017-10-27 DIAGNOSIS — I10 Essential (primary) hypertension: Secondary | ICD-10-CM | POA: Diagnosis not present

## 2017-10-27 DIAGNOSIS — E039 Hypothyroidism, unspecified: Secondary | ICD-10-CM | POA: Diagnosis not present

## 2017-10-27 DIAGNOSIS — E119 Type 2 diabetes mellitus without complications: Secondary | ICD-10-CM | POA: Diagnosis not present

## 2017-10-27 MED ORDER — BORTEZOMIB CHEMO SQ INJECTION 3.5 MG (2.5MG/ML)
1.0000 mg/m2 | Freq: Once | INTRAMUSCULAR | Status: AC
  Administered 2017-10-27: 2 mg via SUBCUTANEOUS
  Filled 2017-10-27: qty 2

## 2017-10-27 MED ORDER — PROCHLORPERAZINE MALEATE 10 MG PO TABS
10.0000 mg | ORAL_TABLET | Freq: Once | ORAL | Status: AC
  Administered 2017-10-27: 10 mg via ORAL
  Filled 2017-10-27: qty 1

## 2017-10-30 NOTE — Progress Notes (Signed)
[place]  Telephone:(336) [phone] Fax:(336) [phone]  ID: [name] OB: [birth year]  MR#: [id number]  UVO#:[id number]  Patient Care Team: Valerie Roys, DO as PCP - General (Family Medicine)  CHIEF COMPLAINT: Multiple myeloma.  INTERVAL HISTORY: Patient returns to clinic for further evaluation and consideration of cycle 1, day 8 of Cytoxan and Velcade.  He tolerated his first treatment well without significant side effects.  He continues to complain of a painful neuropathy in his bilateral hands.  He continues to have increased weakness and fatigue. He reports occasional dizziness as well. He denies any fevers or illnesses. He denies any easy bleeding or bruising. He denies any chest pain or shortness of breath. He has no nausea, vomiting, constipation, or diarrhea. He has no urinary complaints. Patient offers no further specific complaints today.  REVIEW OF SYSTEMS:   Review of Systems  Constitutional: Positive for malaise/fatigue. Negative for fever.  Respiratory: Negative.  Negative for cough and shortness of breath.   Cardiovascular: Negative.  Negative for chest pain and leg swelling.  Gastrointestinal: Negative.  Negative for abdominal pain, blood in stool and melena.  Musculoskeletal: Negative.   Skin: Negative.  Negative for rash.  Neurological: Positive for dizziness, tingling, sensory change and weakness.  Endo/Heme/Allergies: Does not bruise/bleed easily.  Psychiatric/Behavioral: Positive for substance abuse. The patient is not nervous/anxious.     As per HPI. Otherwise, a complete review of systems is negative.  PAST MEDICAL HISTORY: Past Medical History:  Diagnosis Date  . Arthritis   . Diabetes mellitus   . Dyspnea   . 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 Hypertension   . Hypothyroidism   . Neuropathy   . Peripheral vascular disease ([place])   . Venous stasis     PAST SURGICAL HISTORY: Past  Surgical History:  Procedure Laterality Date  . CATARACT EXTRACTION W/PHACO Right 10/25/2016   Procedure: CATARACT EXTRACTION PHACO AND INTRAOCULAR LENS PLACEMENT (IOC)  right eye complicated;  Surgeon: Ronnell Freshwater, MD;  Location: [facility];  Service: Ophthalmology;  Laterality: Right;  Right eye Diabetic - oral meds Vision blue  . FINGER SURGERY      FAMILY HISTORY Family History  Problem Relation Age of Onset  . Heart disease Mother   . Alzheimer's disease Sister   . Heart disease Maternal Aunt        ADVANCED DIRECTIVES:    HEALTH MAINTENANCE: Social History   Tobacco Use  . Smoking status: Former Smoker    Packs/day: 0.25    Types: Cigarettes    Last attempt to quit: 03/23/2015    Years since quitting: 2.6  . Smokeless tobacco: Current User    Types: Chew  Substance Use Topics  . Alcohol use: Yes    Alcohol/week: 25.2 oz    Types: 42 Cans of beer per week    Comment: 5-6 every day  . Drug use: No     Colonoscopy:  PAP:  Bone density:  Lipid panel:  Allergies  Allergen Reactions  . Metformin And Related Nausea And Vomiting    Current Outpatient Medications  Medication Sig Dispense Refill  . acyclovir (ZOVIRAX) 400 MG tablet Take 1 tablet (400 mg total) by mouth 2 (two) times daily. 60 tablet 3  . amitriptyline (ELAVIL) 25 MG tablet Take 1 tablet (25 mg total) by mouth at bedtime. 30 tablet 3  . dexamethasone (DECADRON) 4 MG tablet Take 10 tablets (40 mg)  on days 1, 8, and 15 of chemo. Repeat every 21 days. Take 2 tablets (65m) daily for 2 days starting after days 1 and 8 of chemotherapy. 30 tablet 3  . folic acid (FOLVITE) 1 MG tablet Take 1 tablet (1 mg total) by mouth daily. 30 tablet 2  . gabapentin (NEURONTIN) 300 MG capsule Take 1 capsule (300 mg total) by mouth 3 (three) times daily. 90 capsule 0  . JARDIANCE 10 MG TABS tablet     . levothyroxine (SYNTHROID, LEVOTHROID) 75 MCG tablet Take 1 tablet (75 mcg total) daily before  breakfast by mouth. 90 tablet 3  . lisinopril (PRINIVIL,ZESTRIL) 5 MG tablet Take 1 tablet (5 mg total) by mouth daily. 90 tablet 1  . ondansetron (ZOFRAN) 8 MG tablet     . prochlorperazine (COMPAZINE) 10 MG tablet Take 1 tablet (10 mg total) by mouth every 6 (six) hours as needed (Nausea or vomiting). 60 tablet 2  . thiamine (VITAMIN B-1) 100 MG tablet Take 1 tablet (100 mg total) by mouth daily. 30 tablet 2  . traMADol (ULTRAM) 50 MG tablet Take 1 tablet (50 mg total) by mouth every 6 (six) hours as needed. 30 tablet 0   Current Facility-Administered Medications  Medication Dose Route Frequency Provider Last Rate Last Dose  . cyanocobalamin ((VITAMIN B-12)) injection 1,000 mcg  1,000 mcg Intramuscular Q30 days JPark LiterP, DO   1,000 mcg at 10/21/17 1117    OBJECTIVE: Vitals:   10/31/17 1042  BP: (!) 131/58  Pulse: 75  Resp: 18  Temp: (!) 96.6 F (35.9 C)     Body mass index is 25.39 kg/m.    ECOG FS:0 - Asymptomatic  General: Well-developed, well-nourished, no acute distress. Eyes: Pink conjunctiva, anicteric sclera. Lungs: Clear to auscultation bilaterally. Heart: Regular rate and rhythm. No rubs, murmurs, or gallops. Abdomen: Soft, nontender, nondistended. No organomegaly noted, normoactive bowel sounds. Musculoskeletal: No edema, cyanosis, or clubbing. Neuro: Alert, answering all questions appropriately. Cranial nerves grossly intact. Skin: No rashes or petechiae noted. Psych: Normal affect.   LAB RESULTS:  Lab Results  Component Value Date   NA 129 (L) 10/31/2017   K 4.1 10/31/2017   CL 96 (L) 10/31/2017   CO2 25 10/31/2017   GLUCOSE 175 (H) 10/31/2017   BUN 10 10/31/2017   CREATININE 0.82 10/31/2017   CALCIUM 8.6 (L) 10/31/2017   PROT 5.6 (L) 10/31/2017   ALBUMIN 3.5 10/31/2017   AST 38 10/31/2017   ALT 24 10/31/2017   ALKPHOS 127 (H) 10/31/2017   BILITOT 1.2 10/31/2017   GFRNONAA >60 10/31/2017   GFRAA >60 10/31/2017    Lab Results  Component  Value Date   WBC 2.0 (L) 10/31/2017   NEUTROABS 1.4 10/31/2017   HGB 9.2 (L) 10/31/2017   HCT 26.1 (L) 10/31/2017   MCV 114.4 (H) 10/31/2017   PLT 57 (L) 10/31/2017     STUDIES: Dg Ribs Unilateral Right  Result Date: 10/21/2017 CLINICAL DATA:  Low back and right posterior rib pain following a fall 3 weeks ago and then again 3 days ago. Ex-smoker. EXAM: RIGHT RIBS - 2 VIEW COMPARISON:  None. FINDINGS: Stable increased density in the right upper lung zone due to fusion of the anterior portions of the right 2nd and 3rd ribs. No rib fracture or pneumothorax. The lungs remain mildly hyperexpanded. Interval small amount of linear density in the right lower lung zone. IMPRESSION: 1. No rib fracture or pneumothorax. 2. Stable mild changes of COPD. 3. Interval minimal  linear atelectasis or scarring in the right lower lung zone. Electronically Signed   By: Claudie Revering M.D.   On: 10/21/2017 18:20   Dg Lumbar Spine Complete  Result Date: 10/21/2017 CLINICAL DATA:  Low back pain for 3 weeks post fall EXAM: LUMBAR SPINE - COMPLETE 4+ VIEW COMPARISON:  None FINDINGS: Diffuse osseous demineralization. Five non-rib-bearing lumbar vertebra. Scattered endplate spur formation greatest at LEFT L2 through L3. Vertebral body heights maintained. No definite acute fracture, subluxation or bone destruction. No spondylolysis. IMPRESSION: Osseous demineralization with degenerative disc disease changes. No acute abnormalities. Electronically Signed   By: Lavonia Dana M.D.   On: 10/21/2017 18:16    ASSESSMENT: Multiple myeloma.  PLAN:  1.  Multiple myeloma: Bone marrow biopsy noted to have 29% plasma cells with kappa restriction.  Patient's kappa light chains are significantly elevated, although SPEP does not reveal an M spike. He has evidence of an organ damage with his pancytopenia, but his creatinine and calcium levels are within normal limits.  Patient did not go to his metastatic survey as scheduled. The patient will  benefit from chemotherapy, but given his alcoholism hesitant to use any oral regimen.  Patient will receive Cytoxan on days 1 and 8 along with Velcade on days 1, 4, 8, and 11.  He will also receive treatment doses dexamethasone.  We discussed possible port placement, but patient declined at this time.  Proceed with cycle 1, day 8 of treatment today.  Return to clinic on day 4, 8, and 11 for treatment as scheduled and then in 2 weeks for consideration of cycle 2, day 1. 2. Pancytopenia: Likely secondary to underlying multiple myeloma, though his heavy alcoholism is possibly contributing.  Have dose reduced both Cytoxan and Velcade from the onset. 3.  Elevated MCV: Patient's B12 and folate are within normal limits. 4.  Hypertension: Improved.  Unclear patient's compliance, continue management and treatment per PCP. 5.  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  Continue gabapentin and have recommended patient take folic acid, thiamine, and a multivitamin.  Approximately 30 minutes was spent in discussion of which greater than 50% was consultation.  Patient expressed understanding and was in agreement with this plan. He also understands that He can call clinic at any time with any questions, concerns, or complaints.    Martin Huger, MD   10/31/2017 11:11 AM

## 2017-10-31 ENCOUNTER — Encounter: Payer: Self-pay | Admitting: Oncology

## 2017-10-31 ENCOUNTER — Inpatient Hospital Stay (HOSPITAL_BASED_OUTPATIENT_CLINIC_OR_DEPARTMENT_OTHER): Payer: Medicare Other | Admitting: Oncology

## 2017-10-31 ENCOUNTER — Other Ambulatory Visit: Payer: Self-pay

## 2017-10-31 ENCOUNTER — Inpatient Hospital Stay: Payer: Medicare Other

## 2017-10-31 VITALS — BP 131/58 | HR 75 | Temp 96.6°F | Resp 18 | Wt 171.9 lb

## 2017-10-31 DIAGNOSIS — M79642 Pain in left hand: Secondary | ICD-10-CM | POA: Diagnosis not present

## 2017-10-31 DIAGNOSIS — C9000 Multiple myeloma not having achieved remission: Secondary | ICD-10-CM

## 2017-10-31 DIAGNOSIS — E119 Type 2 diabetes mellitus without complications: Secondary | ICD-10-CM | POA: Diagnosis not present

## 2017-10-31 DIAGNOSIS — I10 Essential (primary) hypertension: Secondary | ICD-10-CM

## 2017-10-31 DIAGNOSIS — D61818 Other pancytopenia: Secondary | ICD-10-CM

## 2017-10-31 DIAGNOSIS — G629 Polyneuropathy, unspecified: Secondary | ICD-10-CM | POA: Diagnosis not present

## 2017-10-31 DIAGNOSIS — M1990 Unspecified osteoarthritis, unspecified site: Secondary | ICD-10-CM | POA: Diagnosis not present

## 2017-10-31 DIAGNOSIS — R42 Dizziness and giddiness: Secondary | ICD-10-CM | POA: Diagnosis not present

## 2017-10-31 DIAGNOSIS — I739 Peripheral vascular disease, unspecified: Secondary | ICD-10-CM | POA: Diagnosis not present

## 2017-10-31 DIAGNOSIS — R5381 Other malaise: Secondary | ICD-10-CM | POA: Diagnosis not present

## 2017-10-31 DIAGNOSIS — R531 Weakness: Secondary | ICD-10-CM | POA: Diagnosis not present

## 2017-10-31 DIAGNOSIS — Z5112 Encounter for antineoplastic immunotherapy: Secondary | ICD-10-CM | POA: Diagnosis not present

## 2017-10-31 DIAGNOSIS — E039 Hypothyroidism, unspecified: Secondary | ICD-10-CM | POA: Diagnosis not present

## 2017-10-31 DIAGNOSIS — Z79899 Other long term (current) drug therapy: Secondary | ICD-10-CM | POA: Diagnosis not present

## 2017-10-31 DIAGNOSIS — R5383 Other fatigue: Secondary | ICD-10-CM | POA: Diagnosis not present

## 2017-10-31 DIAGNOSIS — M79641 Pain in right hand: Secondary | ICD-10-CM | POA: Diagnosis not present

## 2017-10-31 DIAGNOSIS — Z87891 Personal history of nicotine dependence: Secondary | ICD-10-CM | POA: Diagnosis not present

## 2017-10-31 LAB — CBC WITH DIFFERENTIAL/PLATELET
BASOS ABS: 0 10*3/uL (ref 0–0.1)
BASOS PCT: 1 %
Eosinophils Absolute: 0 10*3/uL (ref 0–0.7)
Eosinophils Relative: 1 %
HEMATOCRIT: 26.1 % — AB (ref 40.0–52.0)
Hemoglobin: 9.2 g/dL — ABNORMAL LOW (ref 13.0–18.0)
LYMPHS PCT: 21 %
Lymphs Abs: 0.4 10*3/uL — ABNORMAL LOW (ref 1.0–3.6)
MCH: 40.4 pg — ABNORMAL HIGH (ref 26.0–34.0)
MCHC: 35.3 g/dL (ref 32.0–36.0)
MCV: 114.4 fL — AB (ref 80.0–100.0)
Monocytes Absolute: 0.1 10*3/uL — ABNORMAL LOW (ref 0.2–1.0)
Monocytes Relative: 8 %
NEUTROS ABS: 1.4 10*3/uL (ref 1.4–6.5)
Neutrophils Relative %: 69 %
PLATELETS: 57 10*3/uL — AB (ref 150–440)
RBC: 2.28 MIL/uL — AB (ref 4.40–5.90)
RDW: 13.8 % (ref 11.5–14.5)
WBC: 2 10*3/uL — AB (ref 3.8–10.6)

## 2017-10-31 LAB — COMPREHENSIVE METABOLIC PANEL
ALBUMIN: 3.5 g/dL (ref 3.5–5.0)
ALT: 24 U/L (ref 17–63)
AST: 38 U/L (ref 15–41)
Alkaline Phosphatase: 127 U/L — ABNORMAL HIGH (ref 38–126)
Anion gap: 8 (ref 5–15)
BILIRUBIN TOTAL: 1.2 mg/dL (ref 0.3–1.2)
BUN: 10 mg/dL (ref 6–20)
CHLORIDE: 96 mmol/L — AB (ref 101–111)
CO2: 25 mmol/L (ref 22–32)
CREATININE: 0.82 mg/dL (ref 0.61–1.24)
Calcium: 8.6 mg/dL — ABNORMAL LOW (ref 8.9–10.3)
GFR calc Af Amer: >60 mL/min (ref >60)
GFR calc non Af Amer: >60 mL/min (ref >60)
GLUCOSE: 175 mg/dL — AB (ref 65–99)
Potassium: 4.1 mmol/L (ref 3.5–5.1)
Sodium: 129 mmol/L — ABNORMAL LOW (ref 135–145)
Total Protein: 5.6 g/dL — ABNORMAL LOW (ref 6.5–8.1)

## 2017-10-31 MED ORDER — PALONOSETRON HCL INJECTION 0.25 MG/5ML
0.2500 mg | Freq: Once | INTRAVENOUS | Status: AC
  Administered 2017-10-31: 0.25 mg via INTRAVENOUS
  Filled 2017-10-31: qty 5

## 2017-10-31 MED ORDER — SODIUM CHLORIDE 0.9 % IV SOLN
400.0000 mg/m2 | Freq: Once | INTRAVENOUS | Status: AC
  Administered 2017-10-31: 800 mg via INTRAVENOUS
  Filled 2017-10-31: qty 40

## 2017-10-31 MED ORDER — BORTEZOMIB CHEMO SQ INJECTION 3.5 MG (2.5MG/ML)
1.0000 mg/m2 | Freq: Once | INTRAMUSCULAR | Status: AC
  Administered 2017-10-31: 2 mg via SUBCUTANEOUS
  Filled 2017-10-31: qty 2

## 2017-10-31 MED ORDER — SODIUM CHLORIDE 0.9 % IV SOLN
INTRAVENOUS | Status: DC
  Administered 2017-10-31: 11:00:00 via INTRAVENOUS
  Filled 2017-10-31: qty 1000

## 2017-10-31 NOTE — Progress Notes (Signed)
Patient here today for follow up regarding multiple myeloma. Patient reports 10/10 back pain and numbness/tingling to both hands. Patient also reports decreased appetite.

## 2017-11-03 ENCOUNTER — Inpatient Hospital Stay: Payer: Medicare Other

## 2017-11-03 VITALS — BP 145/59 | HR 87 | Temp 96.8°F | Resp 18

## 2017-11-03 DIAGNOSIS — I739 Peripheral vascular disease, unspecified: Secondary | ICD-10-CM | POA: Diagnosis not present

## 2017-11-03 DIAGNOSIS — M79642 Pain in left hand: Secondary | ICD-10-CM | POA: Diagnosis not present

## 2017-11-03 DIAGNOSIS — M79641 Pain in right hand: Secondary | ICD-10-CM | POA: Diagnosis not present

## 2017-11-03 DIAGNOSIS — M1990 Unspecified osteoarthritis, unspecified site: Secondary | ICD-10-CM | POA: Diagnosis not present

## 2017-11-03 DIAGNOSIS — Z79899 Other long term (current) drug therapy: Secondary | ICD-10-CM | POA: Diagnosis not present

## 2017-11-03 DIAGNOSIS — Z5112 Encounter for antineoplastic immunotherapy: Secondary | ICD-10-CM | POA: Diagnosis not present

## 2017-11-03 DIAGNOSIS — R5381 Other malaise: Secondary | ICD-10-CM | POA: Diagnosis not present

## 2017-11-03 DIAGNOSIS — Z87891 Personal history of nicotine dependence: Secondary | ICD-10-CM | POA: Diagnosis not present

## 2017-11-03 DIAGNOSIS — E039 Hypothyroidism, unspecified: Secondary | ICD-10-CM | POA: Diagnosis not present

## 2017-11-03 DIAGNOSIS — R42 Dizziness and giddiness: Secondary | ICD-10-CM | POA: Diagnosis not present

## 2017-11-03 DIAGNOSIS — R5383 Other fatigue: Secondary | ICD-10-CM | POA: Diagnosis not present

## 2017-11-03 DIAGNOSIS — D61818 Other pancytopenia: Secondary | ICD-10-CM | POA: Diagnosis not present

## 2017-11-03 DIAGNOSIS — C9000 Multiple myeloma not having achieved remission: Secondary | ICD-10-CM

## 2017-11-03 DIAGNOSIS — R531 Weakness: Secondary | ICD-10-CM | POA: Diagnosis not present

## 2017-11-03 DIAGNOSIS — G629 Polyneuropathy, unspecified: Secondary | ICD-10-CM | POA: Diagnosis not present

## 2017-11-03 DIAGNOSIS — E119 Type 2 diabetes mellitus without complications: Secondary | ICD-10-CM | POA: Diagnosis not present

## 2017-11-03 DIAGNOSIS — I10 Essential (primary) hypertension: Secondary | ICD-10-CM | POA: Diagnosis not present

## 2017-11-03 MED ORDER — BORTEZOMIB CHEMO SQ INJECTION 3.5 MG (2.5MG/ML)
1.0000 mg/m2 | Freq: Once | INTRAMUSCULAR | Status: AC
  Administered 2017-11-03: 2 mg via SUBCUTANEOUS
  Filled 2017-11-03: qty 0.8

## 2017-11-03 MED ORDER — PROCHLORPERAZINE MALEATE 10 MG PO TABS
10.0000 mg | ORAL_TABLET | Freq: Once | ORAL | Status: AC
  Administered 2017-11-03: 10 mg via ORAL
  Filled 2017-11-03 (×2): qty 1

## 2017-11-03 NOTE — Progress Notes (Signed)
Per MD note 10/31/17 okay to proceed with 10/31/17 labs.

## 2017-11-14 ENCOUNTER — Inpatient Hospital Stay: Payer: Medicare Other | Attending: Nurse Practitioner

## 2017-11-14 ENCOUNTER — Inpatient Hospital Stay (HOSPITAL_BASED_OUTPATIENT_CLINIC_OR_DEPARTMENT_OTHER): Payer: Medicare Other | Admitting: Nurse Practitioner

## 2017-11-14 ENCOUNTER — Encounter: Payer: Self-pay | Admitting: Nurse Practitioner

## 2017-11-14 ENCOUNTER — Inpatient Hospital Stay: Payer: Medicare Other

## 2017-11-14 VITALS — BP 146/86 | HR 89 | Temp 97.3°F | Resp 20 | Wt 175.3 lb

## 2017-11-14 DIAGNOSIS — G629 Polyneuropathy, unspecified: Secondary | ICD-10-CM

## 2017-11-14 DIAGNOSIS — I10 Essential (primary) hypertension: Secondary | ICD-10-CM | POA: Insufficient documentation

## 2017-11-14 DIAGNOSIS — F1010 Alcohol abuse, uncomplicated: Secondary | ICD-10-CM | POA: Diagnosis not present

## 2017-11-14 DIAGNOSIS — Z5112 Encounter for antineoplastic immunotherapy: Secondary | ICD-10-CM | POA: Diagnosis not present

## 2017-11-14 DIAGNOSIS — D61818 Other pancytopenia: Secondary | ICD-10-CM | POA: Insufficient documentation

## 2017-11-14 DIAGNOSIS — D696 Thrombocytopenia, unspecified: Secondary | ICD-10-CM | POA: Insufficient documentation

## 2017-11-14 DIAGNOSIS — C9000 Multiple myeloma not having achieved remission: Secondary | ICD-10-CM

## 2017-11-14 DIAGNOSIS — Z5111 Encounter for antineoplastic chemotherapy: Secondary | ICD-10-CM | POA: Diagnosis not present

## 2017-11-14 LAB — COMPREHENSIVE METABOLIC PANEL
ALT: 25 U/L (ref 17–63)
AST: 42 U/L — ABNORMAL HIGH (ref 15–41)
Albumin: 3.6 g/dL (ref 3.5–5.0)
Alkaline Phosphatase: 144 U/L — ABNORMAL HIGH (ref 38–126)
Anion gap: 11 (ref 5–15)
BILIRUBIN TOTAL: 0.9 mg/dL (ref 0.3–1.2)
BUN: 8 mg/dL (ref 6–20)
CHLORIDE: 93 mmol/L — AB (ref 101–111)
CO2: 24 mmol/L (ref 22–32)
Calcium: 8.6 mg/dL — ABNORMAL LOW (ref 8.9–10.3)
Creatinine, Ser: 0.63 mg/dL (ref 0.61–1.24)
GFR calc Af Amer: >60 mL/min (ref >60)
Glucose, Bld: 189 mg/dL — ABNORMAL HIGH (ref 65–99)
POTASSIUM: 4.2 mmol/L (ref 3.5–5.1)
Sodium: 128 mmol/L — ABNORMAL LOW (ref 135–145)
TOTAL PROTEIN: 5.8 g/dL — AB (ref 6.5–8.1)

## 2017-11-14 LAB — CBC WITH DIFFERENTIAL/PLATELET
BASOS ABS: 0 10*3/uL (ref 0–0.1)
Basophils Relative: 0 %
EOS PCT: 1 %
Eosinophils Absolute: 0 10*3/uL (ref 0–0.7)
HCT: 23.3 % — ABNORMAL LOW (ref 40.0–52.0)
HEMOGLOBIN: 8.3 g/dL — AB (ref 13.0–18.0)
LYMPHS ABS: 0.3 10*3/uL — AB (ref 1.0–3.6)
LYMPHS PCT: 20 %
MCH: 40.4 pg — ABNORMAL HIGH (ref 26.0–34.0)
MCHC: 35.6 g/dL (ref 32.0–36.0)
MCV: 113.4 fL — AB (ref 80.0–100.0)
Monocytes Absolute: 0.2 10*3/uL (ref 0.2–1.0)
Monocytes Relative: 16 %
NEUTROS ABS: 0.8 10*3/uL — AB (ref 1.4–6.5)
Neutrophils Relative %: 63 %
PLATELETS: 69 10*3/uL — AB (ref 150–440)
RBC: 2.06 MIL/uL — AB (ref 4.40–5.90)
RDW: 14.3 % (ref 11.5–14.5)
WBC: 1.3 10*3/uL — CL (ref 3.8–10.6)

## 2017-11-14 MED ORDER — CAPSAICIN-MENTHOL-METHYL SAL 0.025-1-12 % EX CREA: TOPICAL_CREAM | CUTANEOUS | 2 refills | Status: DC

## 2017-11-14 NOTE — Progress Notes (Signed)
Patient here today for follow up regarding myeloma. Patient reports continued pain in his hands.

## 2017-11-14 NOTE — Progress Notes (Signed)
[place]  Telephone:(336) [phone] Fax:(336) [phone]  ID: REICE BIENVENUE OB: [birth year]  MR#: [id number]  XTG#:[id number]  Patient Care Team: Valerie Roys, DO as PCP - General (Family Medicine)  CHIEF COMPLAINT: Multiple myeloma.  INTERVAL HISTORY: Patient returns to clinic for further evaluation and consideration of cycle 2, day 1 of Cytoxan and Velcade.  He tolerated his first treatment well without significant side effects.  He continues to complain of a painful neuropathy in his bilateral hands. Was not able to tolerate gabapentin or tramadol d/t sleepiness. Continues to have weakness and fatigue which are stable and unchanged. He reports occasional dizziness as well. He denies any fevers or illnesses. He denies any easy bleeding or bruising. He denies any chest pain or shortness of breath. He has no nausea, vomiting, constipation, or diarrhea. He has no urinary complaints. Patient offers no further specific complaints today.  REVIEW OF SYSTEMS:   Review of Systems  Constitutional: Positive for malaise/fatigue. Negative for fever.  Respiratory: Negative.  Negative for cough and shortness of breath.   Cardiovascular: Negative.  Negative for chest pain and leg swelling.  Gastrointestinal: Negative.  Negative for abdominal pain, blood in stool and melena.  Musculoskeletal: Negative.   Skin: Negative.  Negative for rash.  Neurological: Positive for dizziness, tingling, sensory change and weakness.  Endo/Heme/Allergies: Does not bruise/bleed easily.  Psychiatric/Behavioral: Positive for substance abuse. The patient is not nervous/anxious.     As per HPI. Otherwise, a complete review of systems is negative.  PAST MEDICAL HISTORY: Past Medical History:  Diagnosis Date  . Arthritis   . Diabetes mellitus   . Dyspnea   . 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 Hypertension   . Hypothyroidism   . Neuropathy   . Peripheral  vascular disease ([place])   . Venous stasis     PAST SURGICAL HISTORY: Past Surgical History:  Procedure Laterality Date  . CATARACT EXTRACTION W/PHACO Right 10/25/2016   Procedure: CATARACT EXTRACTION PHACO AND INTRAOCULAR LENS PLACEMENT (IOC)  right eye complicated;  Surgeon: Ronnell Freshwater, MD;  Location: [facility];  Service: Ophthalmology;  Laterality: Right;  Right eye Diabetic - oral meds Vision blue  . FINGER SURGERY      FAMILY HISTORY Family History  Problem Relation Age of Onset  . Heart disease Mother   . Alzheimer's disease Sister   . Heart disease Maternal Aunt      ADVANCED DIRECTIVES:    HEALTH MAINTENANCE: Social History   Tobacco Use  . Smoking status: Former Smoker    Packs/day: 0.25    Types: Cigarettes    Last attempt to quit: 03/23/2015    Years since quitting: 2.6  . Smokeless tobacco: Current User    Types: Chew  Substance Use Topics  . Alcohol use: Yes    Alcohol/week: 25.2 oz    Types: 42 Cans of beer per week    Comment: 5-6 every day  . Drug use: No     Colonoscopy:  PAP:  Bone density:  Lipid panel:  Allergies  Allergen Reactions  . Metformin And Related Nausea And Vomiting    Current Outpatient Medications  Medication Sig Dispense Refill  . acyclovir (ZOVIRAX) 400 MG tablet Take 1 tablet (400 mg total) by mouth 2 (two) times daily. 60 tablet 3  . amitriptyline (ELAVIL) 25 MG tablet Take 1 tablet (25 mg total) by mouth at bedtime. 30 tablet 3  .  dexamethasone (DECADRON) 4 MG tablet Take 10 tablets (40 mg) on days 1, 8, and 15 of chemo. Repeat every 21 days. Take 2 tablets (35m) daily for 2 days starting after days 1 and 8 of chemotherapy. 30 tablet 3  . folic acid (FOLVITE) 1 MG tablet Take 1 tablet (1 mg total) by mouth daily. 30 tablet 2  . JARDIANCE 10 MG TABS tablet     . levothyroxine (SYNTHROID, LEVOTHROID) 75 MCG tablet Take 1 tablet (75 mcg total) daily before breakfast by mouth. 90 tablet 3  . lisinopril  (PRINIVIL,ZESTRIL) 5 MG tablet Take 1 tablet (5 mg total) by mouth daily. 90 tablet 1  . ondansetron (ZOFRAN) 8 MG tablet     . prochlorperazine (COMPAZINE) 10 MG tablet Take 1 tablet (10 mg total) by mouth every 6 (six) hours as needed (Nausea or vomiting). 60 tablet 2  . thiamine (VITAMIN B-1) 100 MG tablet Take 1 tablet (100 mg total) by mouth daily. 30 tablet 2  . gabapentin (NEURONTIN) 300 MG capsule Take 1 capsule (300 mg total) by mouth 3 (three) times daily. (Patient not taking: Reported on 11/14/2017) 90 capsule 0  . traMADol (ULTRAM) 50 MG tablet Take 1 tablet (50 mg total) by mouth every 6 (six) hours as needed. (Patient not taking: Reported on 11/14/2017) 30 tablet 0   Current Facility-Administered Medications  Medication Dose Route Frequency Provider Last Rate Last Dose  . cyanocobalamin ((VITAMIN B-12)) injection 1,000 mcg  1,000 mcg Intramuscular Q30 days JPark LiterP, DO   1,000 mcg at 10/21/17 1117    OBJECTIVE: Vitals:   11/14/17 1048  BP: (!) 146/86  Pulse: 89  Resp: 20  Temp: (!) 97.3 F (36.3 C)     Body mass index is 25.89 kg/m.    ECOG FS:0 - Asymptomatic  General: Well-developed, elderly male. Accompanied by grandson. Poor hygiene.  Eyes: Pink conjunctiva, anicteric sclera. Lungs: Clear to auscultation bilaterally. Heart: Regular rate and rhythm. No rubs, murmurs, or gallops. Abdomen: Soft, nontender, nondistended. No organomegaly noted, normoactive bowel sounds. Musculoskeletal: No edema, cyanosis, or clubbing. Cane for ambulation. Neuro: Alert, answering all questions appropriately. Cranial nerves grossly intact. Skin: No rashes or petechiae noted. Psych: Normal affect.   LAB RESULTS:  Lab Results  Component Value Date   NA 129 (L) 10/31/2017   K 4.1 10/31/2017   CL 96 (L) 10/31/2017   CO2 25 10/31/2017   GLUCOSE 175 (H) 10/31/2017   BUN 10 10/31/2017   CREATININE 0.82 10/31/2017   CALCIUM 8.6 (L) 10/31/2017   PROT 5.6 (L) 10/31/2017   ALBUMIN  3.5 10/31/2017   AST 38 10/31/2017   ALT 24 10/31/2017   ALKPHOS 127 (H) 10/31/2017   BILITOT 1.2 10/31/2017   GFRNONAA >60 10/31/2017   GFRAA >60 10/31/2017    Lab Results  Component Value Date   WBC 1.3 (LL) 11/14/2017   NEUTROABS 0.8 (L) 11/14/2017   HGB 8.3 (L) 11/14/2017   HCT 23.3 (L) 11/14/2017   MCV 113.4 (H) 11/14/2017   PLT 69 (L) 11/14/2017     STUDIES: Dg Ribs Unilateral Right  Result Date: 10/21/2017 CLINICAL DATA:  Low back and right posterior rib pain following a fall 3 weeks ago and then again 3 days ago. Ex-smoker. EXAM: RIGHT RIBS - 2 VIEW COMPARISON:  None. FINDINGS: Stable increased density in the right upper lung zone due to fusion of the anterior portions of the right 2nd and 3rd ribs. No rib fracture or pneumothorax. The lungs remain  mildly hyperexpanded. Interval small amount of linear density in the right lower lung zone. IMPRESSION: 1. No rib fracture or pneumothorax. 2. Stable mild changes of COPD. 3. Interval minimal linear atelectasis or scarring in the right lower lung zone. Electronically Signed   By: Claudie Revering M.D.   On: 10/21/2017 18:20   Dg Lumbar Spine Complete  Result Date: 10/21/2017 CLINICAL DATA:  Low back pain for 3 weeks post fall EXAM: LUMBAR SPINE - COMPLETE 4+ VIEW COMPARISON:  None FINDINGS: Diffuse osseous demineralization. Five non-rib-bearing lumbar vertebra. Scattered endplate spur formation greatest at LEFT L2 through L3. Vertebral body heights maintained. No definite acute fracture, subluxation or bone destruction. No spondylolysis. IMPRESSION: Osseous demineralization with degenerative disc disease changes. No acute abnormalities. Electronically Signed   By: Lavonia Dana M.D.   On: 10/21/2017 18:16    ASSESSMENT: Multiple myeloma.  PLAN:  1.  Multiple myeloma: Bone marrow biopsy noted to have 29% plasma cells with kappa restriction.  Kappa light chain 2764.4, kappa lambda light chain ratio elevated at 329.10.  No M spike  observed.  Evidence of endorgan damage given his pancytopenia.  Creatinine normal 0.63, calcium slightly decreased at 8.6-stable.  Metastatic bone survey previously scheduled, however, patient did not go.  History of alcoholism-continues to drink (see discussion below).  Currently on Cytoxan on days 1 and 8 along with Velcade on days 1, 4, 8, and 11.  Also receives treatment doses of dexamethasone.  Had previously discussed port placement patient had declined.  Patient has completed cycle 1.  WBC 1.3, ANC 0.8, PLT 69, HMG 8.3.  Will hold treatment today in light of decreased neutrophil count. Platelets historically low (see below) and have patient return to clinic in 1 week for repeat labs and re-evaluation.  2. Pancytopenia: Likely secondary to underlying multiple myeloma, though his heavy alcoholism is possibly contributing. Cytoxan and Velcade were dose reduced at onset. Continue to monitor.  3.  Elevated MCV: Patient's B12 and folate are within normal limits. Likely related to alcoholism. Encouraged to decrease drinking.  4.  Hypertension: Stable. Questionable compliance. Continue management and treatment per PCP.  5.  Peripheral neuropathy: Chronic and unchanged. Doubtful related to underlying myeloma. No worsen since starting chemotherapy. Possibly related to nerve damage due to heavy alcoholism. Unable to tolerate gabapentin and tramadol d/t sleepiness. Continue folic acid, thiamine, and multivitamin. Will send script for topical cream.    Approximately 30 minutes was spent in discussion of which greater than 50% was consultation.  Patient expressed understanding and was in agreement with this plan. He also understands that He can call clinic at any time with any questions, concerns, or complaints.    Verlon Au, NP   11/14/2017 2:20 PM

## 2017-11-17 ENCOUNTER — Ambulatory Visit: Payer: Medicare Other

## 2017-11-18 ENCOUNTER — Other Ambulatory Visit: Payer: Self-pay | Admitting: Oncology

## 2017-11-18 NOTE — Progress Notes (Signed)
[facility]  Telephone:(336) [phone] [phone]) [phone]  ID: [name] OB: [birth year]  MR#: [id number]  PNT#:[id number]  Patient Care Team: Valerie Roys, DO as PCP - General (Family Medicine)  CHIEF COMPLAINT: Multiple myeloma.  INTERVAL HISTORY: Patient returns to clinic for further evaluation and reconsideration of cycle 2, day 1 of Cytoxan and Velcade.  His treatment was delayed 1 week secondary to neutropenia. He continues to complain of a painful neuropathy in his bilateral hands.  He continues to have increased weakness and fatigue. He reports occasional dizziness as well. He denies any fevers or illnesses. He denies any easy bleeding or bruising. He denies any chest pain or shortness of breath. He has no nausea, vomiting, constipation, or diarrhea. He has no urinary complaints. Patient offers no further specific complaints today.  REVIEW OF SYSTEMS:   Review of Systems  Constitutional: Positive for malaise/fatigue. Negative for fever.  Respiratory: Negative.  Negative for cough and shortness of breath.   Cardiovascular: Negative.  Negative for chest pain and leg swelling.  Gastrointestinal: Negative.  Negative for abdominal pain, blood in stool and melena.  Musculoskeletal: Negative.   Skin: Negative.  Negative for rash.  Neurological: Positive for dizziness, tingling, sensory change and weakness.  Endo/Heme/Allergies: Does not bruise/bleed easily.  Psychiatric/Behavioral: Positive for substance abuse. The patient is not nervous/anxious.     As per HPI. Otherwise, a complete review of systems is negative.  PAST MEDICAL HISTORY: Past Medical History:  Diagnosis Date  . Arthritis   . Diabetes mellitus   . Dyspnea   . 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 Hypertension   . Hypothyroidism   . Neuropathy   . Peripheral vascular disease ([place])   . Venous stasis     PAST SURGICAL HISTORY: Past Surgical History:   Procedure Laterality Date  . CATARACT EXTRACTION W/PHACO Right 10/25/2016   Procedure: CATARACT EXTRACTION PHACO AND INTRAOCULAR LENS PLACEMENT (IOC)  right eye complicated;  Surgeon: Ronnell Freshwater, MD;  Location: [facility];  Service: Ophthalmology;  Laterality: Right;  Right eye Diabetic - oral meds Vision blue  . FINGER SURGERY      FAMILY HISTORY Family History  Problem Relation Age of Onset  . Heart disease Mother   . Alzheimer's disease Sister   . Heart disease Maternal Aunt        ADVANCED DIRECTIVES:    HEALTH MAINTENANCE: Social History   Tobacco Use  . Smoking status: Former Smoker    Packs/day: 0.25    Types: Cigarettes    Last attempt to quit: 03/23/2015    Years since quitting: 2.6  . Smokeless tobacco: Current User    Types: Chew  Substance Use Topics  . Alcohol use: Yes    Alcohol/week: 25.2 oz    Types: 42 Cans of beer per week    Comment: 5-6 every day  . Drug use: No     Colonoscopy:  PAP:  Bone density:  Lipid panel:  Allergies  Allergen Reactions  . Metformin And Related Nausea And Vomiting    Current Outpatient Medications  Medication Sig Dispense Refill  . acyclovir (ZOVIRAX) 400 MG tablet Take 1 tablet (400 mg total) by mouth 2 (two) times daily. 60 tablet 3  . amitriptyline (ELAVIL) 25 MG tablet Take 1 tablet (25 mg total) by mouth at bedtime. 30 tablet 3  . Capsaicin-Menthol-Methyl Sal (CAPSAICIN-METHYL SAL-MENTHOL) 0.025-1-12 % CREA Apply to hands 3-4 times  a day as needed for pain 56.6 g 2  . dexamethasone (DECADRON) 4 MG tablet Take 10 tablets (40 mg) on days 1, 8, and 15 of chemo. Repeat every 21 days. Take 2 tablets (76m) daily for 2 days starting after days 1 and 8 of chemotherapy. 30 tablet 3  . folic acid (FOLVITE) 1 MG tablet Take 1 tablet (1 mg total) by mouth daily. 30 tablet 2  . JARDIANCE 10 MG TABS tablet     . levothyroxine (SYNTHROID, LEVOTHROID) 75 MCG tablet Take 1 tablet (75 mcg total) daily  before breakfast by mouth. 90 tablet 3  . lisinopril (PRINIVIL,ZESTRIL) 5 MG tablet Take 1 tablet (5 mg total) by mouth daily. 90 tablet 1  . ondansetron (ZOFRAN) 8 MG tablet     . prochlorperazine (COMPAZINE) 10 MG tablet Take 1 tablet (10 mg total) by mouth every 6 (six) hours as needed (Nausea or vomiting). 60 tablet 2  . thiamine (VITAMIN B-1) 100 MG tablet Take 1 tablet (100 mg total) by mouth daily. 30 tablet 2   Current Facility-Administered Medications  Medication Dose Route Frequency Provider Last Rate Last Dose  . cyanocobalamin ((VITAMIN B-12)) injection 1,000 mcg  1,000 mcg Intramuscular Q30 days JPark LiterP, DO   1,000 mcg at 10/21/17 1117    OBJECTIVE: Vitals:   11/21/17 1020  BP: (!) 158/69  Pulse: 90  Resp: 20  Temp: 97.6 F (36.4 C)     Body mass index is 26.18 kg/m.    ECOG FS:0 - Asymptomatic  General: Well-developed, well-nourished, no acute distress. Eyes: Pink conjunctiva, anicteric sclera. Lungs: Clear to auscultation bilaterally. Heart: Regular rate and rhythm. No rubs, murmurs, or gallops. Abdomen: Soft, nontender, nondistended. No organomegaly noted, normoactive bowel sounds. Musculoskeletal: No edema, cyanosis, or clubbing. Neuro: Alert, answering all questions appropriately. Cranial nerves grossly intact. Skin: No rashes or petechiae noted. Psych: Normal affect.   LAB RESULTS:  Lab Results  Component Value Date   NA 130 (L) 11/21/2017   K 4.5 11/21/2017   CL 99 (L) 11/21/2017   CO2 27 11/21/2017   GLUCOSE 155 (H) 11/21/2017   BUN 12 11/21/2017   CREATININE 0.67 11/21/2017   CALCIUM 9.1 11/21/2017   PROT 6.1 (L) 11/21/2017   ALBUMIN 3.8 11/21/2017   AST 40 11/21/2017   ALT 22 11/21/2017   ALKPHOS 141 (H) 11/21/2017   BILITOT 1.6 (H) 11/21/2017   GFRNONAA >60 11/21/2017   GFRAA >60 11/21/2017    Lab Results  Component Value Date   WBC 2.6 (L) 11/21/2017   NEUTROABS 2.0 11/21/2017   HGB 8.8 (L) 11/21/2017   HCT 25.1 (L)  11/21/2017   MCV 116.9 (H) 11/21/2017   PLT 57 (L) 11/21/2017     STUDIES: No results found.  ASSESSMENT: Multiple myeloma.  PLAN:  1.  Multiple myeloma: Bone marrow biopsy noted to have 29% plasma cells with kappa restriction.  Patient's kappa light chains are significantly elevated, although SPEP does not reveal an M spike. He has evidence of an organ damage with his pancytopenia, but his creatinine and calcium levels are within normal limits.  Patient did not go to his metastatic survey as scheduled. The patient will benefit from chemotherapy, but given his alcoholism hesitant to use any oral regimen.  Patient will receive Cytoxan on days 1 and 8 along with Velcade on days 1, 4, 8, and 11.  He will also receive treatment doses dexamethasone.  We discussed possible port placement, but patient declined at this  time.  Proceed with cycle 2, day 1 of treatment today.  Return to clinic on day 4, 8, and 11 for treatment as scheduled and then in 2 weeks for consideration of cycle 3, day 1. 2. Pancytopenia: Likely secondary to underlying multiple myeloma, though his heavy alcoholism is possibly contributing.  Have dose reduced both Cytoxan and Velcade from the onset. 3.  Elevated MCV: Patient's B12 and folate are within normal limits. 4.  Hypertension: Improved.  Unclear patient's compliance, continue management and treatment per PCP. 5.  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  Continue gabapentin and have recommended patient take folic acid, thiamine, and a multivitamin. 6.  Neutropenia: Significantly improved.  Proceed with treatment as above.  Approximately 30 minutes was spent in discussion of which greater than 50% was consultation.  Patient expressed understanding and was in agreement with this plan. He also understands that He can call clinic at any time with any questions, concerns, or complaints.      Lloyd Huger, MD   11/22/2017 1:03 PM

## 2017-11-21 ENCOUNTER — Inpatient Hospital Stay: Payer: Medicare Other

## 2017-11-21 ENCOUNTER — Inpatient Hospital Stay (HOSPITAL_BASED_OUTPATIENT_CLINIC_OR_DEPARTMENT_OTHER): Payer: Medicare Other | Admitting: Oncology

## 2017-11-21 VITALS — BP 158/69 | HR 90 | Temp 97.6°F | Resp 20 | Wt 177.3 lb

## 2017-11-21 DIAGNOSIS — F1010 Alcohol abuse, uncomplicated: Secondary | ICD-10-CM

## 2017-11-21 DIAGNOSIS — Z5112 Encounter for antineoplastic immunotherapy: Secondary | ICD-10-CM | POA: Diagnosis not present

## 2017-11-21 DIAGNOSIS — D61818 Other pancytopenia: Secondary | ICD-10-CM | POA: Diagnosis not present

## 2017-11-21 DIAGNOSIS — C9000 Multiple myeloma not having achieved remission: Secondary | ICD-10-CM

## 2017-11-21 DIAGNOSIS — D696 Thrombocytopenia, unspecified: Secondary | ICD-10-CM | POA: Diagnosis not present

## 2017-11-21 DIAGNOSIS — G629 Polyneuropathy, unspecified: Secondary | ICD-10-CM | POA: Diagnosis not present

## 2017-11-21 DIAGNOSIS — I10 Essential (primary) hypertension: Secondary | ICD-10-CM

## 2017-11-21 DIAGNOSIS — Z5111 Encounter for antineoplastic chemotherapy: Secondary | ICD-10-CM | POA: Diagnosis not present

## 2017-11-21 LAB — COMPREHENSIVE METABOLIC PANEL
ALBUMIN: 3.8 g/dL (ref 3.5–5.0)
ALT: 22 U/L (ref 17–63)
AST: 40 U/L (ref 15–41)
Alkaline Phosphatase: 141 U/L — ABNORMAL HIGH (ref 38–126)
Anion gap: 4 — ABNORMAL LOW (ref 5–15)
BUN: 12 mg/dL (ref 6–20)
CHLORIDE: 99 mmol/L — AB (ref 101–111)
CO2: 27 mmol/L (ref 22–32)
Calcium: 9.1 mg/dL (ref 8.9–10.3)
Creatinine, Ser: 0.67 mg/dL (ref 0.61–1.24)
GFR calc Af Amer: >60 mL/min (ref >60)
GFR calc non Af Amer: >60 mL/min (ref >60)
GLUCOSE: 155 mg/dL — AB (ref 65–99)
Potassium: 4.5 mmol/L (ref 3.5–5.1)
SODIUM: 130 mmol/L — AB (ref 135–145)
Total Bilirubin: 1.6 mg/dL — ABNORMAL HIGH (ref 0.3–1.2)
Total Protein: 6.1 g/dL — ABNORMAL LOW (ref 6.5–8.1)

## 2017-11-21 LAB — CBC WITH DIFFERENTIAL/PLATELET
Basophils Absolute: 0 10*3/uL (ref 0–0.1)
Basophils Relative: 0 %
EOS ABS: 0 10*3/uL (ref 0–0.7)
EOS PCT: 0 %
HCT: 25.1 % — ABNORMAL LOW (ref 40.0–52.0)
Hemoglobin: 8.8 g/dL — ABNORMAL LOW (ref 13.0–18.0)
LYMPHS ABS: 0.3 10*3/uL — AB (ref 1.0–3.6)
LYMPHS PCT: 11 %
MCH: 40.9 pg — AB (ref 26.0–34.0)
MCHC: 35 g/dL (ref 32.0–36.0)
MCV: 116.9 fL — AB (ref 80.0–100.0)
MONOS PCT: 10 %
Monocytes Absolute: 0.3 10*3/uL (ref 0.2–1.0)
Neutro Abs: 2 10*3/uL (ref 1.4–6.5)
Neutrophils Relative %: 79 %
PLATELETS: 57 10*3/uL — AB (ref 150–440)
RBC: 2.14 MIL/uL — ABNORMAL LOW (ref 4.40–5.90)
RDW: 15.3 % — ABNORMAL HIGH (ref 11.5–14.5)
WBC: 2.6 10*3/uL — ABNORMAL LOW (ref 3.8–10.6)

## 2017-11-21 MED ORDER — BORTEZOMIB CHEMO SQ INJECTION 3.5 MG (2.5MG/ML)
1.0000 mg/m2 | Freq: Once | INTRAMUSCULAR | Status: AC
  Administered 2017-11-21: 2 mg via SUBCUTANEOUS
  Filled 2017-11-21: qty 2

## 2017-11-21 MED ORDER — SODIUM CHLORIDE 0.9 % IV SOLN
400.0000 mg/m2 | Freq: Once | INTRAVENOUS | Status: AC
  Administered 2017-11-21: 800 mg via INTRAVENOUS
  Filled 2017-11-21: qty 40

## 2017-11-21 MED ORDER — PALONOSETRON HCL INJECTION 0.25 MG/5ML
0.2500 mg | Freq: Once | INTRAVENOUS | Status: AC
Start: 2017-11-21 — End: 2017-11-21
  Administered 2017-11-21: 0.25 mg via INTRAVENOUS
  Filled 2017-11-21: qty 5

## 2017-11-21 MED ORDER — SODIUM CHLORIDE 0.9 % IV SOLN
INTRAVENOUS | Status: DC
  Administered 2017-11-21: 12:00:00 via INTRAVENOUS
  Filled 2017-11-21: qty 1000

## 2017-11-21 NOTE — Progress Notes (Signed)
Pt in today with son for follow up visit with Dr Grayland Ormond, states "things are the same, no difference".  Pt requested not to have "male provider who saw him last time see him again".  Very angry, assured pt Dr Grayland Ormond would be seeing him today.

## 2017-11-22 ENCOUNTER — Telehealth: Payer: Self-pay | Admitting: Family Medicine

## 2017-11-22 ENCOUNTER — Ambulatory Visit: Payer: Medicare Other | Admitting: Family Medicine

## 2017-11-22 NOTE — Telephone Encounter (Signed)
Pt. Cancelling today's appointment - states he is "too weak from my treatment yesterday to make it in.I just don't have anything in me." Will call back to reschedule.

## 2017-11-24 ENCOUNTER — Other Ambulatory Visit: Payer: Medicare Other

## 2017-11-24 ENCOUNTER — Ambulatory Visit: Payer: Medicare Other | Admitting: Oncology

## 2017-11-24 ENCOUNTER — Inpatient Hospital Stay: Payer: Medicare Other

## 2017-11-24 VITALS — BP 154/66 | HR 88 | Temp 97.6°F | Resp 20

## 2017-11-24 DIAGNOSIS — G629 Polyneuropathy, unspecified: Secondary | ICD-10-CM | POA: Diagnosis not present

## 2017-11-24 DIAGNOSIS — C9000 Multiple myeloma not having achieved remission: Secondary | ICD-10-CM | POA: Diagnosis not present

## 2017-11-24 DIAGNOSIS — D61818 Other pancytopenia: Secondary | ICD-10-CM | POA: Diagnosis not present

## 2017-11-24 DIAGNOSIS — I10 Essential (primary) hypertension: Secondary | ICD-10-CM | POA: Diagnosis not present

## 2017-11-24 DIAGNOSIS — Z5111 Encounter for antineoplastic chemotherapy: Secondary | ICD-10-CM | POA: Diagnosis not present

## 2017-11-24 DIAGNOSIS — D696 Thrombocytopenia, unspecified: Secondary | ICD-10-CM | POA: Diagnosis not present

## 2017-11-24 DIAGNOSIS — Z5112 Encounter for antineoplastic immunotherapy: Secondary | ICD-10-CM | POA: Diagnosis not present

## 2017-11-24 MED ORDER — BORTEZOMIB CHEMO SQ INJECTION 3.5 MG (2.5MG/ML)
1.0000 mg/m2 | Freq: Once | INTRAMUSCULAR | Status: AC
  Administered 2017-11-24: 2 mg via SUBCUTANEOUS
  Filled 2017-11-24: qty 2

## 2017-11-24 MED ORDER — PROCHLORPERAZINE MALEATE 10 MG PO TABS
10.0000 mg | ORAL_TABLET | Freq: Once | ORAL | Status: AC
  Administered 2017-11-24: 10 mg via ORAL
  Filled 2017-11-24: qty 1

## 2017-11-28 ENCOUNTER — Inpatient Hospital Stay: Payer: Medicare Other

## 2017-11-28 ENCOUNTER — Telehealth: Payer: Self-pay | Admitting: *Deleted

## 2017-11-28 DIAGNOSIS — I10 Essential (primary) hypertension: Secondary | ICD-10-CM | POA: Diagnosis not present

## 2017-11-28 DIAGNOSIS — D696 Thrombocytopenia, unspecified: Secondary | ICD-10-CM | POA: Diagnosis not present

## 2017-11-28 DIAGNOSIS — D61818 Other pancytopenia: Secondary | ICD-10-CM | POA: Diagnosis not present

## 2017-11-28 DIAGNOSIS — C9000 Multiple myeloma not having achieved remission: Secondary | ICD-10-CM

## 2017-11-28 DIAGNOSIS — Z5112 Encounter for antineoplastic immunotherapy: Secondary | ICD-10-CM | POA: Diagnosis not present

## 2017-11-28 DIAGNOSIS — Z5111 Encounter for antineoplastic chemotherapy: Secondary | ICD-10-CM | POA: Diagnosis not present

## 2017-11-28 DIAGNOSIS — G629 Polyneuropathy, unspecified: Secondary | ICD-10-CM | POA: Diagnosis not present

## 2017-11-28 LAB — CBC WITH DIFFERENTIAL/PLATELET
BASOS ABS: 0 10*3/uL (ref 0–0.1)
BASOS PCT: 1 %
EOS PCT: 3 %
Eosinophils Absolute: 0 10*3/uL (ref 0–0.7)
HCT: 22.6 % — ABNORMAL LOW (ref 40.0–52.0)
Hemoglobin: 7.9 g/dL — ABNORMAL LOW (ref 13.0–18.0)
Lymphocytes Relative: 19 %
Lymphs Abs: 0.3 10*3/uL — ABNORMAL LOW (ref 1.0–3.6)
MCH: 40.5 pg — ABNORMAL HIGH (ref 26.0–34.0)
MCHC: 35 g/dL (ref 32.0–36.0)
MCV: 115.6 fL — ABNORMAL HIGH (ref 80.0–100.0)
Monocytes Absolute: 0.1 10*3/uL — ABNORMAL LOW (ref 0.2–1.0)
Monocytes Relative: 5 %
Neutro Abs: 1 10*3/uL — ABNORMAL LOW (ref 1.4–6.5)
Neutrophils Relative %: 72 %
PLATELETS: 23 10*3/uL — AB (ref 150–400)
RBC: 1.95 MIL/uL — ABNORMAL LOW (ref 4.40–5.90)
RDW: 14.8 % — AB (ref 11.5–14.5)
WBC: 1.4 10*3/uL — CL (ref 3.8–10.6)

## 2017-11-28 LAB — COMPREHENSIVE METABOLIC PANEL
ALBUMIN: 3.3 g/dL — AB (ref 3.5–5.0)
ALK PHOS: 117 U/L (ref 38–126)
ALT: 24 U/L (ref 17–63)
AST: 37 U/L (ref 15–41)
Anion gap: 7 (ref 5–15)
BILIRUBIN TOTAL: 0.7 mg/dL (ref 0.3–1.2)
BUN: 12 mg/dL (ref 6–20)
CALCIUM: 8.8 mg/dL — AB (ref 8.9–10.3)
CO2: 24 mmol/L (ref 22–32)
Chloride: 98 mmol/L — ABNORMAL LOW (ref 101–111)
Creatinine, Ser: 0.77 mg/dL (ref 0.61–1.24)
GFR calc Af Amer: >60 mL/min (ref >60)
GLUCOSE: 171 mg/dL — AB (ref 65–99)
Potassium: 4.3 mmol/L (ref 3.5–5.1)
Sodium: 129 mmol/L — ABNORMAL LOW (ref 135–145)
TOTAL PROTEIN: 5.3 g/dL — AB (ref 6.5–8.1)

## 2017-11-28 NOTE — Telephone Encounter (Signed)
Lonnie from lab called and reports plt 23 K. I notified doctor and infusion of this and doctor ordered no chemotherapy today no transfusion either

## 2017-11-28 NOTE — Progress Notes (Signed)
Per Dr. Grayland Ormond no treatment or transfusion needed at this time, okay to discharge home, pt to report to clinic Thursday as scheduled with labs prior to possible velcade. Pt aware and denies any abnormal bleeding or any other concerns or complaints at this time. Pt to call clinic if any new symptoms occur. Pt verbalizes understanding. Pt stable at discharge.

## 2017-12-01 ENCOUNTER — Inpatient Hospital Stay: Payer: Medicare Other

## 2017-12-01 DIAGNOSIS — Z5111 Encounter for antineoplastic chemotherapy: Secondary | ICD-10-CM | POA: Diagnosis not present

## 2017-12-01 DIAGNOSIS — C9000 Multiple myeloma not having achieved remission: Secondary | ICD-10-CM

## 2017-12-01 DIAGNOSIS — D61818 Other pancytopenia: Secondary | ICD-10-CM | POA: Diagnosis not present

## 2017-12-01 DIAGNOSIS — Z5112 Encounter for antineoplastic immunotherapy: Secondary | ICD-10-CM | POA: Diagnosis not present

## 2017-12-01 DIAGNOSIS — D696 Thrombocytopenia, unspecified: Secondary | ICD-10-CM | POA: Diagnosis not present

## 2017-12-01 DIAGNOSIS — I10 Essential (primary) hypertension: Secondary | ICD-10-CM | POA: Diagnosis not present

## 2017-12-01 DIAGNOSIS — G629 Polyneuropathy, unspecified: Secondary | ICD-10-CM | POA: Diagnosis not present

## 2017-12-01 LAB — COMPREHENSIVE METABOLIC PANEL
ALBUMIN: 3.5 g/dL (ref 3.5–5.0)
ALK PHOS: 108 U/L (ref 38–126)
ALT: 23 U/L (ref 17–63)
AST: 30 U/L (ref 15–41)
Anion gap: 6 (ref 5–15)
BUN: 8 mg/dL (ref 6–20)
CALCIUM: 8.9 mg/dL (ref 8.9–10.3)
CHLORIDE: 103 mmol/L (ref 101–111)
CO2: 24 mmol/L (ref 22–32)
CREATININE: 0.57 mg/dL — AB (ref 0.61–1.24)
GFR calc Af Amer: >60 mL/min (ref >60)
GFR calc non Af Amer: >60 mL/min (ref >60)
GLUCOSE: 101 mg/dL — AB (ref 65–99)
Potassium: 4.3 mmol/L (ref 3.5–5.1)
Sodium: 133 mmol/L — ABNORMAL LOW (ref 135–145)
Total Bilirubin: 0.8 mg/dL (ref 0.3–1.2)
Total Protein: 5.6 g/dL — ABNORMAL LOW (ref 6.5–8.1)

## 2017-12-01 LAB — CBC WITH DIFFERENTIAL/PLATELET
Basophils Absolute: 0 10*3/uL (ref 0–0.1)
Basophils Relative: 1 %
EOS ABS: 0.1 10*3/uL (ref 0–0.7)
Eosinophils Relative: 6 %
HCT: 24.5 % — ABNORMAL LOW (ref 40.0–52.0)
HEMOGLOBIN: 8.5 g/dL — AB (ref 13.0–18.0)
LYMPHS ABS: 0.4 10*3/uL — AB (ref 1.0–3.6)
LYMPHS PCT: 26 %
MCH: 40.3 pg — AB (ref 26.0–34.0)
MCHC: 34.7 g/dL (ref 32.0–36.0)
MCV: 116.2 fL — ABNORMAL HIGH (ref 80.0–100.0)
Monocytes Absolute: 0.1 10*3/uL — ABNORMAL LOW (ref 0.2–1.0)
Monocytes Relative: 5 %
NEUTROS PCT: 62 %
Neutro Abs: 0.9 10*3/uL — ABNORMAL LOW (ref 1.4–6.5)
Platelets: 39 10*3/uL — ABNORMAL LOW (ref 150–440)
RBC: 2.1 MIL/uL — AB (ref 4.40–5.90)
RDW: 15.4 % — ABNORMAL HIGH (ref 11.5–14.5)
WBC: 1.4 10*3/uL — AB (ref 4.0–10.5)

## 2017-12-01 NOTE — Progress Notes (Unsigned)
No treatment today per Dr Grayland Ormond, labs outside of parameters. Pt given copy of labs and informed to return on next scheduled follow up, pt and son verbalize understanding.

## 2017-12-09 ENCOUNTER — Other Ambulatory Visit: Payer: Self-pay | Admitting: Oncology

## 2017-12-09 ENCOUNTER — Other Ambulatory Visit: Payer: Self-pay | Admitting: *Deleted

## 2017-12-09 DIAGNOSIS — C9000 Multiple myeloma not having achieved remission: Secondary | ICD-10-CM

## 2017-12-10 NOTE — Progress Notes (Signed)
[facility]  Telephone:(336) [phone] Fax:(336) [phone]  ID: [name] OB: 01-30-1941  MR#: [id number]  SAY#:[id number]  Patient Care Team: Valerie Roys, DO as PCP - General (Family Medicine)  CHIEF COMPLAINT: Multiple myeloma.  INTERVAL HISTORY: Patient returns to clinic for further evaluation and consideration of his next treatment of Cytoxan and Velcade.  He continues to have persistent pancytopenia, but otherwise feels well.  The neuropathy in his hands is slightly improved today.  He continues to have increased weakness and fatigue. He reports occasional dizziness as well. He denies any fevers or illnesses. He denies any easy bleeding or bruising. He denies any chest pain or shortness of breath. He has no nausea, vomiting, constipation, or diarrhea. He has no urinary complaints. Patient offers no further specific complaints today.  REVIEW OF SYSTEMS:   Review of Systems  Constitutional: Positive for malaise/fatigue. Negative for fever.  Respiratory: Negative.  Negative for cough and shortness of breath.   Cardiovascular: Negative.  Negative for chest pain and leg swelling.  Gastrointestinal: Negative.  Negative for abdominal pain, blood in stool and melena.  Musculoskeletal: Negative.   Skin: Negative.  Negative for rash.  Neurological: Positive for dizziness, tingling, sensory change and weakness.  Endo/Heme/Allergies: Does not bruise/bleed easily.  Psychiatric/Behavioral: Positive for substance abuse. The patient is not nervous/anxious.     As per HPI. Otherwise, a complete review of systems is negative.  PAST MEDICAL HISTORY: Past Medical History:  Diagnosis Date  . Arthritis   . Diabetes mellitus   . Dyspnea   . 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 Hypertension   . Hypothyroidism   . Neuropathy   . Peripheral vascular disease ([place])   . Venous stasis     PAST SURGICAL HISTORY: Past Surgical History:    Procedure Laterality Date  . CATARACT EXTRACTION W/PHACO Right 10/25/2016   Procedure: CATARACT EXTRACTION PHACO AND INTRAOCULAR LENS PLACEMENT (IOC)  right eye complicated;  Surgeon: Ronnell Freshwater, MD;  Location: [facility];  Service: Ophthalmology;  Laterality: Right;  Right eye Diabetic - oral meds Vision blue  . FINGER SURGERY      FAMILY HISTORY Family History  Problem Relation Age of Onset  . Heart disease Mother   . Alzheimer's disease Sister   . Heart disease Maternal Aunt        ADVANCED DIRECTIVES:    HEALTH MAINTENANCE: Social History   Tobacco Use  . Smoking status: Former Smoker    Packs/day: 0.25    Types: Cigarettes    Last attempt to quit: 03/23/2015    Years since quitting: 2.7  . Smokeless tobacco: Current User    Types: Chew  Substance Use Topics  . Alcohol use: Yes    Alcohol/week: 25.2 oz    Types: 42 Cans of beer per week    Comment: 5-6 every day  . Drug use: No     Colonoscopy:  PAP:  Bone density:  Lipid panel:  Allergies  Allergen Reactions  . Metformin And Related Nausea And Vomiting    Current Outpatient Medications  Medication Sig Dispense Refill  . folic acid (FOLVITE) 1 MG tablet Take 1 tablet (1 mg total) by mouth daily. 30 tablet 2  . acyclovir (ZOVIRAX) 400 MG tablet Take 1 tablet (400 mg total) by mouth 2 (two) times daily. (Patient not taking: Reported on 12/12/2017) 60 tablet 3  . Capsaicin-Menthol-Methyl Sal (CAPSAICIN-METHYL SAL-MENTHOL) 0.025-1-12 % CREA  Apply to hands 3-4 times a day as needed for pain (Patient not taking: Reported on 12/12/2017) 56.6 g 2  . dexamethasone (DECADRON) 4 MG tablet Take 10 tablets (40 mg) on days 1, 8, and 15 of chemo. Repeat every 21 days. Take 2 tablets (71m) daily for 2 days starting after days 1 and 8 of chemotherapy. (Patient not taking: Reported on 12/12/2017) 30 tablet 3  . JARDIANCE 10 MG TABS tablet     . levothyroxine (SYNTHROID, LEVOTHROID) 75 MCG tablet Take 1  tablet (75 mcg total) daily before breakfast by mouth. (Patient not taking: Reported on 12/12/2017) 90 tablet 3  . lisinopril (PRINIVIL,ZESTRIL) 5 MG tablet Take 1 tablet (5 mg total) by mouth daily. (Patient not taking: Reported on 12/12/2017) 90 tablet 1  . ondansetron (ZOFRAN) 8 MG tablet     . prochlorperazine (COMPAZINE) 10 MG tablet Take 1 tablet (10 mg total) by mouth every 6 (six) hours as needed (Nausea or vomiting). (Patient not taking: Reported on 12/12/2017) 60 tablet 2  . thiamine (VITAMIN B-1) 100 MG tablet Take 1 tablet (100 mg total) by mouth daily. (Patient not taking: Reported on 12/12/2017) 30 tablet 2   Current Facility-Administered Medications  Medication Dose Route Frequency Provider Last Rate Last Dose  . cyanocobalamin ((VITAMIN B-12)) injection 1,000 mcg  1,000 mcg Intramuscular Q30 days JWynetta Emery Megan P, DO   1,000 mcg at 10/21/17 1117   Facility-Administered Medications Ordered in Other Visits  Medication Dose Route Frequency Provider Last Rate Last Dose  . bortezomib SQ (VELCADE) chemo injection 2 mg  1 mg/m2 (Order-Specific) Subcutaneous Once FLloyd Huger MD        OBJECTIVE: Vitals:   12/12/17 0933  BP: (!) 163/60  Pulse: 80  Resp: 18  Temp: (!) 95.6 F (35.3 C)     Body mass index is 25.59 kg/m.    ECOG FS:0 - Asymptomatic  General: Well-developed, well-nourished, no acute distress. Eyes: Pink conjunctiva, anicteric sclera. Lungs: Clear to auscultation bilaterally. Heart: Regular rate and rhythm. No rubs, murmurs, or gallops. Abdomen: Soft, nontender, nondistended. No organomegaly noted, normoactive bowel sounds. Musculoskeletal: No edema, cyanosis, or clubbing. Neuro: Alert, answering all questions appropriately. Cranial nerves grossly intact. Skin: No rashes or petechiae noted. Psych: Normal affect.   LAB RESULTS:  Lab Results  Component Value Date   NA 133 (L) 12/12/2017   K 4.0 12/12/2017   CL 100 (L) 12/12/2017   CO2 24 12/12/2017    GLUCOSE 165 (H) 12/12/2017   BUN 8 12/12/2017   CREATININE 0.58 (L) 12/12/2017   CALCIUM 9.0 12/12/2017   PROT 5.9 (L) 12/12/2017   ALBUMIN 3.6 12/12/2017   AST 30 12/12/2017   ALT 16 (L) 12/12/2017   ALKPHOS 123 12/12/2017   BILITOT 1.5 (H) 12/12/2017   GFRNONAA >60 12/12/2017   GFRAA >60 12/12/2017    Lab Results  Component Value Date   WBC 1.2 (LL) 12/12/2017   NEUTROABS 0.7 (L) 12/12/2017   HGB 9.0 (L) 12/12/2017   HCT 24.6 (L) 12/12/2017   MCV 115.7 (H) 12/12/2017   PLT 56 (L) 12/12/2017     STUDIES: No results found.  ASSESSMENT: Multiple myeloma.  PLAN:  1.  Multiple myeloma: Bone marrow biopsy noted to have 29% plasma cells with kappa restriction.  Patient's kappa light chains are significantly elevated, although SPEP does not reveal an M spike. He has evidence of an organ damage with his pancytopenia, but his creatinine and calcium levels are within normal limits.  Patient did not go to his metastatic survey as scheduled. The patient will benefit from chemotherapy, but given his alcoholism hesitant to use any oral regimen.  Because of patient's persistent pancytopenia, will discontinue Cytoxan altogether and proceed with Velcade only on days 1, 4, 8, and 11.  He will also receive treatment doses dexamethasone.  We discussed possible port placement, but patient declined at this time.  Proceed with cycle 3, day 1 of treatment today.  Return to clinic on day 4, 8, and 11 for treatment as scheduled and then in 3 weeks for consideration of cycle 4, day 1. 2. Pancytopenia: Likely secondary to underlying multiple myeloma, though his heavy alcoholism is possibly contributing.  Discontinue Cytoxan as above.  Proceed with dose reduce Velcade as scheduled  3.  Elevated MCV: Patient's B12 and folate are within normal limits. 4.  Hypertension: Improved.  Unclear patient's compliance, continue management and treatment per PCP. 5.  Peripheral neuropathy: Unrelated to underlying myeloma  or his chemotherapy.  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  Continue gabapentin and have recommended patient take folic acid, thiamine, and a multivitamin. 6.  Neutropenia: Proceed with treatment as above.  Approximately 30 minutes was spent in discussion of which greater than 50% was consultation.  Patient expressed understanding and was in agreement with this plan. He also understands that He can call clinic at any time with any questions, concerns, or complaints.    Lloyd Huger, MD   12/12/2017 10:36 AM

## 2017-12-12 ENCOUNTER — Other Ambulatory Visit: Payer: Self-pay

## 2017-12-12 ENCOUNTER — Inpatient Hospital Stay (HOSPITAL_BASED_OUTPATIENT_CLINIC_OR_DEPARTMENT_OTHER): Payer: Medicare Other | Admitting: Oncology

## 2017-12-12 ENCOUNTER — Inpatient Hospital Stay: Payer: Medicare Other

## 2017-12-12 ENCOUNTER — Inpatient Hospital Stay: Payer: Medicare Other | Attending: Oncology

## 2017-12-12 VITALS — BP 163/60 | HR 80 | Temp 95.6°F | Resp 18 | Wt 173.3 lb

## 2017-12-12 DIAGNOSIS — E039 Hypothyroidism, unspecified: Secondary | ICD-10-CM | POA: Insufficient documentation

## 2017-12-12 DIAGNOSIS — D709 Neutropenia, unspecified: Secondary | ICD-10-CM | POA: Diagnosis not present

## 2017-12-12 DIAGNOSIS — F1010 Alcohol abuse, uncomplicated: Secondary | ICD-10-CM

## 2017-12-12 DIAGNOSIS — F1020 Alcohol dependence, uncomplicated: Secondary | ICD-10-CM | POA: Insufficient documentation

## 2017-12-12 DIAGNOSIS — D61818 Other pancytopenia: Secondary | ICD-10-CM | POA: Diagnosis not present

## 2017-12-12 DIAGNOSIS — G620 Drug-induced polyneuropathy: Secondary | ICD-10-CM | POA: Diagnosis not present

## 2017-12-12 DIAGNOSIS — Z5112 Encounter for antineoplastic immunotherapy: Secondary | ICD-10-CM | POA: Diagnosis not present

## 2017-12-12 DIAGNOSIS — C9000 Multiple myeloma not having achieved remission: Secondary | ICD-10-CM

## 2017-12-12 DIAGNOSIS — E119 Type 2 diabetes mellitus without complications: Secondary | ICD-10-CM | POA: Diagnosis not present

## 2017-12-12 DIAGNOSIS — Z79899 Other long term (current) drug therapy: Secondary | ICD-10-CM | POA: Diagnosis not present

## 2017-12-12 DIAGNOSIS — G629 Polyneuropathy, unspecified: Secondary | ICD-10-CM | POA: Diagnosis not present

## 2017-12-12 DIAGNOSIS — I10 Essential (primary) hypertension: Secondary | ICD-10-CM | POA: Insufficient documentation

## 2017-12-12 DIAGNOSIS — Z87891 Personal history of nicotine dependence: Secondary | ICD-10-CM | POA: Insufficient documentation

## 2017-12-12 LAB — COMPREHENSIVE METABOLIC PANEL
ALBUMIN: 3.6 g/dL (ref 3.5–5.0)
ALT: 16 U/L — ABNORMAL LOW (ref 17–63)
ANION GAP: 9 (ref 5–15)
AST: 30 U/L (ref 15–41)
Alkaline Phosphatase: 123 U/L (ref 38–126)
BILIRUBIN TOTAL: 1.5 mg/dL — AB (ref 0.3–1.2)
BUN: 8 mg/dL (ref 6–20)
CO2: 24 mmol/L (ref 22–32)
Calcium: 9 mg/dL (ref 8.9–10.3)
Chloride: 100 mmol/L — ABNORMAL LOW (ref 101–111)
Creatinine, Ser: 0.58 mg/dL — ABNORMAL LOW (ref 0.61–1.24)
GFR calc non Af Amer: >60 mL/min (ref >60)
GLUCOSE: 165 mg/dL — AB (ref 65–99)
POTASSIUM: 4 mmol/L (ref 3.5–5.1)
SODIUM: 133 mmol/L — AB (ref 135–145)
TOTAL PROTEIN: 5.9 g/dL — AB (ref 6.5–8.1)

## 2017-12-12 LAB — CBC WITH DIFFERENTIAL/PLATELET
BASOS ABS: 0 10*3/uL (ref 0–0.1)
Basophils Relative: 1 %
Eosinophils Absolute: 0 10*3/uL (ref 0–0.7)
Eosinophils Relative: 1 %
HEMATOCRIT: 24.6 % — AB (ref 40.0–52.0)
HEMOGLOBIN: 9 g/dL — AB (ref 13.0–18.0)
LYMPHS PCT: 21 %
Lymphs Abs: 0.2 10*3/uL — ABNORMAL LOW (ref 1.0–3.6)
MCH: 42.3 pg — ABNORMAL HIGH (ref 26.0–34.0)
MCHC: 36.6 g/dL — ABNORMAL HIGH (ref 32.0–36.0)
MCV: 115.7 fL — AB (ref 80.0–100.0)
MONO ABS: 0.1 10*3/uL — AB (ref 0.2–1.0)
Monocytes Relative: 13 %
NEUTROS ABS: 0.7 10*3/uL — AB (ref 1.4–6.5)
NEUTROS PCT: 64 %
Platelets: 56 10*3/uL — ABNORMAL LOW (ref 150–440)
RBC: 2.13 MIL/uL — AB (ref 4.40–5.90)
RDW: 15.5 % — ABNORMAL HIGH (ref 11.5–14.5)
WBC: 1.2 10*3/uL — AB (ref 3.8–10.6)

## 2017-12-12 LAB — SAMPLE TO BLOOD BANK

## 2017-12-12 MED ORDER — BORTEZOMIB CHEMO SQ INJECTION 3.5 MG (2.5MG/ML)
1.0000 mg/m2 | Freq: Once | INTRAMUSCULAR | Status: AC
  Administered 2017-12-12: 2 mg via SUBCUTANEOUS
  Filled 2017-12-12: qty 2

## 2017-12-12 MED ORDER — PROCHLORPERAZINE MALEATE 10 MG PO TABS
10.0000 mg | ORAL_TABLET | Freq: Once | ORAL | Status: AC
  Administered 2017-12-12: 10 mg via ORAL
  Filled 2017-12-12: qty 1

## 2017-12-12 NOTE — Progress Notes (Signed)
Per Dr Grayland Ormond, ok to order 10 mg Compazine PO for Velcade today, no Cytoxan infusion today.

## 2017-12-12 NOTE — Progress Notes (Signed)
Here for follow up. Pt not taking most meds w multiple c/o re meds

## 2017-12-15 ENCOUNTER — Inpatient Hospital Stay: Payer: Medicare Other

## 2017-12-15 VITALS — BP 151/58 | HR 79 | Temp 98.2°F | Resp 18

## 2017-12-15 DIAGNOSIS — G620 Drug-induced polyneuropathy: Secondary | ICD-10-CM | POA: Diagnosis not present

## 2017-12-15 DIAGNOSIS — Z79899 Other long term (current) drug therapy: Secondary | ICD-10-CM | POA: Diagnosis not present

## 2017-12-15 DIAGNOSIS — E119 Type 2 diabetes mellitus without complications: Secondary | ICD-10-CM | POA: Diagnosis not present

## 2017-12-15 DIAGNOSIS — E039 Hypothyroidism, unspecified: Secondary | ICD-10-CM | POA: Diagnosis not present

## 2017-12-15 DIAGNOSIS — C9000 Multiple myeloma not having achieved remission: Secondary | ICD-10-CM | POA: Diagnosis not present

## 2017-12-15 DIAGNOSIS — D61818 Other pancytopenia: Secondary | ICD-10-CM | POA: Diagnosis not present

## 2017-12-15 DIAGNOSIS — Z87891 Personal history of nicotine dependence: Secondary | ICD-10-CM | POA: Diagnosis not present

## 2017-12-15 DIAGNOSIS — Z5112 Encounter for antineoplastic immunotherapy: Secondary | ICD-10-CM | POA: Diagnosis not present

## 2017-12-15 DIAGNOSIS — I10 Essential (primary) hypertension: Secondary | ICD-10-CM | POA: Diagnosis not present

## 2017-12-15 MED ORDER — BORTEZOMIB CHEMO SQ INJECTION 3.5 MG (2.5MG/ML)
1.0000 mg/m2 | Freq: Once | INTRAMUSCULAR | Status: AC
  Administered 2017-12-15: 2 mg via SUBCUTANEOUS
  Filled 2017-12-15: qty 2

## 2017-12-15 MED ORDER — PROCHLORPERAZINE MALEATE 10 MG PO TABS
10.0000 mg | ORAL_TABLET | Freq: Once | ORAL | Status: AC
  Administered 2017-12-15: 10 mg via ORAL
  Filled 2017-12-15: qty 1

## 2017-12-19 ENCOUNTER — Inpatient Hospital Stay: Payer: Medicare Other

## 2017-12-19 ENCOUNTER — Telehealth: Payer: Self-pay | Admitting: *Deleted

## 2017-12-19 VITALS — BP 131/60 | HR 70 | Temp 97.6°F | Resp 20

## 2017-12-19 DIAGNOSIS — C9000 Multiple myeloma not having achieved remission: Secondary | ICD-10-CM

## 2017-12-19 DIAGNOSIS — D61818 Other pancytopenia: Secondary | ICD-10-CM | POA: Diagnosis not present

## 2017-12-19 DIAGNOSIS — I10 Essential (primary) hypertension: Secondary | ICD-10-CM | POA: Diagnosis not present

## 2017-12-19 DIAGNOSIS — Z87891 Personal history of nicotine dependence: Secondary | ICD-10-CM | POA: Diagnosis not present

## 2017-12-19 DIAGNOSIS — Z79899 Other long term (current) drug therapy: Secondary | ICD-10-CM | POA: Diagnosis not present

## 2017-12-19 DIAGNOSIS — E119 Type 2 diabetes mellitus without complications: Secondary | ICD-10-CM | POA: Diagnosis not present

## 2017-12-19 DIAGNOSIS — E039 Hypothyroidism, unspecified: Secondary | ICD-10-CM | POA: Diagnosis not present

## 2017-12-19 DIAGNOSIS — Z5112 Encounter for antineoplastic immunotherapy: Secondary | ICD-10-CM | POA: Diagnosis not present

## 2017-12-19 DIAGNOSIS — G620 Drug-induced polyneuropathy: Secondary | ICD-10-CM | POA: Diagnosis not present

## 2017-12-19 LAB — CBC WITH DIFFERENTIAL/PLATELET
BASOS ABS: 0 10*3/uL (ref 0–0.1)
BASOS PCT: 1 %
EOS ABS: 0 10*3/uL (ref 0–0.7)
Eosinophils Relative: 1 %
HEMATOCRIT: 24.8 % — AB (ref 40.0–52.0)
HEMOGLOBIN: 9 g/dL — AB (ref 13.0–18.0)
Lymphocytes Relative: 16 %
Lymphs Abs: 0.4 10*3/uL — ABNORMAL LOW (ref 1.0–3.6)
MCH: 42.1 pg — ABNORMAL HIGH (ref 26.0–34.0)
MCHC: 36.5 g/dL — AB (ref 32.0–36.0)
MCV: 115.3 fL — ABNORMAL HIGH (ref 80.0–100.0)
MONOS PCT: 10 %
Monocytes Absolute: 0.2 10*3/uL (ref 0.2–1.0)
NEUTROS ABS: 1.7 10*3/uL (ref 1.4–6.5)
Neutrophils Relative %: 74 %
Platelets: 26 10*3/uL — CL (ref 150–400)
RBC: 2.15 MIL/uL — AB (ref 4.40–5.90)
RDW: 15.5 % — ABNORMAL HIGH (ref 11.5–14.5)
WBC: 2.3 10*3/uL — AB (ref 3.8–10.6)

## 2017-12-19 LAB — COMPREHENSIVE METABOLIC PANEL
ALK PHOS: 113 U/L (ref 38–126)
ALT: 20 U/L (ref 17–63)
ANION GAP: 9 (ref 5–15)
AST: 38 U/L (ref 15–41)
Albumin: 3.6 g/dL (ref 3.5–5.0)
BUN: 9 mg/dL (ref 6–20)
CALCIUM: 8.9 mg/dL (ref 8.9–10.3)
CO2: 24 mmol/L (ref 22–32)
CREATININE: 0.56 mg/dL — AB (ref 0.61–1.24)
Chloride: 99 mmol/L — ABNORMAL LOW (ref 101–111)
GFR calc Af Amer: >60 mL/min (ref >60)
Glucose, Bld: 127 mg/dL — ABNORMAL HIGH (ref 65–99)
Potassium: 4.1 mmol/L (ref 3.5–5.1)
SODIUM: 132 mmol/L — AB (ref 135–145)
TOTAL PROTEIN: 5.9 g/dL — AB (ref 6.5–8.1)
Total Bilirubin: 0.9 mg/dL (ref 0.3–1.2)

## 2017-12-19 MED ORDER — PROCHLORPERAZINE MALEATE 10 MG PO TABS
10.0000 mg | ORAL_TABLET | Freq: Once | ORAL | Status: AC
  Administered 2017-12-19: 10 mg via ORAL
  Filled 2017-12-19: qty 1

## 2017-12-19 MED ORDER — BORTEZOMIB CHEMO SQ INJECTION 3.5 MG (2.5MG/ML)
1.0000 mg/m2 | Freq: Once | INTRAMUSCULAR | Status: DC
  Filled 2017-12-19: qty 0.8

## 2017-12-19 NOTE — Telephone Encounter (Signed)
Son called to report that patient never got prescription for Decadron,. I confirmed with pharmacy that they have never filled this for patient. Vicky in infusion states the dose changed, please order decadron accordingly

## 2017-12-20 ENCOUNTER — Other Ambulatory Visit: Payer: Self-pay | Admitting: *Deleted

## 2017-12-20 LAB — KAPPA/LAMBDA LIGHT CHAINS
KAPPA FREE LGHT CHN: 1186.7 mg/L — AB (ref 3.3–19.4)
KAPPA, LAMDA LIGHT CHAIN RATIO: 146.51 — AB (ref 0.26–1.65)
LAMDA FREE LIGHT CHAINS: 8.1 mg/L (ref 5.7–26.3)

## 2017-12-20 LAB — PROTEIN ELECTROPHORESIS, SERUM
A/G RATIO SPE: 1.9 — AB (ref 0.7–1.7)
ALBUMIN ELP: 3.6 g/dL (ref 2.9–4.4)
ALPHA-2-GLOBULIN: 0.4 g/dL (ref 0.4–1.0)
Alpha-1-Globulin: 0.2 g/dL (ref 0.0–0.4)
BETA GLOBULIN: 0.7 g/dL (ref 0.7–1.3)
GAMMA GLOBULIN: 0.6 g/dL (ref 0.4–1.8)
Globulin, Total: 1.9 g/dL — ABNORMAL LOW (ref 2.2–3.9)
Total Protein ELP: 5.5 g/dL — ABNORMAL LOW (ref 6.0–8.5)

## 2017-12-20 LAB — IGG, IGA, IGM
IGA: 22 mg/dL — AB (ref 61–437)
IgG (Immunoglobin G), Serum: 749 mg/dL (ref 700–1600)
IgM (Immunoglobulin M), Srm: 19 mg/dL (ref 15–143)

## 2017-12-20 MED ORDER — DEXAMETHASONE 4 MG PO TABS: 20.0000 mg | ORAL_TABLET | ORAL | 1 refills | Status: DC

## 2017-12-20 NOTE — Telephone Encounter (Signed)
I was under the impression that the patient told us he was having significant side effects to the decadron which he apparently was never taking. Please call in 20mg  decadron weekly to be taken every Monday. Thanks.

## 2017-12-20 NOTE — Telephone Encounter (Signed)
Done

## 2017-12-22 ENCOUNTER — Inpatient Hospital Stay: Payer: Medicare Other

## 2018-01-01 ENCOUNTER — Other Ambulatory Visit: Payer: Self-pay | Admitting: Oncology

## 2018-01-01 NOTE — Progress Notes (Signed)
[place]  [phone] [phone]) [phone]  ID: [name] OB: [birth year]  MR#: [id number]  SFK#:[id number]  Patient Care Team: Valerie Roys, DO as PCP - General (Family Medicine)  CHIEF COMPLAINT: Multiple myeloma.  INTERVAL HISTORY: Patient returns to clinic for further evaluation and consideration of cycle 4, day 1 of Velcade only.  He continues to have persistent painful neuropathy but appears unrelated to his treatments.  He also has persistent pancytopenia.  He continues to have increased weakness and fatigue. He denies any fevers or illnesses. He denies any easy bleeding or bruising. He denies any chest pain or shortness of breath. He has no nausea, vomiting, constipation, or diarrhea. He has no urinary complaints. Patient offers no further specific complaints today.  REVIEW OF SYSTEMS:   Review of Systems  Constitutional: Positive for malaise/fatigue. Negative for fever.  Respiratory: Negative.  Negative for cough and shortness of breath.   Cardiovascular: Negative.  Negative for chest pain and leg swelling.  Gastrointestinal: Negative.  Negative for abdominal pain, blood in stool and melena.  Genitourinary: Negative.   Musculoskeletal: Negative.   Skin: Negative.  Negative for rash.  Neurological: Positive for tingling, sensory change and weakness. Negative for dizziness and focal weakness.  Endo/Heme/Allergies: Negative.  Does not bruise/bleed easily.  Psychiatric/Behavioral: Positive for substance abuse. The patient is not nervous/anxious.     As per HPI. Otherwise, a complete review of systems is negative.  PAST MEDICAL HISTORY: Past Medical History:  Diagnosis Date  . Arthritis   . Diabetes mellitus   . Dyspnea   . 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 Hypertension   . Hypothyroidism   . Neuropathy   . Peripheral vascular disease ([place])   . Venous stasis     PAST SURGICAL  HISTORY: Past Surgical History:  Procedure Laterality Date  . CATARACT EXTRACTION W/PHACO Right 10/25/2016   Procedure: CATARACT EXTRACTION PHACO AND INTRAOCULAR LENS PLACEMENT (IOC)  right eye complicated;  Surgeon: Ronnell Freshwater, MD;  Location: [facility];  Service: Ophthalmology;  Laterality: Right;  Right eye Diabetic - oral meds Vision blue  . FINGER SURGERY      FAMILY HISTORY Family History  Problem Relation Age of Onset  . Heart disease Mother   . Alzheimer's disease Sister   . Heart disease Maternal Aunt        ADVANCED DIRECTIVES:    HEALTH MAINTENANCE: Social History   Tobacco Use  . Smoking status: Former Smoker    Packs/day: 0.25    Types: Cigarettes    Last attempt to quit: 03/23/2015    Years since quitting: 2.7  . Smokeless tobacco: Current User    Types: Chew  Substance Use Topics  . Alcohol use: Yes    Alcohol/week: 25.2 oz    Types: 42 Cans of beer per week    Comment: 5-6 every day  . Drug use: No     Colonoscopy:  PAP:  Bone density:  Lipid panel:  Allergies  Allergen Reactions  . Metformin And Related Nausea And Vomiting    Current Outpatient Medications  Medication Sig Dispense Refill  . dexamethasone (DECADRON) 4 MG tablet Take 5 tablets (20 mg total) by mouth once a week. Take 20 mg on Monday of each week. 30 tablet 1  . acyclovir (ZOVIRAX) 400 MG tablet Take 1 tablet (400 mg total) by mouth 2 (two) times daily. (Patient not taking: Reported on 12/12/2017)  60 tablet 3  . folic acid (FOLVITE) 1 MG tablet Take 1 tablet (1 mg total) by mouth daily. (Patient not taking: Reported on 01/02/2018) 30 tablet 2  . JARDIANCE 10 MG TABS tablet     . levothyroxine (SYNTHROID, LEVOTHROID) 75 MCG tablet Take 1 tablet (75 mcg total) daily before breakfast by mouth. (Patient not taking: Reported on 12/12/2017) 90 tablet 3  . lisinopril (PRINIVIL,ZESTRIL) 5 MG tablet Take 1 tablet (5 mg total) by mouth daily. (Patient not taking: Reported  on 12/12/2017) 90 tablet 1  . ondansetron (ZOFRAN) 8 MG tablet     . prochlorperazine (COMPAZINE) 10 MG tablet Take 1 tablet (10 mg total) by mouth every 6 (six) hours as needed (Nausea or vomiting). (Patient not taking: Reported on 12/12/2017) 60 tablet 2  . thiamine (VITAMIN B-1) 100 MG tablet Take 1 tablet (100 mg total) by mouth daily. (Patient not taking: Reported on 12/12/2017) 30 tablet 2   Current Facility-Administered Medications  Medication Dose Route Frequency Provider Last Rate Last Dose  . cyanocobalamin ((VITAMIN B-12)) injection 1,000 mcg  1,000 mcg Intramuscular Q30 days [place] Liter P, DO   1,000 mcg at 10/21/17 1117    OBJECTIVE: Vitals:   01/02/18 1114  BP: (!) 165/63  Pulse: 80  Resp: 18  Temp: (!) 96.9 F (36.1 C)     Body mass index is 25.7 kg/m.    ECOG FS:0 - Asymptomatic  General: Well-developed, well-nourished, no acute distress. Eyes: Pink conjunctiva, anicteric sclera. Lungs: Clear to auscultation bilaterally. Heart: Regular rate and rhythm. No rubs, murmurs, or gallops. Abdomen: Soft, nontender, nondistended. No organomegaly noted, normoactive bowel sounds. Musculoskeletal: No edema, cyanosis, or clubbing. Neuro: Alert, answering all questions appropriately. Cranial nerves grossly intact. Skin: No rashes or petechiae noted. Psych: Normal affect.   LAB RESULTS:  Lab Results  Component Value Date   NA 131 (L) 01/02/2018   K 4.5 01/02/2018   CL 97 (L) 01/02/2018   CO2 24 01/02/2018   GLUCOSE 139 (H) 01/02/2018   BUN 7 01/02/2018   CREATININE 0.58 (L) 01/02/2018   CALCIUM 8.9 01/02/2018   PROT 6.1 (L) 01/02/2018   ALBUMIN 3.9 01/02/2018   AST 37 01/02/2018   ALT 18 01/02/2018   ALKPHOS 105 01/02/2018   BILITOT 1.6 (H) 01/02/2018   GFRNONAA >60 01/02/2018   GFRAA >60 01/02/2018    Lab Results  Component Value Date   WBC 2.0 (L) 01/02/2018   NEUTROABS 1.9 01/02/2018   HGB 9.8 (L) 01/02/2018   HCT 27.1 (L) 01/02/2018   MCV 115.5 (H)  01/02/2018   PLT 58 (L) 01/02/2018     STUDIES: No results found.  ASSESSMENT: Multiple myeloma.  PLAN:  1.  Multiple myeloma: Bone marrow biopsy noted to have 29% plasma cells with kappa restriction.  Patient's kappa light chains are significantly elevated, but are improving.  SPEP does not reveal an M spike. He has evidence of an organ damage with his pancytopenia, but his creatinine and calcium levels are within normal limits.  Patient did not go to his metastatic survey as scheduled. The patient will benefit from chemotherapy, but given his alcoholism hesitant to use any oral regimen.  Because of patient's persistent pancytopenia, will discontinue Cytoxan altogether and proceed with Velcade only on days 1, 4, 8, and 11.  He will also receive treatment doses dexamethasone.  We discussed possible port placement, but patient declined at this time.  Proceed with cycle 4, day 1 of treatment today.  Return  to clinic on day 4, 8, and 11 for treatment as scheduled and then in 3 weeks for consideration of cycle 5, day 1. 2. Pancytopenia: Likely secondary to underlying multiple myeloma, though his heavy alcoholism is possibly contributing.  Discontinue Cytoxan as above.  Proceed with dose reduce Velcade as scheduled  3.  Elevated MCV: Patient's B12 and folate are within normal limits. 4.  Hypertension: Improved.  Unclear patient's compliance, continue management and treatment per PCP. 5.  Peripheral neuropathy: Present prior to beginning treatment and unlikely related to underlying myeloma or his chemotherapy.  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  Continue gabapentin and have recommended patient take folic acid, thiamine, and a multivitamin. 6.  Neutropenia: Proceed with treatment as above.  Approximately 30 minutes was spent in discussion of which greater than 50% was consultation.  Patient expressed understanding and was in agreement with this plan. He also understands that He  can call clinic at any time with any questions, concerns, or complaints.    Lloyd Huger, MD   01/04/2018 10:25 AM

## 2018-01-02 ENCOUNTER — Inpatient Hospital Stay: Payer: Medicare Other

## 2018-01-02 ENCOUNTER — Inpatient Hospital Stay (HOSPITAL_BASED_OUTPATIENT_CLINIC_OR_DEPARTMENT_OTHER): Payer: Medicare Other | Admitting: Oncology

## 2018-01-02 VITALS — BP 165/63 | HR 80 | Temp 96.9°F | Resp 18 | Wt 174.0 lb

## 2018-01-02 DIAGNOSIS — F1010 Alcohol abuse, uncomplicated: Secondary | ICD-10-CM | POA: Diagnosis not present

## 2018-01-02 DIAGNOSIS — Z79899 Other long term (current) drug therapy: Secondary | ICD-10-CM | POA: Diagnosis not present

## 2018-01-02 DIAGNOSIS — G629 Polyneuropathy, unspecified: Secondary | ICD-10-CM

## 2018-01-02 DIAGNOSIS — Z5112 Encounter for antineoplastic immunotherapy: Secondary | ICD-10-CM | POA: Diagnosis not present

## 2018-01-02 DIAGNOSIS — E119 Type 2 diabetes mellitus without complications: Secondary | ICD-10-CM | POA: Diagnosis not present

## 2018-01-02 DIAGNOSIS — C9000 Multiple myeloma not having achieved remission: Secondary | ICD-10-CM

## 2018-01-02 DIAGNOSIS — I10 Essential (primary) hypertension: Secondary | ICD-10-CM | POA: Diagnosis not present

## 2018-01-02 DIAGNOSIS — E039 Hypothyroidism, unspecified: Secondary | ICD-10-CM | POA: Diagnosis not present

## 2018-01-02 DIAGNOSIS — D709 Neutropenia, unspecified: Secondary | ICD-10-CM

## 2018-01-02 DIAGNOSIS — D61818 Other pancytopenia: Secondary | ICD-10-CM

## 2018-01-02 DIAGNOSIS — R531 Weakness: Secondary | ICD-10-CM | POA: Diagnosis not present

## 2018-01-02 DIAGNOSIS — R5383 Other fatigue: Secondary | ICD-10-CM | POA: Diagnosis not present

## 2018-01-02 DIAGNOSIS — R718 Other abnormality of red blood cells: Secondary | ICD-10-CM

## 2018-01-02 DIAGNOSIS — Z87891 Personal history of nicotine dependence: Secondary | ICD-10-CM | POA: Diagnosis not present

## 2018-01-02 DIAGNOSIS — G620 Drug-induced polyneuropathy: Secondary | ICD-10-CM | POA: Diagnosis not present

## 2018-01-02 LAB — COMPREHENSIVE METABOLIC PANEL
ALBUMIN: 3.9 g/dL (ref 3.5–5.0)
ALT: 18 U/L (ref 17–63)
ANION GAP: 10 (ref 5–15)
AST: 37 U/L (ref 15–41)
Alkaline Phosphatase: 105 U/L (ref 38–126)
BUN: 7 mg/dL (ref 6–20)
CO2: 24 mmol/L (ref 22–32)
Calcium: 8.9 mg/dL (ref 8.9–10.3)
Chloride: 97 mmol/L — ABNORMAL LOW (ref 101–111)
Creatinine, Ser: 0.58 mg/dL — ABNORMAL LOW (ref 0.61–1.24)
GFR calc Af Amer: >60 mL/min (ref >60)
GFR calc non Af Amer: >60 mL/min (ref >60)
GLUCOSE: 139 mg/dL — AB (ref 65–99)
POTASSIUM: 4.5 mmol/L (ref 3.5–5.1)
Sodium: 131 mmol/L — ABNORMAL LOW (ref 135–145)
TOTAL PROTEIN: 6.1 g/dL — AB (ref 6.5–8.1)
Total Bilirubin: 1.6 mg/dL — ABNORMAL HIGH (ref 0.3–1.2)

## 2018-01-02 LAB — CBC WITH DIFFERENTIAL/PLATELET
BASOS ABS: 0 10*3/uL (ref 0–0.1)
Basophils Relative: 0 %
Eosinophils Absolute: 0 10*3/uL (ref 0–0.7)
Eosinophils Relative: 0 %
HEMATOCRIT: 27.1 % — AB (ref 40.0–52.0)
Hemoglobin: 9.8 g/dL — ABNORMAL LOW (ref 13.0–18.0)
LYMPHS PCT: 5 %
Lymphs Abs: 0.1 10*3/uL — ABNORMAL LOW (ref 1.0–3.6)
MCH: 41.7 pg — ABNORMAL HIGH (ref 26.0–34.0)
MCHC: 36.1 g/dL — ABNORMAL HIGH (ref 32.0–36.0)
MCV: 115.5 fL — AB (ref 80.0–100.0)
MONO ABS: 0 10*3/uL — AB (ref 0.2–1.0)
MONOS PCT: 2 %
NEUTROS ABS: 1.9 10*3/uL (ref 1.4–6.5)
NEUTROS PCT: 93 %
Platelets: 58 10*3/uL — ABNORMAL LOW (ref 150–440)
RBC: 2.34 MIL/uL — ABNORMAL LOW (ref 4.40–5.90)
RDW: 15.3 % — AB (ref 11.5–14.5)
WBC: 2 10*3/uL — ABNORMAL LOW (ref 3.8–10.6)

## 2018-01-02 LAB — SAMPLE TO BLOOD BANK

## 2018-01-02 MED ORDER — SODIUM CHLORIDE 0.9 % IV SOLN
20.0000 mg | Freq: Once | INTRAVENOUS | Status: AC
  Administered 2018-01-02: 20 mg via INTRAVENOUS
  Filled 2018-01-02: qty 2

## 2018-01-02 MED ORDER — PROCHLORPERAZINE MALEATE 10 MG PO TABS
10.0000 mg | ORAL_TABLET | Freq: Once | ORAL | Status: AC
Start: 2018-01-02 — End: 2018-01-02
  Administered 2018-01-02: 10 mg via ORAL
  Filled 2018-01-02: qty 1

## 2018-01-02 MED ORDER — SODIUM CHLORIDE 0.9 % IV SOLN
INTRAVENOUS | Status: DC
  Administered 2018-01-02 (×2): via INTRAVENOUS
  Filled 2018-01-02: qty 1000

## 2018-01-02 MED ORDER — BORTEZOMIB CHEMO SQ INJECTION 3.5 MG (2.5MG/ML)
1.0000 mg/m2 | Freq: Once | INTRAMUSCULAR | Status: AC
  Administered 2018-01-02: 2 mg via SUBCUTANEOUS
  Filled 2018-01-02: qty 2

## 2018-01-02 NOTE — Progress Notes (Signed)
Pt in for follow up, reports took decadron this morning for first time.  States not taking any other medications.  Pt also states having numbness, tingling and little feeling in hands and fingers

## 2018-01-03 LAB — PROTEIN ELECTROPHORESIS, SERUM
A/G Ratio: 1.8 — ABNORMAL HIGH (ref 0.7–1.7)
ALBUMIN ELP: 3.6 g/dL (ref 2.9–4.4)
ALPHA-1-GLOBULIN: 0.2 g/dL (ref 0.0–0.4)
Alpha-2-Globulin: 0.4 g/dL (ref 0.4–1.0)
BETA GLOBULIN: 0.7 g/dL (ref 0.7–1.3)
GAMMA GLOBULIN: 0.7 g/dL (ref 0.4–1.8)
Globulin, Total: 2 g/dL — ABNORMAL LOW (ref 2.2–3.9)
TOTAL PROTEIN ELP: 5.6 g/dL — AB (ref 6.0–8.5)

## 2018-01-03 LAB — KAPPA/LAMBDA LIGHT CHAINS
KAPPA, LAMDA LIGHT CHAIN RATIO: 156.9 — AB (ref 0.26–1.65)
Kappa free light chain: 1129.7 mg/L — ABNORMAL HIGH (ref 3.3–19.4)
LAMDA FREE LIGHT CHAINS: 7.2 mg/L (ref 5.7–26.3)

## 2018-01-03 LAB — IGG, IGA, IGM
IgA: 21 mg/dL — ABNORMAL LOW (ref 61–437)
IgG (Immunoglobin G), Serum: 782 mg/dL (ref 700–1600)
IgM (Immunoglobulin M), Srm: 19 mg/dL (ref 15–143)

## 2018-01-05 ENCOUNTER — Inpatient Hospital Stay: Payer: Medicare Other

## 2018-01-05 DIAGNOSIS — I10 Essential (primary) hypertension: Secondary | ICD-10-CM | POA: Diagnosis not present

## 2018-01-05 DIAGNOSIS — G620 Drug-induced polyneuropathy: Secondary | ICD-10-CM | POA: Diagnosis not present

## 2018-01-05 DIAGNOSIS — C9000 Multiple myeloma not having achieved remission: Secondary | ICD-10-CM | POA: Diagnosis not present

## 2018-01-05 DIAGNOSIS — Z5112 Encounter for antineoplastic immunotherapy: Secondary | ICD-10-CM | POA: Diagnosis not present

## 2018-01-05 DIAGNOSIS — D61818 Other pancytopenia: Secondary | ICD-10-CM | POA: Diagnosis not present

## 2018-01-05 DIAGNOSIS — Z87891 Personal history of nicotine dependence: Secondary | ICD-10-CM | POA: Diagnosis not present

## 2018-01-05 DIAGNOSIS — Z79899 Other long term (current) drug therapy: Secondary | ICD-10-CM | POA: Diagnosis not present

## 2018-01-05 DIAGNOSIS — E039 Hypothyroidism, unspecified: Secondary | ICD-10-CM | POA: Diagnosis not present

## 2018-01-05 DIAGNOSIS — E119 Type 2 diabetes mellitus without complications: Secondary | ICD-10-CM | POA: Diagnosis not present

## 2018-01-05 MED ORDER — PROCHLORPERAZINE MALEATE 10 MG PO TABS
10.0000 mg | ORAL_TABLET | Freq: Once | ORAL | Status: AC
  Administered 2018-01-05: 10 mg via ORAL
  Filled 2018-01-05: qty 1

## 2018-01-05 MED ORDER — BORTEZOMIB CHEMO SQ INJECTION 3.5 MG (2.5MG/ML)
1.0000 mg/m2 | Freq: Once | INTRAMUSCULAR | Status: AC
  Administered 2018-01-05: 2 mg via SUBCUTANEOUS
  Filled 2018-01-05: qty 2

## 2018-01-09 ENCOUNTER — Inpatient Hospital Stay: Payer: Medicare Other

## 2018-01-09 ENCOUNTER — Inpatient Hospital Stay: Payer: Medicare Other | Attending: Oncology

## 2018-01-09 DIAGNOSIS — C9000 Multiple myeloma not having achieved remission: Secondary | ICD-10-CM | POA: Diagnosis not present

## 2018-01-09 DIAGNOSIS — Z5112 Encounter for antineoplastic immunotherapy: Secondary | ICD-10-CM | POA: Diagnosis not present

## 2018-01-09 LAB — COMPREHENSIVE METABOLIC PANEL WITH GFR
ALT: 21 U/L (ref 17–63)
AST: 37 U/L (ref 15–41)
Albumin: 3.9 g/dL (ref 3.5–5.0)
Alkaline Phosphatase: 97 U/L (ref 38–126)
Anion gap: 7 (ref 5–15)
BUN: 13 mg/dL (ref 6–20)
CO2: 24 mmol/L (ref 22–32)
Calcium: 8.8 mg/dL — ABNORMAL LOW (ref 8.9–10.3)
Chloride: 103 mmol/L (ref 101–111)
Creatinine, Ser: 0.81 mg/dL (ref 0.61–1.24)
GFR calc Af Amer: >60 mL/min
GFR calc non Af Amer: >60 mL/min
Glucose, Bld: 103 mg/dL — ABNORMAL HIGH (ref 65–99)
Potassium: 3.8 mmol/L (ref 3.5–5.1)
Sodium: 134 mmol/L — ABNORMAL LOW (ref 135–145)
Total Bilirubin: 1.1 mg/dL (ref 0.3–1.2)
Total Protein: 6.2 g/dL — ABNORMAL LOW (ref 6.5–8.1)

## 2018-01-09 LAB — CBC WITH DIFFERENTIAL/PLATELET
BASOS ABS: 0 10*3/uL (ref 0–0.1)
Basophils Relative: 0 %
Eosinophils Absolute: 0 10*3/uL (ref 0–0.7)
Eosinophils Relative: 1 %
HEMATOCRIT: 26.6 % — AB (ref 40.0–52.0)
Hemoglobin: 9.8 g/dL — ABNORMAL LOW (ref 13.0–18.0)
LYMPHS PCT: 10 %
Lymphs Abs: 0.3 10*3/uL — ABNORMAL LOW (ref 1.0–3.6)
MCH: 42.8 pg — ABNORMAL HIGH (ref 26.0–34.0)
MCHC: 36.8 g/dL — AB (ref 32.0–36.0)
MCV: 116.3 fL — AB (ref 80.0–100.0)
MONOS PCT: 6 %
Monocytes Absolute: 0.2 10*3/uL (ref 0.2–1.0)
NEUTROS ABS: 2.4 10*3/uL (ref 1.4–6.5)
Neutrophils Relative %: 83 %
Platelets: 34 10*3/uL — ABNORMAL LOW (ref 150–440)
RBC: 2.29 MIL/uL — ABNORMAL LOW (ref 4.40–5.90)
RDW: 14.9 % — AB (ref 11.5–14.5)
WBC: 2.9 10*3/uL — ABNORMAL LOW (ref 3.8–10.6)

## 2018-01-09 LAB — SAMPLE TO BLOOD BANK

## 2018-01-12 ENCOUNTER — Inpatient Hospital Stay: Payer: Medicare Other

## 2018-01-13 ENCOUNTER — Ambulatory Visit (INDEPENDENT_AMBULATORY_CARE_PROVIDER_SITE_OTHER): Payer: Medicare Other | Admitting: Family Medicine

## 2018-01-13 ENCOUNTER — Encounter: Payer: Self-pay | Admitting: Family Medicine

## 2018-01-13 VITALS — BP 142/59 | HR 90 | Temp 99.0°F | Wt 180.1 lb

## 2018-01-13 DIAGNOSIS — E1122 Type 2 diabetes mellitus with diabetic chronic kidney disease: Secondary | ICD-10-CM | POA: Diagnosis not present

## 2018-01-13 DIAGNOSIS — E538 Deficiency of other specified B group vitamins: Secondary | ICD-10-CM | POA: Diagnosis not present

## 2018-01-13 DIAGNOSIS — I129 Hypertensive chronic kidney disease with stage 1 through stage 4 chronic kidney disease, or unspecified chronic kidney disease: Secondary | ICD-10-CM

## 2018-01-13 DIAGNOSIS — E039 Hypothyroidism, unspecified: Secondary | ICD-10-CM

## 2018-01-13 DIAGNOSIS — E1140 Type 2 diabetes mellitus with diabetic neuropathy, unspecified: Secondary | ICD-10-CM | POA: Diagnosis not present

## 2018-01-13 DIAGNOSIS — E1165 Type 2 diabetes mellitus with hyperglycemia: Secondary | ICD-10-CM

## 2018-01-13 DIAGNOSIS — C9000 Multiple myeloma not having achieved remission: Secondary | ICD-10-CM | POA: Diagnosis not present

## 2018-01-13 DIAGNOSIS — N181 Chronic kidney disease, stage 1: Secondary | ICD-10-CM | POA: Diagnosis not present

## 2018-01-13 DIAGNOSIS — R531 Weakness: Secondary | ICD-10-CM

## 2018-01-13 DIAGNOSIS — IMO0002 Reserved for concepts with insufficient information to code with codable children: Secondary | ICD-10-CM

## 2018-01-13 MED ORDER — CYANOCOBALAMIN 1000 MCG/ML IJ SOLN
1000.0000 ug | Freq: Once | INTRAMUSCULAR | Status: AC
  Administered 2018-01-13: 1000 ug via INTRAMUSCULAR

## 2018-01-13 NOTE — Patient Instructions (Signed)
STOP JANUVIA AND LISINOPRIL  RESTART LEVOTHYROXINE 3mcg  HAVE BLOOD DONE AT THE CANCER CENTER- RX GIVEN

## 2018-01-13 NOTE — Progress Notes (Signed)
BP (!) 142/59 (BP Location: Left Arm, Patient Position: Sitting, Cuff Size: Normal)   Pulse 90   Temp 99 F (37.2 C)   Wt 180 lb 2 oz (81.7 kg)   SpO2 95%   BMI 26.60 kg/m    Subjective:    Patient ID: [name], male    DOB: [birth year], 77 y.o.   MRN: [id number]  HPI: [name] is a 77 y.o. male  Chief Complaint  Patient presents with  . Diabetes  . Hypothyroidism  . Hypertension   HYPERTENSION Hypertension status: stable  Satisfied with current treatment? yes Duration of hypertension: chronic BP monitoring frequency:  Weekly at cancer center BP medication side effects:  yes- has been feeling incredibly dizzy and not feeling well when taking his medicine Medication compliance: poor compliance Previous BP meds: lisinopril Aspirin: no Recurrent headaches: no Visual changes: no Palpitations: yes Dyspnea: no Chest pain: no Lower extremity edema: no Dizzy/lightheaded: yes  DIABETES Hypoglycemic episodes:yes Polydipsia/polyuria: no Visual disturbance: no Chest pain: no Paresthesias: yes Glucose Monitoring: no Taking Insulin?: no Blood Pressure Monitoring: not checking Retinal Examination: Not up to Date Foot Exam: Up to Date Diabetic Education: Completed Pneumovax: Up to Date Influenza: Up to Date Aspirin: no  HYPOTHYROIDISM Thyroid control status:uncontrolled Satisfied with current treatment? no Medication side effects: no Medication compliance: poor compliance Etiology of hypothyroidism:  Recent dose adjustment:no Fatigue: yes Cold intolerance: yes Heat intolerance: no Weight gain: no Weight loss: no Constipation: no Diarrhea/loose stools: no Palpitations: yes Lower extremity edema: yes Anxiety/depressed mood: yes  Both he and his son are in the office today, they both note that he is having a lot of trouble at home. Having issues getting his medications together and keeping the swelling out of his legs. Has been having a lot of issues  with getting around at home to. He does think that PT and home health would help him a lot right now. No other concerns or complaints at this time.   Relevant past medical, surgical, family and social history reviewed and updated as indicated. Interim medical history since our last visit reviewed. Allergies and medications reviewed and updated.  Review of Systems  Constitutional: Negative.   HENT: Negative.   Respiratory: Negative.   Cardiovascular: Positive for leg swelling. Negative for chest pain and palpitations.  Musculoskeletal: Negative.   Psychiatric/Behavioral: Negative.     Per HPI unless specifically indicated above     Objective:    BP (!) 142/59 (BP Location: Left Arm, Patient Position: Sitting, Cuff Size: Normal)   Pulse 90   Temp 99 F (37.2 C)   Wt 180 lb 2 oz (81.7 kg)   SpO2 95%   BMI 26.60 kg/m   Wt Readings from Last 3 Encounters:  01/13/18 180 lb 2 oz (81.7 kg)  01/02/18 174 lb (78.9 kg)  12/12/17 173 lb 4.8 oz (78.6 kg)    Physical Exam  Constitutional: He is oriented to person, place, and time. He appears well-developed and well-nourished. He appears ill. No distress.  HENT:  Head: Normocephalic and atraumatic.  Right Ear: Hearing normal.  Left Ear: Hearing normal.  Nose: Nose normal.  Eyes: Conjunctivae and lids are normal. Right eye exhibits no discharge. Left eye exhibits no discharge. No scleral icterus.  Cardiovascular: Normal rate, regular rhythm, normal heart sounds and intact distal pulses. Exam reveals no gallop and no friction rub.  No murmur heard. Pulmonary/Chest: Effort normal and breath sounds normal. No respiratory distress. He has no  wheezes. He has no rales. He exhibits no tenderness.  Musculoskeletal: Normal range of motion. He exhibits edema (2+ edema bilaterally, no sign of skin break down or infection).  Neurological: He is alert and oriented to person, place, and time.  Skin: Skin is intact. No rash noted. He is not  diaphoretic.  Psychiatric: He has a normal mood and affect. His speech is normal and behavior is normal. Judgment and thought content normal. Cognition and memory are normal.  Nursing note and vitals reviewed.   Results for orders placed or performed in visit on 01/09/18  Comprehensive metabolic panel  Result Value Ref Range   Sodium 134 (L) 135 - 145 mmol/L   Potassium 3.8 3.5 - 5.1 mmol/L   Chloride 103 101 - 111 mmol/L   CO2 24 22 - 32 mmol/L   Glucose, Bld 103 (H) 65 - 99 mg/dL   BUN 13 6 - 20 mg/dL   Creatinine, Ser 0.81 0.61 - 1.24 mg/dL   Calcium 8.8 (L) 8.9 - 10.3 mg/dL   Total Protein 6.2 (L) 6.5 - 8.1 g/dL   Albumin 3.9 3.5 - 5.0 g/dL   AST 37 15 - 41 U/L   ALT 21 17 - 63 U/L   Alkaline Phosphatase 97 38 - 126 U/L   Total Bilirubin 1.1 0.3 - 1.2 mg/dL   GFR calc non Af Amer >60 >60 mL/min   GFR calc Af Amer >60 >60 mL/min   Anion gap 7 5 - 15  CBC with Differential  Result Value Ref Range   WBC 2.9 (L) 3.8 - 10.6 K/uL   RBC 2.29 (L) 4.40 - 5.90 MIL/uL   Hemoglobin 9.8 (L) 13.0 - 18.0 g/dL   HCT 26.6 (L) 40.0 - 52.0 %   MCV 116.3 (H) 80.0 - 100.0 fL   MCH 42.8 (H) 26.0 - 34.0 pg   MCHC 36.8 (H) 32.0 - 36.0 g/dL   RDW 14.9 (H) 11.5 - 14.5 %   Platelets 34 (L) 150 - 440 K/uL   Neutrophils Relative % 83 %   Neutro Abs 2.4 1.4 - 6.5 K/uL   Lymphocytes Relative 10 %   Lymphs Abs 0.3 (L) 1.0 - 3.6 K/uL   Monocytes Relative 6 %   Monocytes Absolute 0.2 0.2 - 1.0 K/uL   Eosinophils Relative 1 %   Eosinophils Absolute 0.0 0 - 0.7 K/uL   Basophils Relative 0 %   Basophils Absolute 0.0 0 - 0.1 K/uL  Hold Tube- Blood Bank  Result Value Ref Range   Blood Bank Specimen SAMPLE AVAILABLE FOR TESTING    Sample Expiration      01/12/2018 Performed at [facility], [address] [place], [place] [address]       Assessment & Plan:   Problem List Items Addressed This Visit      Endocrine   Type 2 diabetes, uncontrolled, 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 Likely causing hypotension on chemo days. Will stop jardance and monitor while he is on chemo. Call with any concerns and continue to monitor closely. Will get labs drawn at the cancer center- Rx given to patient today. Orders placed.       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       Relevant Orders   Ambulatory referral to [facility] metabolic panel   Hypothyroidism    Discussed today that he can stop his other medications, but needs to take his hypothyroid medicine. Will restart it and recheck in 6 weeks. Call with any concerns. Will get his labs done at cancer center.       Relevant Orders   Ambulatory referral to Home Health   Thyroid Panel With TSH   Comprehensive metabolic panel     Genitourinary   Benign hypertensive renal disease    He is not feeling well on his lisinopril. Likely causing hypotension on chemo days. Will stop lisinopril and monitor while he is on chemo. Call with any concerns and continue to monitor closely. Will get labs drawn at the cancer center- Rx given to patient today. Orders placed.       Relevant Orders   Ambulatory referral to [place] metabolic panel     Other   Multiple myeloma ([place])    Currently undergoing chemotherapy treatment. Continue to follow with oncology. Call with any concerns. Continue to monitor.       Relevant Orders   Ambulatory referral to [facility]   Comprehensive metabolic panel   A63 deficiency    B12 shot given today.      Relevant Medications   cyanocobalamin ((VITAMIN B-12)) injection 1,000 mcg (Completed)   Other Relevant Orders   Ambulatory referral to [facility]   Comprehensive metabolic panel    Other Visit Diagnoses    Weakness       Would  benefit from PT and home health. Referral generated today.    Relevant Orders   Ambulatory referral to Home Health       Follow up plan: Return 6- 8 weeks, for recheck with labs before at cancer center.

## 2018-01-14 ENCOUNTER — Encounter: Payer: Self-pay | Admitting: Family Medicine

## 2018-01-14 NOTE — Assessment & Plan Note (Signed)
He is not feeling well on his jardiance. Likely causing hypotension on chemo days. Will stop jardance and monitor while he is on chemo. Call with any concerns and continue to monitor closely. Will get labs drawn at the cancer center- Rx given to patient today. Orders placed.

## 2018-01-14 NOTE — Assessment & Plan Note (Signed)
Currently undergoing chemotherapy treatment. Continue to follow with oncology. Call with any concerns. Continue to monitor.

## 2018-01-14 NOTE — Assessment & Plan Note (Signed)
Will repeat labs, will get done at cancer center.

## 2018-01-14 NOTE — Assessment & Plan Note (Signed)
B12 shot given today  

## 2018-01-14 NOTE — Assessment & Plan Note (Signed)
He is not feeling well on his lisinopril. Likely causing hypotension on chemo days. Will stop lisinopril and monitor while he is on chemo. Call with any concerns and continue to monitor closely. Will get labs drawn at the cancer center- Rx given to patient today. Orders placed.

## 2018-01-14 NOTE — Assessment & Plan Note (Signed)
Discussed today that he can stop his other medications, but needs to take his hypothyroid medicine. Will restart it and recheck in 6 weeks. Call with any concerns. Will get his labs done at cancer center.

## 2018-01-19 DIAGNOSIS — M6281 Muscle weakness (generalized): Secondary | ICD-10-CM | POA: Diagnosis not present

## 2018-01-19 DIAGNOSIS — E039 Hypothyroidism, unspecified: Secondary | ICD-10-CM | POA: Diagnosis not present

## 2018-01-19 DIAGNOSIS — E1140 Type 2 diabetes mellitus with diabetic neuropathy, unspecified: Secondary | ICD-10-CM | POA: Diagnosis not present

## 2018-01-19 DIAGNOSIS — E1165 Type 2 diabetes mellitus with hyperglycemia: Secondary | ICD-10-CM | POA: Diagnosis not present

## 2018-01-19 DIAGNOSIS — E1122 Type 2 diabetes mellitus with diabetic chronic kidney disease: Secondary | ICD-10-CM | POA: Diagnosis not present

## 2018-01-19 DIAGNOSIS — N181 Chronic kidney disease, stage 1: Secondary | ICD-10-CM | POA: Diagnosis not present

## 2018-01-19 DIAGNOSIS — E538 Deficiency of other specified B group vitamins: Secondary | ICD-10-CM | POA: Diagnosis not present

## 2018-01-19 DIAGNOSIS — C9000 Multiple myeloma not having achieved remission: Secondary | ICD-10-CM | POA: Diagnosis not present

## 2018-01-19 DIAGNOSIS — I129 Hypertensive chronic kidney disease with stage 1 through stage 4 chronic kidney disease, or unspecified chronic kidney disease: Secondary | ICD-10-CM | POA: Diagnosis not present

## 2018-01-20 DIAGNOSIS — N181 Chronic kidney disease, stage 1: Secondary | ICD-10-CM | POA: Diagnosis not present

## 2018-01-20 DIAGNOSIS — E1122 Type 2 diabetes mellitus with diabetic chronic kidney disease: Secondary | ICD-10-CM | POA: Diagnosis not present

## 2018-01-20 DIAGNOSIS — M6281 Muscle weakness (generalized): Secondary | ICD-10-CM | POA: Diagnosis not present

## 2018-01-20 DIAGNOSIS — E1165 Type 2 diabetes mellitus with hyperglycemia: Secondary | ICD-10-CM | POA: Diagnosis not present

## 2018-01-20 DIAGNOSIS — E538 Deficiency of other specified B group vitamins: Secondary | ICD-10-CM | POA: Diagnosis not present

## 2018-01-20 DIAGNOSIS — E1140 Type 2 diabetes mellitus with diabetic neuropathy, unspecified: Secondary | ICD-10-CM | POA: Diagnosis not present

## 2018-01-20 DIAGNOSIS — E039 Hypothyroidism, unspecified: Secondary | ICD-10-CM | POA: Diagnosis not present

## 2018-01-20 DIAGNOSIS — C9000 Multiple myeloma not having achieved remission: Secondary | ICD-10-CM | POA: Diagnosis not present

## 2018-01-20 DIAGNOSIS — I129 Hypertensive chronic kidney disease with stage 1 through stage 4 chronic kidney disease, or unspecified chronic kidney disease: Secondary | ICD-10-CM | POA: Diagnosis not present

## 2018-01-22 NOTE — Progress Notes (Signed)
[name]  [phone] [phone]  ID: [name] OB: 12/12/1940  MR#: [id number]  JFH#:[id number]  Patient Care Team: Valerie Roys, DO as PCP - General (Family Medicine)  CHIEF COMPLAINT: Multiple myeloma.  INTERVAL HISTORY: Patient returns to clinic for further evaluation and consideration of cycle 5, day 1 of Velcade only.  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  This was present prior to his treatments, but may be more pronounced.  He has weight loss, but states he just does not feel like cooking.  He continues to have chronic weakness and fatigue. He denies any fevers or illnesses. He denies any easy bleeding or bruising. He denies any chest pain or shortness of breath. He has no nausea, vomiting, constipation, or diarrhea. He has no urinary complaints. Patient offers no further specific complaints today.  REVIEW OF SYSTEMS:   Review of Systems  Constitutional: Positive for malaise/fatigue and weight loss. Negative for fever.  Respiratory: Negative.  Negative for cough and shortness of breath.   Cardiovascular: Negative.  Negative for chest pain and leg swelling.  Gastrointestinal: Negative.  Negative for abdominal pain, blood in stool and melena.  Genitourinary: Negative.   Musculoskeletal: Negative.   Skin: Negative.  Negative for rash.  Neurological: Positive for tingling, sensory change and weakness. Negative for dizziness and focal weakness.  Endo/Heme/Allergies: Negative.  Does not bruise/bleed easily.  Psychiatric/Behavioral: Positive for substance abuse. The patient is not nervous/anxious.     As per HPI. Otherwise, a complete review of systems is negative.  PAST MEDICAL HISTORY: Past Medical History:  Diagnosis Date  . Arthritis   . Diabetes mellitus   . Dyspnea   . 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  Hypertension   . Hypothyroidism   . Neuropathy   . Peripheral vascular disease ([place])   . Venous stasis     PAST SURGICAL HISTORY: Past Surgical History:  Procedure Laterality Date  . CATARACT EXTRACTION W/PHACO Right 10/25/2016   Procedure: CATARACT EXTRACTION PHACO AND INTRAOCULAR LENS PLACEMENT (IOC)  right eye complicated;  Surgeon: Ronnell Freshwater, MD;  Location: [facility];  Service: Ophthalmology;  Laterality: Right;  Right eye Diabetic - oral meds Vision blue  . FINGER SURGERY      FAMILY HISTORY Family History  Problem Relation Age of Onset  . Heart disease Mother   . Alzheimer's disease Sister   . Heart disease Maternal Aunt        ADVANCED DIRECTIVES:    HEALTH MAINTENANCE: Social History   Tobacco Use  . Smoking status: Former Smoker    Packs/day: 0.25    Types: Cigarettes    Last attempt to quit: 03/23/2015    Years since quitting: 2.8  . Smokeless tobacco: Current User    Types: Chew  Substance Use Topics  . Alcohol use: Yes    Alcohol/week: 25.2 oz    Types: 42 Cans of beer per week    Comment: 5-6 every day  . Drug use: No     Colonoscopy:  PAP:  Bone density:  Lipid panel:  Allergies  Allergen Reactions  . Metformin And Related Nausea And Vomiting    Current Outpatient Medications  Medication Sig Dispense Refill  . acyclovir (ZOVIRAX) 400 MG tablet Take 1 tablet (400 mg total) by mouth 2 (two) times daily. 60 tablet 3  . amitriptyline (ELAVIL) 25  MG tablet Take 25 mg by mouth at bedtime.    Marland Kitchen dexamethasone (DECADRON) 4 MG tablet Take 5 tablets (20 mg total) by mouth once a week. Take 20 mg on Monday of each week. 30 tablet 1  . folic acid (FOLVITE) 1 MG tablet Take 1 tablet (1 mg total) by mouth daily. 30 tablet 2  . levothyroxine (SYNTHROID, LEVOTHROID) 75 MCG tablet Take 1 tablet (75 mcg total) daily before breakfast by mouth. 90 tablet 3  . ondansetron (ZOFRAN) 8 MG tablet     . prochlorperazine (COMPAZINE) 10 MG  tablet Take 1 tablet (10 mg total) by mouth every 6 (six) hours as needed (Nausea or vomiting). 60 tablet 2  . thiamine (VITAMIN B-1) 100 MG tablet Take 1 tablet (100 mg total) by mouth daily. 30 tablet 2   Current Facility-Administered Medications  Medication Dose Route Frequency Provider Last Rate Last Dose  . cyanocobalamin ((VITAMIN B-12)) injection 1,000 mcg  1,000 mcg Intramuscular Q30 days Park Liter P, DO   1,000 mcg at 10/21/17 1117    OBJECTIVE: Vitals:   01/23/18 1111 01/23/18 1119  BP: (!) 164/66 139/82  Pulse: 74 75  Resp:    Temp: 98.2 F (36.8 C)      Body mass index is 24.35 kg/m.    ECOG FS:0 - Asymptomatic  General: Well-developed, well-nourished, no acute distress. Eyes: Pink conjunctiva, anicteric sclera. Lungs: Clear to auscultation bilaterally. Heart: Regular rate and rhythm. No rubs, murmurs, or gallops. Abdomen: Soft, nontender, nondistended. No organomegaly noted, normoactive bowel sounds. Musculoskeletal: No edema, cyanosis, or clubbing. Neuro: Alert, answering all questions appropriately. Cranial nerves grossly intact. Skin: No rashes or petechiae noted. Psych: Normal affect.  LAB RESULTS:  Lab Results  Component Value Date   NA 133 (L) 01/23/2018   K 4.4 01/23/2018   CL 100 (L) 01/23/2018   CO2 25 01/23/2018   GLUCOSE 174 (H) 01/23/2018   BUN 8 01/23/2018   CREATININE 0.63 01/23/2018   CALCIUM 9.0 01/23/2018   PROT 6.2 (L) 01/23/2018   ALBUMIN 3.8 01/23/2018   AST 30 01/23/2018   ALT 19 01/23/2018   ALKPHOS 109 01/23/2018   BILITOT 1.3 (H) 01/23/2018   GFRNONAA >60 01/23/2018   GFRAA >60 01/23/2018    Lab Results  Component Value Date   WBC 2.1 (L) 01/23/2018   NEUTROABS 2.0 01/23/2018   HGB 9.6 (L) 01/23/2018   HCT 26.5 (L) 01/23/2018   MCV 115.4 (H) 01/23/2018   PLT 75 (L) 01/23/2018     STUDIES: No results found.  ASSESSMENT: Multiple myeloma.  PLAN:  1.  Multiple myeloma: Bone marrow biopsy noted to have 29%  plasma cells with kappa restriction.  Patient's kappa light chains are significantly elevated, but are improving.  SPEP does not reveal an M spike. He has evidence of an organ damage with his pancytopenia, but his creatinine and calcium levels are within normal limits.  Patient did not go to his metastatic survey as scheduled. The patient will benefit from chemotherapy, but given his alcoholism hesitant to use any oral regimen.  Because of patient's persistent pancytopenia, will discontinue Cytoxan altogether and proceed with Velcade only on days 1, 4, 8, and 11.  He will also receive treatment doses dexamethasone.  We discussed possible port placement, but patient declined at this time.  Proceed with cycle 5, day 1 of treatment today.  Return to clinic on day 4, 8, and 11 for treatment as scheduled and then in 3 weeks for consideration  of cycle 5, day 1. 2. Pancytopenia: Patient's white blood cell count and hemoglobin are decreased, but stable.  His platelet count also remains decreased, but has improved to 75.  The etiology is multifactorial including underlying alcoholism, myeloma itself, and possibly Velcade.  Continue to monitor closely.   3.  Elevated MCV: Chronic and unchanged.  Patient's B12 and folate are within normal limits. 4.  Hypertension: Patient's blood pressure is mildly elevated today.  Unclear patient's compliance with his medications, continue management and treatment per PCP. 5.  Peripheral neuropathy: Present prior to beginning treatment and unlikely related to underlying myeloma or his chemotherapy.  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  Patient is no longer taking gabapentin and is unclear if he is taking folic acid, thiamine, and a multivitamin as directed. 6.  Neutropenia: Proceed with treatment as above. 7.  Weight loss: Patient was offered a prescription for Megace which he declined.  We will schedule an appointment with dietary in the near  future.  Approximately 30 minutes was spent in discussion of which greater than 50% was consultation.  Patient expressed understanding and was in agreement with this plan. He also understands that He can call clinic at any time with any questions, concerns, or complaints.    Lloyd Huger, MD   01/23/2018 11:47 AM

## 2018-01-23 ENCOUNTER — Other Ambulatory Visit: Payer: Self-pay

## 2018-01-23 ENCOUNTER — Inpatient Hospital Stay: Payer: Medicare Other

## 2018-01-23 ENCOUNTER — Inpatient Hospital Stay (HOSPITAL_BASED_OUTPATIENT_CLINIC_OR_DEPARTMENT_OTHER): Payer: Medicare Other | Admitting: Oncology

## 2018-01-23 ENCOUNTER — Telehealth: Payer: Self-pay | Admitting: Family Medicine

## 2018-01-23 ENCOUNTER — Other Ambulatory Visit: Payer: Self-pay | Admitting: Oncology

## 2018-01-23 ENCOUNTER — Encounter: Payer: Self-pay | Admitting: Oncology

## 2018-01-23 VITALS — BP 139/82 | HR 75 | Temp 98.2°F | Resp 16 | Wt 164.9 lb

## 2018-01-23 DIAGNOSIS — C9000 Multiple myeloma not having achieved remission: Secondary | ICD-10-CM

## 2018-01-23 DIAGNOSIS — I10 Essential (primary) hypertension: Secondary | ICD-10-CM | POA: Diagnosis not present

## 2018-01-23 DIAGNOSIS — R718 Other abnormality of red blood cells: Secondary | ICD-10-CM

## 2018-01-23 DIAGNOSIS — F1010 Alcohol abuse, uncomplicated: Secondary | ICD-10-CM | POA: Diagnosis not present

## 2018-01-23 DIAGNOSIS — D61818 Other pancytopenia: Secondary | ICD-10-CM

## 2018-01-23 DIAGNOSIS — R5383 Other fatigue: Secondary | ICD-10-CM

## 2018-01-23 DIAGNOSIS — D709 Neutropenia, unspecified: Secondary | ICD-10-CM

## 2018-01-23 DIAGNOSIS — R634 Abnormal weight loss: Secondary | ICD-10-CM

## 2018-01-23 DIAGNOSIS — R5381 Other malaise: Secondary | ICD-10-CM | POA: Diagnosis not present

## 2018-01-23 DIAGNOSIS — Z87891 Personal history of nicotine dependence: Secondary | ICD-10-CM

## 2018-01-23 DIAGNOSIS — G629 Polyneuropathy, unspecified: Secondary | ICD-10-CM | POA: Diagnosis not present

## 2018-01-23 DIAGNOSIS — Z5112 Encounter for antineoplastic immunotherapy: Secondary | ICD-10-CM | POA: Diagnosis not present

## 2018-01-23 LAB — CBC WITH DIFFERENTIAL/PLATELET
Basophils Absolute: 0 10*3/uL (ref 0–0.1)
Basophils Relative: 0 %
EOS ABS: 0 10*3/uL (ref 0–0.7)
Eosinophils Relative: 0 %
HEMATOCRIT: 26.5 % — AB (ref 40.0–52.0)
HEMOGLOBIN: 9.6 g/dL — AB (ref 13.0–18.0)
LYMPHS ABS: 0.1 10*3/uL — AB (ref 1.0–3.6)
LYMPHS PCT: 4 %
MCH: 41.8 pg — AB (ref 26.0–34.0)
MCHC: 36.2 g/dL — AB (ref 32.0–36.0)
MCV: 115.4 fL — AB (ref 80.0–100.0)
MONOS PCT: 2 %
Monocytes Absolute: 0 10*3/uL — ABNORMAL LOW (ref 0.2–1.0)
NEUTROS ABS: 2 10*3/uL (ref 1.4–6.5)
NEUTROS PCT: 94 %
Platelets: 75 10*3/uL — ABNORMAL LOW (ref 150–440)
RBC: 2.29 MIL/uL — ABNORMAL LOW (ref 4.40–5.90)
RDW: 14.4 % (ref 11.5–14.5)
WBC: 2.1 10*3/uL — ABNORMAL LOW (ref 3.8–10.6)

## 2018-01-23 LAB — COMPREHENSIVE METABOLIC PANEL
ALT: 19 U/L (ref 17–63)
AST: 30 U/L (ref 15–41)
Albumin: 3.8 g/dL (ref 3.5–5.0)
Alkaline Phosphatase: 109 U/L (ref 38–126)
Anion gap: 8 (ref 5–15)
BUN: 8 mg/dL (ref 6–20)
CHLORIDE: 100 mmol/L — AB (ref 101–111)
CO2: 25 mmol/L (ref 22–32)
CREATININE: 0.63 mg/dL (ref 0.61–1.24)
Calcium: 9 mg/dL (ref 8.9–10.3)
GFR calc non Af Amer: >60 mL/min (ref >60)
Glucose, Bld: 174 mg/dL — ABNORMAL HIGH (ref 65–99)
Potassium: 4.4 mmol/L (ref 3.5–5.1)
SODIUM: 133 mmol/L — AB (ref 135–145)
Total Bilirubin: 1.3 mg/dL — ABNORMAL HIGH (ref 0.3–1.2)
Total Protein: 6.2 g/dL — ABNORMAL LOW (ref 6.5–8.1)

## 2018-01-23 LAB — SAMPLE TO BLOOD BANK

## 2018-01-23 MED ORDER — BORTEZOMIB CHEMO SQ INJECTION 3.5 MG (2.5MG/ML)
1.0000 mg/m2 | Freq: Once | INTRAMUSCULAR | Status: AC
  Administered 2018-01-23: 2 mg via SUBCUTANEOUS
  Filled 2018-01-23: qty 2

## 2018-01-23 MED ORDER — PROCHLORPERAZINE MALEATE 10 MG PO TABS
10.0000 mg | ORAL_TABLET | Freq: Once | ORAL | Status: AC
  Administered 2018-01-23: 10 mg via ORAL
  Filled 2018-01-23: qty 1

## 2018-01-23 MED ORDER — DEXAMETHASONE 4 MG PO TABS
20.0000 mg | ORAL_TABLET | Freq: Every day | ORAL | Status: DC
  Filled 2018-01-23: qty 5

## 2018-01-23 NOTE — Telephone Encounter (Signed)
Copied from [place] [id number]. Topic: Quick Communication - See Telephone Encounter >> Jan 23, 2018  1:47 PM Conception Chancy, NT wrote: CRM for notification. See Telephone encounter for: 01/23/18.  [name] is a PT with [facility] home [facility] and requesting verbal orders for physical therapy 2x a week for 6 weeks and also occupational evaluation for patient and home safety evaluation.   [phone]

## 2018-01-24 DIAGNOSIS — M6281 Muscle weakness (generalized): Secondary | ICD-10-CM | POA: Diagnosis not present

## 2018-01-24 DIAGNOSIS — C9000 Multiple myeloma not having achieved remission: Secondary | ICD-10-CM | POA: Diagnosis not present

## 2018-01-24 DIAGNOSIS — I129 Hypertensive chronic kidney disease with stage 1 through stage 4 chronic kidney disease, or unspecified chronic kidney disease: Secondary | ICD-10-CM | POA: Diagnosis not present

## 2018-01-24 DIAGNOSIS — E538 Deficiency of other specified B group vitamins: Secondary | ICD-10-CM | POA: Diagnosis not present

## 2018-01-24 DIAGNOSIS — E1140 Type 2 diabetes mellitus with diabetic neuropathy, unspecified: Secondary | ICD-10-CM | POA: Diagnosis not present

## 2018-01-24 DIAGNOSIS — E1122 Type 2 diabetes mellitus with diabetic chronic kidney disease: Secondary | ICD-10-CM | POA: Diagnosis not present

## 2018-01-24 DIAGNOSIS — N181 Chronic kidney disease, stage 1: Secondary | ICD-10-CM | POA: Diagnosis not present

## 2018-01-24 DIAGNOSIS — E039 Hypothyroidism, unspecified: Secondary | ICD-10-CM | POA: Diagnosis not present

## 2018-01-24 DIAGNOSIS — E1165 Type 2 diabetes mellitus with hyperglycemia: Secondary | ICD-10-CM | POA: Diagnosis not present

## 2018-01-24 LAB — IGG, IGA, IGM
IGG (IMMUNOGLOBIN G), SERUM: 685 mg/dL — AB (ref 700–1600)
IGM (IMMUNOGLOBULIN M), SRM: 16 mg/dL (ref 15–143)
IgA: 14 mg/dL — ABNORMAL LOW (ref 61–437)

## 2018-01-24 LAB — PROTEIN ELECTROPHORESIS, SERUM
A/G Ratio: 1.6 (ref 0.7–1.7)
ALBUMIN ELP: 3.4 g/dL (ref 2.9–4.4)
ALPHA-1-GLOBULIN: 0.3 g/dL (ref 0.0–0.4)
Alpha-2-Globulin: 0.4 g/dL (ref 0.4–1.0)
BETA GLOBULIN: 0.7 g/dL (ref 0.7–1.3)
GAMMA GLOBULIN: 0.7 g/dL (ref 0.4–1.8)
Globulin, Total: 2.1 g/dL — ABNORMAL LOW (ref 2.2–3.9)
TOTAL PROTEIN ELP: 5.5 g/dL — AB (ref 6.0–8.5)

## 2018-01-24 LAB — KAPPA/LAMBDA LIGHT CHAINS
Kappa free light chain: 1228.2 mg/L — ABNORMAL HIGH (ref 3.3–19.4)
Kappa, lambda light chain ratio: 215.47 — ABNORMAL HIGH (ref 0.26–1.65)
LAMDA FREE LIGHT CHAINS: 5.7 mg/L (ref 5.7–26.3)

## 2018-01-26 ENCOUNTER — Inpatient Hospital Stay: Payer: Medicare Other

## 2018-01-26 VITALS — BP 153/62 | HR 84 | Temp 98.1°F | Resp 18

## 2018-01-26 DIAGNOSIS — Z5112 Encounter for antineoplastic immunotherapy: Secondary | ICD-10-CM | POA: Diagnosis not present

## 2018-01-26 DIAGNOSIS — C9000 Multiple myeloma not having achieved remission: Secondary | ICD-10-CM

## 2018-01-26 MED ORDER — BORTEZOMIB CHEMO SQ INJECTION 3.5 MG (2.5MG/ML)
1.0000 mg/m2 | Freq: Once | INTRAMUSCULAR | Status: AC
  Administered 2018-01-26: 2 mg via SUBCUTANEOUS
  Filled 2018-01-26: qty 2

## 2018-01-26 MED ORDER — PROCHLORPERAZINE MALEATE 10 MG PO TABS
10.0000 mg | ORAL_TABLET | Freq: Once | ORAL | Status: AC
  Administered 2018-01-26: 10 mg via ORAL

## 2018-01-26 MED ORDER — DEXAMETHASONE 4 MG PO TABS
20.0000 mg | ORAL_TABLET | Freq: Every day | ORAL | Status: DC
  Administered 2018-01-26: 20 mg via ORAL
  Filled 2018-01-26: qty 5

## 2018-01-26 NOTE — Progress Notes (Signed)
Nutrition Assessment   Reason for Assessment:   Weight loss  ASSESSMENT:  77 year old male with multiple myeloma.  Past medical history of DM, HTN, hypothyroidism, PVD, etoh use.    Met with patient during infusion this pm. Son is present during visit. Patient reports that he does not have an appetite.   Patient reports that he eats eggs for breakfast, does not eat lunch maybe has pack of peanut butter nabs and has meat sandwich for supper.  Reports that he drinks gatorade, tea mostly.  Has tried ensure from samples given and likes them but can't afford to buy them.  Reports that he can't afford to buy groceries after he pays pills.  Reports that he goes to [facility] for food.  Patient reports no nausea or issues with constipation of diarrhea.  Reports has bowel movement early am.    Nutrition Focused Physical Exam: deferred  Medications: decadron, folic acid, zofran, compazine, thiamine, vit B 12  Noted patient declined megace  Labs: reviewed  Anthropometrics:   Height: 69 inches Weight: 164 lb 14.4 oz UBW: does not know.   in Jan 2019 BMI: 24  Noted 180 lb 2 oz on 4/5 (measured at another MD office) Last weight at cancer center 174 lb 6% weight loss in the last 3 weeks, significant.   Estimated Energy Needs  Kcals: [id number] calories/d Protein: 94-112 g/d Fluid: 2.2 L/d  NUTRITION DIAGNOSIS: Inadequate oral intake related to cancer and cancer treatment and lack of income as evidenced by 6% weight loss in the last 3 weeks.   MALNUTRITION DIAGNOSIS: Patient meets criteria for severe malnutrition in acute illness but likely progressing in chronic illness as evidenced by 6% weight loss in 3 weeks and eating < or equal to 50% energy needs for > or equal to 5 days.     INTERVENTION:   Discussed importance of good nutrition during treatment.  Provided patient with food bag from food pantry. Provided patient with 1st case of complimentary ensure plus.    Discussed foods high in calories and protein.      MONITORING, EVALUATION, GOAL: weight trends and intake   NEXT VISIT: May 9 during infusion  Veleria Barnhardt B. Zenia Resides, [place], [place] Registered Dietitian [phone] (pager)

## 2018-01-27 DIAGNOSIS — E1140 Type 2 diabetes mellitus with diabetic neuropathy, unspecified: Secondary | ICD-10-CM | POA: Diagnosis not present

## 2018-01-27 DIAGNOSIS — N181 Chronic kidney disease, stage 1: Secondary | ICD-10-CM | POA: Diagnosis not present

## 2018-01-27 DIAGNOSIS — E538 Deficiency of other specified B group vitamins: Secondary | ICD-10-CM | POA: Diagnosis not present

## 2018-01-27 DIAGNOSIS — M6281 Muscle weakness (generalized): Secondary | ICD-10-CM | POA: Diagnosis not present

## 2018-01-27 DIAGNOSIS — I129 Hypertensive chronic kidney disease with stage 1 through stage 4 chronic kidney disease, or unspecified chronic kidney disease: Secondary | ICD-10-CM | POA: Diagnosis not present

## 2018-01-27 DIAGNOSIS — E1122 Type 2 diabetes mellitus with diabetic chronic kidney disease: Secondary | ICD-10-CM | POA: Diagnosis not present

## 2018-01-27 DIAGNOSIS — E039 Hypothyroidism, unspecified: Secondary | ICD-10-CM | POA: Diagnosis not present

## 2018-01-27 DIAGNOSIS — E1165 Type 2 diabetes mellitus with hyperglycemia: Secondary | ICD-10-CM | POA: Diagnosis not present

## 2018-01-27 DIAGNOSIS — C9000 Multiple myeloma not having achieved remission: Secondary | ICD-10-CM | POA: Diagnosis not present

## 2018-01-30 ENCOUNTER — Inpatient Hospital Stay: Payer: Medicare Other

## 2018-01-30 ENCOUNTER — Other Ambulatory Visit: Payer: Self-pay

## 2018-01-30 VITALS — BP 180/64 | HR 81 | Temp 96.5°F | Resp 18 | Wt 175.0 lb

## 2018-01-30 DIAGNOSIS — C9000 Multiple myeloma not having achieved remission: Secondary | ICD-10-CM | POA: Diagnosis not present

## 2018-01-30 DIAGNOSIS — Z5112 Encounter for antineoplastic immunotherapy: Secondary | ICD-10-CM | POA: Diagnosis not present

## 2018-01-30 LAB — CBC WITH DIFFERENTIAL/PLATELET
BASOS PCT: 0 %
Basophils Absolute: 0 10*3/uL (ref 0–0.1)
Eosinophils Absolute: 0 10*3/uL (ref 0–0.7)
Eosinophils Relative: 0 %
HEMATOCRIT: 28.4 % — AB (ref 40.0–52.0)
HEMOGLOBIN: 10.4 g/dL — AB (ref 13.0–18.0)
LYMPHS ABS: 0.1 10*3/uL — AB (ref 1.0–3.6)
Lymphocytes Relative: 4 %
MCH: 42 pg — AB (ref 26.0–34.0)
MCHC: 36.8 g/dL — AB (ref 32.0–36.0)
MCV: 114 fL — ABNORMAL HIGH (ref 80.0–100.0)
MONO ABS: 0.1 10*3/uL — AB (ref 0.2–1.0)
MONOS PCT: 2 %
NEUTROS ABS: 2.6 10*3/uL (ref 1.4–6.5)
NEUTROS PCT: 94 %
Platelets: 51 10*3/uL — ABNORMAL LOW (ref 150–440)
RBC: 2.49 MIL/uL — ABNORMAL LOW (ref 4.40–5.90)
RDW: 13.8 % (ref 11.5–14.5)
WBC: 2.8 10*3/uL — ABNORMAL LOW (ref 3.8–10.6)

## 2018-01-30 LAB — COMPREHENSIVE METABOLIC PANEL
ALBUMIN: 3.7 g/dL (ref 3.5–5.0)
ALK PHOS: 95 U/L (ref 38–126)
ALT: 24 U/L (ref 17–63)
ANION GAP: 10 (ref 5–15)
AST: 39 U/L (ref 15–41)
BUN: 13 mg/dL (ref 6–20)
CALCIUM: 8.5 mg/dL — AB (ref 8.9–10.3)
CHLORIDE: 91 mmol/L — AB (ref 101–111)
CO2: 23 mmol/L (ref 22–32)
CREATININE: 0.59 mg/dL — AB (ref 0.61–1.24)
GFR calc non Af Amer: >60 mL/min (ref >60)
GLUCOSE: 294 mg/dL — AB (ref 65–99)
Potassium: 4.6 mmol/L (ref 3.5–5.1)
SODIUM: 124 mmol/L — AB (ref 135–145)
Total Bilirubin: 1.5 mg/dL — ABNORMAL HIGH (ref 0.3–1.2)
Total Protein: 5.8 g/dL — ABNORMAL LOW (ref 6.5–8.1)

## 2018-01-30 LAB — SAMPLE TO BLOOD BANK

## 2018-01-30 MED ORDER — ONDANSETRON HCL 4 MG PO TABS
8.0000 mg | ORAL_TABLET | Freq: Once | ORAL | Status: AC
  Administered 2018-01-30: 8 mg via ORAL
  Filled 2018-01-30: qty 2

## 2018-01-30 MED ORDER — DEXAMETHASONE 4 MG PO TABS
20.0000 mg | ORAL_TABLET | Freq: Every day | ORAL | Status: DC
  Filled 2018-01-30: qty 5

## 2018-01-30 MED ORDER — PROCHLORPERAZINE MALEATE 10 MG PO TABS: 10.0000 mg | ORAL_TABLET | Freq: Once | ORAL | Status: DC

## 2018-01-30 MED ORDER — BORTEZOMIB CHEMO SQ INJECTION 3.5 MG (2.5MG/ML)
1.0000 mg/m2 | Freq: Once | INTRAMUSCULAR | Status: AC
  Administered 2018-01-30: 2 mg via SUBCUTANEOUS
  Filled 2018-01-30: qty 2

## 2018-01-31 ENCOUNTER — Telehealth: Payer: Self-pay | Admitting: Family Medicine

## 2018-01-31 DIAGNOSIS — E039 Hypothyroidism, unspecified: Secondary | ICD-10-CM | POA: Diagnosis not present

## 2018-01-31 DIAGNOSIS — E538 Deficiency of other specified B group vitamins: Secondary | ICD-10-CM | POA: Diagnosis not present

## 2018-01-31 DIAGNOSIS — E1140 Type 2 diabetes mellitus with diabetic neuropathy, unspecified: Secondary | ICD-10-CM | POA: Diagnosis not present

## 2018-01-31 DIAGNOSIS — E1165 Type 2 diabetes mellitus with hyperglycemia: Secondary | ICD-10-CM | POA: Diagnosis not present

## 2018-01-31 DIAGNOSIS — I129 Hypertensive chronic kidney disease with stage 1 through stage 4 chronic kidney disease, or unspecified chronic kidney disease: Secondary | ICD-10-CM | POA: Diagnosis not present

## 2018-01-31 DIAGNOSIS — C9000 Multiple myeloma not having achieved remission: Secondary | ICD-10-CM | POA: Diagnosis not present

## 2018-01-31 DIAGNOSIS — N181 Chronic kidney disease, stage 1: Secondary | ICD-10-CM | POA: Diagnosis not present

## 2018-01-31 DIAGNOSIS — M6281 Muscle weakness (generalized): Secondary | ICD-10-CM | POA: Diagnosis not present

## 2018-01-31 DIAGNOSIS — E1122 Type 2 diabetes mellitus with diabetic chronic kidney disease: Secondary | ICD-10-CM | POA: Diagnosis not present

## 2018-01-31 NOTE — Telephone Encounter (Signed)
Copied from [place] [id number]. Topic: Quick Communication - See Telephone Encounter >> Jan 31, 2018 10:14 AM Arletha Grippe wrote: CRM for notification. See Telephone encounter for: 01/31/18. Esther from [employer] called - pt requested the eval to be moved to today from last week.   Cb is [phone]

## 2018-01-31 NOTE — Telephone Encounter (Signed)
Verbal given 

## 2018-02-02 ENCOUNTER — Inpatient Hospital Stay: Payer: Medicare Other

## 2018-02-02 ENCOUNTER — Telehealth: Payer: Self-pay | Admitting: Family Medicine

## 2018-02-02 VITALS — BP 123/55 | HR 90 | Temp 98.7°F | Resp 18

## 2018-02-02 DIAGNOSIS — C9000 Multiple myeloma not having achieved remission: Secondary | ICD-10-CM

## 2018-02-02 DIAGNOSIS — Z5112 Encounter for antineoplastic immunotherapy: Secondary | ICD-10-CM | POA: Diagnosis not present

## 2018-02-02 MED ORDER — PROCHLORPERAZINE MALEATE 10 MG PO TABS: 10.0000 mg | ORAL_TABLET | Freq: Once | ORAL | Status: DC

## 2018-02-02 MED ORDER — ONDANSETRON HCL 4 MG PO TABS
8.0000 mg | ORAL_TABLET | Freq: Once | ORAL | Status: AC
  Administered 2018-02-02: 8 mg via ORAL
  Filled 2018-02-02: qty 2

## 2018-02-02 MED ORDER — BORTEZOMIB CHEMO SQ INJECTION 3.5 MG (2.5MG/ML)
1.0000 mg/m2 | Freq: Once | INTRAMUSCULAR | Status: AC
  Administered 2018-02-02: 2 mg via SUBCUTANEOUS
  Filled 2018-02-02: qty 2

## 2018-02-02 MED ORDER — PROCHLORPERAZINE EDISYLATE 10 MG/2ML IJ SOLN: 10.0000 mg | Freq: Once | INTRAMUSCULAR | Status: DC

## 2018-02-02 NOTE — Telephone Encounter (Signed)
Is this ok?

## 2018-02-02 NOTE — Telephone Encounter (Signed)
Copied from [place] [id number]. Topic: General - Other >> Feb 02, 2018  3:18 PM Margot Ables wrote: Reason for CRM: requesting VO for OT 1 week 3 including eval (eval was 02/01/18)

## 2018-02-02 NOTE — Telephone Encounter (Signed)
Totally OK

## 2018-02-03 DIAGNOSIS — E1140 Type 2 diabetes mellitus with diabetic neuropathy, unspecified: Secondary | ICD-10-CM | POA: Diagnosis not present

## 2018-02-03 DIAGNOSIS — I129 Hypertensive chronic kidney disease with stage 1 through stage 4 chronic kidney disease, or unspecified chronic kidney disease: Secondary | ICD-10-CM | POA: Diagnosis not present

## 2018-02-03 DIAGNOSIS — C9000 Multiple myeloma not having achieved remission: Secondary | ICD-10-CM | POA: Diagnosis not present

## 2018-02-03 DIAGNOSIS — M6281 Muscle weakness (generalized): Secondary | ICD-10-CM | POA: Diagnosis not present

## 2018-02-03 DIAGNOSIS — E039 Hypothyroidism, unspecified: Secondary | ICD-10-CM | POA: Diagnosis not present

## 2018-02-03 DIAGNOSIS — E538 Deficiency of other specified B group vitamins: Secondary | ICD-10-CM | POA: Diagnosis not present

## 2018-02-03 DIAGNOSIS — E1165 Type 2 diabetes mellitus with hyperglycemia: Secondary | ICD-10-CM | POA: Diagnosis not present

## 2018-02-03 DIAGNOSIS — N181 Chronic kidney disease, stage 1: Secondary | ICD-10-CM | POA: Diagnosis not present

## 2018-02-03 DIAGNOSIS — E1122 Type 2 diabetes mellitus with diabetic chronic kidney disease: Secondary | ICD-10-CM | POA: Diagnosis not present

## 2018-02-03 NOTE — Telephone Encounter (Signed)
Verbal order given  

## 2018-02-07 ENCOUNTER — Other Ambulatory Visit: Payer: Self-pay | Admitting: Nurse Practitioner

## 2018-02-07 DIAGNOSIS — E1140 Type 2 diabetes mellitus with diabetic neuropathy, unspecified: Secondary | ICD-10-CM | POA: Diagnosis not present

## 2018-02-07 DIAGNOSIS — M6281 Muscle weakness (generalized): Secondary | ICD-10-CM | POA: Diagnosis not present

## 2018-02-07 DIAGNOSIS — E039 Hypothyroidism, unspecified: Secondary | ICD-10-CM | POA: Diagnosis not present

## 2018-02-07 DIAGNOSIS — E1165 Type 2 diabetes mellitus with hyperglycemia: Secondary | ICD-10-CM | POA: Diagnosis not present

## 2018-02-07 DIAGNOSIS — E1122 Type 2 diabetes mellitus with diabetic chronic kidney disease: Secondary | ICD-10-CM | POA: Diagnosis not present

## 2018-02-07 DIAGNOSIS — E538 Deficiency of other specified B group vitamins: Secondary | ICD-10-CM | POA: Diagnosis not present

## 2018-02-07 DIAGNOSIS — I129 Hypertensive chronic kidney disease with stage 1 through stage 4 chronic kidney disease, or unspecified chronic kidney disease: Secondary | ICD-10-CM | POA: Diagnosis not present

## 2018-02-07 DIAGNOSIS — N181 Chronic kidney disease, stage 1: Secondary | ICD-10-CM | POA: Diagnosis not present

## 2018-02-07 DIAGNOSIS — C9000 Multiple myeloma not having achieved remission: Secondary | ICD-10-CM | POA: Diagnosis not present

## 2018-02-09 DIAGNOSIS — C9000 Multiple myeloma not having achieved remission: Secondary | ICD-10-CM | POA: Diagnosis not present

## 2018-02-09 DIAGNOSIS — I129 Hypertensive chronic kidney disease with stage 1 through stage 4 chronic kidney disease, or unspecified chronic kidney disease: Secondary | ICD-10-CM | POA: Diagnosis not present

## 2018-02-09 DIAGNOSIS — E039 Hypothyroidism, unspecified: Secondary | ICD-10-CM | POA: Diagnosis not present

## 2018-02-09 DIAGNOSIS — E1140 Type 2 diabetes mellitus with diabetic neuropathy, unspecified: Secondary | ICD-10-CM | POA: Diagnosis not present

## 2018-02-09 DIAGNOSIS — M6281 Muscle weakness (generalized): Secondary | ICD-10-CM | POA: Diagnosis not present

## 2018-02-09 DIAGNOSIS — E1122 Type 2 diabetes mellitus with diabetic chronic kidney disease: Secondary | ICD-10-CM | POA: Diagnosis not present

## 2018-02-09 DIAGNOSIS — N181 Chronic kidney disease, stage 1: Secondary | ICD-10-CM | POA: Diagnosis not present

## 2018-02-09 DIAGNOSIS — E538 Deficiency of other specified B group vitamins: Secondary | ICD-10-CM | POA: Diagnosis not present

## 2018-02-09 DIAGNOSIS — E1165 Type 2 diabetes mellitus with hyperglycemia: Secondary | ICD-10-CM | POA: Diagnosis not present

## 2018-02-10 NOTE — Progress Notes (Signed)
[facility]  [phone]) [phone] [phone]) [phone]  ID: [name] OB: March 13, 1941  MR#: [id number]  ZDG#:[id number]  Patient Care Team: Valerie Roys, DO as PCP - General (Family Medicine)  CHIEF COMPLAINT: Multiple myeloma.  INTERVAL HISTORY: Patient returns to clinic today for further evaluation and consideration of cycle 6, day 1 of Velcade.  Patient states his persistent neuropathy has mildly improved with gabapentin, but he ran out of medication several days ago.  He also has worsening bilateral lower extremity edema. He continues to have chronic weakness and fatigue. He denies any fevers or illnesses. He denies any easy bleeding or bruising. He denies any chest pain or shortness of breath. He has no nausea, vomiting, constipation, or diarrhea. He has no urinary complaints.  Patient offers no further specific complaints today.  REVIEW OF SYSTEMS:   Review of Systems  Constitutional: Positive for malaise/fatigue and weight loss. Negative for fever.  Respiratory: Negative.  Negative for cough and shortness of breath.   Cardiovascular: Negative.  Negative for chest pain and leg swelling.  Gastrointestinal: Negative.  Negative for abdominal pain, blood in stool and melena.  Genitourinary: Negative.   Musculoskeletal: Negative.   Skin: Negative.  Negative for rash.  Neurological: Positive for tingling, sensory change and weakness. Negative for dizziness and focal weakness.  Endo/Heme/Allergies: Negative.  Does not bruise/bleed easily.  Psychiatric/Behavioral: Positive for substance abuse. The patient is not nervous/anxious.     As per HPI. Otherwise, a complete review of systems is negative.  PAST MEDICAL HISTORY: Past Medical History:  Diagnosis Date  . Arthritis   . Diabetes mellitus   . Dyspnea   . 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 Hypertension   . Hypothyroidism   . Neuropathy   . Peripheral vascular  disease ([place])   . Venous stasis     PAST SURGICAL HISTORY: Past Surgical History:  Procedure Laterality Date  . CATARACT EXTRACTION W/PHACO Right 10/25/2016   Procedure: CATARACT EXTRACTION PHACO AND INTRAOCULAR LENS PLACEMENT (IOC)  right eye complicated;  Surgeon: Ronnell Freshwater, MD;  Location: [facility];  Service: Ophthalmology;  Laterality: Right;  Right eye Diabetic - oral meds Vision blue  . FINGER SURGERY      FAMILY HISTORY Family History  Problem Relation Age of Onset  . Heart disease Mother   . Alzheimer's disease Sister   . Heart disease Maternal Aunt        ADVANCED DIRECTIVES:    HEALTH MAINTENANCE: Social History   Tobacco Use  . Smoking status: Former Smoker    Packs/day: 0.25    Types: Cigarettes    Last attempt to quit: 03/23/2015    Years since quitting: 2.8  . Smokeless tobacco: Current User    Types: Chew  Substance Use Topics  . Alcohol use: Yes    Alcohol/week: 25.2 oz    Types: 42 Cans of beer per week    Comment: 5-6 every day  . Drug use: No     Colonoscopy:  PAP:  Bone density:  Lipid panel:  Allergies  Allergen Reactions  . Metformin And Related Nausea And Vomiting    Current Outpatient Medications  Medication Sig Dispense Refill  . acyclovir (ZOVIRAX) 400 MG tablet Take 1 tablet (400 mg total) by mouth 2 (two) times daily. 60 tablet 3  . amitriptyline (ELAVIL) 25 MG tablet Take 25 mg by mouth at bedtime.    Marland Kitchen dexamethasone (DECADRON)  4 MG tablet Take 5 tablets (20 mg total) by mouth once a week. Take 20 mg on Monday of each week. 30 tablet 1  . folic acid (FOLVITE) 1 MG tablet Take 1 tablet (1 mg total) by mouth daily. 30 tablet 2  . levothyroxine (SYNTHROID, LEVOTHROID) 75 MCG tablet Take 1 tablet (75 mcg total) daily before breakfast by mouth. 90 tablet 3  . ondansetron (ZOFRAN) 8 MG tablet     . prochlorperazine (COMPAZINE) 10 MG tablet Take 1 tablet (10 mg total) by mouth every 6 (six) hours as needed  (Nausea or vomiting). 60 tablet 2  . thiamine (VITAMIN B-1) 100 MG tablet Take 1 tablet (100 mg total) by mouth daily. 30 tablet 2  . gabapentin (NEURONTIN) 300 MG capsule Take 2 capsules (600 mg total) by mouth 3 (three) times daily. 90 capsule 1   Current Facility-Administered Medications  Medication Dose Route Frequency Provider Last Rate Last Dose  . cyanocobalamin ((VITAMIN B-12)) injection 1,000 mcg  1,000 mcg Intramuscular Q30 days Wynetta Emery, Megan P, DO   1,000 mcg at 10/21/17 1117   Facility-Administered Medications Ordered in Other Visits  Medication Dose Route Frequency Provider Last Rate Last Dose  . dexamethasone (DECADRON) tablet 20 mg  20 mg Oral Daily Lloyd Huger, MD   20 mg at 02/13/18 1050    OBJECTIVE: Vitals:   02/13/18 1004  BP: (!) 150/65  Pulse: 82  Resp: 18  Temp: (!) 96.5 F (35.8 C)     Body mass index is 27.1 kg/m.    ECOG FS:0 - Asymptomatic  General: Disheveled appearing, no acute distress. Eyes: Pink conjunctiva, anicteric sclera. HEENT: Normocephalic, moist mucous membranes, clear oropharnyx. Lungs: Clear to auscultation bilaterally. Heart: Regular rate and rhythm. No rubs, murmurs, or gallops. Abdomen: Soft, nontender, nondistended. No organomegaly noted, normoactive bowel sounds. Musculoskeletal: Bilateral lower extremity edema. Neuro: Alert, answering all questions appropriately. Cranial nerves grossly intact. Skin: No rashes or petechiae noted. Psych: Normal affect.  LAB RESULTS:  Lab Results  Component Value Date   NA 124 (L) 02/13/2018   K 4.4 02/13/2018   CL 93 (L) 02/13/2018   CO2 20 (L) 02/13/2018   GLUCOSE 148 (H) 02/13/2018   BUN 9 02/13/2018   CREATININE 0.48 (L) 02/13/2018   CALCIUM 8.9 02/13/2018   PROT 6.2 (L) 02/13/2018   ALBUMIN 4.0 02/13/2018   AST 35 02/13/2018   ALT 24 02/13/2018   ALKPHOS 109 02/13/2018   BILITOT 1.8 (H) 02/13/2018   GFRNONAA >60 02/13/2018   GFRAA >60 02/13/2018    Lab Results    Component Value Date   WBC 3.7 (L) 02/13/2018   NEUTROABS 3.6 02/13/2018   HGB 8.2 (L) 02/13/2018   HCT 22.2 (L) 02/13/2018   MCV 113.1 (H) 02/13/2018   PLT 58 (L) 02/13/2018     STUDIES: No results found.  ASSESSMENT: Multiple myeloma.  PLAN:  1.  Multiple myeloma: Bone marrow biopsy noted to have 29% plasma cells with kappa restriction.  Patient's kappa light chains are significantly elevated, but are improving.  SPEP does not reveal an M spike. He has evidence of an organ damage with his pancytopenia, but his creatinine and calcium levels are within normal limits.  Patient did not go to his metastatic survey as scheduled. The patient will benefit from chemotherapy, but given his alcoholism hesitant to use any oral regimen.  Because of patient's persistent pancytopenia, will discontinue Cytoxan altogether and proceed with Velcade only on days 1, 4, 8, and  11.  He will also receive treatment doses dexamethasone.  We discussed possible port placement, but patient declined at this time.  Proceed with cycle 6, day 1 today.  Return to clinic on day 4, 8, and 11 for treatment as scheduled.  Plan to give patient approximately 4 weeks off at the conclusion of this treatment and then repeat bone marrow biopsy to assess whether additional chemotherapy is necessary.  Return to clinic approximately 1 week after his biopsy to discuss the results. 2.  Leukopenia: Mildly improved, monitor. 3.  Anemia: Patient's hemoglobin has trended down to 8.2.  He does not require transfusion at this time, but will consider one in the future if necessary. 4.  Thrombocytopenia: Patient's platelet count remains decreased, but essentially at his baseline. 5.  Elevated MCV: Chronic and unchanged.  Patient's B12 and folate are within normal limits. 6.  Hypertension: Blood pressure remains mildly elevated today.  Unclear patient's compliance with his medications, continue management and treatment per PCP. 7.  Peripheral  neuropathy: Present prior to beginning treatment and unlikely related to underlying myeloma or his chemotherapy.  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  Patient states he is now taking gabapentin and it improves his symptoms, therefore will increase to 600 mg 3 times a day.  Patient is not taking folic acid, thiamine, and a multivitamin as directed. 8.  Weight loss: Patient was previously offered a prescription for Megace which he declined.  Consider dietary referral.  Patient expressed understanding and was in agreement with this plan. He also understands that He can call clinic at any time with any questions, concerns, or complaints.    Lloyd Huger, MD   02/13/2018 1:09 PM

## 2018-02-11 ENCOUNTER — Other Ambulatory Visit: Payer: Self-pay | Admitting: Nurse Practitioner

## 2018-02-13 ENCOUNTER — Inpatient Hospital Stay (HOSPITAL_BASED_OUTPATIENT_CLINIC_OR_DEPARTMENT_OTHER): Payer: Medicare Other | Admitting: Oncology

## 2018-02-13 ENCOUNTER — Encounter: Payer: Self-pay | Admitting: Oncology

## 2018-02-13 ENCOUNTER — Other Ambulatory Visit: Payer: Self-pay

## 2018-02-13 ENCOUNTER — Inpatient Hospital Stay: Payer: Medicare Other

## 2018-02-13 ENCOUNTER — Inpatient Hospital Stay: Payer: Medicare Other | Attending: Oncology

## 2018-02-13 VITALS — BP 150/65 | HR 82 | Temp 96.5°F | Resp 18 | Wt 183.5 lb

## 2018-02-13 DIAGNOSIS — R718 Other abnormality of red blood cells: Secondary | ICD-10-CM | POA: Diagnosis not present

## 2018-02-13 DIAGNOSIS — R600 Localized edema: Secondary | ICD-10-CM | POA: Diagnosis not present

## 2018-02-13 DIAGNOSIS — Z87891 Personal history of nicotine dependence: Secondary | ICD-10-CM

## 2018-02-13 DIAGNOSIS — G629 Polyneuropathy, unspecified: Secondary | ICD-10-CM | POA: Diagnosis not present

## 2018-02-13 DIAGNOSIS — D72819 Decreased white blood cell count, unspecified: Secondary | ICD-10-CM | POA: Diagnosis not present

## 2018-02-13 DIAGNOSIS — C9000 Multiple myeloma not having achieved remission: Secondary | ICD-10-CM | POA: Insufficient documentation

## 2018-02-13 DIAGNOSIS — R531 Weakness: Secondary | ICD-10-CM | POA: Diagnosis not present

## 2018-02-13 DIAGNOSIS — R634 Abnormal weight loss: Secondary | ICD-10-CM | POA: Diagnosis not present

## 2018-02-13 DIAGNOSIS — D696 Thrombocytopenia, unspecified: Secondary | ICD-10-CM

## 2018-02-13 DIAGNOSIS — F1010 Alcohol abuse, uncomplicated: Secondary | ICD-10-CM | POA: Diagnosis not present

## 2018-02-13 DIAGNOSIS — Z5112 Encounter for antineoplastic immunotherapy: Secondary | ICD-10-CM | POA: Diagnosis not present

## 2018-02-13 DIAGNOSIS — D649 Anemia, unspecified: Secondary | ICD-10-CM | POA: Diagnosis not present

## 2018-02-13 DIAGNOSIS — R5383 Other fatigue: Secondary | ICD-10-CM

## 2018-02-13 LAB — SAMPLE TO BLOOD BANK

## 2018-02-13 LAB — CBC WITH DIFFERENTIAL/PLATELET
BASOS ABS: 0 10*3/uL (ref 0–0.1)
BASOS PCT: 0 %
EOS ABS: 0 10*3/uL (ref 0–0.7)
EOS PCT: 0 %
HCT: 22.2 % — ABNORMAL LOW (ref 39.0–52.0)
Hemoglobin: 8.2 g/dL — ABNORMAL LOW (ref 13.0–17.0)
LYMPHS PCT: 1 %
Lymphs Abs: 0 10*3/uL — ABNORMAL LOW (ref 1.0–3.6)
MCH: 41.8 pg — ABNORMAL HIGH (ref 26.0–34.0)
MCHC: 36.9 g/dL — ABNORMAL HIGH (ref 30.0–36.0)
MCV: 113.1 fL — ABNORMAL HIGH (ref 78.0–100.0)
Monocytes Absolute: 0.1 10*3/uL — ABNORMAL LOW (ref 0.2–1.0)
Monocytes Relative: 1 %
NEUTROS ABS: 3.6 10*3/uL (ref 1.4–6.5)
Neutrophils Relative %: 98 %
Platelets: 58 10*3/uL — ABNORMAL LOW (ref 150–440)
RBC: 1.97 MIL/uL — AB (ref 4.22–5.81)
RDW: 14.8 % (ref 11.5–15.5)
WBC: 3.7 10*3/uL — AB (ref 3.8–10.6)

## 2018-02-13 LAB — COMPREHENSIVE METABOLIC PANEL
ALBUMIN: 4 g/dL (ref 3.5–5.0)
ALT: 24 U/L (ref 17–63)
AST: 35 U/L (ref 15–41)
Alkaline Phosphatase: 109 U/L (ref 38–126)
Anion gap: 11 (ref 5–15)
BUN: 9 mg/dL (ref 6–20)
CHLORIDE: 93 mmol/L — AB (ref 101–111)
CO2: 20 mmol/L — ABNORMAL LOW (ref 22–32)
CREATININE: 0.48 mg/dL — AB (ref 0.61–1.24)
Calcium: 8.9 mg/dL (ref 8.9–10.3)
GFR calc Af Amer: >60 mL/min (ref >60)
GFR calc non Af Amer: >60 mL/min (ref >60)
Glucose, Bld: 148 mg/dL — ABNORMAL HIGH (ref 65–99)
Potassium: 4.4 mmol/L (ref 3.5–5.1)
SODIUM: 124 mmol/L — AB (ref 135–145)
Total Bilirubin: 1.8 mg/dL — ABNORMAL HIGH (ref 0.3–1.2)
Total Protein: 6.2 g/dL — ABNORMAL LOW (ref 6.5–8.1)

## 2018-02-13 MED ORDER — GABAPENTIN 300 MG PO CAPS: 600.0000 mg | ORAL_CAPSULE | Freq: Three times a day (TID) | ORAL | 1 refills | Status: DC

## 2018-02-13 MED ORDER — DEXAMETHASONE 4 MG PO TABS
20.0000 mg | ORAL_TABLET | Freq: Every day | ORAL | Status: DC
  Administered 2018-02-13: 20 mg via ORAL
  Filled 2018-02-13: qty 5

## 2018-02-13 MED ORDER — PROCHLORPERAZINE MALEATE 10 MG PO TABS: 10.0000 mg | ORAL_TABLET | Freq: Once | ORAL | Status: DC

## 2018-02-13 MED ORDER — ONDANSETRON HCL 4 MG PO TABS
8.0000 mg | ORAL_TABLET | Freq: Once | ORAL | Status: AC
  Administered 2018-02-13: 8 mg via ORAL

## 2018-02-13 MED ORDER — ONDANSETRON 8 MG PO TBDP: 8.0000 mg | ORAL_TABLET | Freq: Once | ORAL | Status: DC

## 2018-02-13 MED ORDER — BORTEZOMIB CHEMO SQ INJECTION 3.5 MG (2.5MG/ML)
1.0000 mg/m2 | Freq: Once | INTRAMUSCULAR | Status: AC
  Administered 2018-02-13: 2 mg via SUBCUTANEOUS
  Filled 2018-02-13: qty 2

## 2018-02-13 NOTE — Progress Notes (Signed)
Patient here today for follow up regarding myeloma. Patient reports worsening pain in hands, also reports cramps and swelling in both feet.

## 2018-02-14 DIAGNOSIS — E1165 Type 2 diabetes mellitus with hyperglycemia: Secondary | ICD-10-CM | POA: Diagnosis not present

## 2018-02-14 DIAGNOSIS — C9000 Multiple myeloma not having achieved remission: Secondary | ICD-10-CM | POA: Diagnosis not present

## 2018-02-14 DIAGNOSIS — E1122 Type 2 diabetes mellitus with diabetic chronic kidney disease: Secondary | ICD-10-CM | POA: Diagnosis not present

## 2018-02-14 DIAGNOSIS — E538 Deficiency of other specified B group vitamins: Secondary | ICD-10-CM | POA: Diagnosis not present

## 2018-02-14 DIAGNOSIS — E1140 Type 2 diabetes mellitus with diabetic neuropathy, unspecified: Secondary | ICD-10-CM | POA: Diagnosis not present

## 2018-02-14 DIAGNOSIS — E039 Hypothyroidism, unspecified: Secondary | ICD-10-CM | POA: Diagnosis not present

## 2018-02-14 DIAGNOSIS — M6281 Muscle weakness (generalized): Secondary | ICD-10-CM | POA: Diagnosis not present

## 2018-02-14 DIAGNOSIS — N181 Chronic kidney disease, stage 1: Secondary | ICD-10-CM | POA: Diagnosis not present

## 2018-02-14 DIAGNOSIS — I129 Hypertensive chronic kidney disease with stage 1 through stage 4 chronic kidney disease, or unspecified chronic kidney disease: Secondary | ICD-10-CM | POA: Diagnosis not present

## 2018-02-14 LAB — KAPPA/LAMBDA LIGHT CHAINS
Kappa free light chain: 1031 mg/L — ABNORMAL HIGH (ref 3.3–19.4)
Kappa, lambda light chain ratio: 355.52 — ABNORMAL HIGH (ref 0.26–1.65)
Lambda free light chains: 2.9 mg/L — ABNORMAL LOW (ref 5.7–26.3)

## 2018-02-14 LAB — IGG, IGA, IGM
IGA: 9 mg/dL — AB (ref 61–437)
IGG (IMMUNOGLOBIN G), SERUM: 580 mg/dL — AB (ref 700–1600)
IGM (IMMUNOGLOBULIN M), SRM: 11 mg/dL — AB (ref 15–143)

## 2018-02-15 ENCOUNTER — Telehealth: Payer: Self-pay | Admitting: Family Medicine

## 2018-02-15 LAB — PROTEIN ELECTROPHORESIS, SERUM
A/G RATIO SPE: 2 — AB (ref 0.7–1.7)
ALBUMIN ELP: 3.8 g/dL (ref 2.9–4.4)
Alpha-1-Globulin: 0.3 g/dL (ref 0.0–0.4)
Alpha-2-Globulin: 0.5 g/dL (ref 0.4–1.0)
Beta Globulin: 0.8 g/dL (ref 0.7–1.3)
GAMMA GLOBULIN: 0.4 g/dL (ref 0.4–1.8)
Globulin, Total: 1.9 g/dL — ABNORMAL LOW (ref 2.2–3.9)
TOTAL PROTEIN ELP: 5.7 g/dL — AB (ref 6.0–8.5)

## 2018-02-15 NOTE — Telephone Encounter (Signed)
Routing to provider  

## 2018-02-15 NOTE — Telephone Encounter (Signed)
Copied from [place] [id number]. Topic: General - Other >> Feb 15, 2018  9:48 AM Margot Ables wrote: Reason for CRM: Patient son lives with him (77 years old). Patient cannot read. 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 The son is giving pt medication when he thinks it is needed vs when it is prescribed. Sherlynn Stalls states the son told her that the pt does not need to take amitriptyline. Son is giving the gabapentin incorrectly. Last week Sherlynn Stalls filled a pill box correctly and the patient dosed correctly and pt hand pain went from 10 out of 10 to 0 out of 10 (when taken correctly). When the son is providing the medications he is in pain and not taking correctly. The son cannot work and the pt cannot read. They have a strong co-dependency. Sherlynn Stalls is trying to give pt tools to increase fine motor skills and the son states the pt doesn't need to do that. Pt was discharged from OT yesterday and she will no longer be in the home.

## 2018-02-15 NOTE — Telephone Encounter (Signed)
Noted. Can we see if they can send out skilled nursing? I thought we had already sent that out, but that'd be helpful

## 2018-02-15 NOTE — Telephone Encounter (Signed)
Spoke with [facility]. Skilled Nursing has already been sent out. Asked her if there was anything she recommended and she said no, she just wanted to make the doctor aware.

## 2018-02-16 ENCOUNTER — Inpatient Hospital Stay: Payer: Medicare Other

## 2018-02-16 VITALS — BP 149/65 | HR 80 | Temp 98.3°F | Resp 20

## 2018-02-16 DIAGNOSIS — C9000 Multiple myeloma not having achieved remission: Secondary | ICD-10-CM | POA: Diagnosis not present

## 2018-02-16 DIAGNOSIS — Z5112 Encounter for antineoplastic immunotherapy: Secondary | ICD-10-CM | POA: Diagnosis not present

## 2018-02-16 MED ORDER — BORTEZOMIB CHEMO SQ INJECTION 3.5 MG (2.5MG/ML)
1.0000 mg/m2 | Freq: Once | INTRAMUSCULAR | Status: AC
  Administered 2018-02-16: 2 mg via SUBCUTANEOUS
  Filled 2018-02-16: qty 2

## 2018-02-16 MED ORDER — ONDANSETRON HCL 4 MG PO TABS
8.0000 mg | ORAL_TABLET | Freq: Once | ORAL | Status: AC
  Administered 2018-02-16: 8 mg via ORAL
  Filled 2018-02-16: qty 2

## 2018-02-17 DIAGNOSIS — C9000 Multiple myeloma not having achieved remission: Secondary | ICD-10-CM | POA: Diagnosis not present

## 2018-02-17 DIAGNOSIS — N181 Chronic kidney disease, stage 1: Secondary | ICD-10-CM | POA: Diagnosis not present

## 2018-02-17 DIAGNOSIS — E039 Hypothyroidism, unspecified: Secondary | ICD-10-CM | POA: Diagnosis not present

## 2018-02-17 DIAGNOSIS — M6281 Muscle weakness (generalized): Secondary | ICD-10-CM | POA: Diagnosis not present

## 2018-02-17 DIAGNOSIS — E1165 Type 2 diabetes mellitus with hyperglycemia: Secondary | ICD-10-CM | POA: Diagnosis not present

## 2018-02-17 DIAGNOSIS — I129 Hypertensive chronic kidney disease with stage 1 through stage 4 chronic kidney disease, or unspecified chronic kidney disease: Secondary | ICD-10-CM | POA: Diagnosis not present

## 2018-02-17 DIAGNOSIS — E1122 Type 2 diabetes mellitus with diabetic chronic kidney disease: Secondary | ICD-10-CM | POA: Diagnosis not present

## 2018-02-17 DIAGNOSIS — E1140 Type 2 diabetes mellitus with diabetic neuropathy, unspecified: Secondary | ICD-10-CM | POA: Diagnosis not present

## 2018-02-17 DIAGNOSIS — E538 Deficiency of other specified B group vitamins: Secondary | ICD-10-CM | POA: Diagnosis not present

## 2018-02-20 ENCOUNTER — Inpatient Hospital Stay: Payer: Medicare Other

## 2018-02-20 VITALS — BP 123/63 | HR 71 | Temp 96.9°F | Resp 18

## 2018-02-20 DIAGNOSIS — C9000 Multiple myeloma not having achieved remission: Secondary | ICD-10-CM

## 2018-02-20 DIAGNOSIS — Z5112 Encounter for antineoplastic immunotherapy: Secondary | ICD-10-CM | POA: Diagnosis not present

## 2018-02-20 LAB — CBC WITH DIFFERENTIAL/PLATELET
BASOS ABS: 0 10*3/uL (ref 0–0.1)
Basophils Relative: 0 %
EOS ABS: 0 10*3/uL (ref 0–0.7)
EOS PCT: 0 %
HCT: 23.9 % — ABNORMAL LOW (ref 40.0–52.0)
Hemoglobin: 8.8 g/dL — ABNORMAL LOW (ref 13.0–18.0)
LYMPHS PCT: 3 %
Lymphs Abs: 0.1 10*3/uL — ABNORMAL LOW (ref 1.0–3.6)
MCH: 41.6 pg — ABNORMAL HIGH (ref 26.0–34.0)
MCHC: 36.9 g/dL — AB (ref 32.0–36.0)
MCV: 112.6 fL — ABNORMAL HIGH (ref 80.0–100.0)
Monocytes Absolute: 0.1 10*3/uL — ABNORMAL LOW (ref 0.2–1.0)
Monocytes Relative: 2 %
Neutro Abs: 2.7 10*3/uL (ref 1.4–6.5)
Neutrophils Relative %: 95 %
PLATELETS: 49 10*3/uL — AB (ref 150–440)
RBC: 2.12 MIL/uL — AB (ref 4.40–5.90)
RDW: 14.9 % — AB (ref 11.5–14.5)
WBC: 2.9 10*3/uL — AB (ref 3.8–10.6)
nRBC: 1 /100 WBC — ABNORMAL HIGH

## 2018-02-20 LAB — COMPREHENSIVE METABOLIC PANEL
ALT: 23 U/L (ref 17–63)
AST: 32 U/L (ref 15–41)
Albumin: 3.9 g/dL (ref 3.5–5.0)
Alkaline Phosphatase: 97 U/L (ref 38–126)
Anion gap: 11 (ref 5–15)
BUN: 9 mg/dL (ref 6–20)
CHLORIDE: 93 mmol/L — AB (ref 101–111)
CO2: 22 mmol/L (ref 22–32)
CREATININE: 0.54 mg/dL — AB (ref 0.61–1.24)
Calcium: 8.7 mg/dL — ABNORMAL LOW (ref 8.9–10.3)
GFR calc Af Amer: >60 mL/min (ref >60)
GFR calc non Af Amer: >60 mL/min (ref >60)
Glucose, Bld: 163 mg/dL — ABNORMAL HIGH (ref 65–99)
Potassium: 4.5 mmol/L (ref 3.5–5.1)
SODIUM: 126 mmol/L — AB (ref 135–145)
Total Bilirubin: 1.7 mg/dL — ABNORMAL HIGH (ref 0.3–1.2)
Total Protein: 5.9 g/dL — ABNORMAL LOW (ref 6.5–8.1)

## 2018-02-20 LAB — SAMPLE TO BLOOD BANK

## 2018-02-20 MED ORDER — BORTEZOMIB CHEMO SQ INJECTION 3.5 MG (2.5MG/ML)
1.0000 mg/m2 | Freq: Once | INTRAMUSCULAR | Status: AC
  Administered 2018-02-20: 2 mg via SUBCUTANEOUS
  Filled 2018-02-20: qty 2

## 2018-02-20 MED ORDER — PROCHLORPERAZINE MALEATE 5 MG PO TABS
10.0000 mg | ORAL_TABLET | Freq: Once | ORAL | Status: AC
  Administered 2018-02-20: 10 mg via ORAL
  Filled 2018-02-20: qty 2

## 2018-02-20 NOTE — Progress Notes (Signed)
Reviewed parameters with Dr. Grayland Ormond, per Dr. Grayland Ormond proceed with velcade today.

## 2018-02-20 NOTE — Progress Notes (Signed)
Nutrition Follow-up:  Patient with multiple myeloma.  Patient receiving velcade.  Followed by Dr. Grayland Ormond  Met with patient and son during treatment today.  Patient reports does not feel like eating sometimes.  Reports usually eats 2 meals per day (breakfast and supper).  Drinks ensure shakes in between meals.   Reports that he walks with a walker.     Medications: reviewed  Labs: reviewed  Anthropometrics:   Weight increased to 183 lb noted on 5/6 UBW not known per patient.   Noted in Jan 2019 wt of 179 lb   NUTRITION DIAGNOSIS: Inadequate oral intake improved with weight gain   MALNUTRITION DIAGNOSIS: severe malnutrition improving with weight gain   INTERVENTION:   Provided patient with 2nd case of ensure enlive. Encouraged patient to continue to foods high in calories and protein (likes pork and beans, peanut butter and jelly, ensure). Megace has been offered and declined by patient per provider note.    MONITORING, EVALUATION, GOAL: weight trends, intake   NEXT VISIT: as needed  Leannah Guse B. Zenia Resides, [place], [place] Registered Dietitian [phone] (pager)

## 2018-02-21 DIAGNOSIS — C9000 Multiple myeloma not having achieved remission: Secondary | ICD-10-CM | POA: Diagnosis not present

## 2018-02-21 DIAGNOSIS — M6281 Muscle weakness (generalized): Secondary | ICD-10-CM | POA: Diagnosis not present

## 2018-02-21 DIAGNOSIS — E538 Deficiency of other specified B group vitamins: Secondary | ICD-10-CM | POA: Diagnosis not present

## 2018-02-21 DIAGNOSIS — E1140 Type 2 diabetes mellitus with diabetic neuropathy, unspecified: Secondary | ICD-10-CM | POA: Diagnosis not present

## 2018-02-21 DIAGNOSIS — N181 Chronic kidney disease, stage 1: Secondary | ICD-10-CM | POA: Diagnosis not present

## 2018-02-21 DIAGNOSIS — E1165 Type 2 diabetes mellitus with hyperglycemia: Secondary | ICD-10-CM | POA: Diagnosis not present

## 2018-02-21 DIAGNOSIS — I129 Hypertensive chronic kidney disease with stage 1 through stage 4 chronic kidney disease, or unspecified chronic kidney disease: Secondary | ICD-10-CM | POA: Diagnosis not present

## 2018-02-21 DIAGNOSIS — E1122 Type 2 diabetes mellitus with diabetic chronic kidney disease: Secondary | ICD-10-CM | POA: Diagnosis not present

## 2018-02-21 DIAGNOSIS — E039 Hypothyroidism, unspecified: Secondary | ICD-10-CM | POA: Diagnosis not present

## 2018-02-23 ENCOUNTER — Inpatient Hospital Stay: Payer: Medicare Other

## 2018-02-23 DIAGNOSIS — C9000 Multiple myeloma not having achieved remission: Secondary | ICD-10-CM

## 2018-02-23 DIAGNOSIS — Z5112 Encounter for antineoplastic immunotherapy: Secondary | ICD-10-CM | POA: Diagnosis not present

## 2018-02-23 MED ORDER — PROCHLORPERAZINE MALEATE 10 MG PO TABS
10.0000 mg | ORAL_TABLET | Freq: Once | ORAL | Status: AC
  Administered 2018-02-23: 10 mg via ORAL
  Filled 2018-02-23: qty 1

## 2018-02-23 MED ORDER — BORTEZOMIB CHEMO SQ INJECTION 3.5 MG (2.5MG/ML)
1.0000 mg/m2 | Freq: Once | INTRAMUSCULAR | Status: AC
  Administered 2018-02-23: 2 mg via SUBCUTANEOUS
  Filled 2018-02-23: qty 0.8

## 2018-02-23 MED ORDER — DEXAMETHASONE 4 MG PO TABS
20.0000 mg | ORAL_TABLET | Freq: Once | ORAL | Status: AC
  Administered 2018-02-23: 20 mg via ORAL
  Filled 2018-02-23: qty 5

## 2018-02-24 ENCOUNTER — Ambulatory Visit (INDEPENDENT_AMBULATORY_CARE_PROVIDER_SITE_OTHER): Payer: Medicare Other | Admitting: Family Medicine

## 2018-02-24 ENCOUNTER — Encounter: Payer: Self-pay | Admitting: Family Medicine

## 2018-02-24 VITALS — BP 128/63 | HR 74 | Temp 98.3°F | Wt 181.2 lb

## 2018-02-24 DIAGNOSIS — E1165 Type 2 diabetes mellitus with hyperglycemia: Secondary | ICD-10-CM | POA: Diagnosis not present

## 2018-02-24 DIAGNOSIS — E039 Hypothyroidism, unspecified: Secondary | ICD-10-CM | POA: Diagnosis not present

## 2018-02-24 DIAGNOSIS — G629 Polyneuropathy, unspecified: Secondary | ICD-10-CM

## 2018-02-24 DIAGNOSIS — E538 Deficiency of other specified B group vitamins: Secondary | ICD-10-CM | POA: Diagnosis not present

## 2018-02-24 DIAGNOSIS — M6281 Muscle weakness (generalized): Secondary | ICD-10-CM | POA: Diagnosis not present

## 2018-02-24 DIAGNOSIS — E1140 Type 2 diabetes mellitus with diabetic neuropathy, unspecified: Secondary | ICD-10-CM

## 2018-02-24 DIAGNOSIS — I129 Hypertensive chronic kidney disease with stage 1 through stage 4 chronic kidney disease, or unspecified chronic kidney disease: Secondary | ICD-10-CM | POA: Diagnosis not present

## 2018-02-24 DIAGNOSIS — N181 Chronic kidney disease, stage 1: Secondary | ICD-10-CM | POA: Diagnosis not present

## 2018-02-24 DIAGNOSIS — E1122 Type 2 diabetes mellitus with diabetic chronic kidney disease: Secondary | ICD-10-CM | POA: Diagnosis not present

## 2018-02-24 DIAGNOSIS — C9000 Multiple myeloma not having achieved remission: Secondary | ICD-10-CM | POA: Diagnosis not present

## 2018-02-24 LAB — BAYER DCA HB A1C WAIVED: HB A1C (BAYER DCA - WAIVED): 6 % (ref <7.0)

## 2018-02-24 NOTE — Assessment & Plan Note (Signed)
Does not seem to be taking his medicine as prescribed. States his hands are getting worse. Will try to get him pill pack which may make it easier for him to take his medicine. We are working on getting this set up.

## 2018-02-24 NOTE — Assessment & Plan Note (Signed)
B12 shot given today  

## 2018-02-24 NOTE — Assessment & Plan Note (Signed)
Did not get TSH drawn at the cancer center. Refuses lab draw here. Will reach out to Dr. Grayland Ormond to make sure they get it drawn next visit at the cancer center so we can adjust dose appropriately. Continue current dose for now. Await results.

## 2018-02-24 NOTE — Progress Notes (Signed)
BP 128/63 (BP Location: Left Arm, Patient Position: Sitting, Cuff Size: Normal)   Pulse 74   Temp 98.3 F (36.8 C)   Wt 181 lb 3 oz (82.2 kg)   SpO2 97%   BMI 26.76 kg/m    Subjective:    Patient ID: [name], male    DOB: [birth year], 77 y.o.   MRN: [id number]  HPI: [name] is a 77 y.o. male  Chief Complaint  Patient presents with  . Diabetes  . Hypothyroidism   DIABETES Hypoglycemic episodes:no Polydipsia/polyuria: no Visual disturbance: no Chest pain: no Paresthesias: yes Glucose Monitoring: no Taking Insulin?: no Blood Pressure Monitoring: not checking Retinal Examination: Not up to Date Foot Exam: Up to Date Diabetic Education: Not Completed Pneumovax: Up to Date Influenza: Up to Date Aspirin: no  HYPOTHYROIDISM Thyroid control status:unknown Satisfied with current treatment? yes Medication side effects: no Medication compliance: unclear Recent dose adjustment:yes Fatigue: yes Cold intolerance: yes Heat intolerance: no Weight gain: no Weight loss: no Constipation: no Diarrhea/loose stools: no Palpitations: no Lower extremity edema: no Anxiety/depressed mood: no  Feels like his paresthesias are getting worse.   Relevant past medical, surgical, family and social history reviewed and updated as indicated. Interim medical history since our last visit reviewed. Allergies and medications reviewed and updated.  Review of Systems  Constitutional: Negative.   Respiratory: Negative.   Cardiovascular: Negative.   Neurological: Positive for numbness. Negative for dizziness, tremors, seizures, syncope, facial asymmetry, speech difficulty, weakness, light-headedness and headaches.  Psychiatric/Behavioral: Negative.    Per HPI unless specifically indicated above     Objective:    BP 128/63 (BP Location: Left Arm, Patient Position: Sitting, Cuff Size: Normal)   Pulse 74   Temp 98.3 F (36.8 C)   Wt 181 lb 3 oz (82.2 kg)   SpO2 97%   BMI  26.76 kg/m   Wt Readings from Last 3 Encounters:  02/24/18 181 lb 3 oz (82.2 kg)  02/13/18 183 lb 8 oz (83.2 kg)  01/30/18 175 lb (79.4 kg)    Physical Exam  Constitutional: He is oriented to person, place, and time. He appears well-developed and well-nourished. He appears ill. No distress.  HENT:  Head: Normocephalic and atraumatic.  Right Ear: Hearing normal.  Left Ear: Hearing normal.  Nose: Nose normal.  Eyes: Conjunctivae and lids are normal. Right eye exhibits no discharge. Left eye exhibits no discharge. No scleral icterus.  Cardiovascular: Normal rate, regular rhythm, normal heart sounds and intact distal pulses. Exam reveals no gallop and no friction rub.  No murmur heard. Pulmonary/Chest: Effort normal and breath sounds normal. No stridor. No respiratory distress. He has no wheezes. He has no rales. He exhibits no tenderness.  Musculoskeletal: Normal range of motion.  Neurological: He is alert and oriented to person, place, and time.  Skin: Skin is warm and intact. Capillary refill takes less than 2 seconds. No rash noted. He is not diaphoretic. No erythema. No pallor.  Psychiatric: He has a normal mood and affect. His speech is normal and behavior is normal. Judgment and thought content normal. Cognition and memory are normal.  Nursing note and vitals reviewed.   Results for orders placed or performed in visit on 02/20/18  Comprehensive metabolic panel  Result Value Ref Range   Sodium 126 (L) 135 - 145 mmol/L   Potassium 4.5 3.5 - 5.1 mmol/L   Chloride 93 (L) 101 - 111 mmol/L   CO2 22 22 - 32 mmol/L  Glucose, Bld 163 (H) 65 - 99 mg/dL   BUN 9 6 - 20 mg/dL   Creatinine, Ser 0.54 (L) 0.61 - 1.24 mg/dL   Calcium 8.7 (L) 8.9 - 10.3 mg/dL   Total Protein 5.9 (L) 6.5 - 8.1 g/dL   Albumin 3.9 3.5 - 5.0 g/dL   AST 32 15 - 41 U/L   ALT 23 17 - 63 U/L   Alkaline Phosphatase 97 38 - 126 U/L   Total Bilirubin 1.7 (H) 0.3 - 1.2 mg/dL   GFR calc non Af Amer >60 >60 mL/min    GFR calc Af Amer >60 >60 mL/min   Anion gap 11 5 - 15  CBC with Differential  Result Value Ref Range   WBC 2.9 (L) 3.8 - 10.6 K/uL   RBC 2.12 (L) 4.40 - 5.90 MIL/uL   Hemoglobin 8.8 (L) 13.0 - 18.0 g/dL   HCT 23.9 (L) 40.0 - 52.0 %   MCV 112.6 (H) 80.0 - 100.0 fL   MCH 41.6 (H) 26.0 - 34.0 pg   MCHC 36.9 (H) 32.0 - 36.0 g/dL   RDW 14.9 (H) 11.5 - 14.5 %   Platelets 49 (L) 150 - 440 K/uL   Neutrophils Relative % 95 %   Neutro Abs 2.7 1.4 - 6.5 K/uL   Lymphocytes Relative 3 %   Lymphs Abs 0.1 (L) 1.0 - 3.6 K/uL   Monocytes Relative 2 %   Monocytes Absolute 0.1 (L) 0.2 - 1.0 K/uL   Eosinophils Relative 0 %   Eosinophils Absolute 0.0 0 - 0.7 K/uL   Basophils Relative 0 %   Basophils Absolute 0.0 0 - 0.1 K/uL   RBC Morphology RARE NRBCs    nRBC 1 (H) 0 /100 WBC  Hold Tube- Blood Bank  Result Value Ref Range   Blood Bank Specimen SAMPLE AVAILABLE FOR TESTING    Sample Expiration      02/23/2018 Performed at [facility], [address] [place], [place] [address]       Assessment & Plan:   Problem List Items Addressed This Visit      Endocrine   Type 2 diabetes, controlled, with neuropathy ([place]) - Primary    A1c 6.0 off medications. Continue to monitor. Continue to hold medicine as it makes his BP drop. Call with any concerns.       Relevant Orders   Bayer DCA Hb A1c Waived   Hypothyroidism    Did not get TSH drawn at the cancer center. Refuses lab draw here. Will reach out to Dr. Grayland Ormond to make sure they get it drawn next visit at the cancer center so we can adjust dose appropriately. Continue current dose for now. Await results.       Relevant Orders   Thyroid Panel With TSH     Nervous and Auditory   Peripheral neuropathy    Does not seem to be taking his medicine as prescribed. States his hands are getting worse. Will try to get him pill pack which may make it easier for him to take his medicine. We are working on getting this set up.         Other     B12 deficiency    B12 shot given today.           Follow up plan: Return in about 7 weeks (around 04/14/2018).

## 2018-02-24 NOTE — Assessment & Plan Note (Signed)
A1c 6.0 off medications. Continue to monitor. Continue to hold medicine as it makes his BP drop. Call with any concerns.

## 2018-02-27 DIAGNOSIS — E1122 Type 2 diabetes mellitus with diabetic chronic kidney disease: Secondary | ICD-10-CM | POA: Diagnosis not present

## 2018-02-27 DIAGNOSIS — E1165 Type 2 diabetes mellitus with hyperglycemia: Secondary | ICD-10-CM | POA: Diagnosis not present

## 2018-02-27 DIAGNOSIS — I129 Hypertensive chronic kidney disease with stage 1 through stage 4 chronic kidney disease, or unspecified chronic kidney disease: Secondary | ICD-10-CM | POA: Diagnosis not present

## 2018-02-27 DIAGNOSIS — E1140 Type 2 diabetes mellitus with diabetic neuropathy, unspecified: Secondary | ICD-10-CM | POA: Diagnosis not present

## 2018-02-27 DIAGNOSIS — C9000 Multiple myeloma not having achieved remission: Secondary | ICD-10-CM | POA: Diagnosis not present

## 2018-02-27 DIAGNOSIS — M6281 Muscle weakness (generalized): Secondary | ICD-10-CM | POA: Diagnosis not present

## 2018-02-27 DIAGNOSIS — N181 Chronic kidney disease, stage 1: Secondary | ICD-10-CM | POA: Diagnosis not present

## 2018-02-27 DIAGNOSIS — E039 Hypothyroidism, unspecified: Secondary | ICD-10-CM | POA: Diagnosis not present

## 2018-02-27 DIAGNOSIS — E538 Deficiency of other specified B group vitamins: Secondary | ICD-10-CM | POA: Diagnosis not present

## 2018-02-28 DIAGNOSIS — E1122 Type 2 diabetes mellitus with diabetic chronic kidney disease: Secondary | ICD-10-CM | POA: Diagnosis not present

## 2018-02-28 DIAGNOSIS — M6281 Muscle weakness (generalized): Secondary | ICD-10-CM | POA: Diagnosis not present

## 2018-02-28 DIAGNOSIS — E1165 Type 2 diabetes mellitus with hyperglycemia: Secondary | ICD-10-CM | POA: Diagnosis not present

## 2018-02-28 DIAGNOSIS — E538 Deficiency of other specified B group vitamins: Secondary | ICD-10-CM | POA: Diagnosis not present

## 2018-02-28 DIAGNOSIS — C9000 Multiple myeloma not having achieved remission: Secondary | ICD-10-CM | POA: Diagnosis not present

## 2018-02-28 DIAGNOSIS — E039 Hypothyroidism, unspecified: Secondary | ICD-10-CM | POA: Diagnosis not present

## 2018-02-28 DIAGNOSIS — I129 Hypertensive chronic kidney disease with stage 1 through stage 4 chronic kidney disease, or unspecified chronic kidney disease: Secondary | ICD-10-CM | POA: Diagnosis not present

## 2018-02-28 DIAGNOSIS — N181 Chronic kidney disease, stage 1: Secondary | ICD-10-CM | POA: Diagnosis not present

## 2018-02-28 DIAGNOSIS — E1140 Type 2 diabetes mellitus with diabetic neuropathy, unspecified: Secondary | ICD-10-CM | POA: Diagnosis not present

## 2018-03-01 ENCOUNTER — Other Ambulatory Visit: Payer: Self-pay

## 2018-03-01 DIAGNOSIS — Z789 Other specified health status: Secondary | ICD-10-CM

## 2018-03-03 DIAGNOSIS — E1140 Type 2 diabetes mellitus with diabetic neuropathy, unspecified: Secondary | ICD-10-CM | POA: Diagnosis not present

## 2018-03-03 DIAGNOSIS — N181 Chronic kidney disease, stage 1: Secondary | ICD-10-CM | POA: Diagnosis not present

## 2018-03-03 DIAGNOSIS — M6281 Muscle weakness (generalized): Secondary | ICD-10-CM | POA: Diagnosis not present

## 2018-03-03 DIAGNOSIS — E1165 Type 2 diabetes mellitus with hyperglycemia: Secondary | ICD-10-CM | POA: Diagnosis not present

## 2018-03-03 DIAGNOSIS — E039 Hypothyroidism, unspecified: Secondary | ICD-10-CM | POA: Diagnosis not present

## 2018-03-03 DIAGNOSIS — E1122 Type 2 diabetes mellitus with diabetic chronic kidney disease: Secondary | ICD-10-CM | POA: Diagnosis not present

## 2018-03-03 DIAGNOSIS — E538 Deficiency of other specified B group vitamins: Secondary | ICD-10-CM | POA: Diagnosis not present

## 2018-03-03 DIAGNOSIS — I129 Hypertensive chronic kidney disease with stage 1 through stage 4 chronic kidney disease, or unspecified chronic kidney disease: Secondary | ICD-10-CM | POA: Diagnosis not present

## 2018-03-03 DIAGNOSIS — C9000 Multiple myeloma not having achieved remission: Secondary | ICD-10-CM | POA: Diagnosis not present

## 2018-03-06 DIAGNOSIS — N181 Chronic kidney disease, stage 1: Secondary | ICD-10-CM | POA: Diagnosis not present

## 2018-03-06 DIAGNOSIS — E1165 Type 2 diabetes mellitus with hyperglycemia: Secondary | ICD-10-CM | POA: Diagnosis not present

## 2018-03-06 DIAGNOSIS — E1122 Type 2 diabetes mellitus with diabetic chronic kidney disease: Secondary | ICD-10-CM | POA: Diagnosis not present

## 2018-03-06 DIAGNOSIS — I129 Hypertensive chronic kidney disease with stage 1 through stage 4 chronic kidney disease, or unspecified chronic kidney disease: Secondary | ICD-10-CM | POA: Diagnosis not present

## 2018-03-06 DIAGNOSIS — M6281 Muscle weakness (generalized): Secondary | ICD-10-CM | POA: Diagnosis not present

## 2018-03-06 DIAGNOSIS — E039 Hypothyroidism, unspecified: Secondary | ICD-10-CM | POA: Diagnosis not present

## 2018-03-06 DIAGNOSIS — E1140 Type 2 diabetes mellitus with diabetic neuropathy, unspecified: Secondary | ICD-10-CM | POA: Diagnosis not present

## 2018-03-06 DIAGNOSIS — C9000 Multiple myeloma not having achieved remission: Secondary | ICD-10-CM | POA: Diagnosis not present

## 2018-03-06 DIAGNOSIS — E538 Deficiency of other specified B group vitamins: Secondary | ICD-10-CM | POA: Diagnosis not present

## 2018-03-07 DIAGNOSIS — I129 Hypertensive chronic kidney disease with stage 1 through stage 4 chronic kidney disease, or unspecified chronic kidney disease: Secondary | ICD-10-CM | POA: Diagnosis not present

## 2018-03-07 DIAGNOSIS — E039 Hypothyroidism, unspecified: Secondary | ICD-10-CM | POA: Diagnosis not present

## 2018-03-07 DIAGNOSIS — M6281 Muscle weakness (generalized): Secondary | ICD-10-CM | POA: Diagnosis not present

## 2018-03-07 DIAGNOSIS — E1122 Type 2 diabetes mellitus with diabetic chronic kidney disease: Secondary | ICD-10-CM | POA: Diagnosis not present

## 2018-03-07 DIAGNOSIS — N181 Chronic kidney disease, stage 1: Secondary | ICD-10-CM | POA: Diagnosis not present

## 2018-03-07 DIAGNOSIS — E1140 Type 2 diabetes mellitus with diabetic neuropathy, unspecified: Secondary | ICD-10-CM | POA: Diagnosis not present

## 2018-03-07 DIAGNOSIS — E538 Deficiency of other specified B group vitamins: Secondary | ICD-10-CM | POA: Diagnosis not present

## 2018-03-07 DIAGNOSIS — E1165 Type 2 diabetes mellitus with hyperglycemia: Secondary | ICD-10-CM | POA: Diagnosis not present

## 2018-03-07 DIAGNOSIS — C9000 Multiple myeloma not having achieved remission: Secondary | ICD-10-CM | POA: Diagnosis not present

## 2018-03-08 DIAGNOSIS — I129 Hypertensive chronic kidney disease with stage 1 through stage 4 chronic kidney disease, or unspecified chronic kidney disease: Secondary | ICD-10-CM | POA: Diagnosis not present

## 2018-03-08 DIAGNOSIS — E1140 Type 2 diabetes mellitus with diabetic neuropathy, unspecified: Secondary | ICD-10-CM | POA: Diagnosis not present

## 2018-03-08 DIAGNOSIS — E039 Hypothyroidism, unspecified: Secondary | ICD-10-CM | POA: Diagnosis not present

## 2018-03-08 DIAGNOSIS — E1165 Type 2 diabetes mellitus with hyperglycemia: Secondary | ICD-10-CM | POA: Diagnosis not present

## 2018-03-08 DIAGNOSIS — N181 Chronic kidney disease, stage 1: Secondary | ICD-10-CM | POA: Diagnosis not present

## 2018-03-08 DIAGNOSIS — E538 Deficiency of other specified B group vitamins: Secondary | ICD-10-CM | POA: Diagnosis not present

## 2018-03-08 DIAGNOSIS — C9000 Multiple myeloma not having achieved remission: Secondary | ICD-10-CM | POA: Diagnosis not present

## 2018-03-08 DIAGNOSIS — E1122 Type 2 diabetes mellitus with diabetic chronic kidney disease: Secondary | ICD-10-CM | POA: Diagnosis not present

## 2018-03-08 DIAGNOSIS — M6281 Muscle weakness (generalized): Secondary | ICD-10-CM | POA: Diagnosis not present

## 2018-03-14 ENCOUNTER — Telehealth: Payer: Self-pay | Admitting: *Deleted

## 2018-03-14 DIAGNOSIS — E039 Hypothyroidism, unspecified: Secondary | ICD-10-CM | POA: Diagnosis not present

## 2018-03-14 DIAGNOSIS — E1122 Type 2 diabetes mellitus with diabetic chronic kidney disease: Secondary | ICD-10-CM | POA: Diagnosis not present

## 2018-03-14 DIAGNOSIS — E1140 Type 2 diabetes mellitus with diabetic neuropathy, unspecified: Secondary | ICD-10-CM | POA: Diagnosis not present

## 2018-03-14 DIAGNOSIS — C9000 Multiple myeloma not having achieved remission: Secondary | ICD-10-CM | POA: Diagnosis not present

## 2018-03-14 DIAGNOSIS — E1165 Type 2 diabetes mellitus with hyperglycemia: Secondary | ICD-10-CM | POA: Diagnosis not present

## 2018-03-14 DIAGNOSIS — E538 Deficiency of other specified B group vitamins: Secondary | ICD-10-CM | POA: Diagnosis not present

## 2018-03-14 DIAGNOSIS — I129 Hypertensive chronic kidney disease with stage 1 through stage 4 chronic kidney disease, or unspecified chronic kidney disease: Secondary | ICD-10-CM | POA: Diagnosis not present

## 2018-03-14 DIAGNOSIS — N181 Chronic kidney disease, stage 1: Secondary | ICD-10-CM | POA: Diagnosis not present

## 2018-03-14 NOTE — Telephone Encounter (Signed)
Left vm for patient regarding bone marrow biopsy. CT guided bone marrow biopsy scheduled for 6/18 at 8:30 am; arrive at 7:30 am in [facility]. NPO after midnight.

## 2018-03-15 DIAGNOSIS — E039 Hypothyroidism, unspecified: Secondary | ICD-10-CM | POA: Diagnosis not present

## 2018-03-15 DIAGNOSIS — C9000 Multiple myeloma not having achieved remission: Secondary | ICD-10-CM | POA: Diagnosis not present

## 2018-03-15 DIAGNOSIS — E538 Deficiency of other specified B group vitamins: Secondary | ICD-10-CM | POA: Diagnosis not present

## 2018-03-15 DIAGNOSIS — E1140 Type 2 diabetes mellitus with diabetic neuropathy, unspecified: Secondary | ICD-10-CM | POA: Diagnosis not present

## 2018-03-15 DIAGNOSIS — E1165 Type 2 diabetes mellitus with hyperglycemia: Secondary | ICD-10-CM | POA: Diagnosis not present

## 2018-03-15 DIAGNOSIS — I129 Hypertensive chronic kidney disease with stage 1 through stage 4 chronic kidney disease, or unspecified chronic kidney disease: Secondary | ICD-10-CM | POA: Diagnosis not present

## 2018-03-15 DIAGNOSIS — E1122 Type 2 diabetes mellitus with diabetic chronic kidney disease: Secondary | ICD-10-CM | POA: Diagnosis not present

## 2018-03-15 DIAGNOSIS — N181 Chronic kidney disease, stage 1: Secondary | ICD-10-CM | POA: Diagnosis not present

## 2018-03-17 ENCOUNTER — Telehealth: Payer: Self-pay | Admitting: Family Medicine

## 2018-03-17 DIAGNOSIS — E1165 Type 2 diabetes mellitus with hyperglycemia: Secondary | ICD-10-CM | POA: Diagnosis not present

## 2018-03-17 DIAGNOSIS — E039 Hypothyroidism, unspecified: Secondary | ICD-10-CM | POA: Diagnosis not present

## 2018-03-17 DIAGNOSIS — C9000 Multiple myeloma not having achieved remission: Secondary | ICD-10-CM | POA: Diagnosis not present

## 2018-03-17 DIAGNOSIS — E1140 Type 2 diabetes mellitus with diabetic neuropathy, unspecified: Secondary | ICD-10-CM | POA: Diagnosis not present

## 2018-03-17 DIAGNOSIS — E538 Deficiency of other specified B group vitamins: Secondary | ICD-10-CM | POA: Diagnosis not present

## 2018-03-17 DIAGNOSIS — I129 Hypertensive chronic kidney disease with stage 1 through stage 4 chronic kidney disease, or unspecified chronic kidney disease: Secondary | ICD-10-CM | POA: Diagnosis not present

## 2018-03-17 DIAGNOSIS — E1122 Type 2 diabetes mellitus with diabetic chronic kidney disease: Secondary | ICD-10-CM | POA: Diagnosis not present

## 2018-03-17 DIAGNOSIS — N181 Chronic kidney disease, stage 1: Secondary | ICD-10-CM | POA: Diagnosis not present

## 2018-03-17 NOTE — Telephone Encounter (Signed)
Called and left verbal on Carolyn's voicemail.

## 2018-03-17 NOTE — Telephone Encounter (Signed)
OK to give verbals. Thanks!

## 2018-03-17 NOTE — Telephone Encounter (Signed)
Copied from [place] [id number]. Topic: Quick Communication - See Telephone Encounter >> Mar 17, 2018 12:05 PM Ether Griffins B wrote: CRM for notification. See Telephone encounter for: 03/17/18.  Hoyle Sauer with [employer] calling requesting verbal orders for PT 2 x 8. She did his re certification this morning.  CB# [phone].

## 2018-03-21 DIAGNOSIS — E039 Hypothyroidism, unspecified: Secondary | ICD-10-CM | POA: Diagnosis not present

## 2018-03-21 DIAGNOSIS — E1122 Type 2 diabetes mellitus with diabetic chronic kidney disease: Secondary | ICD-10-CM | POA: Diagnosis not present

## 2018-03-21 DIAGNOSIS — I129 Hypertensive chronic kidney disease with stage 1 through stage 4 chronic kidney disease, or unspecified chronic kidney disease: Secondary | ICD-10-CM | POA: Diagnosis not present

## 2018-03-21 DIAGNOSIS — E538 Deficiency of other specified B group vitamins: Secondary | ICD-10-CM | POA: Diagnosis not present

## 2018-03-21 DIAGNOSIS — E1140 Type 2 diabetes mellitus with diabetic neuropathy, unspecified: Secondary | ICD-10-CM | POA: Diagnosis not present

## 2018-03-21 DIAGNOSIS — E1165 Type 2 diabetes mellitus with hyperglycemia: Secondary | ICD-10-CM | POA: Diagnosis not present

## 2018-03-21 DIAGNOSIS — C9000 Multiple myeloma not having achieved remission: Secondary | ICD-10-CM | POA: Diagnosis not present

## 2018-03-21 DIAGNOSIS — N181 Chronic kidney disease, stage 1: Secondary | ICD-10-CM | POA: Diagnosis not present

## 2018-03-22 DIAGNOSIS — I129 Hypertensive chronic kidney disease with stage 1 through stage 4 chronic kidney disease, or unspecified chronic kidney disease: Secondary | ICD-10-CM | POA: Diagnosis not present

## 2018-03-22 DIAGNOSIS — E1140 Type 2 diabetes mellitus with diabetic neuropathy, unspecified: Secondary | ICD-10-CM | POA: Diagnosis not present

## 2018-03-22 DIAGNOSIS — C9000 Multiple myeloma not having achieved remission: Secondary | ICD-10-CM | POA: Diagnosis not present

## 2018-03-22 DIAGNOSIS — E1165 Type 2 diabetes mellitus with hyperglycemia: Secondary | ICD-10-CM | POA: Diagnosis not present

## 2018-03-22 DIAGNOSIS — N181 Chronic kidney disease, stage 1: Secondary | ICD-10-CM | POA: Diagnosis not present

## 2018-03-22 DIAGNOSIS — E538 Deficiency of other specified B group vitamins: Secondary | ICD-10-CM | POA: Diagnosis not present

## 2018-03-22 DIAGNOSIS — E1122 Type 2 diabetes mellitus with diabetic chronic kidney disease: Secondary | ICD-10-CM | POA: Diagnosis not present

## 2018-03-22 DIAGNOSIS — E039 Hypothyroidism, unspecified: Secondary | ICD-10-CM | POA: Diagnosis not present

## 2018-03-24 ENCOUNTER — Ambulatory Visit (INDEPENDENT_AMBULATORY_CARE_PROVIDER_SITE_OTHER): Payer: Medicare Other | Admitting: Physician Assistant

## 2018-03-24 ENCOUNTER — Encounter: Payer: Self-pay | Admitting: Physician Assistant

## 2018-03-24 VITALS — BP 144/57 | HR 73 | Temp 97.6°F | Wt 163.6 lb

## 2018-03-24 DIAGNOSIS — E1122 Type 2 diabetes mellitus with diabetic chronic kidney disease: Secondary | ICD-10-CM | POA: Diagnosis not present

## 2018-03-24 DIAGNOSIS — L89322 Pressure ulcer of left buttock, stage 2: Secondary | ICD-10-CM

## 2018-03-24 DIAGNOSIS — C9000 Multiple myeloma not having achieved remission: Secondary | ICD-10-CM | POA: Diagnosis not present

## 2018-03-24 DIAGNOSIS — E1140 Type 2 diabetes mellitus with diabetic neuropathy, unspecified: Secondary | ICD-10-CM | POA: Diagnosis not present

## 2018-03-24 DIAGNOSIS — E538 Deficiency of other specified B group vitamins: Secondary | ICD-10-CM | POA: Diagnosis not present

## 2018-03-24 DIAGNOSIS — R630 Anorexia: Secondary | ICD-10-CM

## 2018-03-24 DIAGNOSIS — E1165 Type 2 diabetes mellitus with hyperglycemia: Secondary | ICD-10-CM | POA: Diagnosis not present

## 2018-03-24 DIAGNOSIS — E039 Hypothyroidism, unspecified: Secondary | ICD-10-CM | POA: Diagnosis not present

## 2018-03-24 DIAGNOSIS — N181 Chronic kidney disease, stage 1: Secondary | ICD-10-CM | POA: Diagnosis not present

## 2018-03-24 DIAGNOSIS — I129 Hypertensive chronic kidney disease with stage 1 through stage 4 chronic kidney disease, or unspecified chronic kidney disease: Secondary | ICD-10-CM | POA: Diagnosis not present

## 2018-03-24 MED ORDER — MIRTAZAPINE 15 MG PO TABS: 15.0000 mg | ORAL_TABLET | Freq: Every day | ORAL | 0 refills | Status: DC

## 2018-03-24 NOTE — Progress Notes (Signed)
Subjective:    Patient ID: [name], male    DOB: 04-May-1941, 77 y.o.   MRN: [id number]  [name] is a 77 y.o. male presenting on 03/24/2018 for Recurrent Skin Infections (pt states he has a boil on left hip area- painful, has not drained)   HPI   History of well controlled diabetes, CKD 1, PAD, and decreased appetite. Has had area on bottom that is painful and sore for several weeks. Denies fevers, chills, nausea, vomiting. Able to ambulate like normal, which is not much. Patient's son who presents with him reports that he stands up for maybe 10 minutes out of the day and the rest of the time he is just sitting down.  Son also reports his dad has a decreased appetite and is not wanting to eat. Has been on remeron before.  Social History   Tobacco Use  . Smoking status: Former Smoker    Packs/day: 0.25    Types: Cigarettes    Last attempt to quit: 03/23/2015    Years since quitting: 3.0  . Smokeless tobacco: Current User    Types: Chew  Substance Use Topics  . Alcohol use: Yes    Alcohol/week: 25.2 oz    Types: 42 Cans of beer per week    Comment: 5-6 every day  . Drug use: No    Review of Systems Per HPI unless specifically indicated above     Objective:    BP (!) 144/57   Pulse 73   Temp 97.6 F (36.4 C) (Oral)   Wt 163 lb 9.6 oz (74.2 kg)   SpO2 98%   BMI 24.16 kg/m   Wt Readings from Last 3 Encounters:  03/24/18 163 lb 9.6 oz (74.2 kg)  02/24/18 181 lb 3 oz (82.2 kg)  02/13/18 183 lb 8 oz (83.2 kg)    Physical Exam  Constitutional: He is oriented to person, place, and time. He appears well-developed and well-nourished.  Cardiovascular: Normal rate.  Pulmonary/Chest: Effort normal.  Neurological: He is alert and oriented to person, place, and time.  Skin: Skin is warm and dry. There is erythema.  There is a 2 cm area of redness on left buttocks with central erosion. No fluctuance, no purulence.   Psychiatric: He has a normal mood and affect. His  behavior is normal.  Minimally verbal.       Media Information         Document Information   Photos  Left buttock   03/24/2018 09:47  Attached To:  Office Visit on 03/24/18 with Trinna Post, PA-C   Source Information   Trinna Post, PA-C  [facility]       Results for orders placed or performed in visit on 02/24/18  Bayer DCA Hb A1c Waived  Result Value Ref Range   HB A1C (BAYER DCA - WAIVED) 6.0 <7.0 %      Assessment & Plan:   1. Pressure injury of left buttock, stage 2  Needs to see wound clinic, counseled patient and son on progressive nature of pressure ulcers if not addressed. Counseled patient that he needs to offload pressure frequently. Son reports he does not move and doesn't even want to go outside. Told patient if he doesn't want to walk around to at least stand up every hour or so. Will contact his home health.   Called [facility] and put in order for monitoring of pressure ulcer. Patient has Wound Care follow up on 03/31/2018.  -  AMB referral to wound care center  2. Decreased appetite  For appetite, per Dr. Wynetta Emery. He has a follow up with her in one month, can check efficacy then.   - mirtazapine (REMERON) 15 MG tablet; Take 1 tablet (15 mg total) by mouth at bedtime.  Dispense: 90 tablet; Refill: 0  3. Type 2 diabetes, controlled, with neuropathy ([facility])  Stable.    Follow up plan: Return if symptoms worsen or fail to improve, for chronic issues .  Carles Collet, PA-C [facility] 03/24/2018, 11:52 AM

## 2018-03-24 NOTE — Patient Instructions (Signed)
Pressure Injury A pressure injury, sometimes called a bedsore, is an injury to the skin and underlying tissue caused by pressure. Pressure on blood vessels causes decreased blood flow to the skin, which can eventually cause the skin tissue to die and break down into a wound. Pressure injuries usually occur:  Over bony parts of the body such as the tailbone, shoulders, elbows, hips, and heels.  Under medical devices such as respiratory equipment, stockings, tubes, and splints.  Pressure injuries start as reddened areas on the skin and can lead to pain, muscle damage, and infection. Pressure injuries can vary in severity. What are the causes? Pressure injuries are caused by a lack of blood supply to an area of skin. They can occur from intense pressure over a short period of time or from less intense pressure over a long period of time. What increases the risk? This condition is more likely to develop in people who:  Are in the hospital or an extended care facility.  Are bedridden or in a wheelchair.  Have an injury or disease that keeps them from: ? Moving normally. ? Feeling pain or pressure.  Have a condition that: ? Makes them sleepy or less alert. ? Causes poor blood flow.  Need to wear a medical device.  Have poor control of their bladder or bowel functions (incontinence).  Have poor nutrition (malnutrition).  Are of certain ethnicities. People of African American and Latino or Hispanic descent are at higher risk compared to other ethnic groups.  If you are at risk for pressure ulcers, your health care provider may recommend certain types of bedding to help prevent them. These may include foam or gel mattresses covered with one of the following:  A sheepskin blanket.  A pad that is filled with gel, air, water, or foam.  What are the signs or symptoms? The main symptom is a blister or change in skin color that opens into a wound. Other symptoms include:  Red or dark  areas of skin that do not turn white or pale when pressed with a finger.  Pain, warmth, or change of skin texture.  How is this diagnosed? This condition is diagnosed with a medical history and physical exam. You may also have tests, including:  Blood tests to check for infection or signs of poor nutrition.  Imaging studies to check for damage to the deep tissues under your skin.  Blood flow studies.  Your pressure injury will be staged to determine its severity. Staging is an assessment of:  The depth of the pressure injury.  Which tissues are exposed because of the pressure injury.  The causes of the pressure injury.  How is this treated? The main focus of treatment is to help your injury heal. This may be done by:  Relieving or redistributing pressure on your skin. This includes: ? Frequently changing your position. ? Eliminating or minimizing positions that caused the wound or that can make the wound worse. ? Using specific bed mattresses and chair cushions. ? Refitting, resizing, or replacing any medical devices, or padding the skin under them. ? Using creams or powders to prevent rubbing (friction) on the skin.  Keeping your skin clean and dry. This may include using a skin cleanser or skin protectant as told by your health care provider. This may be a lotion, ointment, or spray.  Cleaning your injury and removing any dead tissue from the wound (debridement).  Placing a bandage (dressing) over your injury.  Preventing or treating infection.  This may include antibiotic, antimicrobial, or antiseptic medicines.  Treatment may also include medicine for pain. Sometimes surgery is needed to close the wound with a flap of healthy skin or a piece of skin from another area of your body (graft). You may need surgery if other treatments are not working or if your injury is very deep. Follow these instructions at home: Wound care  Follow instructions from your health care  provider about: ? How to take care of your wound. ? When and how you should change your dressing. ? When you should remove your dressing. If your dressing is dry and stuck when you try to remove it, moisten or wet the dressing with saline or water so that it can be removed without harming your skin or wound tissue.  Check your wound every day for signs of infection. Have a caregiver do this for you if you are not able. Watch for: ? More redness, swelling, or pain. ? More fluid, blood, or pus. ? A bad smell. Skin Care  Keep your skin clean and dry. Gently pat your skin dry.  Do not rub or massage your skin.  Use a skin protectant only as told by your health care provider.  Check your skin every day for any changes in color or any new blisters or sores (ulcers). Have a caregiver do this for you if you are not able. Medicines  Take over-the-counter and prescription medicines only as told by your health care provider.  If you were prescribed an antibiotic medicine, take it or apply it as told by your health care provider. Do not stop taking or using the antibiotic even if your condition improves. Reducing and Redistributing Pressure  Do not lie or sit in one position for a long time. Move or change position every two hours or as told by your health care provider.  Use pillows or cushions to reduce pressure. Ask your health care provider to recommend cushions or pads for you.  Use medical devices that do not rub your skin. Tell your health care provider if one of your medical devices is causing a pressure injury to develop. General instructions   Eat a healthy diet that includes lots of protein. Ask your health care provider for diet advice.  Drink enough fluid to keep your urine clear or pale yellow.  Be as active as you can every day. Ask your health care provider to suggest safe exercises or activities.  Do not abuse drugs or alcohol.  Keep all follow-up visits as told by your  health care provider. This is important.  Do not smoke. Contact a health care provider if:   You have chills or fever.  Your pain medicine is not helping.  You have any changes in skin color.  You have new blisters or sores.  You develop warmth, redness, or swelling near a pressure injury.  You have a bad odor or pus coming from your pressure injury.  You lose control of your bowels or bladder.  You develop new symptoms.  Your wound does not improve after 1-2 weeks of treatment.  You develop a new medical condition, such as diabetes, peripheral vascular disease, or conditions that affect your defense (immune) system. This information is not intended to replace advice given to you by your health care provider. Make sure you discuss any questions you have with your health care provider. Document Released: 09/27/2005 Document Revised: 03/01/2016 Document Reviewed: 02/05/2015 Elsevier Interactive Patient Education  2018 [employer]  

## 2018-03-27 ENCOUNTER — Other Ambulatory Visit: Payer: Self-pay | Admitting: Radiology

## 2018-03-28 ENCOUNTER — Other Ambulatory Visit (HOSPITAL_COMMUNITY)
Admission: RE | Admit: 2018-03-28 | Disposition: A | Discharge status: Home / Self Care | Payer: Medicare Other | Source: Ambulatory Visit | Attending: Oncology | Admitting: Oncology

## 2018-03-28 ENCOUNTER — Ambulatory Visit
Admission: RE | Admit: 2018-03-28 | Discharge: 2018-03-28 | Disposition: A | Discharge status: Home / Self Care | Payer: Medicare Other | Source: Ambulatory Visit | Attending: Oncology | Admitting: Oncology

## 2018-03-28 DIAGNOSIS — Z87891 Personal history of nicotine dependence: Secondary | ICD-10-CM | POA: Diagnosis not present

## 2018-03-28 DIAGNOSIS — C9000 Multiple myeloma not having achieved remission: Secondary | ICD-10-CM | POA: Insufficient documentation

## 2018-03-28 DIAGNOSIS — E1140 Type 2 diabetes mellitus with diabetic neuropathy, unspecified: Secondary | ICD-10-CM | POA: Diagnosis not present

## 2018-03-28 DIAGNOSIS — E1165 Type 2 diabetes mellitus with hyperglycemia: Secondary | ICD-10-CM | POA: Diagnosis not present

## 2018-03-28 DIAGNOSIS — E039 Hypothyroidism, unspecified: Secondary | ICD-10-CM | POA: Insufficient documentation

## 2018-03-28 DIAGNOSIS — Z79890 Hormone replacement therapy (postmenopausal): Secondary | ICD-10-CM | POA: Diagnosis not present

## 2018-03-28 DIAGNOSIS — E1151 Type 2 diabetes mellitus with diabetic peripheral angiopathy without gangrene: Secondary | ICD-10-CM | POA: Diagnosis not present

## 2018-03-28 DIAGNOSIS — E538 Deficiency of other specified B group vitamins: Secondary | ICD-10-CM | POA: Diagnosis not present

## 2018-03-28 DIAGNOSIS — D61818 Other pancytopenia: Secondary | ICD-10-CM | POA: Diagnosis not present

## 2018-03-28 DIAGNOSIS — Z79899 Other long term (current) drug therapy: Secondary | ICD-10-CM | POA: Diagnosis not present

## 2018-03-28 DIAGNOSIS — I10 Essential (primary) hypertension: Secondary | ICD-10-CM | POA: Insufficient documentation

## 2018-03-28 DIAGNOSIS — I129 Hypertensive chronic kidney disease with stage 1 through stage 4 chronic kidney disease, or unspecified chronic kidney disease: Secondary | ICD-10-CM | POA: Diagnosis not present

## 2018-03-28 DIAGNOSIS — E1122 Type 2 diabetes mellitus with diabetic chronic kidney disease: Secondary | ICD-10-CM | POA: Diagnosis not present

## 2018-03-28 DIAGNOSIS — N181 Chronic kidney disease, stage 1: Secondary | ICD-10-CM | POA: Diagnosis not present

## 2018-03-28 HISTORY — DX: Malignant (primary) neoplasm, unspecified: C80.1

## 2018-03-28 LAB — CBC WITH DIFFERENTIAL/PLATELET
Basophils Absolute: 0 10*3/uL (ref 0–0.1)
Basophils Relative: 1 %
EOS ABS: 0.1 10*3/uL (ref 0–0.7)
EOS PCT: 3 %
HCT: 26.6 % — ABNORMAL LOW (ref 40.0–52.0)
HEMOGLOBIN: 9.7 g/dL — AB (ref 13.0–18.0)
LYMPHS ABS: 0.3 10*3/uL — AB (ref 1.0–3.6)
Lymphocytes Relative: 13 %
MCH: 41.8 pg — ABNORMAL HIGH (ref 26.0–34.0)
MCHC: 36.5 g/dL — ABNORMAL HIGH (ref 32.0–36.0)
MCV: 112.4 fL — ABNORMAL HIGH (ref 80.0–100.0)
Monocytes Absolute: 0.5 10*3/uL (ref 0.2–1.0)
Monocytes Relative: 20 %
NEUTROS PCT: 63 %
Neutro Abs: 1.5 10*3/uL (ref 1.4–6.5)
PLATELETS: 65 10*3/uL — AB (ref 150–440)
RBC: 2.37 MIL/uL — AB (ref 4.40–5.90)
RDW: 14 % (ref 11.5–14.5)
WBC: 2.4 10*3/uL — AB (ref 3.8–10.6)

## 2018-03-28 LAB — PROTIME-INR
INR: 1.14
Prothrombin Time: 14.5 seconds (ref 11.4–15.2)

## 2018-03-28 LAB — GLUCOSE, CAPILLARY: Glucose-Capillary: 127 mg/dL — ABNORMAL HIGH (ref 65–99)

## 2018-03-28 MED ORDER — HEPARIN SOD (PORK) LOCK FLUSH 100 UNIT/ML IV SOLN
INTRAVENOUS | Status: AC
  Filled 2018-03-28: qty 5

## 2018-03-28 MED ORDER — SODIUM CHLORIDE 0.9 % IV SOLN
INTRAVENOUS | Status: DC
  Administered 2018-03-28: 09:00:00 via INTRAVENOUS

## 2018-03-28 MED ORDER — MIDAZOLAM HCL 2 MG/2ML IJ SOLN
INTRAMUSCULAR | Status: AC | PRN
  Administered 2018-03-28: 1 mg via INTRAVENOUS

## 2018-03-28 MED ORDER — MIDAZOLAM HCL 2 MG/2ML IJ SOLN
INTRAMUSCULAR | Status: AC
  Filled 2018-03-28: qty 4

## 2018-03-28 MED ORDER — FENTANYL CITRATE (PF) 100 MCG/2ML IJ SOLN
INTRAMUSCULAR | Status: AC
  Filled 2018-03-28: qty 2

## 2018-03-28 MED ORDER — FENTANYL CITRATE (PF) 100 MCG/2ML IJ SOLN
INTRAMUSCULAR | Status: AC | PRN
  Administered 2018-03-28: 50 ug via INTRAVENOUS

## 2018-03-28 MED ORDER — HYDROCODONE-ACETAMINOPHEN 5-325 MG PO TABS
1.0000 | ORAL_TABLET | ORAL | Status: DC | PRN
  Filled 2018-03-28: qty 2

## 2018-03-28 MED ORDER — LIDOCAINE HCL 1 % IJ SOLN
INTRAMUSCULAR | Status: AC | PRN
  Administered 2018-03-28: 10 mL via INTRADERMAL

## 2018-03-28 NOTE — H&P (Signed)
Chief Complaint: Patient was seen in consultation today for multiple myeloma  Referring Physician(s): Finnegan,Timothy J  Supervising Physician: Markus Daft  Patient Status: [facility] - Out-pt  History of Present Illness: [name] is a 77 y.o. male with past medical history of arthritis, DM, HTN, PVD, EtOH use, and multiple myeloma who presents for bone marrow biopsy for treatment planning.  Patient presents today in his usual state of health.  He is sedentary at baseline per his son and has developed a painful pressure ulcer which has been assessed by his PCP.  He has been NPO.  He does not take blood thinners.   Past Medical History:  Diagnosis Date  . Arthritis   . Diabetes mellitus   . Dyspnea   . 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 Hypertension   . Hypothyroidism   . Neuropathy   . Peripheral vascular disease ([place])   . Venous stasis     Past Surgical History:  Procedure Laterality Date  . CATARACT EXTRACTION W/PHACO Right 10/25/2016   Procedure: CATARACT EXTRACTION PHACO AND INTRAOCULAR LENS PLACEMENT (IOC)  right eye complicated;  Surgeon: Ronnell Freshwater, MD;  Location: [facility];  Service: Ophthalmology;  Laterality: Right;  Right eye Diabetic - oral meds Vision blue  . FINGER SURGERY      Allergies: Metformin and related  Medications: Prior to Admission medications   Medication Sig Start Date End Date Taking? Authorizing Provider  gabapentin (NEURONTIN) 300 MG capsule Take 2 capsules (600 mg total) by mouth 3 (three) times daily. 02/13/18  Yes Lloyd Huger, MD  levothyroxine (SYNTHROID, LEVOTHROID) 75 MCG tablet Take 1 tablet (75 mcg total) daily before breakfast by mouth. 08/16/17  Yes Johnson, Megan P, DO  acyclovir (ZOVIRAX) 400 MG tablet Take 1 tablet (400 mg total) by mouth 2 (two) times daily. Patient not taking: Reported on 02/24/2018 10/14/17   Lloyd Huger, MD  amitriptyline  (ELAVIL) 25 MG tablet Take 25 mg by mouth at bedtime.    [provider]  dexamethasone (DECADRON) 4 MG tablet Take 5 tablets (20 mg total) by mouth once a week. Take 20 mg on Monday of each week. Patient not taking: Reported on 03/28/2018 12/20/17   Lloyd Huger, MD  folic acid (FOLVITE) 1 MG tablet Take 1 tablet (1 mg total) by mouth daily. Patient not taking: Reported on 03/28/2018 10/24/17   Lloyd Huger, MD  mirtazapine (REMERON) 15 MG tablet Take 1 tablet (15 mg total) by mouth at bedtime. 03/24/18 06/22/18  Trinna Post, PA-C  Multiple Vitamin (MULTIVITAMIN) tablet Take 1 tablet by mouth daily.    [provider]  ondansetron (ZOFRAN) 8 MG tablet  10/14/17   [provider]  prochlorperazine (COMPAZINE) 10 MG tablet Take 1 tablet (10 mg total) by mouth every 6 (six) hours as needed (Nausea or vomiting). Patient not taking: Reported on 02/24/2018 10/14/17   Lloyd Huger, MD  thiamine (VITAMIN B-1) 100 MG tablet Take 1 tablet (100 mg total) by mouth daily. Patient not taking: Reported on 02/24/2018 10/24/17   Lloyd Huger, MD     Family History  Problem Relation Age of Onset  . Heart disease Mother   . Alzheimer's disease Sister   . Heart disease Maternal Aunt     Social History   Socioeconomic History  . Marital status: Married    Spouse name: Not on file  . Number  of children: 2  . Years of education: Not on file  . Highest education level: Not on file  Occupational History  . Occupation: Custodian  Social Needs  . Financial resource strain: Not on file  . Food insecurity:    Worry: Not on file    Inability: Not on file  . Transportation needs:    Medical: Not on file    Non-medical: Not on file  Tobacco Use  . Smoking status: Former Smoker    Packs/day: 0.25    Types: Cigarettes    Last attempt to quit: 03/23/2015    Years since quitting: 3.0  . Smokeless tobacco: Current User    Types: Chew  Substance and Sexual  Activity  . Alcohol use: Yes    Alcohol/week: 25.2 oz    Types: 42 Cans of beer per week    Comment: 5-6 every day  . Drug use: No  . Sexual activity: Not on file    Comment: currently trying to quit now  Lifestyle  . Physical activity:    Days per week: Not on file    Minutes per session: Not on file  . Stress: Not on file  Relationships  . Social connections:    Talks on phone: Not on file    Gets together: Not on file    Attends religious service: Not on file    Active member of club or organization: Not on file    Attends meetings of clubs or organizations: Not on file    Relationship status: Not on file  Other Topics Concern  . Not on file  Social History Narrative   Lives at home with son, ambulates with a cane     Review of Systems: A 12 point ROS discussed and pertinent positives are indicated in the HPI above.  All other systems are negative.  Review of Systems  Constitutional: Negative for fatigue and fever.  Respiratory: Negative for cough and shortness of breath.   Cardiovascular: Negative for chest pain.  Gastrointestinal: Negative for abdominal pain.  Musculoskeletal: Negative for back pain.  Skin: Positive for wound (sacrum).  Psychiatric/Behavioral: Negative for behavioral problems and confusion.    Vital Signs: BP (!) 131/44   Pulse 78   Temp 97.9 F (36.6 C) (Oral)   Resp 17   Ht '5\' 10"'  (1.778 m) Comment: pt doesnt know height   SpO2 97%   BMI 23.47 kg/m   Physical Exam  Constitutional: He is oriented to person, place, and time. He appears well-developed.  Cardiovascular: Normal rate, regular rhythm and normal heart sounds.  Pulmonary/Chest: Effort normal and breath sounds normal. No respiratory distress.  Abdominal: Soft. Bowel sounds are normal. He exhibits no distension.  Musculoskeletal: Normal range of motion.  Neurological: He is alert and oriented to person, place, and time.  Skin: Skin is warm and dry.  Psychiatric: He has a normal  mood and affect. His behavior is normal. Judgment and thought content normal.  Nursing note and vitals reviewed.    MD Evaluation Airway: WNL Heart: WNL Abdomen: WNL Chest/ Lungs: WNL ASA  Classification: 3 Mallampati/Airway Score: One   Imaging: No results found.  Labs:  CBC: Recent Labs    01/23/18 1026 01/30/18 1330 02/13/18 0932 02/20/18 1436  WBC 2.1* 2.8* 3.7* 2.9*  HGB 9.6* 10.4* 8.2* 8.8*  HCT 26.5* 28.4* 22.2* 23.9*  PLT 75* 51* 58* 49*    COAGS: Recent Labs    08/09/17 1000  INR 1.16  BMP: Recent Labs    01/23/18 1026 01/30/18 1330 02/13/18 0932 02/20/18 1436  NA 133* 124* 124* 126*  K 4.4 4.6 4.4 4.5  CL 100* 91* 93* 93*  CO2 25 23 20* 22  GLUCOSE 174* 294* 148* 163*  BUN '8 13 9 9  ' CALCIUM 9.0 8.5* 8.9 8.7*  CREATININE 0.63 0.59* 0.48* 0.54*  GFRNONAA >60 >60 >60 >60  GFRAA >60 >60 >60 >60    LIVER FUNCTION TESTS: Recent Labs    01/23/18 1026 01/30/18 1330 02/13/18 0932 02/20/18 1436  BILITOT 1.3* 1.5* 1.8* 1.7*  AST 30 39 35 32  ALT '19 24 24 23  ' ALKPHOS 109 95 109 97  PROT 6.2* 5.8* 6.2* 5.9*  ALBUMIN 3.8 3.7 4.0 3.9    TUMOR MARKERS: No results for input(s): AFPTM, CEA, CA199, CHROMGRNA in the last 8760 hours.  Assessment and Plan: Patient with past medical history of multiple myeloma presents for restaging bone marrow biopsy to assist in treatment planning.  IR consulted at the request of Dr. Grayland Ormond. Patient presents today in their usual state of health.  He has been NPO and is not currently on blood thinners.   Risks and benefits discussed with the patient including, but not limited to bleeding, infection, damage to adjacent structures or low yield requiring additional tests.  All of the patient's questions were answered, patient is agreeable to proceed. Consent signed and in chart.  Thank you for this interesting consult.  I greatly enjoyed meeting [name] and look forward to participating in their care.   A copy of this report was sent to the requesting provider on this date.  Electronically Signed: Docia Barrier, PA 03/28/2018, 8:40 AM   I spent a total of    15 Minutes in face to face in clinical consultation, greater than 50% of which was counseling/coordinating care for multiple myeloma.

## 2018-03-28 NOTE — Discharge Instructions (Signed)
Bone Marrow Aspiration and Bone Marrow Biopsy, Adult Bone marrow aspiration and bone marrow biopsy are procedures that are done to diagnose blood disorders. You may also have one of these procedures to help diagnose infections or some types of cancer. Bone marrow is the soft tissue that is inside your bones. Blood cells are produced in bone marrow. For bone marrow aspiration, a sample of tissue in liquid form is removed from inside your bone. For a bone marrow biopsy, a small core of bone marrow tissue is removed. These samples are examined under a microscope or tested in a lab. You may need these procedures if you have an abnormal complete blood count (CBC). The aspiration or biopsy sample is usually taken from the top of your hip bone. Sometimes, an aspiration sample is taken from your chest bone (sternum). Tell a health care provider about:  Any allergies you have.  All medicines you are taking, including vitamins, herbs, eye drops, creams, and over-the-counter medicines.  Any problems you or family members have had with anesthetic medicines.  Any blood or bone disorders you have.  Any surgeries you have had.  Any medical conditions you have.  Whether you are pregnant or you think that you may be pregnant. What are the risks? Generally, this is a safe procedure. However, problems may occur, including:  Infection.  Bleeding.  Persistent pain after the procedure.  Cracking (fracture) of the bone.  Allergic reactions to medicines.  What happens before the procedure? Staying hydrated Follow instructions from your health care provider about hydration, which may include:  Up to 2 hours before the procedure - you may continue to drink clear liquids, such as water, clear fruit juice, black coffee, and plain tea.  Eating and drinking restrictions Follow instructions from your health care provider about eating and drinking, which may include:  8 hours before the procedure - stop  eating heavy meals or foods such as meat, fried foods, or fatty foods.  6 hours before the procedure - stop eating light meals or foods, such as toast or cereal.  6 hours before the procedure - stop drinking milk or drinks that contain milk.  2 hours before the procedure - stop drinking clear liquids.  Medicines  Ask your health care provider about: ? Changing or stopping your regular medicines. This is especially important if you are taking diabetes medicines or blood thinners. ? Taking medicines such as aspirin and ibuprofen. These medicines can thin your blood. Do not take these medicines before your procedure if your health care provider instructs you not to.  You may be given antibiotic medicine to help prevent infection. General instructions  Plan to have someone take you home after the procedure.  If you will be going home right after the procedure, plan to have someone with you for 24 hours.  Ask your health care provider how your surgical site will be marked or identified. What happens during the procedure?  To reduce your risk of infection: ? Your health care team will wash or sanitize their hands. ? Your skin will be washed with soap. ? Hair may be removed from the surgical area.  An IV tube may be inserted into one of your veins.  The injection site will be cleaned with a germ-killing solution (antiseptic).  You will be given one or more of the following: ? A medicine to help you relax (sedative). ? A medicine to numb the area (local anesthetic). ? A medicine to make you fall asleep (  general anesthetic).  The bone marrow sample will be removed as follows: ? For an aspiration, a hollow needle will be inserted through your skin and into your bone. Bone marrow fluid will be drawn up into a syringe. ? For a biopsy, your health care provider will use a hollow needle to remove a core of tissue from your bone marrow.  The needle will be removed.  A bandage (dressing)  will be placed over the insertion site and taped in place. The procedure may vary among health care providers and hospitals.   Bone Marrow Aspiration and Bone Marrow Biopsy, Adult, Care After This sheet gives you information about how to care for yourself after your procedure. Your health care provider may also give you more specific instructions. If you have problems or questions, contact your health care provider. What can I expect after the procedure? After the procedure, it is common to have:  Mild pain and tenderness.  Swelling.  Bruising.  Follow these instructions at home:  Take over-the-counter or prescription medicines only as told by your health care provider.  Do not take baths, swim, or use a hot tub until your health care provider approves. Ask if you can take a shower or have a sponge bath.  Follow instructions from your health care provider about how to take care of the puncture site. Make sure you: ? Wash your hands with soap and water before you change your bandage (dressing). If soap and water are not available, use hand sanitizer. ? Change your dressing as told by your health care provider.  Check your puncture siteevery day for signs of infection. Check for: ? More redness, swelling, or pain. ? More fluid or blood. ? Warmth. ? Pus or a bad smell.  Return to your normal activities as told by your health care provider. Ask your health care provider what activities are safe for you.  Do not drive for 24 hours if you were given a medicine to help you relax (sedative).  Keep all follow-up visits as told by your health care provider. This is important. Contact a health care provider if:  You have more redness, swelling, or pain around the puncture site.  You have more fluid or blood coming from the puncture site.  Your puncture site feels warm to the touch.  You have pus or a bad smell coming from the puncture site.  You have a fever.  Your pain is not  controlled with medicine. This information is not intended to replace advice given to you by your health care provider. Make sure you discuss any questions you have with your health care provider. Document Released: 04/16/2005 Document Revised: 04/16/2016 Document Reviewed: 03/10/2016 Elsevier Interactive Patient Education  2018 [employer].

## 2018-03-28 NOTE — Procedures (Signed)
CT guided bone marrow biopsy. 2 aspirates and 2 cores. Minimal blood loss and no immediate complication.   

## 2018-03-29 DIAGNOSIS — E538 Deficiency of other specified B group vitamins: Secondary | ICD-10-CM | POA: Diagnosis not present

## 2018-03-29 DIAGNOSIS — C9000 Multiple myeloma not having achieved remission: Secondary | ICD-10-CM | POA: Diagnosis not present

## 2018-03-29 DIAGNOSIS — E1122 Type 2 diabetes mellitus with diabetic chronic kidney disease: Secondary | ICD-10-CM | POA: Diagnosis not present

## 2018-03-29 DIAGNOSIS — I129 Hypertensive chronic kidney disease with stage 1 through stage 4 chronic kidney disease, or unspecified chronic kidney disease: Secondary | ICD-10-CM | POA: Diagnosis not present

## 2018-03-29 DIAGNOSIS — E1165 Type 2 diabetes mellitus with hyperglycemia: Secondary | ICD-10-CM | POA: Diagnosis not present

## 2018-03-29 DIAGNOSIS — N181 Chronic kidney disease, stage 1: Secondary | ICD-10-CM | POA: Diagnosis not present

## 2018-03-29 DIAGNOSIS — E039 Hypothyroidism, unspecified: Secondary | ICD-10-CM | POA: Diagnosis not present

## 2018-03-29 DIAGNOSIS — E1140 Type 2 diabetes mellitus with diabetic neuropathy, unspecified: Secondary | ICD-10-CM | POA: Diagnosis not present

## 2018-03-31 ENCOUNTER — Encounter: Payer: Medicare Other | Attending: Physician Assistant | Admitting: Physician Assistant

## 2018-03-31 DIAGNOSIS — E039 Hypothyroidism, unspecified: Secondary | ICD-10-CM | POA: Insufficient documentation

## 2018-03-31 DIAGNOSIS — N181 Chronic kidney disease, stage 1: Secondary | ICD-10-CM | POA: Diagnosis not present

## 2018-03-31 DIAGNOSIS — I129 Hypertensive chronic kidney disease with stage 1 through stage 4 chronic kidney disease, or unspecified chronic kidney disease: Secondary | ICD-10-CM | POA: Diagnosis not present

## 2018-03-31 DIAGNOSIS — E1151 Type 2 diabetes mellitus with diabetic peripheral angiopathy without gangrene: Secondary | ICD-10-CM | POA: Diagnosis not present

## 2018-03-31 DIAGNOSIS — I890 Lymphedema, not elsewhere classified: Secondary | ICD-10-CM | POA: Diagnosis not present

## 2018-03-31 DIAGNOSIS — L89322 Pressure ulcer of left buttock, stage 2: Secondary | ICD-10-CM | POA: Insufficient documentation

## 2018-03-31 DIAGNOSIS — I10 Essential (primary) hypertension: Secondary | ICD-10-CM | POA: Insufficient documentation

## 2018-03-31 DIAGNOSIS — C9000 Multiple myeloma not having achieved remission: Secondary | ICD-10-CM | POA: Insufficient documentation

## 2018-03-31 DIAGNOSIS — Z87891 Personal history of nicotine dependence: Secondary | ICD-10-CM | POA: Diagnosis not present

## 2018-03-31 DIAGNOSIS — E1165 Type 2 diabetes mellitus with hyperglycemia: Secondary | ICD-10-CM | POA: Diagnosis not present

## 2018-03-31 DIAGNOSIS — E1140 Type 2 diabetes mellitus with diabetic neuropathy, unspecified: Secondary | ICD-10-CM | POA: Diagnosis not present

## 2018-03-31 DIAGNOSIS — E538 Deficiency of other specified B group vitamins: Secondary | ICD-10-CM | POA: Diagnosis not present

## 2018-03-31 DIAGNOSIS — E1122 Type 2 diabetes mellitus with diabetic chronic kidney disease: Secondary | ICD-10-CM | POA: Diagnosis not present

## 2018-04-02 NOTE — Progress Notes (Signed)
GARNER, DULLEA ([id number]) Visit Report for 03/31/2018 Abuse/Suicide Risk Screen Details Patient Name: [name], [name] Date of Service: 03/31/2018 8:00 AM Medical Record Number: [id number] Patient Account Number: [id number] Date of Birth/Sex: [birth year] (77 y.o. M) Treating RN: Roger Shelter Primary Care Tambi Thole: Park Liter Other Clinician: Referring Amylee Lodato: Carles Collet Treating Arrie Zuercher/Extender: Melburn Hake, HOYT Weeks in Treatment: 0 Abuse/Suicide Risk Screen Items Answer ABUSE/SUICIDE RISK SCREEN: Has anyone close to you tried to hurt or harm you recentlyo No Do you feel uncomfortable with anyone in your familyo No Has anyone forced you do things that you didnot want to doo No Do you have any thoughts of harming yourselfo No Patient displays signs or symptoms of abuse and/or neglect. No Electronic Signature(s) Signed: 03/31/2018 4:42:38 PM By: Roger Shelter Entered By: Roger Shelter on 03/31/2018 08:41:28 Kupfer, [place]. ([id number]) -------------------------------------------------------------------------------- Activities of Daily Living Details Patient Name: [name], [name] Date of Service: 03/31/2018 8:00 AM Medical Record Number: [id number] Patient Account Number: [id number] Date of Birth/Sex: 02/05/1941 (77 y.o. M) Treating RN: Roger Shelter Primary Care Jahlisa Rossitto: Park Liter Other Clinician: Referring Zael Shuman: Carles Collet Treating Yoshiye Kraft/Extender: Melburn Hake, HOYT Weeks in Treatment: 0 Activities of Daily Living Items Answer Activities of Daily Living (Please select one for each item) Drive Automobile Not Able Take Medications Need Assistance Use Telephone Need Assistance Care for Appearance Need Assistance Use Toilet Need Assistance Bath / Shower Need Assistance Dress Self Need Assistance Feed Self Need Assistance Walk Need Assistance Get In / Out Bed Need Assistance Housework Need Assistance Prepare Meals Need Assistance Handle  Money Need Assistance Shop for Self Need Assistance Electronic Signature(s) Signed: 03/31/2018 4:42:38 PM By: Roger Shelter Entered By: Roger Shelter on 03/31/2018 08:41:57 Persichetti, Donnie Coffin ([id number]) -------------------------------------------------------------------------------- Education Assessment Details Patient Name: Martin Maxon. Date of Service: 03/31/2018 8:00 AM Medical Record Number: [id number] Patient Account Number: [id number] Date of Birth/Sex: 07-Aug-1941 (77 y.o. M) Treating RN: Roger Shelter Primary Care Jayme Mednick: Park Liter Other Clinician: Referring Cortasia Screws: Carles Collet Treating Aleshka Corney/Extender: Melburn Hake, HOYT Weeks in Treatment: 0 Primary Learner Assessed: Patient Learning Preferences/Education Level/Primary Language Learning Preference: Explanation Preferred Language: English Cognitive Barrier Assessment/Beliefs Language Barrier: No Translator Needed: No Memory Deficit: No Emotional Barrier: No Cultural/Religious Beliefs Affecting Medical Care: No Physical Barrier Assessment Impaired Vision: No Impaired Hearing: No Decreased Hand dexterity: No Knowledge/Comprehension Assessment Knowledge Level: Medium Comprehension Level: Medium Ability to understand written Medium instructions: Ability to understand verbal Medium instructions: Motivation Assessment Anxiety Level: Calm Cooperation: Cooperative Education Importance: Acknowledges Need Interest in Health Problems: Asks Questions Perception: Coherent Willingness to Engage in Self- High Management Activities: Readiness to Engage in Self- High Management Activities: Electronic Signature(s) Signed: 03/31/2018 4:42:38 PM By: Roger Shelter Entered By: Roger Shelter on 03/31/2018 08:42:43 Lepp, Donnie Coffin ([id number]) -------------------------------------------------------------------------------- Fall Risk Assessment Details Patient Name: Martin Maxon. Date of Service:  03/31/2018 8:00 AM Medical Record Number: [id number] Patient Account Number: [id number] Date of Birth/Sex: 1941/03/31 (77 y.o. M) Treating RN: Roger Shelter Primary Care Angelyse Heslin: Park Liter Other Clinician: Referring Stokes Rattigan: Carles Collet Treating Kennah Hehr/Extender: Melburn Hake, HOYT Weeks in Treatment: 0 Fall Risk Assessment Items Have you had 2 or more falls in the last 12 monthso 0 Yes Have you had any fall that resulted in injury in the last 12 monthso 0 Yes FALL RISK ASSESSMENT: History of falling - immediate or within 3 months 25 Yes Secondary diagnosis 0 No Ambulatory aid None/bed rest/wheelchair/nurse 0 No Crutches/cane/walker 15 Yes Furniture 0 No IV Access/Saline Lock 0 No  Gait/Training Normal/bed rest/immobile 0 No Weak 10 Yes Impaired 0 No Mental Status Oriented to own ability 0 No Electronic Signature(s) Signed: 03/31/2018 4:42:38 PM By: Roger Shelter Entered By: Roger Shelter on 03/31/2018 08:43:41 Tennison, Raife Loletha Grayer ([id number]) -------------------------------------------------------------------------------- Nutrition Risk Assessment Details Patient Name: [name], [name] Date of Service: 03/31/2018 8:00 AM Medical Record Number: [id number] Patient Account Number: [id number] Date of Birth/Sex: 1941/05/30 (77 y.o. M) Treating RN: Roger Shelter Primary Care Olisa Quesnel: Park Liter Other Clinician: Referring Maribeth Jiles: Carles Collet Treating Abdulkadir Emmanuel/Extender: Melburn Hake, HOYT Weeks in Treatment: 0 Height (in): 67 Weight (lbs): 158 Body Mass Index (BMI): 24.7 Nutrition Risk Assessment Items NUTRITION RISK SCREEN: I have an illness or condition that made me change the kind and/or amount of 0 No food I eat I eat fewer than two meals per day 3 Yes I eat few fruits and vegetables, or milk products 2 Yes I have three or more drinks of beer, liquor or wine almost every day 2 Yes I have tooth or mouth problems that make it hard for me to eat 0 No I  don't always have enough money to buy the food I need 0 No I eat alone most of the time 0 No I take three or more different prescribed or over-the-counter drugs a day 0 No Without wanting to, I have lost or gained 10 pounds in the last six months 2 Yes I am not always physically able to shop, cook and/or feed myself 0 No Nutrition Protocols Good Risk Protocol Moderate Risk Protocol Provide education on elevated blood sugars and High Risk Proctocol 0 impact on wound healing, as Electronic Signature(s) Signed: 03/31/2018 4:42:38 PM By: Roger Shelter Entered By: Roger Shelter on 03/31/2018 08:44:48

## 2018-04-03 NOTE — Progress Notes (Signed)
NANDAN, WILLEMS ([id number]) Visit Report for 03/31/2018 Allergy List Details Patient Name: [name], [name] Date of Service: 03/31/2018 8:00 AM Medical Record Number: [id number] Patient Account Number: [id number] Date of Birth/Sex: 03-Oct-1941 (77 y.o. M) Treating RN: Roger Shelter Primary Care Devinne Epstein: Park Liter Other Clinician: Referring Phenix Vandermeulen: Carles Collet Treating Durell Lofaso/Extender: STONE III, HOYT Weeks in Treatment: 0 Allergies Active Allergies No Known Drug Allergies Allergy Notes Electronic Signature(s) Signed: 03/31/2018 4:42:38 PM By: Roger Shelter Entered By: Roger Shelter on 03/31/2018 08:35:55 Pequignot, Donnie Coffin ([id number]) -------------------------------------------------------------------------------- Arrival Information Details Patient Name: [name] Date of Service: 03/31/2018 8:00 AM Medical Record Number: [id number] Patient Account Number: [id number] Date of Birth/Sex: December 23, 1940 (77 y.o. M) Treating RN: Roger Shelter Primary Care Disaya Walt: Park Liter Other Clinician: Referring Nasim Garofano: Carles Collet Treating Batool Majid/Extender: Melburn Hake, HOYT Weeks in Treatment: 0 Visit Information Patient Arrived: Walker Arrival Time: 08:33 Accompanied By: son Transfer Assistance: None Patient Identification Verified: Yes Secondary Verification Process Completed: Yes History Since Last Visit Electronic Signature(s) Signed: 03/31/2018 4:42:38 PM By: Roger Shelter Entered By: Roger Shelter on 03/31/2018 08:33:59 Coonradt, Damani Loletha Grayer ([id number]) -------------------------------------------------------------------------------- Clinic Level of Care Assessment Details Patient Name: Budlong, Shyam C. Date of Service: 03/31/2018 8:00 AM Medical Record Number: [id number] Patient Account Number: [id number] Date of Birth/Sex: 09-06-1941 (77 y.o. M) Treating RN: Montey Hora Primary Care Deneka Greenwalt: Park Liter Other Clinician: Referring Oshua Mcconaha:  Carles Collet Treating Shelia Magallon/Extender: Melburn Hake, HOYT Weeks in Treatment: 0 Clinic Level of Care Assessment Items TOOL 2 Quantity Score []  - Use when only an EandM is performed on the INITIAL visit 0 ASSESSMENTS - Nursing Assessment / Reassessment X - General Physical Exam (combine w/ comprehensive assessment (listed just below) when 1 20 performed on new pt. evals) X- 1 25 Comprehensive Assessment (HX, ROS, Risk Assessments, Wounds Hx, etc.) ASSESSMENTS - Wound and Skin Assessment / Reassessment X - Simple Wound Assessment / Reassessment - one wound 1 5 []  - 0 Complex Wound Assessment / Reassessment - multiple wounds []  - 0 Dermatologic / Skin Assessment (not related to wound area) ASSESSMENTS - Ostomy and/or Continence Assessment and Care []  - Incontinence Assessment and Management 0 []  - 0 Ostomy Care Assessment and Management (repouching, etc.) PROCESS - Coordination of Care X - Simple Patient / Family Education for ongoing care 1 15 []  - 0 Complex (extensive) Patient / Family Education for ongoing care X- 1 10 Staff obtains Programmer, systems, Records, Test Results / Process Orders []  - 0 Staff telephones HHA, Nursing Homes / Clarify orders / etc []  - 0 Routine Transfer to another Facility (non-emergent condition) []  - 0 Routine Hospital Admission (non-emergent condition) X- 1 15 New Admissions / Biomedical engineer / Ordering NPWT, Apligraf, etc. []  - 0 Emergency Hospital Admission (emergent condition) X- 1 10 Simple Discharge Coordination []  - 0 Complex (extensive) Discharge Coordination PROCESS - Special Needs []  - Pediatric / Minor Patient Management 0 []  - 0 Isolation Patient Management Denny, Grainger C. ([id number]) []  - 0 Hearing / Language / Visual special needs []  - 0 Assessment of Community assistance (transportation, D/C planning, etc.) []  - 0 Additional assistance / Altered mentation X- 1 15 Support Surface(s) Assessment (bed, cushion, seat,  etc.) INTERVENTIONS - Wound Cleansing / Measurement X - Wound Imaging (photographs - any number of wounds) 1 5 []  - 0 Wound Tracing (instead of photographs) X- 1 5 Simple Wound Measurement - one wound []  - 0 Complex Wound Measurement - multiple wounds X- 1 5 Simple Wound Cleansing - one wound []  -  0 Complex Wound Cleansing - multiple wounds INTERVENTIONS - Wound Dressings X - Small Wound Dressing one or multiple wounds 1 10 []  - 0 Medium Wound Dressing one or multiple wounds []  - 0 Large Wound Dressing one or multiple wounds []  - 0 Application of Medications - injection INTERVENTIONS - Miscellaneous []  - External ear exam 0 []  - 0 Specimen Collection (cultures, biopsies, blood, body fluids, etc.) []  - 0 Specimen(s) / Culture(s) sent or taken to Lab for analysis []  - 0 Patient Transfer (multiple staff / Civil Service fast streamer / Similar devices) []  - 0 Simple Staple / Suture removal (25 or less) []  - 0 Complex Staple / Suture removal (26 or more) []  - 0 Hypo / Hyperglycemic Management (close monitor of Blood Glucose) X- 1 15 Ankle / Brachial Index (ABI) - do not check if billed separately Has the patient been seen at the hospital within the last three years: Yes Total Score: 155 Level Of Care: New/Established - Level 4 Electronic Signature(s) Signed: 03/31/2018 4:49:47 PM By: Montey Hora Entered By: Montey Hora on 03/31/2018 09:16:24 Pechacek, Donnie Coffin ([id number]) -------------------------------------------------------------------------------- Lower Extremity Assessment Details Patient Name: [name], [name] Date of Service: 03/31/2018 8:00 AM Medical Record Number: [id number] Patient Account Number: [id number] Date of Birth/Sex: Jul 27, 1941 (77 y.o. M) Treating RN: Roger Shelter Primary Care Taevin Mcferran: Park Liter Other Clinician: Referring Natoshia Souter: Carles Collet Treating Lavi Sheehan/Extender: Melburn Hake, HOYT Weeks in Treatment: 0 Electronic Signature(s) Signed:  03/31/2018 4:42:38 PM By: Roger Shelter Entered By: Roger Shelter on 03/31/2018 08:40:29 Bodmer, Yousaf CMarland Kitchen ([id number]) -------------------------------------------------------------------------------- Multi Wound Chart Details Patient Name: Schnorr, Jaren C. Date of Service: 03/31/2018 8:00 AM Medical Record Number: [id number] Patient Account Number: [id number] Date of Birth/Sex: 08-Jan-1941 (77 y.o. 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] [N/A:N/A] Comorbid History: [3:Type II Diabetes] [N/A:N/A] Date Acquired: [3:03/17/2018] [N/A:N/A] Weeks of Treatment: [3:0] [N/A:N/A] Wound Status: [3:Open] [N/A:N/A] Measurements L x W x D [3:1x0.8x0.1] [N/A:N/A] (cm) Area (cm) : [3:0.628] [N/A:N/A] Volume (cm) : [3:0.063] [N/A:N/A] Classification: [3:Category/Stage II] [N/A:N/A] Exudate Amount: [3:Medium] [N/A:N/A] Exudate Type: [3:Serous] [N/A:N/A] Exudate Color: [3:amber] [N/A:N/A] Wound Margin: [3:Flat and Intact] [N/A:N/A] Granulation Amount: [3:Small (1-33%)] [N/A:N/A] Granulation Quality: [3:Red, Pink] [N/A:N/A] Necrotic Amount: [3:Large (67-100%)] [N/A:N/A] Exposed Structures: [3:Fascia: No Fat Layer (Subcutaneous Tissue) Exposed: No Tendon: No Muscle: No Joint: No Bone: No] [N/A:N/A] Epithelialization: [3:None] [N/A:N/A] Debridement: [3:Chemical/Enzymatic/Mechanical] [N/A:N/A] Pre-procedure [3:08:50] [N/A:N/A] Verification/Time Out Taken: Pain Control: [3:Lidocaine 4% Topical Solution]  [N/A:N/A] Instrument: [3:Other(gauze)] [N/A:N/A] Bleeding: [3:Minimum] [N/A:N/A] Hemostasis Achieved: [3:Pressure] [N/A:N/A] Procedural Pain: [3:0] [N/A:N/A] Post Procedural Pain: [3:0] [N/A:N/A] Debridement Treatment Procedure was tolerated well N/A N/A Response: Post Debridement 1x0.8x0.1 N/A N/A Measurements L x W x D (cm) Post Debridement Volume: 0.063 N/A N/A (cm) Post Debridement Stage: Category/Stage II N/A N/A Periwound Skin Texture: Excoriation: No N/A N/A Induration: No Callus: No Crepitus: No Rash: No Scarring: No Periwound Skin Moisture: Maceration: No N/A N/A Dry/Scaly: No Periwound Skin Color: Atrophie Blanche: No N/A N/A Cyanosis: No Ecchymosis: No Erythema: No Hemosiderin Staining: No Mottled: No Pallor: No Rubor: No Temperature: No Abnormality N/A N/A Tenderness on Palpation: Yes N/A N/A Wound Preparation: Ulcer Cleansing: N/A N/A Rinsed/Irrigated with Saline Topical Anesthetic Applied:  Other: lidocaine 4% Procedures Performed: Debridement N/A N/A Treatment Notes Electronic Signature(s) Signed: 03/31/2018 4:49:47 PM By: Montey Hora Entered By: Montey Hora on 03/31/2018 09:15:52 Yeoman, Donnie Coffin ([id number]) -------------------------------------------------------------------------------- Multi-Disciplinary Care Plan Details Patient Name: Decoteau, Javyn C. Date of Service: 03/31/2018 8:00 AM Medical Record Number: [id number] Patient Account Number: [id number] Date of Birth/Sex: 04/13/1941 (77 y.o. M) Treating RN: Montey Hora Primary Care Jerni Selmer: Park Liter Other Clinician: Referring Ronalee Scheunemann: Carles Collet Treating Pryce Folts/Extender: Melburn Hake, HOYT Weeks in Treatment: 0 Active Inactive ` Abuse / Safety / Falls / Self Care Management Nursing Diagnoses: Impaired physical mobility Goals: Patient will not develop complications from immobility Date Initiated: 03/31/2018 Target Resolution Date: 06/17/2018 Goal Status:  Active Interventions: Assess fall risk on admission and as needed Notes: ` Nutrition Nursing Diagnoses: Imbalanced nutrition Goals: Patient/caregiver agrees to and verbalizes understanding of need to use nutritional supplements and/or vitamins as prescribed Date Initiated: 03/31/2018 Target Resolution Date: 06/17/2018 Goal Status: Active Interventions: Assess patient nutrition upon admission and as needed per policy Notes: ` Orientation to the Wound Care Program Nursing Diagnoses: Knowledge deficit related to the wound healing center program Goals: Patient/caregiver will verbalize understanding of the [facility] Date Initiated: 03/31/2018 Target Resolution Date: 06/17/2018 Goal Status: Active Interventions: BIRCH, FARINO ([id number]) Provide education on orientation to the wound center Notes: ` Pressure Nursing Diagnoses: Knowledge deficit related to causes and risk factors for pressure ulcer development Goals: Patient will remain free from development of additional pressure ulcers Date Initiated: 03/31/2018 Target Resolution Date: 06/17/2018 Goal Status: Active Interventions: Assess: immobility, friction, shearing, incontinence upon admission and as needed Notes: ` Wound/Skin Impairment Nursing Diagnoses: Impaired tissue integrity Goals: Ulcer/skin breakdown will heal within 14 weeks Date Initiated: 03/31/2018 Target Resolution Date: 06/17/2018 Goal Status: Active Interventions: Assess patient/caregiver ability to obtain necessary supplies Assess patient/caregiver ability to perform ulcer/skin care regimen upon admission and as needed Assess ulceration(s) every visit Notes: Electronic Signature(s) Signed: 03/31/2018 4:49:47 PM By: Montey Hora Entered By: Montey Hora on 03/31/2018 09:15:40 Starnes, Kia C. ([id number]) -------------------------------------------------------------------------------- Pain Assessment Details Patient Name: [name] Date of Service: 03/31/2018 8:00 AM Medical Record Number: [id number] Patient Account Number: [id number] Date of Birth/Sex: 1941/10/03 (77 y.o. M) Treating RN: Roger Shelter Primary Care Davette Nugent: Park Liter Other Clinician: Referring Sharri Loya: Carles Collet Treating Lorine Iannaccone/Extender: Melburn Hake, HOYT Weeks in Treatment: 0 Active Problems Location of Pain Severity and Description of Pain Patient Has Paino Yes Site Locations Pain Location: Pain in Ulcers Duration of the Pain. Constant / Intermittento Intermittent Pain Management and Medication Current Pain Management: Notes states hurts with sitting Electronic Signature(s) Signed: 03/31/2018 4:42:38 PM By: Roger Shelter Entered By: Roger Shelter on 03/31/2018 08:34:27 Doyon, Eamonn C. ([id number]) -------------------------------------------------------------------------------- Wound Assessment Details Patient Name: Zaldivar, Shamarion C. Date of Service: 03/31/2018 8:00 AM Medical Record Number: [id number] Patient Account Number: [id number] Date of Birth/Sex: November 02, 1940 (77 y.o. M) Treating RN: Roger Shelter Primary Care Larry Knipp: Park Liter Other Clinician: Referring Langley Ingalls: Carles Collet Treating Orlander Norwood/Extender: Melburn Hake, HOYT Weeks in Treatment: 0 Wound Status Wound Number: 3 Primary Etiology: Pressure Ulcer Wound Location: Left Gluteus Wound Status: Open Wounding Event: Pressure Injury Comorbid History: Type II Diabetes Date Acquired: 03/17/2018 Weeks Of Treatment: 0 Clustered Wound: No Photos Photo Uploaded By: Secundino Ginger on 03/31/2018 11:09:18 Wound Measurements Length: (cm) 1 % Reduction Width: (cm) 0.8 % Reduction Depth: (cm) 0.1 Epitheliali Area: (cm) 0.628 Tunneling: Volume: (cm) 0.063 Underminin in Area: in Volume: zation: None No g: No Wound Description Classification:  Category/Stage II Foul Odor Wound Margin: Flat and Intact Slough/Fib Exudate Amount: Medium Exudate  Type: Serous Exudate Color: amber After Cleansing: No rino Yes Wound Bed Granulation Amount: Small (1-33%) Exposed Structure Granulation Quality: Red, Pink Fascia Exposed: No Necrotic Amount: Large (67-100%) Fat Layer (Subcutaneous Tissue) Exposed: No Necrotic Quality: Adherent Slough Tendon Exposed: No Muscle Exposed: No Joint Exposed: No Bone Exposed: No Periwound Skin Texture Soeder, Truman C. ([id number]) Texture Color No Abnormalities Noted: No No Abnormalities Noted: No Callus: No Atrophie Blanche: No Crepitus: No Cyanosis: No Excoriation: No Ecchymosis: No Induration: No Erythema: No Rash: No Hemosiderin Staining: No Scarring: No Mottled: No Pallor: No Moisture Rubor: No No Abnormalities Noted: No Dry / Scaly: No Temperature / Pain Maceration: No Temperature: No Abnormality Tenderness on Palpation: Yes Wound Preparation Ulcer Cleansing: Rinsed/Irrigated with Saline Topical Anesthetic Applied: Other: lidocaine 4%, Electronic Signature(s) Signed: 03/31/2018 4:42:38 PM By: Roger Shelter Entered By: Roger Shelter on 03/31/2018 08:40:19 Copen, Donnie Coffin ([id number]) -------------------------------------------------------------------------------- Vitals Details Patient Name: [name] Date of Service: 03/31/2018 8:00 AM Medical Record Number: [id number] Patient Account Number: [id number] Date of Birth/Sex: [birth year] (77 y.o. M) Treating RN: Roger Shelter Primary Care Jadarion Halbig: Park Liter Other Clinician: Referring Juanantonio Stolar: Carles Collet Treating Shawnette Augello/Extender: Melburn Hake, HOYT Weeks in Treatment: 0 Vital Signs Time Taken: 08:35 Temperature (F): 98.5 Height (in): 67 Pulse (bpm): 84 Source: Measured Respiratory Rate (breaths/min): 18 Weight (lbs): 158 Blood Pressure (mmHg): 133/48 Source: Measured Reference Range: 80 - 120 mg / dl Body Mass Index (BMI): 24.7 Electronic Signature(s) Signed: 03/31/2018 4:42:38 PM By: Roger Shelter Entered By: Roger Shelter on 03/31/2018 08:35:44

## 2018-04-03 NOTE — Progress Notes (Signed)
[name] ([id number]) Visit Report for 03/31/2018 Chief Complaint Document Details Patient Name: [name], [name] Date of Service: 03/31/2018 8:00 AM Medical Record Number: [id number] Patient Account Number: [id number] Date of Birth/Sex: 1941/05/25 (76 y.o. M) Treating RN: Montey Hora Primary Care Provider: Park Liter Other Clinician: Referring Provider: Carles Collet Treating Provider/Extender: Melburn Hake, HOYT Weeks in Treatment: 0 Information Obtained from: Patient Chief Complaint Left gluteal ulcer Electronic Signature(s) Signed: 04/03/2018 12:55:36 AM By: Worthy Keeler PA-C Entered By: Worthy Keeler on 03/31/2018 09:06:23 Wildey, Donnie Coffin ([id number]) -------------------------------------------------------------------------------- Debridement Details Patient Name: [name] Date of Service: 03/31/2018 8:00 AM Medical Record Number: [id number] Patient Account Number: [id number] Date of Birth/Sex: 1941/07/03 (76 y.o. M) Treating RN: Montey Hora Primary Care Provider: Park Liter Other Clinician: Referring Provider: Carles Collet Treating Provider/Extender: Melburn Hake, HOYT Weeks in Treatment: 0 Debridement Performed for Wound #3 Left Gluteus Assessment: Performed By: Physician STONE III, HOYT E., PA-C Debridement Type: Chemical/Enzymatic/Mechanical Agent Used: saline Pre-procedure Verification/Time Yes - 08:50 Out Taken: Start Time: 08:50 Pain Control: Lidocaine 4% Topical Solution Instrument: Other : gauze Bleeding: Minimum Hemostasis Achieved: Pressure End Time: 08:52 Procedural Pain: 0 Post Procedural Pain: 0 Response to Treatment: Procedure was tolerated well Level of Consciousness: Awake and Alert Post Procedure Vitals: Temperature: 98.5 Pulse: 84 Respiratory Rate: 18 Blood Pressure: Systolic Blood Pressure: 992 Diastolic Blood Pressure: 48 Post Debridement Measurements of Total Wound Length: (cm) 1 Stage: Category/Stage  II Width: (cm) 0.8 Depth: (cm) 0.1 Volume: (cm) 0.063 Character of Wound/Ulcer Post Improved Debridement: Post Procedure Diagnosis Same as Pre-procedure Electronic Signature(s) Signed: 03/31/2018 4:49:47 PM By: Montey Hora Signed: 04/03/2018 12:55:36 AM By: Worthy Keeler PA-C Entered By: Montey Hora on 03/31/2018 08:57:11 Corkum, Zebulun C. ([id number]) -------------------------------------------------------------------------------- HPI Details Patient Name: [name] Date of Service: 03/31/2018 8:00 AM Medical Record Number: [id number] Patient Account Number: [id number] Date of Birth/Sex: 04/17/1941 (76 y.o. M) Treating RN: Montey Hora Primary Care Provider: Park Liter Other Clinician: Referring Provider: Carles Collet Treating Provider/Extender: Melburn Hake, HOYT Weeks in Treatment: 0 History of Present Illness HPI Description: 07/08/17 on evaluation today patient presents initially for issues that he has been having with bilateral lower extremities this has been going on for the best I can tell "about a month" he has been seen by his primary care provider at [facility] family practice and they have been performing wraps. They also did get him set up with home health although there was confusion about what company was providing the home health for him. Unfortunately patient with a poor historian and it was difficult to gather information about how this has really progressed over the past month. Nonetheless he does have erythema noted of the right lower extremity and ulceration at this location as well. He has what appears to be lymphedema noted of the bilateral lower extremities and the left forefoot region is macerated potentially with a fungal infection noted as well. No fevers, chills, nausea, or vomiting noted at this time. As best I can gather patient has a history of hypothyroidism, hypertension, medicinal and antinausea medication he tells me due to the fact  that he has been on the antibiotic. 07/22/2017 -- I'm reviewing this 77 year old gentleman for the first time today, and notes that he was recently in hospital but he got himself discharge AMA, and during this time he was treated for acute hyponatremia, cellulitis and bilateral leg ulceration with a history of alcohol abuse. The patient's hemoglobin A1c was 5.1 the last time  he was in hospital and he was also noted to have cellulitis of the lower extremity and since he could not tolerate Augmentin was started on Bactrim. the patient and his son both have limited understanding and knowledge and it seems quite difficult to make them understand the treatment plan 08/05/17 on evaluation today patient's wound appears to be healed and he is doing very well. Unfortunately we have been unable to obtain the compression stockings for him at this point. We will discuss it further and plan. Otherwise he has no other concerns. Readmission: 03/31/18 on evaluation today patient is seen back in our clinic for a different issue than what we previously have evaluated him for. 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 He states this only seems to hurt when he sits and unfortunately he tells me that he sits the majority of his day in his chair. I did ask him about whether or not he had accounts that he can [place] he states he does but he never lays on it. He does have multiple myeloma and is currently under treatment for this. With that being said he also tells [place] that he just eats peanut butter sandwiches and boiled eggs each day and drinks beer. When directly asked if each anything else he says no. Nonetheless it does appear that the wound itself is fairly superficial and I think could actually heal very nicely with appropriate treatment and therapy. Electronic Signature(s) Signed: 04/03/2018 12:55:36 AM By: Worthy Keeler PA-C Entered By:  Worthy Keeler on 03/31/2018 09:08:03 Pasion, Donnie Coffin ([id number]) -------------------------------------------------------------------------------- Physical Exam Details Patient Name: Shives, Dimitriy C. Date of Service: 03/31/2018 8:00 AM Medical Record Number: [id number] Patient Account Number: [id number] Date of Birth/Sex: June 18, 1941 (76 y.o. M) Treating RN: Montey Hora Primary Care Provider: Park Liter Other Clinician: Referring Provider: Carles Collet Treating Provider/Extender: Melburn Hake, HOYT Weeks in Treatment: 0 Constitutional sitting or standing blood pressure is within target range for patient.. Well-nourished and well-hydrated in no acute distress. Eyes conjunctiva clear no eyelid edema noted. pupils equal round and reactive to light and accommodation. Ears, Nose, Mouth, and Throat no gross abnormality of ear auricles or external auditory canals. normal hearing noted during conversation. mucus membranes moist. Respiratory normal breathing without difficulty. clear to auscultation bilaterally. Cardiovascular regular rate and rhythm with normal S1, S2. no clubbing, cyanosis, significant edema, <3 sec cap refill. Gastrointestinal (GI) soft, non-tender, non-distended, +BS. no ventral hernia noted. Musculoskeletal Patient unable to walk without assistance. Psychiatric this patient is able to make decisions and demonstrates good insight into disease process. Alert and Oriented x 3. pleasant and cooperative. Notes At this point in regard to the patient's wound this is a very superficial wound that I do believe can show signs of improvement with appropriate therapy. No debridement was required as far as surgical debridement is concerned although mechanical debridement utilizing saline gauze was utilized to clean the surface of the wound. Fortunately patient tolerated this with minimal discomfort there was no significant bleeding. Electronic Signature(s) Signed: 04/03/2018  12:55:36 AM By: Worthy Keeler PA-C Entered By: Worthy Keeler on 03/31/2018 09:09:24 Bezek, Donnie Coffin ([id number]) -------------------------------------------------------------------------------- Physician Orders Details Patient Name: [name] Date of Service: 03/31/2018 8:00 AM Medical Record Number: [id number] Patient Account Number: [id number] Date of Birth/Sex: 04-Dec-1940 (76 y.o. M) Treating RN: Montey Hora Primary Care Provider: Park Liter Other Clinician: Referring Provider: Carles Collet Treating Provider/Extender: Joaquim Lai  III, HOYT Weeks in Treatment: 0 Verbal / Phone Orders: No Diagnosis Coding ICD-10 Coding Code Description I89.0 Lymphedema, not elsewhere classified I73.9 Peripheral vascular disease, unspecified Wound Cleansing Wound #3 Left Gluteus o Clean wound with Normal Saline. o May Shower, gently pat wound dry prior to applying new dressing. Anesthetic (add to Medication List) Wound #3 Left Gluteus o Topical Lidocaine 4% cream applied to wound bed prior to debridement (In Clinic Only). Primary Wound Dressing Wound #3 Left Gluteus o Silver Collagen - in clinic o Xeroform - HHRN to use xeroform if patient runs out of silver collagen that was given to him from clinic Secondary Dressing Wound #3 Left Gluteus o Boardered Foam Dressing Dressing Change Frequency Wound #3 Left Gluteus o Change Dressing Monday, Wednesday, Friday Follow-up Appointments Wound #3 Left Gluteus o Return Appointment in 1 week. Off-Loading Wound #3 Left Gluteus o Turn and reposition every 2 hours Additional Orders / Instructions Wound #3 Left Gluteus o Vitamin A; Vitamin C, Zinc - Please add a multivitamin that has vitamin A, vitamin C and zinc in it o Increase protein intake. o Activity as tolerated Blyden, Shermar C. ([id number]) Home Health Wound #3 Left [place] Nurse may visit PRN to address  patientos wound care needs. o FACE TO FACE ENCOUNTER: MEDICARE and MEDICAID PATIENTS: I certify that this patient is under my care and that I had a face-to-face encounter that meets the physician face-to-face encounter requirements with this patient on this date. The encounter with the patient was in whole or in part for the following MEDICAL CONDITION: (primary reason for [employer]) MEDICAL NECESSITY: I certify, that based on my findings, NURSING services are a medically necessary home health service. HOME BOUND STATUS: I certify that my clinical findings support that this patient is homebound (i.e., Due to illness or injury, pt requires aid of supportive devices such as crutches, cane, wheelchairs, walkers, the use of special transportation or the assistance of another person to leave their place of residence. There is a normal inability to leave the home and doing so requires considerable and taxing effort. Other absences are for medical reasons / religious services and are infrequent or of short duration when for other reasons). o If current dressing causes regression in wound condition, may D/C ordered dressing product/s and apply Normal Saline Moist Dressing daily until next [place] / Other MD appointment. [place] of regression in wound condition at [phone]. o Please direct any NON-WOUND related issues/requests for orders to patient's Primary Care Physician Electronic Signature(s) Signed: 03/31/2018 4:49:47 PM By: Montey Hora Signed: 04/03/2018 12:55:36 AM By: Worthy Keeler PA-C Entered By: Montey Hora on 03/31/2018 09:06:21 Rossin, Cliford Loletha Grayer ([id number]) -------------------------------------------------------------------------------- Problem List Details Patient Name: Muenchow, Koda C. Date of Service: 03/31/2018 8:00 AM Medical Record Number: [id number] Patient Account Number: [id number] Date of Birth/Sex: 01/24/1941 (76 y.o.  M) Treating RN: Montey Hora Primary Care Provider: Park Liter Other Clinician: Referring Provider: Carles Collet Treating Provider/Extender: Melburn Hake, HOYT Weeks in Treatment: 0 Active Problems ICD-10 Evaluated Encounter Code Description Active Date Today Diagnosis I89.0 Lymphedema, not elsewhere classified 03/31/2018 No Yes I73.9 Peripheral vascular disease, unspecified 03/31/2018 No Yes L89.322 Pressure ulcer of left buttock, stage 2 03/31/2018 No Yes C90.00 Multiple myeloma not having achieved remission 03/31/2018 No Yes Inactive Problems Resolved Problems Electronic Signature(s) Signed: 04/03/2018 12:55:36 AM By: Worthy Keeler PA-C Entered By: Worthy Keeler on 03/31/2018 09:05:55 Baune, Thelbert C. ([id number]) --------------------------------------------------------------------------------  Progress Note Details Patient Name: Biss, Leray C. Date of Service: 03/31/2018 8:00 AM Medical Record Number: [id number] Patient Account Number: [id number] Date of Birth/Sex: [birth year] (76 y.o. M) Treating RN: Montey Hora Primary Care Provider: Park Liter Other Clinician: Referring Provider: Carles Collet Treating Provider/Extender: Melburn Hake, HOYT Weeks in Treatment: 0 Subjective Chief Complaint Information obtained from Patient Left gluteal ulcer History of Present Illness (HPI) 07/08/17 on evaluation today patient presents initially for issues that he has been having with bilateral lower extremities this has been going on for the best I can tell "about a month" he has been seen by his primary care provider at [facility] family practice and they have been performing wraps. They also did get him set up with home health although there was confusion about what company was providing the home health for him. Unfortunately patient with a poor historian and it was difficult to gather information about how this has really progressed over the past month. Nonetheless he does have  erythema noted of the right lower extremity and ulceration at this location as well. He has what appears to be lymphedema noted of the bilateral lower extremities and the left forefoot region is macerated potentially with a fungal infection noted as well. No fevers, chills, nausea, or vomiting noted at this time. As best I can gather patient has a history of hypothyroidism, hypertension, medicinal and antinausea medication he tells me due to the fact that he has been on the antibiotic. 07/22/2017 -- I'm reviewing this 77 year old gentleman for the first time today, and notes that he was recently in hospital but he got himself discharge AMA, and during this time he was treated for acute hyponatremia, cellulitis and bilateral leg ulceration with a history of alcohol abuse. The patient's hemoglobin A1c was 5.1 the last time he was in hospital and he was also noted to have cellulitis of the lower extremity and since he could not tolerate Augmentin was started on Bactrim. the patient and his son both have limited understanding and knowledge and it seems quite difficult to make them understand the treatment plan 08/05/17 on evaluation today patient's wound appears to be healed and he is doing very well. Unfortunately we have been unable to obtain the compression stockings for him at this point. We will discuss it further and plan. Otherwise he has no other concerns. Readmission: 03/31/18 on evaluation today patient is seen back in our clinic for a different issue than what we previously have evaluated him for. 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 He states this only seems to hurt when he sits and unfortunately he tells me that he sits the majority of his day in his chair. I did ask him about whether or not he had accounts that he can [place] he states he does but he never lays on it. He does have multiple myeloma and is  currently under treatment for this. With that being said he also tells [place] that he just eats peanut butter sandwiches and boiled eggs each day and drinks beer. When directly asked if each anything else he says no. Nonetheless it does appear that the wound itself is fairly superficial and I think could actually heal very nicely with appropriate treatment and therapy. Wound History Patient presents with 1 open wound that has been present for approximately 2 weeks. Patient has been treating wound in the following manner: vaseline. Laboratory tests have not been  performed in the last month. Patient reportedly has not tested positive for an antibiotic resistant organism. Patient reportedly has not tested positive for osteomyelitis. Patient reportedly has not had testing performed to evaluate circulation in the legs. Patient experiences the following problems associated with their wounds: infection. QUATAVIOUS, ROSSA ([id number]) Patient History Unable to Obtain Patient History due to Altered Mental Status. Information obtained from Patient. Allergies No Known Drug Allergies Social History Former smoker, Marital Status - Married, Alcohol Use - Daily, Drug Use - No History, Caffeine Use - Rarely. Medical And Surgical History Notes Oncologic multiple myeloma Review of Systems (ROS) Eyes Complains or has symptoms of Glasses / Contacts - reading. Objective Constitutional sitting or standing blood pressure is within target range for patient.. Well-nourished and well-hydrated in no acute distress. Vitals Time Taken: 8:35 AM, Height: 67 in, Source: Measured, Weight: 158 lbs, Source: Measured, BMI: 24.7, Temperature: 98.5 F, Pulse: 84 bpm, Respiratory Rate: 18 breaths/min, Blood Pressure: 133/48 mmHg. Eyes conjunctiva clear no eyelid edema noted. pupils equal round and reactive to light and accommodation. Ears, Nose, Mouth, and Throat no gross abnormality of ear auricles or external auditory canals.  normal hearing noted during conversation. mucus membranes moist. Respiratory normal breathing without difficulty. clear to auscultation bilaterally. Cardiovascular regular rate and rhythm with normal S1, S2. no clubbing, cyanosis, significant edema, Gastrointestinal (GI) soft, non-tender, non-distended, +BS. no ventral hernia noted. Musculoskeletal Patient unable to walk without assistance. Psychiatric this patient is able to make decisions and demonstrates good insight into disease process. Alert and Oriented x 3. pleasant and cooperative. ALVINO, LECHUGA ([id number]) General Notes: At this point in regard to the patient's wound this is a very superficial wound that I do believe can show signs of improvement with appropriate therapy. No debridement was required as far as surgical debridement is concerned although mechanical debridement utilizing saline gauze was utilized to clean the surface of the wound. Fortunately patient tolerated this with minimal discomfort there was no significant bleeding. Integumentary (Hair, Skin) Wound #3 status is Open. Original cause of wound was Pressure Injury. The wound is located on the Left Gluteus. The wound measures 1cm length x 0.8cm width x 0.1cm depth; 0.628cm^2 area and 0.063cm^3 volume. There is no tunneling or undermining noted. There is a medium amount of serous drainage noted. The wound margin is flat and intact. There is small (1-33%) red, pink granulation within the wound bed. There is a large (67-100%) amount of necrotic tissue within the wound bed including Adherent Slough. The periwound skin appearance did not exhibit: Callus, Crepitus, Excoriation, Induration, Rash, Scarring, Dry/Scaly, Maceration, Atrophie Blanche, Cyanosis, Ecchymosis, Hemosiderin Staining, Mottled, Pallor, Rubor, Erythema. Periwound temperature was noted as No Abnormality. The periwound has tenderness on palpation. Assessment Active Problems ICD-10 Lymphedema, not  elsewhere classified Peripheral vascular disease, unspecified Pressure ulcer of left buttock, stage 2 Multiple myeloma not having achieved remission Procedures Wound #3 Pre-procedure diagnosis of Wound #3 is a Pressure Ulcer located on the Left Gluteus . There was a Chemical/Enzymatic/Mechanical debridement performed by STONE III, HOYT E., PA-C. With the following instrument(s): gauze after achieving pain control using Lidocaine 4% Topical Solution. Other agent used was saline. A time out was conducted at 08:50, prior to the start of the procedure. A Minimum amount of bleeding was controlled with Pressure. The procedure was tolerated well with a pain level of 0 throughout and a pain level of 0 following the procedure. Patient s Level of Consciousness post procedure was recorded as Awake and Alert.  Post Debridement Measurements: 1cm length x 0.8cm width x 0.1cm depth; 0.063cm^3 volume. Post debridement Stage noted as Category/Stage II. Character of Wound/Ulcer Post Debridement is improved. Post procedure Diagnosis Wound #3: Same as Pre-Procedure Plan Wound Cleansing: Wound #3 Left Gluteus: Clean wound with Normal Saline. May Shower, gently pat wound dry prior to applying new dressing. Wurth, Leul C. ([id number]) Anesthetic (add to Medication List): Wound #3 Left Gluteus: Topical Lidocaine 4% cream applied to wound bed prior to debridement (In Clinic Only). Primary Wound Dressing: Wound #3 Left Gluteus: Silver Collagen - in clinic Xeroform - HHRN to use xeroform if patient runs out of silver collagen that was given to him from clinic Secondary Dressing: Wound #3 Left Gluteus: Boardered Foam Dressing Dressing Change Frequency: Wound #3 Left Gluteus: Change Dressing Monday, Wednesday, Friday Follow-up Appointments: Wound #3 Left Gluteus: Return Appointment in 1 week. Off-Loading: Wound #3 Left Gluteus: Turn and reposition every 2 hours Additional Orders / Instructions: Wound #3  Left Gluteus: Vitamin A; Vitamin C, Zinc - Please add a multivitamin that has vitamin A, vitamin C and zinc in it Increase protein intake. Activity as tolerated Home Health: Wound #3 Left Gluteus: [place] Nurse may visit PRN to address patient s wound care needs. FACE TO FACE ENCOUNTER: MEDICARE and MEDICAID PATIENTS: I certify that this patient is under my care and that I had a face-to-face encounter that meets the physician face-to-face encounter requirements with this patient on this date. The encounter with the patient was in whole or in part for the following MEDICAL CONDITION: (primary reason for [place]) MEDICAL NECESSITY: I certify, that based on my findings, NURSING services are a medically necessary home health service. HOME BOUND STATUS: I certify that my clinical findings support that this patient is homebound (i.e., Due to illness or injury, pt requires aid of supportive devices such as crutches, cane, wheelchairs, walkers, the use of special transportation or the assistance of another person to leave their place of residence. There is a normal inability to leave the home and doing so requires considerable and taxing effort. Other absences are for medical reasons / religious services and are infrequent or of short duration when for other reasons). If current dressing causes regression in wound condition, may D/C ordered dressing product/s and apply Normal Saline Moist Dressing daily until next [place] / Other MD appointment. [place] of regression in wound condition at [phone]. Please direct any NON-WOUND related issues/requests for orders to patient's Primary Care Physician At this point I'm gonna recommend that we go ahead and initiate the above wound care orders for the next week I did have a conversation with the patient as well about sitting in the fact that he does not need to sit for long periods of  time. Specifically he does have a couch he states he can will lie on and I think that would be a good idea for him. Otherwise we are going to see how things stand in one weeks time we see him back for reevaluation. Patient is in agreement with that plan. Please see above for specific wound care orders. We will see patient for re-evaluation in 1 week(s) here in the clinic. If anything worsens or changes patient will contact our office for additional recommendations. Electronic Signature(s) Signed: 04/03/2018 12:55:36 AM By: Worthy Keeler PA-C Entered By: Worthy Keeler on 03/31/2018 09:10:10 Madkins, Donnie Coffin ([id number]) Bivins, Donnie Coffin ([id number]) -------------------------------------------------------------------------------- ROS/PFSH Details Patient Name: Vertell Limber,  Oiva C. Date of Service: 03/31/2018 8:00 AM Medical Record Number: [id number] Patient Account Number: [id number] Date of Birth/Sex: 10-Apr-1941 (76 y.o. M) Treating RN: Roger Shelter Primary Care Provider: Park Liter Other Clinician: Referring Provider: Carles Collet Treating Provider/Extender: Melburn Hake, HOYT Weeks in Treatment: 0 Unable to Obtain Patient History due to oo Altered Mental Status Information Obtained From Patient Wound History Do you currently have one or more open woundso Yes How many open wounds do you currently haveo 1 Approximately how long have you had your woundso 2 weeks How have you been treating your wound(s) until nowo vaseline Has your wound(s) ever healed and then re-openedo No Have you had any lab work done in the past montho No Have you tested positive for an antibiotic resistant organism (MRSA, VRE)o No Have you tested positive for osteomyelitis (bone infection)o No Have you had any tests for circulation on your legso No Have you had other problems associated with your woundso Infection Eyes Complaints and Symptoms: Positive for: Glasses / Contacts - reading Endocrine Medical  History: Positive for: Type II Diabetes Treated with: Oral agents Blood sugar tested every day: No Oncologic Medical History: Past Medical History Notes: multiple myeloma Immunizations Pneumococcal Vaccine: Received Pneumococcal Vaccination: No Implantable Devices Family and Social History Former smoker; Marital Status - Married; Alcohol Use: Daily; Drug Use: No History; Caffeine Use: Rarely; Advanced Directives: No; Patient does not want information on Advanced Directives; Do not resuscitate: No; Living Will: No; Medical Power of Attorney: No Electronic Signature(s) ZAVIOR, THOMASON ([id number]) Signed: 03/31/2018 4:42:38 PM By: Roger Shelter Signed: 04/03/2018 12:55:36 AM By: Worthy Keeler PA-C Entered By: Roger Shelter on 03/31/2018 08:46:05 Priola, Donnie Coffin ([id number]) -------------------------------------------------------------------------------- SuperBill Details Patient Name: Loomer, Claudia C. Date of Service: 03/31/2018 Medical Record Number: [id number] Patient Account Number: [id number] Date of Birth/Sex: [birth year] (77 y.o. 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, stage 2 C90.00 Multiple myeloma not having achieved remission Facility Procedures CPT4 Code: [id number] Description: 99214 - WOUND CARE VISIT-LEV 4 EST PT Modifier: Quantity: 1 Physician Procedures CPT4 Code: 7939688 Description: 64847 - WC PHYS LEVEL 4 - EST PT ICD-10 Diagnosis Description I89.0 Lymphedema, not elsewhere classified I73.9 Peripheral vascular disease, unspecified L89.322 Pressure ulcer of left buttock, stage 2 C90.00 Multiple myeloma not having  achieved  remission Modifier: Quantity: 1 Electronic Signature(s) Signed: 03/31/2018 4:49:47 PM By: Montey Hora Signed: 04/03/2018 12:55:36 AM By: Worthy Keeler PA-C Entered By: Montey Hora on 03/31/2018 09:16:34

## 2018-04-03 NOTE — Progress Notes (Signed)
[place] [facility]  [phone] [phone]) [phone]  ID: [name] OB: 1941/06/29  MR#: [id number]  PJA#:[id number]  Patient Care Team: Valerie Roys, DO as PCP - General (Family Medicine)  CHIEF COMPLAINT: Multiple myeloma.  INTERVAL HISTORY: Patient returns to clinic today for further evaluation and discussion of his bone marrow biopsy results.  He has worsening weakness and fatigue.  He has a poor appetite and continues to lose weight.  He has had several recent falls, which his son attributes to Remeron given to him by his primary care physician.  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 He denies any fevers or illnesses. He denies any easy bleeding or bruising. He denies any chest pain or shortness of breath. He has no nausea, vomiting, constipation, or diarrhea. He has no urinary complaints.  Patient feels generally terrible, but offers no further specific complaints.  REVIEW OF SYSTEMS:   Review of Systems  Constitutional: Positive for malaise/fatigue and weight loss. Negative for fever.  Respiratory: Negative.  Negative for cough and shortness of breath.   Cardiovascular: Negative.  Negative for chest pain and leg swelling.  Gastrointestinal: Negative.  Negative for abdominal pain, blood in stool and melena.  Genitourinary: Negative.   Musculoskeletal: Positive for falls.  Skin: Negative.  Negative for rash.  Neurological: Positive for dizziness, tingling, sensory change and weakness. Negative for focal weakness.  Endo/Heme/Allergies: Negative.  Does not bruise/bleed easily.  Psychiatric/Behavioral: Positive for substance abuse. The patient is not nervous/anxious.     As per HPI. Otherwise, a complete review of systems is negative.  PAST MEDICAL HISTORY: Past Medical History:  Diagnosis Date  . Arthritis   . Cancer ([employer])    multiple myaloma  . Diabetes mellitus   . Dyspnea   . History of exercise stress test 04/2009   ischemia in LAD  with normal EF  . Hypertension   . Hypothyroidism   . Neuropathy   . Peripheral vascular disease ([place])   . Venous stasis     PAST SURGICAL HISTORY: Past Surgical History:  Procedure Laterality Date  . CATARACT EXTRACTION W/PHACO Right 10/25/2016   Procedure: CATARACT EXTRACTION PHACO AND INTRAOCULAR LENS PLACEMENT (IOC)  right eye complicated;  Surgeon: Ronnell Freshwater, MD;  Location: [facility];  Service: Ophthalmology;  Laterality: Right;  Right eye Diabetic - oral meds Vision blue  . FINGER SURGERY      FAMILY HISTORY Family History  Problem Relation Age of Onset  . Heart disease Mother   . Alzheimer's disease Sister   . Heart disease Maternal Aunt        ADVANCED DIRECTIVES:    HEALTH MAINTENANCE: Social History   Tobacco Use  . Smoking status: Former Smoker    Packs/day: 0.25    Types: Cigarettes    Last attempt to quit: 03/23/2015    Years since quitting: 3.0  . Smokeless tobacco: Current User    Types: Chew  Substance Use Topics  . Alcohol use: Yes    Alcohol/week: 25.2 oz    Types: 42 Cans of beer per week    Comment: 5-6 every day  . Drug use: No     Colonoscopy:  PAP:  Bone density:  Lipid panel:  Allergies  Allergen Reactions  . Metformin And Related Nausea And Vomiting    Current Outpatient Medications  Medication Sig Dispense Refill  . acyclovir (ZOVIRAX) 400 MG tablet Take 1 tablet (400 mg total) by mouth 2 (two) times daily. 60 tablet  3  . amitriptyline (ELAVIL) 25 MG tablet Take 25 mg by mouth at bedtime.    Marland Kitchen dexamethasone (DECADRON) 4 MG tablet Take 5 tablets (20 mg total) by mouth once a week. Take 20 mg on Monday of each week. 30 tablet 1  . folic acid (FOLVITE) 1 MG tablet Take 1 tablet (1 mg total) by mouth daily. 30 tablet 2  . gabapentin (NEURONTIN) 300 MG capsule Take 2 capsules (600 mg total) by mouth 3 (three) times daily. 90 capsule 1  . levothyroxine (SYNTHROID, LEVOTHROID) 75 MCG tablet Take 1 tablet (75  mcg total) daily before breakfast by mouth. 90 tablet 3  . mirtazapine (REMERON) 15 MG tablet Take 1 tablet (15 mg total) by mouth at bedtime. 90 tablet 0  . Multiple Vitamin (MULTIVITAMIN) tablet Take 1 tablet by mouth daily.    . ondansetron (ZOFRAN) 8 MG tablet     . prochlorperazine (COMPAZINE) 10 MG tablet Take 1 tablet (10 mg total) by mouth every 6 (six) hours as needed (Nausea or vomiting). 60 tablet 2  . thiamine (VITAMIN B-1) 100 MG tablet Take 1 tablet (100 mg total) by mouth daily. 30 tablet 2  . megestrol (MEGACE) 40 MG tablet Take 1 tablet (40 mg total) by mouth daily. 30 tablet 2   No current facility-administered medications for this visit.     OBJECTIVE: Vitals:   04/05/18 1054 04/05/18 1059  BP:  (!) 133/57  Pulse:  77  Resp: 12   Temp:  (!) 97.3 F (36.3 C)     Body mass index is 22.41 kg/m.    ECOG FS:0 - Asymptomatic  General: Disheveled appearing, no acute distress. Eyes: Pink conjunctiva, anicteric sclera. HEENT: Normocephalic, moist mucous membranes, clear oropharnyx. Lungs: Clear to auscultation bilaterally. Heart: Regular rate and rhythm. No rubs, murmurs, or gallops. Abdomen: Soft, nontender, nondistended. No organomegaly noted, normoactive bowel sounds. Musculoskeletal: No edema, cyanosis, or clubbing. Neuro: Alert, answering all questions appropriately. Cranial nerves grossly intact. Skin: No rashes or petechiae noted. Psych: Normal affect.  LAB RESULTS:  Lab Results  Component Value Date   NA 126 (L) 02/20/2018   K 4.5 02/20/2018   CL 93 (L) 02/20/2018   CO2 22 02/20/2018   GLUCOSE 163 (H) 02/20/2018   BUN 9 02/20/2018   CREATININE 0.54 (L) 02/20/2018   CALCIUM 8.7 (L) 02/20/2018   PROT 5.9 (L) 02/20/2018   ALBUMIN 3.9 02/20/2018   AST 32 02/20/2018   ALT 23 02/20/2018   ALKPHOS 97 02/20/2018   BILITOT 1.7 (H) 02/20/2018   GFRNONAA >60 02/20/2018   GFRAA >60 02/20/2018    Lab Results  Component Value Date   WBC 2.4 (L) 2018/04/24     NEUTROABS 1.5 Apr 24, 2018   HGB 9.7 (L) April 24, 2018   HCT 26.6 (L) 04-24-2018   MCV 112.4 (H) 04/24/2018   PLT 65 (L) 2018-04-24     STUDIES: Ct Biopsy  Result Date: 04/24/18 INDICATION: 77 year old with multiple myeloma. Bone marrow biopsy needed for treatment planning. EXAM: CT GUIDED BONE MARROW ASPIRATES AND BIOPSY Physician: Stephan Minister. Anselm Pancoast, MD MEDICATIONS: None. ANESTHESIA/SEDATION: Fentanyl 25 mcg IV; Versed 1.0 mg IV Moderate Sedation Time:  14 minutes The patient was continuously monitored during the procedure by the interventional radiology nurse under my direct supervision. COMPLICATIONS: None immediate. PROCEDURE: The procedure was explained to the patient. The risks and benefits of the procedure were discussed and the patient's questions were addressed. Informed consent was obtained from the patient. The patient was placed prone  on CT table. Images of the pelvis were obtained. The right side of back was prepped and draped in sterile fashion. The skin and right posterior ilium were anesthetized with 1% lidocaine. 11 gauge bone needle was directed into the right ilium with CT guidance. Two aspirates and two core biopsies were obtained. Bandage placed over the puncture site. IMPRESSION: CT guided bone marrow aspiration and core biopsy. Electronically Signed   By: Markus Daft M.D.   On: 03/28/2018 10:21    ASSESSMENT: Multiple myeloma.  PLAN:  1.  Multiple myeloma: Repeat bone marrow biopsy on March 28, 2018 revealed 23 to 24% plasma cells with kappa restriction, which is only slightly decreased from previous when it was reported patient to have 29% plasma cells.  He has normal cytogenetics.  His kappa free light chains continue to be significantly elevated, but essentially unchanged.  His kappa/lambda light chain ratio is 355.52.  He does not have an M spike on SPEP.  He continues to have significant pancytopenia which is essentially unchanged.  His calcium and creatinine are within normal  limits.  Patient never had a metastatic bone survey that was scheduled previously.  Patient could not tolerate Cytoxan given his persistent pancytopenia.  He completed 6 cycles of Velcade on Feb 23, 2018.  Unclear if this can be used again given his persistent peripheral neuropathy.  Hesitant to use Revlimid or other oral treatments secondary to compliance concerns.  His performance status is declining, therefore will continue to hold treatment at this time.  Return to clinic in 1 month for repeat laboratory work and further evaluation.  2.  Leukopenia: Chronic and unchanged. 3.  Anemia: Patient's hemoglobin has improved to 9.7 with blood transfusion.  Continue to monitor closely. 4.  Thrombocytopenia: Chronic and unchanged.  Monitor. 5.  Elevated MCV: Chronic and unchanged.  Patient's B12 and folate are within normal limits. 6. Peripheral neuropathy: Present prior to beginning treatment and unlikely related to underlying myeloma or his chemotherapy.  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  Continue gabapentin 600 mg 3 times a day.  Patient is not taking folic acid, thiamine, and a multivitamin as directed. 7.  Poor appetite: Given patient's dizziness and falls will discontinue Remeron and he is given a prescription for Megace today. 8.  Weakness and fatigue/declining performance status: Likely multifactorial.  Hold treatment as above.  Megace as above.  Patient expressed understanding and was in agreement with this plan. He also understands that He can call clinic at any time with any questions, concerns, or complaints.    Lloyd Huger, MD   04/09/2018 7:39 AM

## 2018-04-04 ENCOUNTER — Telehealth: Payer: Self-pay | Admitting: Family Medicine

## 2018-04-04 DIAGNOSIS — E1165 Type 2 diabetes mellitus with hyperglycemia: Secondary | ICD-10-CM | POA: Diagnosis not present

## 2018-04-04 DIAGNOSIS — E039 Hypothyroidism, unspecified: Secondary | ICD-10-CM | POA: Diagnosis not present

## 2018-04-04 DIAGNOSIS — E1140 Type 2 diabetes mellitus with diabetic neuropathy, unspecified: Secondary | ICD-10-CM | POA: Diagnosis not present

## 2018-04-04 DIAGNOSIS — E538 Deficiency of other specified B group vitamins: Secondary | ICD-10-CM | POA: Diagnosis not present

## 2018-04-04 DIAGNOSIS — C9000 Multiple myeloma not having achieved remission: Secondary | ICD-10-CM | POA: Diagnosis not present

## 2018-04-04 DIAGNOSIS — E1122 Type 2 diabetes mellitus with diabetic chronic kidney disease: Secondary | ICD-10-CM | POA: Diagnosis not present

## 2018-04-04 DIAGNOSIS — N181 Chronic kidney disease, stage 1: Secondary | ICD-10-CM | POA: Diagnosis not present

## 2018-04-04 DIAGNOSIS — I129 Hypertensive chronic kidney disease with stage 1 through stage 4 chronic kidney disease, or unspecified chronic kidney disease: Secondary | ICD-10-CM | POA: Diagnosis not present

## 2018-04-04 NOTE — Telephone Encounter (Signed)
Copied from [place] [phone]. Topic: Inquiry >> Apr 04, 2018 11:46 AM [name] wrote: Reason for CRM: [facility] called to report that the pt's son states the pt has had multiple falls, pt has bruising on the left arm; pt has had a medication given for pt's appetite(mirtazapine (REMERON) 15 MG tablet [[id number]]) which apparently was causing dizziness which resulted to the falls in the morning since the pt was taking it at night; but they stopped giving that medication and there hasnt been a fall since which was in the last 24hrs, contact pt if needed

## 2018-04-05 ENCOUNTER — Encounter: Payer: Self-pay | Admitting: Oncology

## 2018-04-05 ENCOUNTER — Other Ambulatory Visit: Payer: Self-pay

## 2018-04-05 ENCOUNTER — Inpatient Hospital Stay: Payer: Medicare Other | Attending: Oncology | Admitting: Oncology

## 2018-04-05 ENCOUNTER — Encounter (HOSPITAL_COMMUNITY): Payer: Self-pay | Admitting: Oncology

## 2018-04-05 VITALS — BP 133/57 | HR 77 | Temp 97.3°F | Resp 12 | Wt 156.2 lb

## 2018-04-05 DIAGNOSIS — D649 Anemia, unspecified: Secondary | ICD-10-CM | POA: Insufficient documentation

## 2018-04-05 DIAGNOSIS — G629 Polyneuropathy, unspecified: Secondary | ICD-10-CM | POA: Insufficient documentation

## 2018-04-05 DIAGNOSIS — R5383 Other fatigue: Secondary | ICD-10-CM | POA: Diagnosis not present

## 2018-04-05 DIAGNOSIS — C9000 Multiple myeloma not having achieved remission: Secondary | ICD-10-CM

## 2018-04-05 DIAGNOSIS — D696 Thrombocytopenia, unspecified: Secondary | ICD-10-CM | POA: Insufficient documentation

## 2018-04-05 DIAGNOSIS — R531 Weakness: Secondary | ICD-10-CM | POA: Diagnosis not present

## 2018-04-05 DIAGNOSIS — R42 Dizziness and giddiness: Secondary | ICD-10-CM | POA: Diagnosis not present

## 2018-04-05 DIAGNOSIS — R630 Anorexia: Secondary | ICD-10-CM | POA: Diagnosis not present

## 2018-04-05 DIAGNOSIS — R7989 Other specified abnormal findings of blood chemistry: Secondary | ICD-10-CM | POA: Diagnosis not present

## 2018-04-05 DIAGNOSIS — D61818 Other pancytopenia: Secondary | ICD-10-CM | POA: Insufficient documentation

## 2018-04-05 MED ORDER — MEGESTROL ACETATE 40 MG PO TABS: 40.0000 mg | ORAL_TABLET | Freq: Every day | ORAL | 2 refills | Status: DC

## 2018-04-05 NOTE — Progress Notes (Signed)
Patient here for follow up. He has been falling lately at home , family states that it might be related to Rmeron. His appetite is poor.

## 2018-04-05 NOTE — Telephone Encounter (Signed)
OK for him to not take the remeron any more

## 2018-04-06 ENCOUNTER — Telehealth: Payer: Self-pay

## 2018-04-06 NOTE — Telephone Encounter (Signed)
Nutrition  Received message patient requesting another case of ensure.  Called patient this am and left voice mail that 3rd case of ensure enlive will be left at front desk of cancer center for him to pick up.   RD will follow-up via phone at later date.    Emberlie Gotcher B. Zenia Resides, [place], [place] Registered Dietitian [phone] (pager)

## 2018-04-07 ENCOUNTER — Encounter: Payer: Medicare Other | Admitting: Physician Assistant

## 2018-04-07 DIAGNOSIS — E1151 Type 2 diabetes mellitus with diabetic peripheral angiopathy without gangrene: Secondary | ICD-10-CM | POA: Diagnosis not present

## 2018-04-07 DIAGNOSIS — L98492 Non-pressure chronic ulcer of skin of other sites with fat layer exposed: Secondary | ICD-10-CM | POA: Diagnosis not present

## 2018-04-07 DIAGNOSIS — L89322 Pressure ulcer of left buttock, stage 2: Secondary | ICD-10-CM | POA: Diagnosis not present

## 2018-04-07 DIAGNOSIS — C9000 Multiple myeloma not having achieved remission: Secondary | ICD-10-CM | POA: Diagnosis not present

## 2018-04-07 DIAGNOSIS — I10 Essential (primary) hypertension: Secondary | ICD-10-CM | POA: Diagnosis not present

## 2018-04-07 DIAGNOSIS — I890 Lymphedema, not elsewhere classified: Secondary | ICD-10-CM | POA: Diagnosis not present

## 2018-04-07 DIAGNOSIS — E039 Hypothyroidism, unspecified: Secondary | ICD-10-CM | POA: Diagnosis not present

## 2018-04-07 DIAGNOSIS — Z87891 Personal history of nicotine dependence: Secondary | ICD-10-CM | POA: Diagnosis not present

## 2018-04-09 NOTE — Progress Notes (Signed)
Martin, [name] ([id number]) Visit Report for 04/07/2018 Arrival Information Details Patient Name: [name], [name] Date of Service: 04/07/2018 8:45 AM Medical Record Number: [id number] Patient Account Number: [id number] Date of Birth/Sex: November 19, 1940 (77 y.o. M) Treating RN: Secundino Ginger Primary Care Anwita Mencer: Park Liter Other Clinician: Referring Sadiq Mccauley: Park Liter Treating Keean Wilmeth/Extender: Melburn Hake, HOYT Weeks in Treatment: 1 Visit Information History Since Last Visit Added or deleted any medications: No Patient Arrived: Walker Any new allergies or adverse reactions: No Arrival Time: 08:57 Had a fall or experienced change in No Accompanied By: son activities of daily living that may affect Transfer Assistance: None risk of falls: Patient Identification Verified: Yes Signs or symptoms of abuse/neglect since last visito No Secondary Verification Process Completed: Yes Hospitalized since last visit: No Pain Present Now: No Electronic Signature(s) Signed: 04/07/2018 1:01:46 PM By: Secundino Ginger Entered By: Secundino Ginger on 04/07/2018 08:57:31 Ceesay, Donnie Coffin ([id number]) -------------------------------------------------------------------------------- Encounter Discharge Information Details Patient Name: Martin, Orren C. Date of Service: 04/07/2018 8:45 AM Medical Record Number: [id number] Patient Account Number: [id number] Date of Birth/Sex: 1941-07-08 (77 y.o. M) Treating RN: Cornell Barman Primary Care Monico Sudduth: Park Liter Other Clinician: Referring Meghana Tullo: Park Liter Treating Berniece Abid/Extender: Melburn Hake, HOYT Weeks in Treatment: 1 Encounter Discharge Information Items Discharge Condition: Stable Ambulatory Status: Ambulatory Discharge Destination: Home Transportation: Private Auto Accompanied By: self Schedule Follow-up Appointment: Yes Clinical Summary of Care: Electronic Signature(s) Signed: 04/07/2018 4:55:51 PM By: Gretta Cool, BSN, RN, CWS, Kim RN, BSN Entered By:  Gretta Cool, BSN, RN, CWS, Kim on 04/07/2018 09:42:26 Mahmood, Donnie Coffin ([id number]) -------------------------------------------------------------------------------- Lower Extremity Assessment Details Patient Name: Martin, Caellum C. Date of Service: 04/07/2018 8:45 AM Medical Record Number: [id number] Patient Account Number: [id number] Date of Birth/Sex: 09-06-1941 (77 y.o. M) Treating RN: Secundino Ginger Primary Care Aloura Matsuoka: Park Liter Other Clinician: Referring Natalie Leclaire: Park Liter Treating Ginnie Marich/Extender: Melburn Hake, HOYT Weeks in Treatment: 1 Electronic Signature(s) Signed: 04/07/2018 1:01:46 PM By: Secundino Ginger Entered By: Secundino Ginger on 04/07/2018 08:58:11 Cage, Levaughn CMarland Kitchen ([id number]) -------------------------------------------------------------------------------- Multi Wound Chart Details Patient Name: Martin, Marice C. Date of Service: 04/07/2018 8:45 AM Medical Record Number: [id number] Patient Account Number: [id number] Date of Birth/Sex: Apr 11, 1941 (77 y.o. 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:Pressure Ulcer] [4:Skin Tear] [N/A:N/A] Comorbid History: [3:Type II Diabetes] [4:Type II Diabetes] [N/A:N/A] Date Acquired: [3:03/17/2018] [4:04/03/2018] [N/A:N/A] Weeks of Treatment: [3:1] [4:0] [N/A:N/A] Wound Status: [3:Open] [4:Open] [N/A:N/A] Measurements L x W x D [3:1x0.9x0.1] [4:3.5x2.2x0.1] [N/A:N/A] (cm) Area (cm) : [9:3.716] [4:6.048] [N/A:N/A] Volume (cm) : [3:0.071] [4:0.605]  [N/A:N/A] % Reduction in Area: [3:-12.60%] [4:0.00%] [N/A:N/A] % Reduction in Volume: [3:-12.70%] [4:0.00%] [N/A:N/A] Classification: [3:Category/Stage II] [4:Partial Thickness] [N/A:N/A] Exudate Amount: [3:None Present] [4:Medium] [N/A:N/A] Exudate Type: [3:N/A] [4:Serosanguineous] [N/A:N/A] Exudate Color: [3:N/A] [4:red, brown] [N/A:N/A] Wound Margin: [3:Flat and Intact] [4:Flat and Intact] [N/A:N/A] Granulation Amount: [3:None Present (0%)] [4:Small (1-33%)] [N/A:N/A] Granulation Quality: [3:N/A] [4:Red] [N/A:N/A] Necrotic Amount: [3:Large (67-100%)] [4:Large (67-100%)] [N/A:N/A] Necrotic Tissue: [3:N/A] [4:Eschar] [N/A:N/A] Exposed Structures: [3:Fascia: No Fat Layer (Subcutaneous Tissue) Exposed: No Tendon: No Muscle: No Joint: No Bone: No] [4:Fascia: No Fat Layer (Subcutaneous Tissue) Exposed: No Tendon: No Muscle: No Joint: No Bone: No] [N/A:N/A] Epithelialization: [  3:None] [4:Medium (34-66%)] [N/A:N/A] Periwound Skin Texture: [3:Excoriation: No Induration: No Callus: No Crepitus: No Rash: No Scarring: No] [4:Excoriation: No Induration: No Callus: No Crepitus: No Rash: No Scarring: No] [N/A:N/A] Periwound Skin Moisture: Maceration: No Maceration: No N/A Dry/Scaly: No Dry/Scaly: No Periwound Skin Color: Erythema: Yes Atrophie Blanche: No N/A Atrophie Blanche: No Cyanosis: No Cyanosis: No Ecchymosis: No Ecchymosis: No Erythema: No Hemosiderin Staining: No Hemosiderin Staining: No Mottled: No Mottled: No Pallor: No Pallor: No Rubor: No Rubor: No Erythema Location: Circumferential N/A N/A Temperature: No Abnormality N/A N/A Tenderness on Palpation: Yes No N/A Wound Preparation: Ulcer Cleansing: Ulcer Cleansing: N/A Rinsed/Irrigated with Saline Rinsed/Irrigated with Saline Topical Anesthetic Applied: Other: hurricaine spray Treatment Notes Electronic Signature(s) Signed: 04/07/2018 4:59:39 PM By: Montey Hora Entered By: Montey Hora on 04/07/2018  09:18:24 Mauri, Donnie Coffin ([id number]) -------------------------------------------------------------------------------- Multi-Disciplinary Care Plan Details Patient Name: [name]. Date of Service: 04/07/2018 8:45 AM Medical Record Number: [id number] Patient Account Number: [id number] Date of Birth/Sex: 1941-04-16 (77 y.o. M) Treating RN: Montey Hora Primary Care Rolanda Campa: Park Liter Other Clinician: Referring Sharonica Kraszewski: Park Liter Treating Srinika Delone/Extender: Melburn Hake, HOYT Weeks in Treatment: 1 Active Inactive ` Abuse / Safety / Falls / Self Care Management Nursing Diagnoses: Impaired physical mobility Goals: Patient will not develop complications from immobility Date Initiated: 03/31/2018 Target Resolution Date: 06/17/2018 Goal Status: Active Interventions: Assess fall risk on admission and as needed Notes: ` Nutrition Nursing Diagnoses: Imbalanced nutrition Goals: Patient/caregiver agrees to and verbalizes understanding of need to use nutritional supplements and/or vitamins as prescribed Date Initiated: 03/31/2018 Target Resolution Date: 06/17/2018 Goal Status: Active Interventions: Assess patient nutrition upon admission and as needed per policy Notes: ` Orientation to the Wound Care Program Nursing Diagnoses: Knowledge deficit related to the wound healing center program Goals: Patient/caregiver will verbalize understanding of the [facility] Date Initiated: 03/31/2018 Target Resolution Date: 06/17/2018 Goal Status: Active Interventions: KAMERIN, GRUMBINE ([id number]) Provide education on orientation to the wound center Notes: ` Pressure Nursing Diagnoses: Knowledge deficit related to causes and risk factors for pressure ulcer development Goals: Patient will remain free from development of additional pressure ulcers Date Initiated: 03/31/2018 Target Resolution Date: 06/17/2018 Goal Status: Active Interventions: Assess: immobility,  friction, shearing, incontinence upon admission and as needed Notes: ` Wound/Skin Impairment Nursing Diagnoses: Impaired tissue integrity Goals: Ulcer/skin breakdown will heal within 14 weeks Date Initiated: 03/31/2018 Target Resolution Date: 06/17/2018 Goal Status: Active Interventions: Assess patient/caregiver ability to obtain necessary supplies Assess patient/caregiver ability to perform ulcer/skin care regimen upon admission and as needed Assess ulceration(s) every visit Notes: Electronic Signature(s) Signed: 04/07/2018 4:59:39 PM By: Montey Hora Entered By: Montey Hora on 04/07/2018 09:18:00 Digangi, Skyelar C. ([id number]) -------------------------------------------------------------------------------- Pain Assessment Details Patient Name: [name]. Date of Service: 04/07/2018 8:45 AM Medical Record Number: [id number] Patient Account Number: [id number] Date of Birth/Sex: 1941-05-18 (77 y.o. M) Treating RN: Montey Hora Primary Care Graiden Henes: Park Liter Other Clinician: Referring Rushie Brazel: Park Liter Treating August Gosser/Extender: Melburn Hake, HOYT Weeks in Treatment: 1 Active Problems Location of Pain Severity and Description of Pain Patient Has Paino No Site Locations Pain Management and Medication Current Pain Management: Electronic Signature(s) Signed: 04/07/2018 4:59:39 PM By: Montey Hora Entered By: Montey Hora on 04/07/2018 09:15:53 Mckinney, Donnie Coffin ([id number]) -------------------------------------------------------------------------------- Patient/Caregiver Education Details Patient Name: [name]. Date of Service: 04/07/2018 8:45 AM Medical Record Number: [id number] Patient Account Number: [id number] Date of Birth/Gender: 03/17/1941 (77 y.o. M) Treating RN: Cornell Barman Primary Care Physician: Park Liter Other Clinician:  Referring Physician: Park Liter Treating Physician/Extender: Sharalyn Ink in Treatment: 1 Education  Assessment Education Provided To: Patient Education Topics Provided Wound/Skin Impairment: Handouts: Caring for Your Ulcer Methods: Demonstration, Explain/Verbal Responses: State content correctly Electronic Signature(s) Signed: 04/07/2018 4:55:51 PM By: Gretta Cool, BSN, RN, CWS, Kim RN, BSN Entered By: Gretta Cool, BSN, RN, CWS, Kim on 04/07/2018 09:42:48 Critcher, Donnie Coffin ([id number]) -------------------------------------------------------------------------------- Wound Assessment Details Patient Name: Hepler, Khaliq C. Date of Service: 04/07/2018 8:45 AM Medical Record Number: [id number] Patient Account Number: [id number] Date of Birth/Sex: 11-Aug-1941 (77 y.o. M) Treating RN: Secundino Ginger Primary Care Josehua Hammar: Park Liter Other Clinician: Referring Batina Dougan: Park Liter Treating Terryn Redner/Extender: Melburn Hake, HOYT Weeks in Treatment: 1 Wound Status Wound Number: 3 Primary Etiology: Pressure Ulcer Wound Location: Left Gluteus Wound Status: Open Wounding Event: Pressure Injury Comorbid History: Type II Diabetes Date Acquired: 03/17/2018 Weeks Of Treatment: 1 Clustered Wound: No Photos Photo Uploaded By: Secundino Ginger on 04/07/2018 09:26:17 Wound Measurements Length: (cm) 1 % Reducti Width: (cm) 0.9 % Reducti Depth: (cm) 0.1 Epithelia Area: (cm) 0.707 Tunnelin Volume: (cm) 0.071 Undermin on in Area: -12.6% on in Volume: -12.7% lization: None g: No ing: No Wound Description Classification: Category/Stage II Foul Odo Wound Margin: Flat and Intact Slough/F Exudate Amount: None Present r After Cleansing: No ibrino Yes Wound Bed Granulation Amount: None Present (0%) Exposed Structure Necrotic Amount: Large (67-100%) Fascia Exposed: No Fat Layer (Subcutaneous Tissue) Exposed: No Tendon Exposed: No Muscle Exposed: No Joint Exposed: No Bone Exposed: No Periwound Skin Texture Texture Color No Abnormalities Noted: No No Abnormalities Noted: No Callus: No Atrophie Blanche:  No Crepitus: No Cyanosis: No Excoriation: No Ecchymosis: No Eskridge, Marshun C. ([id number]) Induration: No Erythema: Yes Rash: No Erythema Location: Circumferential Scarring: No Hemosiderin Staining: No Mottled: No Moisture Pallor: No No Abnormalities Noted: No Rubor: No Dry / Scaly: No Maceration: No Temperature / Pain Temperature: No Abnormality Tenderness on Palpation: Yes Wound Preparation Ulcer Cleansing: Rinsed/Irrigated with Saline Treatment Notes Wound #3 (Left Gluteus) 1. Cleansed with: Clean wound with Normal Saline 2. Anesthetic Topical Lidocaine 4% cream to wound bed prior to debridement 4. Dressing Applied: Prisma Ag 5. Secondary Dressing Applied Bordered Foam Dressing Electronic Signature(s) Signed: 04/07/2018 1:01:46 PM By: Secundino Ginger Entered By: Secundino Ginger on 04/07/2018 09:06:01 Vandergrift, Zakariya CMarland Kitchen ([id number]) -------------------------------------------------------------------------------- Wound Assessment Details Patient Name: Dern, Elgie C. Date of Service: 04/07/2018 8:45 AM Medical Record Number: [id number] Patient Account Number: [id number] Date of Birth/Sex: June 28, 1941 (77 y.o. M) Treating RN: Montey Hora Primary Care Sonya Gunnoe: Park Liter Other Clinician: Referring Sharman Garrott: Park Liter Treating Tashara Suder/Extender: Melburn Hake, HOYT Weeks in Treatment: 1 Wound Status Wound Number: 4 Primary Etiology: Skin Tear Wound Location: Left Forearm Wound Status: Open Wounding Event: Trauma Comorbid History: Type II Diabetes Date Acquired: 04/03/2018 Weeks Of Treatment: 0 Clustered Wound: No Photos Photo Uploaded By: Secundino Ginger on 04/07/2018 09:26:17 Wound Measurements Length: (cm) 3.5 Width: (cm) 2.2 Depth: (cm) 0.1 Area: (cm) 6.048 Volume: (cm) 0.605 % Reduction in Area: 0% % Reduction in Volume: 0% Epithelialization: Medium (34-66%) Tunneling: No Undermining: No Wound Description Classification: Partial Thickness Foul Odo Wound  Margin: Flat and Intact Slough/F Exudate Amount: Medium Exudate Type: Serosanguineous Exudate Color: red, brown r After Cleansing: No ibrino No Wound Bed Granulation Amount: Small (1-33%) Exposed Structure Granulation Quality: Red Fascia Exposed: No Necrotic Amount: Large (67-100%) Fat Layer (Subcutaneous Tissue) Exposed: No Necrotic Quality: Eschar Tendon Exposed: No Muscle Exposed: No Joint Exposed: No Bone Exposed: No Periwound Skin Texture Texture Color  No Abnormalities Noted: No No Abnormalities Noted: No Callus: No Atrophie Blanche: No [place], Demetrio C. ([id number]) Crepitus: No Cyanosis: No Excoriation: No Ecchymosis: No Induration: No Erythema: No Rash: No Hemosiderin Staining: No Scarring: No Mottled: No Pallor: No Moisture Rubor: No No Abnormalities Noted: No Dry / Scaly: No Maceration: No Wound Preparation Ulcer Cleansing: Rinsed/Irrigated with Saline Topical Anesthetic Applied: Other: hurricaine spray, Treatment Notes Wound #4 (Left Forearm) 1. Cleansed with: Clean wound with Normal Saline 2. Anesthetic Other (specify in notes) 4. Dressing Applied: Xeroform 5. Secondary [place] Notes Benzocaine; secured with conform Electronic Signature(s) Signed: 04/07/2018 4:59:39 PM By: Montey Hora Entered By: Montey Hora on 04/07/2018 09:17:52 Iribe, Donnie Coffin ([id number]) -------------------------------------------------------------------------------- [place] Details Patient Name: [name]. Date of Service: 04/07/2018 8:45 AM Medical Record Number: [id number] Patient Account Number: [id number] Date of Birth/Sex: October 31, 1940 (77 y.o. M) Treating RN: Secundino Ginger Primary Care Azzure Garabedian: Park Liter Other Clinician: Referring Dominion Kathan: Park Liter Treating Gilma Bessette/Extender: Melburn Hake, HOYT Weeks in Treatment: 1 Vital Signs Time Taken: 08:50 Temperature (F): 98.4 Height (in): 67 Pulse (bpm): 90 Weight (lbs):  158 Respiratory Rate (breaths/min): 18 Body Mass Index (BMI): 24.7 Blood Pressure (mmHg): 124/39 Reference Range: 80 - 120 mg / dl Notes manual BP 152/58 Electronic Signature(s) Signed: 04/07/2018 4:59:39 PM By: Montey Hora Entered By: Montey Hora on 04/07/2018 09:26:55

## 2018-04-10 DIAGNOSIS — N181 Chronic kidney disease, stage 1: Secondary | ICD-10-CM | POA: Diagnosis not present

## 2018-04-10 DIAGNOSIS — I129 Hypertensive chronic kidney disease with stage 1 through stage 4 chronic kidney disease, or unspecified chronic kidney disease: Secondary | ICD-10-CM | POA: Diagnosis not present

## 2018-04-10 DIAGNOSIS — C9000 Multiple myeloma not having achieved remission: Secondary | ICD-10-CM | POA: Diagnosis not present

## 2018-04-10 DIAGNOSIS — E1140 Type 2 diabetes mellitus with diabetic neuropathy, unspecified: Secondary | ICD-10-CM | POA: Diagnosis not present

## 2018-04-10 DIAGNOSIS — E1165 Type 2 diabetes mellitus with hyperglycemia: Secondary | ICD-10-CM | POA: Diagnosis not present

## 2018-04-10 DIAGNOSIS — E538 Deficiency of other specified B group vitamins: Secondary | ICD-10-CM | POA: Diagnosis not present

## 2018-04-10 DIAGNOSIS — E039 Hypothyroidism, unspecified: Secondary | ICD-10-CM | POA: Diagnosis not present

## 2018-04-10 DIAGNOSIS — E1122 Type 2 diabetes mellitus with diabetic chronic kidney disease: Secondary | ICD-10-CM | POA: Diagnosis not present

## 2018-04-11 ENCOUNTER — Encounter: Payer: Medicare Other | Attending: Physician Assistant | Admitting: Physician Assistant

## 2018-04-11 DIAGNOSIS — E1165 Type 2 diabetes mellitus with hyperglycemia: Secondary | ICD-10-CM | POA: Diagnosis not present

## 2018-04-11 DIAGNOSIS — Z87891 Personal history of nicotine dependence: Secondary | ICD-10-CM | POA: Diagnosis not present

## 2018-04-11 DIAGNOSIS — E11622 Type 2 diabetes mellitus with other skin ulcer: Secondary | ICD-10-CM | POA: Insufficient documentation

## 2018-04-11 DIAGNOSIS — E538 Deficiency of other specified B group vitamins: Secondary | ICD-10-CM | POA: Diagnosis not present

## 2018-04-11 DIAGNOSIS — C9000 Multiple myeloma not having achieved remission: Secondary | ICD-10-CM | POA: Diagnosis not present

## 2018-04-11 DIAGNOSIS — E039 Hypothyroidism, unspecified: Secondary | ICD-10-CM | POA: Diagnosis not present

## 2018-04-11 DIAGNOSIS — E1151 Type 2 diabetes mellitus with diabetic peripheral angiopathy without gangrene: Secondary | ICD-10-CM | POA: Diagnosis not present

## 2018-04-11 DIAGNOSIS — I129 Hypertensive chronic kidney disease with stage 1 through stage 4 chronic kidney disease, or unspecified chronic kidney disease: Secondary | ICD-10-CM | POA: Diagnosis not present

## 2018-04-11 DIAGNOSIS — I890 Lymphedema, not elsewhere classified: Secondary | ICD-10-CM | POA: Insufficient documentation

## 2018-04-11 DIAGNOSIS — N181 Chronic kidney disease, stage 1: Secondary | ICD-10-CM | POA: Diagnosis not present

## 2018-04-11 DIAGNOSIS — L97919 Non-pressure chronic ulcer of unspecified part of right lower leg with unspecified severity: Secondary | ICD-10-CM | POA: Insufficient documentation

## 2018-04-11 DIAGNOSIS — E1122 Type 2 diabetes mellitus with diabetic chronic kidney disease: Secondary | ICD-10-CM | POA: Diagnosis not present

## 2018-04-11 DIAGNOSIS — L89322 Pressure ulcer of left buttock, stage 2: Secondary | ICD-10-CM | POA: Diagnosis not present

## 2018-04-11 DIAGNOSIS — E1140 Type 2 diabetes mellitus with diabetic neuropathy, unspecified: Secondary | ICD-10-CM | POA: Diagnosis not present

## 2018-04-11 DIAGNOSIS — L89323 Pressure ulcer of left buttock, stage 3: Secondary | ICD-10-CM | POA: Insufficient documentation

## 2018-04-11 NOTE — Progress Notes (Signed)
JEWETT, MCGANN ([id number]) Visit Report for 04/07/2018 Chief Complaint Document Details Patient Name: [name], [name] Date of Service: 04/07/2018 8:45 AM Medical Record Number: [id number] Patient Account Number: [id number] Date of Birth/Sex: [birth year] (76 y.o. M) Treating RN: [name] Primary Care Provider: Park [name] Other Clinician: Referring Provider: Park [name] Treating Provider/Extender: [name], Martin Weeks in Treatment: 1 Information Obtained from: Patient Chief Complaint Left gluteal ulcer Electronic Signature(s) Signed: 04/08/2018 12:34:34 PM By: Worthy Keeler [name] Entered By: Worthy Keeler on 04/07/2018 09:09:42 [name], [name] ([id number]) -------------------------------------------------------------------------------- Debridement Details Patient Name: [name] Date of Service: 04/07/2018 8:45 AM Medical Record Number: [id number] Patient Account Number: [id number] Date of Birth/Sex: 1941/03/13 (76 y.o. M) Treating RN: [name] Primary Care Provider: Park [name] Other Clinician: Referring Provider: Park [name] Treating Provider/Extender: [name], Martin Weeks in Treatment: 1 Debridement Performed for Wound #4 Left Forearm Assessment: Performed By: Physician STONE III, [name] Debridement Type: Debridement Pre-procedure Verification/Time Yes - 09:22 Out Taken: Start Time: 09:22 Pain Control: Lidocaine 4% Topical Solution Total Area Debrided (L x W): 3.5 (cm) x 2.2 (cm) = 7.7 (cm) Tissue and other material Viable, Non-Viable, Blood Clots, Eschar, Subcutaneous debrided: Level: Skin/Subcutaneous Tissue Debridement Description: Excisional Instrument: Forceps, Scissors Bleeding: Minimum Hemostasis Achieved: Pressure End Time: 09:24 Procedural Pain: 0 Post Procedural Pain: 0 Response to Treatment: Procedure was tolerated well Level of Consciousness: Awake and Alert Post Procedure Vitals: Temperature: 98.4 Pulse:  90 Respiratory Rate: 16 Blood Pressure: Systolic Blood Pressure: 443 Diastolic Blood Pressure: 39 Post Debridement Measurements of Total Wound Length: (cm) 3.5 Width: (cm) 2.2 Depth: (cm) 0.1 Volume: (cm) 0.605 Character of Wound/Ulcer Post Debridement: Improved Post Procedure Diagnosis Same as Pre-procedure Electronic Signature(s) Signed: 04/07/2018 4:59:39 PM By: [name] Signed: 04/08/2018 12:34:34 PM By: Worthy Keeler [name] Entered By: [name] on 04/07/2018 09:24:30 [name], [name] ([id number]) -------------------------------------------------------------------------------- HPI Details Patient Name: [name] Date of Service: 04/07/2018 8:45 AM Medical Record Number: [id number] Patient Account Number: [id number] Date of Birth/Sex: [birth year] (76 y.o. M) Treating RN: [name] Primary Care Provider: Park [name] Other Clinician: Referring Provider: Park [name] Treating Provider/Extender: [name], Martin Weeks in Treatment: 1 History of Present Illness HPI Description: 07/08/17 on evaluation today patient presents initially for issues that he has been having with bilateral lower extremities this has been going on for the best I can tell "about a month" he has been seen by his primary care provider at [facility] family practice and they have been performing wraps. They also did get him set up with home health although there was confusion about what company was providing the home health for him. Unfortunately patient with a poor historian and it was difficult to gather information about how this has really progressed over the past month. Nonetheless he does have erythema noted of the right lower extremity and ulceration at this location as well. He has what appears to be lymphedema noted of the bilateral lower extremities and the left forefoot region is macerated potentially with a fungal infection noted as well. No fevers, chills, nausea, or vomiting  noted at this time. As best I can gather patient has a history of hypothyroidism, hypertension, medicinal and antinausea medication he tells me due to the fact that he has been on the antibiotic. 07/22/2017 -- I'm reviewing this 77 year old gentleman for the first time today, and notes that he was recently in hospital but he got himself discharge AMA, and during this time he was  treated for acute hyponatremia, cellulitis and bilateral leg ulceration with a history of alcohol abuse. The patient's hemoglobin A1c was 5.1 the last time he was in hospital and he was also noted to have cellulitis of the lower extremity and since he could not tolerate Augmentin was started on Bactrim. the patient and his son both have limited understanding and knowledge and it seems quite difficult to make them understand the treatment plan 08/05/17 on evaluation today patient's wound appears to be healed and he is doing very well. Unfortunately we have been unable to obtain the compression stockings for him at this point. We will discuss it further and plan. Otherwise he has no other concerns. Readmission: 03/31/18 on evaluation today patient is seen back in our clinic for a different issue than what we previously have evaluated him for. 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 He states this only seems to hurt when he sits and unfortunately he tells me that he sits the majority of his day in his chair. I did ask him about whether or not he had accounts that he can [place] he states he does but he never lays on it. He does have multiple myeloma and is currently under treatment for this. With that being said he also tells [place] that he just eats peanut butter sandwiches and boiled eggs each day and drinks beer. When directly asked if each anything else he says no. Nonetheless it does appear that the wound itself is fairly superficial and I think could  actually heal very nicely with appropriate treatment and therapy. 04/07/18 on evaluation today patient actually appears to be doing about the same in regard to the sacral ulcer. In regard to his ulcer on the left forearm which is new and secondary to fall this appears to be skin tear. He does have some issues with balance specifically related to a recent medication that he was placed on. The good news is this was discontinued a week ago nonetheless he did have a fall which was about a week ago causing the skin tear. There is some hematoma underneath the flap which is actually necrotic and does not appear to be reattaching. Electronic Signature(s) Signed: 04/08/2018 12:34:34 PM By: Worthy Keeler [name] Entered By: Worthy Keeler on 04/08/2018 11:13:55 Yount, [name] ([id number]) -------------------------------------------------------------------------------- Physical Exam Details Patient Name: Schwier, [name] Date of Service: 04/07/2018 8:45 AM Medical Record Number: [id number] Patient Account Number: [id number] Date of Birth/Sex: Apr 03, 1941 (76 y.o. M) Treating RN: [name] Primary Care Provider: Park [name] Other Clinician: Referring Provider: Park [name] Treating Provider/Extender: [name], Martin Weeks in Treatment: 1 Constitutional Chronically ill appearing but in no apparent acute distress. Respiratory normal breathing without difficulty. clear to auscultation bilaterally. Cardiovascular regular rate and rhythm with normal S1, S2. Psychiatric this patient is able to make decisions and demonstrates good insight into disease process. Alert and Oriented x 3. pleasant and cooperative. Notes At this point patient's wound bed actually appears to be doing fairly well which is excellent news. In general I do not see a big shift from last week but it's definitely not worst he still does seem to be getting pressure to the area I again reiterated that he needs to try to keep  pressure off of this region as much as possible. I did sharply debride away the necrotic skin tear flap where there was a hematoma underneath and post debridement  the wound bed appears to be doing much better he tolerated this without significant pain. Electronic Signature(s) Signed: 04/08/2018 12:34:34 PM By: Worthy Keeler [name] Entered By: Worthy Keeler on 04/08/2018 11:15:01 Rendell, [name] ([id number]) -------------------------------------------------------------------------------- Physician Orders Details Patient Name: [name] Date of Service: 04/07/2018 8:45 AM Medical Record Number: [id number] Patient Account Number: [id number] Date of Birth/Sex: 10/12/1940 (76 y.o. M) Treating RN: [name] Primary Care Provider: Park [name] Other Clinician: Referring Provider: Park [name] Treating Provider/Extender: [name], Martin Weeks in Treatment: 1 Verbal / Phone Orders: No Diagnosis Coding ICD-10 Coding Code Description I89.0 Lymphedema, not elsewhere classified I73.9 Peripheral vascular disease, unspecified L89.322 Pressure ulcer of left buttock, stage 2 C90.00 Multiple myeloma not having achieved remission Wound Cleansing Wound #3 Left Gluteus o Clean wound with Normal Saline. o May Shower, gently pat wound dry prior to applying new dressing. Wound #4 Left Forearm o Clean wound with Normal Saline. o May Shower, gently pat wound dry prior to applying new dressing. Anesthetic (add to Medication List) Wound #3 Left Gluteus o Topical Lidocaine 4% cream applied to wound bed prior to debridement (In Clinic Only). Wound #4 Left Forearm o Topical Lidocaine 4% cream applied to wound bed prior to debridement (In Clinic Only). Primary Wound Dressing Wound #3 Left Gluteus o Silver Collagen - in clinic o Xeroform - HHRN to use xeroform if patient runs out of silver collagen that was given to him from clinic Wound #4 Left Forearm o Xeroform Secondary  Dressing Wound #3 Left Gluteus o Boardered Foam Dressing Wound #4 Left Forearm o Conform/Kerlix o Non-adherent pad Dressing Change Frequency Wound #3 Left Gluteus o Change Dressing Monday, Wednesday, Friday [place] ([id number]) Wound #4 Left Forearm o Change Dressing Monday, Wednesday, Friday Follow-up Appointments Wound #3 Left Gluteus o Return Appointment in 1 week. Wound #4 Left Forearm o Return Appointment in 1 week. Off-Loading Wound #3 Left Gluteus o Turn and reposition every 2 hours Additional Orders / Instructions Wound #3 Left Gluteus o Vitamin A; Vitamin [name], Zinc - Please add a multivitamin that has vitamin A, vitamin [name] and zinc in it o Increase protein intake. o Activity as tolerated Wound #4 Left Forearm o Vitamin A; Vitamin [name], Zinc - Please add a multivitamin that has vitamin A, vitamin [name] and zinc in it o Increase protein intake. o Activity as tolerated Home Health Wound #3 Left [place] Nurse may visit PRN to address patientos wound care needs. o FACE TO FACE ENCOUNTER: MEDICARE and MEDICAID PATIENTS: I certify that this patient is under my care and that I had a face-to-face encounter that meets the physician face-to-face encounter requirements with this patient on this date. The encounter with the patient was in whole or in part for the following MEDICAL CONDITION: (primary reason for [place]) MEDICAL NECESSITY: I certify, that based on my findings, NURSING services are a medically necessary home health service. HOME BOUND STATUS: I certify that my clinical findings support that this patient is homebound (i.e., Due to illness or injury, pt requires aid of supportive devices such as crutches, cane, wheelchairs, walkers, the use of special transportation or the assistance of another person to leave their place of residence. There is a normal inability to leave the home and  doing so requires considerable and taxing effort. Other absences are for medical reasons / religious services and are infrequent or of short duration when for other reasons). o If current  dressing causes regression in wound condition, may D/[name] ordered dressing product/s and apply Normal Saline Moist Dressing daily until next [place] / Other MD appointment. [place] of regression in wound condition at [phone]. o Please direct any NON-WOUND related issues/requests for orders to patient's Primary Care Physician Wound #4 Left Forearm o [place] Nurse may visit PRN to address patientos wound care needs. o FACE TO FACE ENCOUNTER: MEDICARE and MEDICAID PATIENTS: I certify that this patient is under my care and that I had a face-to-face encounter that meets the physician face-to-face encounter requirements with this patient on this date. The encounter with the patient was in whole or in part for the following MEDICAL CONDITION: (primary reason for [place]) MEDICAL NECESSITY: I certify, that based on my findings, NURSING services are a medically necessary home health service. HOME BOUND STATUS: I certify that my clinical findings support that this patient is homebound (i.e., Due to illness or injury, pt requires aid of supportive devices such as crutches, cane, wheelchairs, walkers, the use of special transportation or the assistance of another person to leave their place of residence. There is a normal inability to leave the home [place], Martin Run. ([id number]) and doing so requires considerable and taxing effort. Other absences are for medical reasons / religious services and are infrequent or of short duration when for other reasons). o If current dressing causes regression in wound condition, may D/[name] ordered dressing product/s and apply Normal Saline Moist Dressing daily until next [place] / Other MD  appointment. [place] of regression in wound condition at [phone]. o Please direct any NON-WOUND related issues/requests for orders to patient's Primary Care Physician Electronic Signature(s) Signed: 04/07/2018 4:59:39 PM By: [name] Signed: 04/08/2018 12:34:34 PM By: Worthy Keeler [name] Entered By: [name] on 04/07/2018 09:25:44 Kleckley, Martin CMarland Kitchen ([id number]) -------------------------------------------------------------------------------- Problem List Details Patient Name: Turner, [name] Date of Service: 04/07/2018 8:45 AM Medical Record Number: [id number] Patient Account Number: [id number] Date of Birth/Sex: 04-Jun-1941 (76 y.o. M) Treating RN: [name] Primary Care Provider: Park [name] Other Clinician: Referring Provider: Park [name] Treating Provider/Extender: [name], Martin Weeks in Treatment: 1 Active Problems ICD-10 Evaluated Encounter Code Description Active Date Today Diagnosis I89.0 Lymphedema, not elsewhere classified 03/31/2018 No Yes I73.9 Peripheral vascular disease, unspecified 03/31/2018 No Yes S41.102A Unspecified open wound of left upper arm, initial encounter 04/08/2018 No Yes L89.322 Pressure ulcer of left buttock, stage 2 03/31/2018 No Yes C90.00 Multiple myeloma not having achieved remission 03/31/2018 No Yes Inactive Problems Resolved Problems Electronic Signature(s) Signed: 04/08/2018 12:34:34 PM By: Worthy Keeler [name] Entered By: Worthy Keeler on 04/08/2018 11:16:18 Bourcier, [name] ([id number]) -------------------------------------------------------------------------------- Progress Note Details Patient Name: [name] Date of Service: 04/07/2018 8:45 AM Medical Record Number: [id number] Patient Account Number: [id number] Date of Birth/Sex: [birth year] (76 y.o. M) Treating RN: [name] Primary Care Provider: Park [name] Other Clinician: Referring Provider: Park [name] Treating  Provider/Extender: [name], Martin Weeks in Treatment: 1 Subjective Chief Complaint Information obtained from Patient Left gluteal ulcer History of Present Illness (HPI) 07/08/17 on evaluation today patient presents initially for issues that he has been having with bilateral lower extremities this has been going on for the best I can tell "about a month" he has been seen by his primary care provider at [facility] family practice and they have been performing wraps. They also did get him set up with home health  although there was confusion about what company was providing the home health for him. Unfortunately patient with a poor historian and it was difficult to gather information about how this has really progressed over the past month. Nonetheless he does have erythema noted of the right lower extremity and ulceration at this location as well. He has what appears to be lymphedema noted of the bilateral lower extremities and the left forefoot region is macerated potentially with a fungal infection noted as well. No fevers, chills, nausea, or vomiting noted at this time. As best I can gather patient has a history of hypothyroidism, hypertension, medicinal and antinausea medication he tells me due to the fact that he has been on the antibiotic. 07/22/2017 -- I'm reviewing this 77 year old gentleman for the first time today, and notes that he was recently in hospital but he got himself discharge AMA, and during this time he was treated for acute hyponatremia, cellulitis and bilateral leg ulceration with a history of alcohol abuse. The patient's hemoglobin A1c was 5.1 the last time he was in hospital and he was also noted to have cellulitis of the lower extremity and since he could not tolerate Augmentin was started on Bactrim. the patient and his son both have limited understanding and knowledge and it seems quite difficult to make them understand the treatment plan 08/05/17 on evaluation today  patient's wound appears to be healed and he is doing very well. Unfortunately we have been unable to obtain the compression stockings for him at this point. We will discuss it further and plan. Otherwise he has no other concerns. Readmission: 03/31/18 on evaluation today patient is seen back in our clinic for a different issue than what we previously have evaluated him for. 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 He states this only seems to hurt when he sits and unfortunately he tells me that he sits the majority of his day in his chair. I did ask him about whether or not he had accounts that he can [place] he states he does but he never lays on it. He does have multiple myeloma and is currently under treatment for this. With that being said he also tells [place] that he just eats peanut butter sandwiches and boiled eggs each day and drinks beer. When directly asked if each anything else he says no. Nonetheless it does appear that the wound itself is fairly superficial and I think could actually heal very nicely with appropriate treatment and therapy. 04/07/18 on evaluation today patient actually appears to be doing about the same in regard to the sacral ulcer. In regard to his ulcer on the left forearm which is new and secondary to fall this appears to be skin tear. He does have some issues with balance specifically related to a recent medication that he was placed on. The good news is this was discontinued a week ago nonetheless he did have a fall which was about a week ago causing the skin tear. There is some hematoma underneath the flap which is actually necrotic and does not appear to be reattaching. BREANNA, MCDANIEL ([id number]) Patient History Unable to Obtain Patient History due to Altered Mental Status. Information obtained from Patient. Social History Former smoker, Marital Status - Married, Alcohol Use - Daily, Drug  Use - No History, Caffeine Use - Rarely. Medical And Surgical History Notes Oncologic multiple myeloma Review of Systems (ROS) Constitutional Symptoms (General Health)  Denies complaints or symptoms of Fever, Chills. Respiratory The patient has no complaints or symptoms. Cardiovascular The patient has no complaints or symptoms. Psychiatric The patient has no complaints or symptoms. Objective Constitutional Chronically ill appearing but in no apparent acute distress. Vitals Time Taken: 8:50 AM, Height: 67 in, Weight: 158 lbs, BMI: 24.7, Temperature: 98.4 F, Pulse: 90 bpm, Respiratory Rate: 18 breaths/min, Blood Pressure: 124/39 mmHg. General Notes: manual BP 152/58 Respiratory normal breathing without difficulty. clear to auscultation bilaterally. Cardiovascular regular rate and rhythm with normal S1, S2. Psychiatric this patient is able to make decisions and demonstrates good insight into disease process. Alert and Oriented x 3. pleasant and cooperative. General Notes: At this point patient's wound bed actually appears to be doing fairly well which is excellent news. In general I do not see a big shift from last week but it's definitely not worst he still does seem to be getting pressure to the area I again reiterated that he needs to try to keep pressure off of this region as much as possible. I did sharply debride away the necrotic skin tear flap where there was a hematoma underneath and post debridement the wound bed appears to be doing much better he tolerated this without significant pain. Integumentary (Hair, Skin) Wound #3 status is Open. Original cause of wound was Pressure Injury. The wound is located on the Left Gluteus. The wound measures 1cm length x 0.9cm width x 0.1cm depth; 0.707cm^2 area and 0.071cm^3 volume. There is no tunneling or Pafford, [name] ([id number]) undermining noted. There is a none present amount of drainage noted. The wound margin is flat and  intact. There is no granulation within the wound bed. There is a large (67-100%) amount of necrotic tissue within the wound bed. The periwound skin appearance exhibited: Erythema. The periwound skin appearance did not exhibit: Callus, Crepitus, Excoriation, Induration, Rash, Scarring, Dry/Scaly, Maceration, Atrophie Blanche, Cyanosis, Ecchymosis, Hemosiderin Staining, Mottled, Pallor, Rubor. The surrounding wound skin color is noted with erythema which is circumferential. Periwound temperature was noted as No Abnormality. The periwound has tenderness on palpation. Wound #4 status is Open. Original cause of wound was Trauma. The wound is located on the Left Forearm. The wound measures 3.5cm length x 2.2cm width x 0.1cm depth; 6.048cm^2 area and 0.605cm^3 volume. There is no tunneling or undermining noted. There is a medium amount of serosanguineous drainage noted. The wound margin is flat and intact. There is small (1-33%) red granulation within the wound bed. There is a large (67-100%) amount of necrotic tissue within the wound bed including Eschar. The periwound skin appearance did not exhibit: Callus, Crepitus, Excoriation, Induration, Rash, Scarring, Dry/Scaly, Maceration, Atrophie Blanche, Cyanosis, Ecchymosis, Hemosiderin Staining, Mottled, Pallor, Rubor, Erythema. Assessment Active Problems ICD-10 Lymphedema, not elsewhere classified Peripheral vascular disease, unspecified Unspecified open wound of left upper arm, initial encounter Pressure ulcer of left buttock, stage 2 Multiple myeloma not having achieved remission Procedures Wound #4 Pre-procedure diagnosis of Wound #4 is a Skin Tear located on the Left Forearm . There was a Excisional Skin/Subcutaneous Tissue Debridement with a total area of 7.7 sq cm performed by STONE III, [name] With the following instrument(s): Forceps, and Scissors to remove Viable and Non-Viable tissue/material. Material removed includes Blood  Clots, Eschar, and Subcutaneous Tissue after achieving pain control using Lidocaine 4% Topical Solution. No specimens were taken. A time out was conducted at 09:22, prior to the start of the procedure. A Minimum amount of bleeding was controlled with Pressure. The  procedure was tolerated well with a pain level of 0 throughout and a pain level of 0 following the procedure. Patient s Level of Consciousness post procedure was recorded as Awake and Alert. Post Debridement Measurements: 3.5cm length x 2.2cm width x 0.1cm depth; 0.605cm^3 volume. Character of Wound/Ulcer Post Debridement is improved. Post procedure Diagnosis Wound #4: Same as Pre-Procedure Plan Wound Cleansing: Wound #3 Left Gluteus: Clean wound with Normal Saline. DOMINICO, ROD ([id number]) May Shower, gently pat wound dry prior to applying new dressing. Wound #4 Left Forearm: Clean wound with Normal Saline. May Shower, gently pat wound dry prior to applying new dressing. Anesthetic (add to Medication List): Wound #3 Left Gluteus: Topical Lidocaine 4% cream applied to wound bed prior to debridement (In Clinic Only). Wound #4 Left Forearm: Topical Lidocaine 4% cream applied to wound bed prior to debridement (In Clinic Only). Primary Wound Dressing: Wound #3 Left Gluteus: Silver Collagen - in clinic Xeroform - HHRN to use xeroform if patient runs out of silver collagen that was given to him from clinic Wound #4 Left Forearm: Xeroform Secondary Dressing: Wound #3 Left Gluteus: Boardered Foam Dressing Wound #4 Left Forearm: Conform/Kerlix Non-adherent pad Dressing Change Frequency: Wound #3 Left Gluteus: Change Dressing Monday, Wednesday, Friday Wound #4 Left Forearm: Change Dressing Monday, Wednesday, Friday Follow-up Appointments: Wound #3 Left Gluteus: Return Appointment in 1 week. Wound #4 Left Forearm: Return Appointment in 1 week. Off-Loading: Wound #3 Left Gluteus: Turn and reposition every 2  hours Additional Orders / Instructions: Wound #3 Left Gluteus: Vitamin A; Vitamin [name], Zinc - Please add a multivitamin that has vitamin A, vitamin [name] and zinc in it Increase protein intake. Activity as tolerated Wound #4 Left Forearm: Vitamin A; Vitamin [name], Zinc - Please add a multivitamin that has vitamin A, vitamin [name] and zinc in it Increase protein intake. Activity as tolerated Home Health: Wound #3 Left Gluteus: [place] Nurse may visit PRN to address patient s wound care needs. FACE TO FACE ENCOUNTER: MEDICARE and MEDICAID PATIENTS: I certify that this patient is under my care and that I had a face-to-face encounter that meets the physician face-to-face encounter requirements with this patient on this date. The encounter with the patient was in whole or in part for the following MEDICAL CONDITION: (primary reason for [place]) MEDICAL NECESSITY: I certify, that based on my findings, NURSING services are a medically necessary home health service. HOME BOUND STATUS: I certify that my clinical findings support that this patient is homebound (i.e., Due to illness or injury, pt requires aid of supportive devices such as crutches, cane, wheelchairs, walkers, the use of special transportation or the assistance of another person to leave their place of residence. There is a normal inability to leave the home and doing so requires considerable and taxing effort. Other absences are for medical reasons / religious services and are infrequent or of short duration when for other reasons). If current dressing causes regression in wound condition, may D/[name] ordered dressing product/s and apply Normal Saline Moist Dressing daily until next [place] / Other MD appointment. [place] of regression in wound condition at [phone]. Please direct any NON-WOUND related issues/requests for orders to patient's Primary Care Physician GILLIAM, [name] ([id number]) Wound #4 Left Forearm: [place] Nurse may visit PRN to address patient s wound care needs. FACE TO FACE ENCOUNTER: MEDICARE and MEDICAID PATIENTS: I certify that this patient is under my  care and that I had a face-to-face encounter that meets the physician face-to-face encounter requirements with this patient on this date. The encounter with the patient was in whole or in part for the following MEDICAL CONDITION: (primary reason for [place]) MEDICAL NECESSITY: I certify, that based on my findings, NURSING services are a medically necessary home health service. HOME BOUND STATUS: I certify that my clinical findings support that this patient is homebound (i.e., Due to illness or injury, pt requires aid of supportive devices such as crutches, cane, wheelchairs, walkers, the use of special transportation or the assistance of another person to leave their place of residence. There is a normal inability to leave the home and doing so requires considerable and taxing effort. Other absences are for medical reasons / religious services and are infrequent or of short duration when for other reasons). If current dressing causes regression in wound condition, may D/[name] ordered dressing product/s and apply Normal Saline Moist Dressing daily until next [place] / Other MD appointment. [place] of regression in wound condition at [phone]. Please direct any NON-WOUND related issues/requests for orders to patient's Primary Care Physician I am going to suggest at this point that we initiate the above wound care orders over the next week. Patient is in agreement the plan. We will subsequently see were things stand at follow-up. Please see above for specific wound care orders. We will see patient for re-evaluation in 1 week(s) here in the clinic. If anything worsens or changes patient will contact our office for additional  recommendations. Electronic Signature(s) Signed: 04/08/2018 12:34:34 PM By: Worthy Keeler [name] Entered By: Worthy Keeler on 04/08/2018 11:16:36 Montejano, [name] ([id number]) -------------------------------------------------------------------------------- ROS/PFSH Details Patient Name: [name] Date of Service: 04/07/2018 8:45 AM Medical Record Number: [id number] Patient Account Number: [id number] Date of Birth/Sex: 16-Sep-1941 (76 y.o. M) Treating RN: [name] Primary Care Provider: Park [name] Other Clinician: Referring Provider: Park [name] Treating Provider/Extender: [name], Martin Weeks in Treatment: 1 Unable to Obtain Patient History due to oo Altered Mental Status Information Obtained From Patient Wound History Do you currently have one or more open woundso Yes How many open wounds do you currently haveo 1 Approximately how long have you had your woundso 2 weeks How have you been treating your wound(s) until nowo vaseline Has your wound(s) ever healed and then re-openedo No Have you had any lab work done in the past montho No Have you tested positive for an antibiotic resistant organism (MRSA, VRE)o No Have you tested positive for osteomyelitis (bone infection)o No Have you had any tests for circulation on your legso No Have you had other problems associated with your woundso Infection Constitutional Symptoms (General Health) Complaints and Symptoms: Negative for: Fever; Chills Respiratory Complaints and Symptoms: No Complaints or Symptoms Cardiovascular Complaints and Symptoms: No Complaints or Symptoms Endocrine Medical History: Positive for: Type II Diabetes Treated with: Oral agents Blood sugar tested every day: No Oncologic Medical History: Past Medical History Notes: multiple myeloma Psychiatric Hidrogo, Quency [name]. ([id number]) Complaints and Symptoms: No Complaints or Symptoms Immunizations Pneumococcal Vaccine: Received Pneumococcal  Vaccination: No Implantable Devices Family and Social History Former smoker; Marital Status - Married; Alcohol Use: Daily; Drug Use: No History; Caffeine Use: Rarely; Advanced Directives: No; Patient does not want information on Advanced Directives; Do not resuscitate: No; Living Will: No; Medical Power of Attorney: No Physician Affirmation I have reviewed and agree with the above information. Electronic Signature(s) Signed: 04/08/2018 12:34:34  PM By: Worthy Keeler [name] Signed: 04/10/2018 5:27:38 PM By: [name] Entered By: Worthy Keeler on 04/08/2018 11:14:21 Quiocho, [name] ([id number]) -------------------------------------------------------------------------------- SuperBill Details Patient Name: [name] Date of Service: 04/07/2018 Medical Record Number: [id number] Patient Account Number: [id number] Date of Birth/Sex: 1941/10/11 (77 y.o. 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, stage 2 C90.00 Multiple myeloma not having achieved remission Facility Procedures CPT4 Code: 03546568 Description: 11042 - DEB SUBQ TISSUE 20 SQ CM/< ICD-10 Diagnosis Description S41.102A Unspecified open wound of left upper arm, initial encount Modifier: er Quantity: 1 Physician Procedures CPT4 Code: [id number] Description: 99214 - WC PHYS LEVEL 4 - EST PT ICD-10 Diagnosis Description I89.0 Lymphedema, not elsewhere classified I73.9 Peripheral vascular disease, unspecified S41.102A Unspecified open wound of left upper arm, initial encount L89.322 Pressure ulcer  of left buttock, stage 2 Modifier: 25  er Quantity: 1 CPT4 Code: [id number] Description: 96759 - WC PHYS SUBQ TISS 20 SQ CM ICD-10 Diagnosis Description S41.102A Unspecified open wound of left upper arm, initial encount Modifier: er Quantity: 1 Electronic Signature(s) Signed: 04/08/2018 12:34:34 PM By: Worthy Keeler [name] Entered By: Worthy Keeler on 04/08/2018 11:17:05

## 2018-04-13 NOTE — Progress Notes (Signed)
BRUK, TUMOLO ([id number]) Visit Report for 04/11/2018 Chief Complaint Document Details Patient Name: [name], [name] Date of Service: 04/11/2018 10:15 AM Medical Record Number: [id number] Patient Account Number: [id number] Date of Birth/Sex: 1941/05/26 (77 y.o. M) Treating RN: Ahmed Prima Primary Care Provider: Park Liter Other Clinician: Referring Provider: Park Liter Treating Provider/Extender: Melburn Hake, Aurorah Schlachter Weeks in Treatment: 1 Information Obtained from: Patient Chief Complaint Left gluteal ulcer Electronic Signature(s) Signed: 04/12/2018 1:22:53 AM By: Worthy Keeler PA-C Entered By: Worthy Keeler on 04/11/2018 11:18:43 Job, [name] ([id number]) -------------------------------------------------------------------------------- Debridement Details Patient Name: [name] Date of Service: 04/11/2018 10:15 AM Medical Record Number: [id number] Patient Account Number: [id number] Date of Birth/Sex: 1941-03-06 (77 y.o. M) Treating RN: Ahmed Prima Primary Care Provider: Park Liter Other Clinician: Referring Provider: Park Liter Treating Provider/Extender: Melburn Hake, Tylon Kemmerling Weeks in Treatment: 1 Debridement Performed for Wound #3 Left Gluteus Assessment: Performed By: Physician STONE III, Ryleigh Esqueda E., PA-C Debridement Type: Debridement Pre-procedure Verification/Time Yes - 11:41 Out Taken: Start Time: 11:41 Pain Control: Lidocaine 4% Topical Solution Total Area Debrided (L x W): 0.7 (cm) x 0.9 (cm) = 0.63 (cm) Tissue and other material Viable, Non-Viable, Slough, Subcutaneous, Fibrin/Exudate, Slough debrided: Level: Skin/Subcutaneous Tissue Debridement Description: Excisional Instrument: Curette Bleeding: Minimum Hemostasis Achieved: Pressure End Time: 11:43 Procedural Pain: 0 Post Procedural Pain: 0 Response to Treatment: Procedure was tolerated well Level of Consciousness: Awake and Alert Post Procedure Vitals: Temperature: 98.3 Pulse:  79 Respiratory Rate: 16 Blood Pressure: Systolic Blood Pressure: 291 Diastolic Blood Pressure: 50 Post Debridement Measurements of Total Wound Length: (cm) 0.7 Stage: Category/Stage II Width: (cm) 0.9 Depth: (cm) 0.2 Volume: (cm) 0.099 Character of Wound/Ulcer Post Requires Further Debridement Debridement: Post Procedure Diagnosis Same as Pre-procedure Electronic Signature(s) Signed: 04/11/2018 12:46:35 PM By: Alric Quan Signed: 04/12/2018 1:22:53 AM By: Worthy Keeler PA-C Entered By: Alric Quan on 04/11/2018 12:46:34 [name], [name] [place], [name] -------------------------------------------------------------------------------- HPI Details Patient Name: [name], [name] Date of Service: 04/11/2018 10:15 AM Medical Record Number: [id number] Patient Account Number: [id number] Date of Birth/Sex: 07/30/1941 (77 y.o. M) Treating RN: Ahmed Prima Primary Care Provider: Park Liter Other Clinician: Referring Provider: Park Liter Treating Provider/Extender: Melburn Hake, Taleigh Gero Weeks in Treatment: 1 History of Present Illness HPI Description: 07/08/17 on evaluation today patient presents initially for issues that he has been having with bilateral lower extremities this has been going on for the best I can tell "about a month" he has been seen by his primary care provider at [facility] family practice and they have been performing wraps. They also did get him set up with home health although there was confusion about what company was providing the home health for him. Unfortunately patient with a poor historian and it was difficult to gather information about how this has really progressed over the past month. Nonetheless he does have erythema noted of the right lower extremity and ulceration at this location as well. He has what appears to be lymphedema noted of the bilateral lower extremities and the left forefoot region is macerated potentially  with a fungal infection noted as well. No fevers, chills, nausea, or vomiting noted at this time. As best I can gather patient has a history of hypothyroidism, hypertension, medicinal and antinausea medication he tells me due to the fact that he has been on the antibiotic. 07/22/2017 -- I'm reviewing this 77 year old gentleman for the first time today, and notes that he was recently in hospital but he got himself  discharge AMA, and during this time he was treated for acute hyponatremia, cellulitis and bilateral leg ulceration with a history of alcohol abuse. The patient's hemoglobin A1c was 5.1 the last time he was in hospital and he was also noted to have cellulitis of the lower extremity and since he could not tolerate Augmentin was started on Bactrim. the patient and his son both have limited understanding and knowledge and it seems quite difficult to make them understand the treatment plan 08/05/17 on evaluation today patient's wound appears to be healed and he is doing very well. Unfortunately we have been unable to obtain the compression stockings for him at this point. We will discuss it further and plan. Otherwise he has no other concerns. Readmission: 03/31/18 on evaluation today patient is seen back in our clinic for a different issue than what we previously have evaluated him for. 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 He states this only seems to hurt when he sits and unfortunately he tells me that he sits the majority of his day in his chair. I did ask him about whether or not he had accounts that he can [place] he states he does but he never lays on it. He does have multiple myeloma and is currently under treatment for this. With that being said he also tells [place] that he just eats peanut butter sandwiches and boiled eggs each day and drinks beer. When directly asked if each anything else he says no.  Nonetheless it does appear that the wound itself is fairly superficial and I think could actually heal very nicely with appropriate treatment and therapy. 04/07/18 on evaluation today patient actually appears to be doing about the same in regard to the sacral ulcer. In regard to his ulcer on the left forearm which is new and secondary to fall this appears to be skin tear. He does have some issues with balance specifically related to a recent medication that he was placed on. The good news is this was discontinued a week ago nonetheless he did have a fall which was about a week ago causing the skin tear. There is some hematoma underneath the flap which is actually necrotic and does not appear to be reattaching. 04/10/18 on evaluation today patient's wounds actually appear to be showing some signs of improvement. Unfortunately it sounds like he is not eating appropriately at this point according to his son who was present during evaluation today as well. I did inquire the patient as to why this is he states "I'm just not hungry". He has been tolerating the dressing changes without complication he does not seem to have any evidence of infection at this point. Overall I'm fairly pleased with the progress that has been made although again I think it's lower than it could be if you would stay off of his bottom and eat more appropriately I suspect he is probably malnourished. JAECION, DEMPSTER ([id number]) Electronic Signature(s) Signed: 04/12/2018 1:22:53 AM By: Worthy Keeler PA-C Entered By: Worthy Keeler on 04/11/2018 17:01:22 [name], [name] ([id number]) -------------------------------------------------------------------------------- Physical Exam Details Patient Name: Kalafut, Francisco C. Date of Service: 04/11/2018 10:15 AM Medical Record Number: [id number] Patient Account Number: [id number] Date of Birth/Sex: 12/27/1940 (77 y.o. M) Treating RN: Ahmed Prima Primary Care Provider: Park Liter  Other Clinician: Referring Provider: Park Liter Treating Provider/Extender: Melburn Hake, Euan Wandler Weeks in Treatment: 1 Constitutional Chronically ill appearing but in no  apparent acute distress. Respiratory normal breathing without difficulty. clear to auscultation bilaterally. Cardiovascular regular rate and rhythm with normal S1, S2. Psychiatric this patient is able to make decisions and demonstrates good insight into disease process. Alert and Oriented x 3. pleasant and cooperative. Notes Currently patient's wounds did show some Slough noted in regard to the sacral wound the left arm ulcer actually appears to be doing fairly well. Sharp debridement was performed of the wound in the right gluteal location to remove the slough noted at this point. Patient tolerated this without complication and post debridement the wound bed appears to be doing better. Again no sharp debridement was required in regard to the left forearm ulceration. Electronic Signature(s) Signed: 04/12/2018 1:22:53 AM By: Worthy Keeler PA-C Entered By: Worthy Keeler on 04/12/2018 00:20:13 [name] ([id number]) -------------------------------------------------------------------------------- Physician Orders Details Patient Name: [name] Date of Service: 04/11/2018 10:15 AM Medical Record Number: [id number] Patient Account Number: [id number] Date of Birth/Sex: Jun 30, 1941 (77 y.o. M) Treating RN: Ahmed Prima Primary Care Provider: Park Liter Other Clinician: Referring Provider: Park Liter Treating Provider/Extender: Melburn Hake, Baer Hinton Weeks in Treatment: 1 Verbal / Phone Orders: Yes Clinician: Pinkerton, Debi Read Back and Verified: Yes Diagnosis Coding ICD-10 Coding Code Description I89.0 Lymphedema, not elsewhere classified I73.9 Peripheral vascular disease, unspecified S41.102A Unspecified open wound of left upper arm, initial encounter L89.322 Pressure ulcer of left buttock, stage  2 C90.00 Multiple myeloma not having achieved remission Wound Cleansing Wound #3 Left Gluteus o Clean wound with Normal Saline. o Cleanse wound with mild soap and water o May Shower, gently pat wound dry prior to applying new dressing. Wound #4 Left Forearm o Clean wound with Normal Saline. o Cleanse wound with mild soap and water o May Shower, gently pat wound dry prior to applying new dressing. Anesthetic (add to Medication List) Wound #3 Left Gluteus o Topical Lidocaine 4% cream applied to wound bed prior to debridement (In Clinic Only). Wound #4 Left Forearm o Topical Lidocaine 4% cream applied to wound bed prior to debridement (In Clinic Only). Primary Wound Dressing Wound #3 Left Gluteus o Silver Collagen - in clinic o Xeroform - HHRN to use xeroform if patient runs out of silver collagen that was given to him from clinic Wound #4 Left Forearm o Xeroform Secondary Dressing Wound #3 Left Gluteus o Boardered Foam Dressing Wound #4 Left Forearm o Conform/Kerlix o Non-adherent pad o Other - stretch netting #4 [name], [name] ([id number]) Dressing Change Frequency Wound #3 Left Gluteus o Change Dressing Monday, Wednesday, Friday Wound #4 Left Forearm o Change Dressing Monday, Wednesday, Friday Follow-up Appointments Wound #3 Left Gluteus o Return Appointment in 1 week. Wound #4 Left Forearm o Return Appointment in 1 week. Off-Loading Wound #3 Left Gluteus o Turn and reposition every 2 hours Additional Orders / Instructions Wound #3 Left Gluteus o Vitamin A; Vitamin C, Zinc - Please add a multivitamin that has vitamin A, vitamin C and zinc in it o Increase protein intake. o Activity as tolerated Wound #4 Left Forearm o Vitamin A; Vitamin C, Zinc - Please add a multivitamin that has vitamin A, vitamin C and zinc in it o Increase protein intake. o Activity as tolerated Home Health Wound #3 Left [place] Nurse may visit PRN to address patientos wound care needs. o FACE TO FACE ENCOUNTER: MEDICARE and MEDICAID PATIENTS: I certify that this patient is under my care and that I had a  face-to-face encounter that meets the physician face-to-face encounter requirements with this patient on this date. The encounter with the patient was in whole or in part for the following MEDICAL CONDITION: (primary reason for [place]) MEDICAL NECESSITY: I certify, that based on my findings, NURSING services are a medically necessary home health service. HOME BOUND STATUS: I certify that my clinical findings support that this patient is homebound (i.e., Due to illness or injury, pt requires aid of supportive devices such as crutches, cane, wheelchairs, walkers, the use of special transportation or the assistance of another person to leave their place of residence. There is a normal inability to leave the home and doing so requires considerable and taxing effort. Other absences are for medical reasons / religious services and are infrequent or of short duration when for other reasons). o If current dressing causes regression in wound condition, may D/C ordered dressing product/s and apply Normal Saline Moist Dressing daily until next [place] / Other MD appointment. [place] of regression in wound condition at [phone]. o Please direct any NON-WOUND related issues/requests for orders to patient's Primary Care Physician Wound #4 Left Forearm o [place] Nurse may visit PRN to address patientos wound care needs. o FACE TO FACE ENCOUNTER: MEDICARE and MEDICAID PATIENTS: I certify that this patient is under my care and that I had a face-to-face encounter that meets the physician face-to-face encounter requirements with this patient on this date. The encounter with the patient was in whole or in  part for the following [place]. ([id number]) CONDITION: (primary reason for Home Healthcare) MEDICAL NECESSITY: I certify, that based on my findings, NURSING services are a medically necessary home health service. HOME BOUND STATUS: I certify that my clinical findings support that this patient is homebound (i.e., Due to illness or injury, pt requires aid of supportive devices such as crutches, cane, wheelchairs, walkers, the use of special transportation or the assistance of another person to leave their place of residence. There is a normal inability to leave the home and doing so requires considerable and taxing effort. Other absences are for medical reasons / religious services and are infrequent or of short duration when for other reasons). o If current dressing causes regression in wound condition, may D/C ordered dressing product/s and apply Normal Saline Moist Dressing daily until next [place] / Other MD appointment. [place] of regression in wound condition at [phone]. o Please direct any NON-WOUND related issues/requests for orders to patient's Primary Care Physician Patient Medications Allergies: No Known Drug Allergies Notifications Medication Indication Start End lidocaine DOSE 1 - topical 4 % cream - 1 cream topical Electronic Signature(s) Signed: 04/11/2018 4:10:39 PM By: Alric Quan Signed: 04/12/2018 1:22:53 AM By: Worthy Keeler PA-C Entered By: Alric Quan on 04/11/2018 11:42:24 [name], [name] ([id number]) -------------------------------------------------------------------------------- Prescription 04/11/2018 Patient Name: Janine Ores C. Provider: Worthy Keeler PA-C Date of Birth: 04/15/1941 NPI#: [id number] Sex: Jerilynn Mages DEA#: [id number] Phone #: [phone] License #: Patient Address: [place], [place] [address] [address] [place], [place] [address] [phone] Allergies No Known Drug Allergies Medication Medication: Route: Strength: Form: lidocaine 4 % topical cream topical 4% cream Class: TOPICAL LOCAL ANESTHETICS Dose: Frequency / Time: Indication: 1 1 cream topical Number of Refills: Number of Units: 0 Generic Substitution: Start Date: End Date: One Time Use:  Substitution Permitted No Note to Pharmacy: Signature(s): Date(s): Electronic Signature(s) Signed: 04/11/2018 4:10:39 PM By: Alric Quan Signed: 04/12/2018 1:22:53 AM By: Worthy Keeler PA-C Entered By: Alric Quan on 04/11/2018 11:42:26 [name], [name] ([id number]) --------------------------------------------------------------------------------  Problem List Details Patient Name: Manuele, Khristopher C. Date of Service: 04/11/2018 10:15 AM Medical Record Number: [id number] Patient Account Number: [id number] Date of Birth/Sex: 1941/04/07 (77 y.o. 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 Date of Service:  04/11/2018 10:15 AM Medical Record Number: [id number] Patient Account Number: [id number] Date of Birth/Sex: 1941-08-18 (77 y.o. M) Treating RN: Ahmed Prima Primary Care Provider: Park Liter Other Clinician: Referring Provider: Park Liter Treating Provider/Extender: Melburn Hake, Noha Karasik Weeks in Treatment: 1 Subjective Chief Complaint Information obtained from Patient Left gluteal ulcer History of Present Illness (HPI) 07/08/17 on evaluation today patient presents initially for issues that he has been having with bilateral lower extremities this has been going on for the best I can tell "about a month" he has been seen by his primary care provider at [facility] family practice and they have been performing wraps. They also did get him set up with home health although there was confusion about what company was providing the home health for him. Unfortunately patient with a poor historian and it was difficult to gather information about how this has really progressed over the past month. Nonetheless he does have erythema noted of the right lower extremity and ulceration at this location as well. He has what appears to be lymphedema noted of the bilateral lower extremities and the left forefoot region is macerated potentially with a fungal infection noted as well. No fevers, chills, nausea, or vomiting noted at this time. As best I can gather patient has a history of hypothyroidism, hypertension, medicinal and antinausea medication he tells me due to the fact that he has been on the antibiotic. 07/22/2017 -- I'm reviewing this 77 year old gentleman for the first time today, and notes that he was recently in hospital but he got himself discharge AMA, and during this time he was treated for acute hyponatremia, cellulitis and bilateral leg ulceration with a history of alcohol abuse. The patient's hemoglobin A1c was 5.1 the last time he was in hospital and he was also noted to have cellulitis of the  lower extremity and since he could not tolerate Augmentin was started on Bactrim. the patient and his son both have limited understanding and knowledge and it seems quite difficult to make them understand the treatment plan 08/05/17 on evaluation today patient's wound appears to be healed and he is doing very well. Unfortunately we have been unable to obtain the compression stockings for him at this point. We will discuss it further and plan. Otherwise he has no other concerns. Readmission: 03/31/18 on evaluation today patient is seen back in our clinic for a different  issue than what we previously have evaluated him for. 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 He states this only seems to hurt when he sits and unfortunately he tells me that he sits the majority of his day in his chair. I did ask him about whether or not he had accounts that he can [place] he states he does but he never lays on it. He does have multiple myeloma and is currently under treatment for this. With that being said he also tells [place] that he just eats peanut butter sandwiches and boiled eggs each day and drinks beer. When directly asked if each anything else he says no. Nonetheless it does appear that the wound itself is fairly superficial and I think could actually heal very nicely with appropriate treatment and therapy. 04/07/18 on evaluation today patient actually appears to be doing about the same in regard to the sacral ulcer. In regard to his ulcer on the left forearm which is new and secondary to fall this appears to be skin tear. He does have some issues with balance specifically related to a recent medication that he was placed on. The good news is this was discontinued a week ago nonetheless he did have a fall which was about a week ago causing the skin tear. There is some hematoma underneath the flap which is actually necrotic and does not  appear to be reattaching. 04/10/18 on evaluation today patient's wounds actually appear to be showing some signs of improvement. Unfortunately it [name], [name] ([id number]) sounds like he is not eating appropriately at this point according to his son who was present during evaluation today as well. I did inquire the patient as to why this is he states "I'm just not hungry". He has been tolerating the dressing changes without complication he does not seem to have any evidence of infection at this point. Overall I'm fairly pleased with the progress that has been made although again I think it's lower than it could be if you would stay off of his bottom and eat more appropriately I suspect he is probably malnourished. Patient History Unable to Obtain Patient History due to Altered Mental Status. Information obtained from Patient. Social History Former smoker, Marital Status - Married, Alcohol Use - Daily, Drug Use - No History, Caffeine Use - Rarely. Medical And Surgical History Notes Oncologic multiple myeloma Review of Systems (ROS) Constitutional Symptoms (General Health) Denies complaints or symptoms of Fever, Chills. Respiratory The patient has no complaints or symptoms. Cardiovascular The patient has no complaints or symptoms. Psychiatric The patient has no complaints or symptoms. Objective Constitutional Chronically ill appearing but in no apparent acute distress. Vitals Time Taken: 11:10 AM, Height: 67 in, Weight: 158 lbs, BMI: 24.7, Temperature: 98.3 F, Pulse: 79 bpm, Respiratory Rate: 16 breaths/min, Blood Pressure: 144/50 mmHg. Respiratory normal breathing without difficulty. clear to auscultation bilaterally. Cardiovascular regular rate and rhythm with normal S1, S2. Psychiatric this patient is able to make decisions and demonstrates good insight into disease process. Alert and Oriented x 3. pleasant and cooperative. General Notes: Currently patient's wounds did show  some Slough noted in regard to the sacral wound the left arm ulcer actually appears to be doing fairly well. Sharp debridement was performed of the wound in the right gluteal location to remove [name], [name] ([id number]) the slough noted at this point. Patient tolerated this without complication and post debridement the wound bed  appears to be doing better. Again no sharp debridement was required in regard to the left forearm ulceration. Integumentary (Hair, Skin) Wound #3 status is Open. Original cause of wound was Pressure Injury. The wound is located on the Left Gluteus. The wound measures 0.7cm length x 0.9cm width x 0.1cm depth; 0.495cm^2 area and 0.049cm^3 volume. There is Fat Layer (Subcutaneous Tissue) Exposed exposed. There is no tunneling or undermining noted. There is a none present amount of drainage noted. The wound margin is flat and intact. There is medium (34-66%) granulation within the wound bed. There is a medium (34-66%) amount of necrotic tissue within the wound bed including Adherent Slough. The periwound skin appearance exhibited: Erythema. The periwound skin appearance did not exhibit: Callus, Crepitus, Excoriation, Induration, Rash, Scarring, Dry/Scaly, Maceration, Atrophie Blanche, Cyanosis, Ecchymosis, Hemosiderin Staining, Mottled, Pallor, Rubor. The surrounding wound skin color is noted with erythema which is circumferential. Periwound temperature was noted as No Abnormality. The periwound has tenderness on palpation. Wound #4 status is Open. Original cause of wound was Trauma. The wound is located on the Left Forearm. The wound measures 2.5cm length x 1.5cm width x 0.1cm depth; 2.945cm^2 area and 0.295cm^3 volume. The wound is limited to skin breakdown. There is a medium amount of serosanguineous drainage noted. The wound margin is flat and intact. There is large (67-100%) red granulation within the wound bed. There is a small (1-33%) amount of necrotic tissue within  the wound bed including Eschar. The periwound skin appearance did not exhibit: Callus, Crepitus, Excoriation, Induration, Rash, Scarring, Dry/Scaly, Maceration, Atrophie Blanche, Cyanosis, Ecchymosis, Hemosiderin Staining, Mottled, Pallor, Rubor, Erythema. Assessment Active Problems ICD-10 Lymphedema, not elsewhere classified Peripheral vascular disease, unspecified Unspecified open wound of left upper arm, initial encounter Pressure ulcer of left buttock, stage 3 Multiple myeloma not having achieved remission Procedures Wound #3 Pre-procedure diagnosis of Wound #3 is a Pressure Ulcer located on the Left Gluteus . There was a Excisional Skin/Subcutaneous Tissue Debridement with a total area of 0.63 sq cm performed by STONE III, Emani Morad E., PA-C. With the following instrument(s): Curette to remove Viable and Non-Viable tissue/material. Material removed includes Subcutaneous Tissue, Slough, and Fibrin/Exudate after achieving pain control using Lidocaine 4% Topical Solution. No specimens were taken. A time out was conducted at 11:41, prior to the start of the procedure. A Minimum amount of bleeding was controlled with Pressure. The procedure was tolerated well with a pain level of 0 throughout and a pain level of 0 following the procedure. Patient s Level of Consciousness post procedure was recorded as Awake and Alert. Post Debridement Measurements: 0.7cm length x 0.9cm width x 0.2cm depth; 0.099cm^3 volume. Post debridement Stage noted as Category/Stage II. Character of Wound/Ulcer Post Debridement requires further debridement. Post procedure Diagnosis Wound #3: Same as Pre-Procedure [name], [name] ([id number]) Plan Wound Cleansing: Wound #3 Left Gluteus: Clean wound with Normal Saline. Cleanse wound with mild soap and water May Shower, gently pat wound dry prior to applying new dressing. Wound #4 Left Forearm: Clean wound with Normal Saline. Cleanse wound with mild soap and water May  Shower, gently pat wound dry prior to applying new dressing. Anesthetic (add to Medication List): Wound #3 Left Gluteus: Topical Lidocaine 4% cream applied to wound bed prior to debridement (In Clinic Only). Wound #4 Left Forearm: Topical Lidocaine 4% cream applied to wound bed prior to debridement (In Clinic Only). Primary Wound Dressing: Wound #3 Left Gluteus: Silver Collagen - in clinic Xeroform - HHRN to use xeroform if patient runs  out of silver collagen that was given to him from clinic Wound #4 Left Forearm: Xeroform Secondary Dressing: Wound #3 Left Gluteus: Boardered Foam Dressing Wound #4 Left Forearm: Conform/Kerlix Non-adherent pad Other - stretch netting #4 Dressing Change Frequency: Wound #3 Left Gluteus: Change Dressing Monday, Wednesday, Friday Wound #4 Left Forearm: Change Dressing Monday, Wednesday, Friday Follow-up Appointments: Wound #3 Left Gluteus: Return Appointment in 1 week. Wound #4 Left Forearm: Return Appointment in 1 week. Off-Loading: Wound #3 Left Gluteus: Turn and reposition every 2 hours Additional Orders / Instructions: Wound #3 Left Gluteus: Vitamin A; Vitamin C, Zinc - Please add a multivitamin that has vitamin A, vitamin C and zinc in it Increase protein intake. Activity as tolerated Wound #4 Left Forearm: Vitamin A; Vitamin C, Zinc - Please add a multivitamin that has vitamin A, vitamin C and zinc in it Increase protein intake. Activity as tolerated Home Health: Wound #3 Left Gluteus: [place] Nurse may visit PRN to address patient s wound care needs. FACE TO FACE ENCOUNTER: MEDICARE and MEDICAID PATIENTS: I certify that this patient is under my care and that I [name], Elpidio C. ([id number]) had a face-to-face encounter that meets the physician face-to-face encounter requirements with this patient on this date. The encounter with the patient was in whole or in part for the following MEDICAL CONDITION:  (primary reason for [place]) MEDICAL NECESSITY: I certify, that based on my findings, NURSING services are a medically necessary home health service. HOME BOUND STATUS: I certify that my clinical findings support that this patient is homebound (i.e., Due to illness or injury, pt requires aid of supportive devices such as crutches, cane, wheelchairs, walkers, the use of special transportation or the assistance of another person to leave their place of residence. There is a normal inability to leave the home and doing so requires considerable and taxing effort. Other absences are for medical reasons / religious services and are infrequent or of short duration when for other reasons). If current dressing causes regression in wound condition, may D/C ordered dressing product/s and apply Normal Saline Moist Dressing daily until next [place] / Other MD appointment. [place] of regression in wound condition at [phone]. Please direct any NON-WOUND related issues/requests for orders to patient's Primary Care Physician Wound #4 Left Forearm: [place] Nurse may visit PRN to address patient s wound care needs. FACE TO FACE ENCOUNTER: MEDICARE and MEDICAID PATIENTS: I certify that this patient is under my care and that I had a face-to-face encounter that meets the physician face-to-face encounter requirements with this patient on this date. The encounter with the patient was in whole or in part for the following MEDICAL CONDITION: (primary reason for [place]) MEDICAL NECESSITY: I certify, that based on my findings, NURSING services are a medically necessary home health service. HOME BOUND STATUS: I certify that my clinical findings support that this patient is homebound (i.e., Due to illness or injury, pt requires aid of supportive devices such as crutches, cane, wheelchairs, walkers, the use of special transportation or the  assistance of another person to leave their place of residence. There is a normal inability to leave the home and doing so requires considerable and taxing effort. Other absences are for medical reasons / religious services and are infrequent or of short duration when for other reasons). If current dressing causes regression in wound condition, may D/C ordered dressing product/s and apply Normal Saline Moist  Dressing daily until next [place] / Other MD appointment. [place] of regression in wound condition at [phone]. Please direct any NON-WOUND related issues/requests for orders to patient's Primary Care Physician The following medication(s) was prescribed: lidocaine topical 4 % cream 1 1 cream topical was prescribed at facility I had a lengthy conversation with patient today with his son present in regard to him eating and how important this is gonna be for wound healing. I explained to the patient that he has to eat even if he feels like he doesn't want to otherwise he's gonna propagate a condition of non-attrition if he is not already malnourished. This is then subsequently going to negatively impact his healing. The patient seem to listen to what I was saying today I'm not sure if you will follow the directions or not. Otherwise we are gonna see him for reevaluation in one weeks time to see how things appear. Please see above for specific wound care orders. We will see patient for re-evaluation in 1 week(s) here in the clinic. If anything worsens or changes patient will contact our office for additional recommendations. Electronic Signature(s) Signed: 04/12/2018 1:22:53 AM By: Worthy Keeler PA-C Entered By: Worthy Keeler on 04/12/2018 00:20:45 Siedlecki, [name] ([id number]) -------------------------------------------------------------------------------- ROS/PFSH Details Patient Name: [name] Date of Service: 04/11/2018 10:15 AM Medical Record  Number: [id number] Patient Account Number: [id number] Date of Birth/Sex: Mar 14, 1941 (77 y.o. M) Treating RN: Ahmed Prima Primary Care Provider: Park Liter Other Clinician: Referring Provider: Park Liter Treating Provider/Extender: Melburn Hake, Sonali Wivell Weeks in Treatment: 1 Unable to Obtain Patient History due to oo Altered Mental Status Information Obtained From Patient Wound History Do you currently have one or more open woundso Yes How many open wounds do you currently haveo 1 Approximately how long have you had your woundso 2 weeks How have you been treating your wound(s) until nowo vaseline Has your wound(s) ever healed and then re-openedo No Have you had any lab work done in the past montho No Have you tested positive for an antibiotic resistant organism (MRSA, VRE)o No Have you tested positive for osteomyelitis (bone infection)o No Have you had any tests for circulation on your legso No Have you had other problems associated with your woundso Infection Constitutional Symptoms (General Health) Complaints and Symptoms: Negative for: Fever; Chills Respiratory Complaints and Symptoms: No Complaints or Symptoms Cardiovascular Complaints and Symptoms: No Complaints or Symptoms Endocrine Medical History: Positive for: Type II Diabetes Treated with: Oral agents Blood sugar tested every day: No Oncologic Medical History: Past Medical History Notes: multiple myeloma Psychiatric Tango, Damacio C. ([id number]) Complaints and Symptoms: No Complaints or Symptoms Immunizations Pneumococcal Vaccine: Received Pneumococcal Vaccination: No Implantable Devices Family and Social History Former smoker; Marital Status - Married; Alcohol Use: Daily; Drug Use: No History; Caffeine Use: Rarely; Advanced Directives: No; Patient does not want information on Advanced Directives; Do not resuscitate: No; Living Will: No; Medical Power of Attorney: No Physician Affirmation I have  reviewed and agree with the above information. Electronic Signature(s) Signed: 04/12/2018 1:22:53 AM By: Worthy Keeler PA-C Signed: 04/12/2018 3:49:29 PM By: Alric Quan Entered By: Worthy Keeler on 04/11/2018 17:01:55 Muela, [name] ([id number]) -------------------------------------------------------------------------------- SuperBill Details Patient Name: [name] Date of Service: 04/11/2018 Medical Record Number: [id number] Patient Account Number: [id number] Date of Birth/Sex: 1940/12/23 (77 y.o. M) Treating RN: Ahmed Prima Primary Care Provider: Park Liter Other Clinician: Referring Provider: Park Liter Treating Provider/Extender: Melburn Hake, Keimora Swartout Weeks  in Treatment: 1 Diagnosis Coding ICD-10 Codes Code Description I89.0 Lymphedema, not elsewhere classified I73.9 Peripheral vascular disease, unspecified S41.102A Unspecified open wound of left upper arm, initial encounter L89.323 Pressure ulcer of left buttock, stage 3 C90.00 Multiple myeloma not having achieved remission Facility Procedures CPT4 Code: [id number] Description: 27556 - DEB SUBQ TISSUE 20 SQ CM/< ICD-10 Diagnosis Description L89.323 Pressure ulcer of left buttock, stage 3 Modifier: Quantity: 1 Physician Procedures CPT4 Code: [id number] Description: 58265 - WC PHYS SUBQ TISS 20 SQ CM ICD-10 Diagnosis Description L89.323 Pressure ulcer of left buttock, stage 3 Modifier: Quantity: 1 Electronic Signature(s) Signed: 04/12/2018 1:22:53 AM By: Worthy Keeler PA-C Entered By: Worthy Keeler on 04/12/2018 00:20:56

## 2018-04-13 NOTE — Progress Notes (Signed)
NADIR, VASQUES ([id number]) Visit Report for 04/11/2018 Arrival Information Details Patient Name: [name], EYER. Date of Service: 04/11/2018 10:15 AM Medical Record Number: [id number] Patient Account Number: [id number] Date of Birth/Sex: 1941-06-19 (76 y.o. M) Treating RN: Cornell Barman Primary Care Dreanna Kyllo: Park Liter Other Clinician: Referring Tamsyn Owusu: Park Liter Treating Pinkey Mcjunkin/Extender: Melburn Hake, HOYT Weeks in Treatment: 1 Visit Information History Since Last Visit Added or deleted any medications: No Patient Arrived: Walker Any new allergies or adverse reactions: No Arrival Time: 11:10 Had a fall or experienced change in No Accompanied By: son activities of daily living that may affect Transfer Assistance: None risk of falls: Patient Identification Verified: Yes Signs or symptoms of abuse/neglect since last visito No Secondary Verification Process Completed: Yes Hospitalized since last visit: No Implantable device outside of the clinic excluding No cellular tissue based products placed in the center since last visit: Has Dressing in Place as Prescribed: Yes Pain Present Now: No Electronic Signature(s) Signed: 04/11/2018 4:47:16 PM By: Gretta Cool, BSN, RN, CWS, Kim RN, BSN Entered By: Gretta Cool, BSN, RN, CWS, Kim on 04/11/2018 11:10:36 Sandhu, [name] ([id number]) -------------------------------------------------------------------------------- Encounter Discharge Information Details Patient Name: Michalski, [name] Date of Service: 04/11/2018 10:15 AM Medical Record Number: [id number] Patient Account Number: [id number] Date of Birth/Sex: 10/30/1940 (76 y.o. M) Treating RN: Roger Shelter Primary Care Raeanna Soberanes: Park Liter Other Clinician: Referring Bode Pieper: Park Liter Treating Nilan Iddings/Extender: Melburn Hake, HOYT Weeks in Treatment: 1 Encounter Discharge Information Items Discharge Condition: Stable Ambulatory Status: Walker Discharge Destination:  Home Transportation: Private Auto Accompanied By: son Schedule Follow-up Appointment: Yes Clinical Summary of Care: Electronic Signature(s) Signed: 04/12/2018 3:34:51 PM By: Roger Shelter Entered By: Roger Shelter on 04/11/2018 11:56:49 Luttrull, [name] ([id number]) -------------------------------------------------------------------------------- Lower Extremity Assessment Details Patient Name: Cangelosi, [name] Date of Service: 04/11/2018 10:15 AM Medical Record Number: [id number] Patient Account Number: [id number] Date of Birth/Sex: 10-20-1940 (76 y.o. M) Treating RN: Cornell Barman Primary Care Maira Christon: Park Liter Other Clinician: Referring Haziel Molner: Park Liter Treating Alicya Bena/Extender: Sharalyn Ink in Treatment: 1 Electronic Signature(s) Signed: 04/11/2018 4:47:16 PM By: Gretta Cool, BSN, RN, CWS, Kim RN, BSN Entered By: Gretta Cool, BSN, RN, CWS, Kim on 04/11/2018 11:20:09 Adelsberger, [name] ([id number]) -------------------------------------------------------------------------------- Multi Wound Chart Details Patient Name: Austill, [name] Date of Service: 04/11/2018 10:15 AM Medical Record Number: [id number] Patient Account Number: [id number] Date of Birth/Sex: July 31, 1941 (76 y.o. 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] [4:Skin Tear] [N/A:N/A] Comorbid History: [3:Type II Diabetes] [4:Type II Diabetes] [N/A:N/A] Date Acquired: [3:03/17/2018] [4:04/03/2018]  [N/A:N/A] Weeks of Treatment: [3:1] [4:0] [N/A:N/A] Wound Status: [3:Open] [4:Open] [N/A:N/A] Measurements L x W x D [3:0.7x0.9x0.1] [4:2.5x1.5x0.1] [N/A:N/A] (cm) Area (cm) : [3:0.495] [4:2.945] [N/A:N/A] Volume (cm) : [3:0.049] [4:0.295] [N/A:N/A] % Reduction in Area: [3:21.20%] [4:51.30%] [N/A:N/A] % Reduction in Volume: [3:22.20%] [4:51.20%] [N/A:N/A] Classification: [3:Category/Stage II] [4:Partial Thickness] [N/A:N/A] Exudate Amount: [3:None Present] [4:Medium] [N/A:N/A] Exudate Type: [3:N/A] [4:Serosanguineous] [N/A:N/A] Exudate Color: [3:N/A] [4:red, brown] [N/A:N/A] Wound Margin: [3:Flat and Intact] [4:Flat and Intact] [N/A:N/A] Granulation Amount: [3:Medium (34-66%)] [4:Large (67-100%)] [N/A:N/A] Granulation Quality: [3:N/A] [4:Red] [N/A:N/A] Necrotic Amount: [3:Medium (34-66%)] [4:Small (1-33%)] [N/A:N/A] Necrotic Tissue: [3:Adherent Slough] [4:Eschar] [N/A:N/A] Exposed Structures: [  3:Fat Layer (Subcutaneous Tissue) Exposed: Yes Fascia: No Tendon: No Muscle: No Joint: No Bone: No] [4:Fascia: No Fat Layer (Subcutaneous Tissue) Exposed: No Tendon: No Muscle: No Joint: No Bone: No Limited to Skin Breakdown] [N/A:N/A] Epithelialization: [3:Small (1-33%)] [4:Medium (34-66%)] [N/A:N/A] Periwound Skin Texture: [3:Excoriation: No Induration: No Callus: No Crepitus: No] [4:Excoriation: No Induration: No Callus: No Crepitus: No] [N/A:N/A] Rash: No Rash: No Scarring: No Scarring: No Periwound Skin Moisture: Maceration: No Maceration: No N/A Dry/Scaly: No Dry/Scaly: No Periwound Skin Color: Erythema: Yes Atrophie Blanche: No N/A Atrophie Blanche: No Cyanosis: No Cyanosis: No Ecchymosis: No Ecchymosis: No Erythema: No Hemosiderin Staining: No Hemosiderin Staining: No Mottled: No Mottled: No Pallor: No Pallor: No Rubor: No Rubor: No Erythema Location: Circumferential N/A N/A Temperature: No Abnormality N/A N/A Tenderness on Palpation: Yes No N/A Wound  Preparation: Ulcer Cleansing: Ulcer Cleansing: N/A Rinsed/Irrigated with Saline Rinsed/Irrigated with Saline Topical Anesthetic Applied: Topical Anesthetic Applied: Other: lidocaine 4% Other: lidocaine 4% Treatment Notes Electronic Signature(s) Signed: 04/11/2018 4:10:39 PM By: Alric Quan Entered By: Alric Quan on 04/11/2018 11:36:37 Merriman, [name] ([id number]) -------------------------------------------------------------------------------- [place] Details Patient Name: Scotto, [name] Date of Service: 04/11/2018 10:15 AM Medical Record Number: [id number] Patient Account Number: [id number] Date of Birth/Sex: 20-Apr-1941 (76 y.o. M) Treating RN: Ahmed Prima Primary Care Ceonna Frazzini: Park Liter Other Clinician: Referring Giselle Brutus: Park Liter Treating Yogi Arther/Extender: Melburn Hake, HOYT Weeks in Treatment: 1 Active Inactive ` Abuse / Safety / Falls / Self Care Management Nursing Diagnoses: Impaired physical mobility Goals: Patient will not develop complications from immobility Date Initiated: 03/31/2018 Target Resolution Date: 06/17/2018 Goal Status: Active Interventions: Assess fall risk on admission and as needed Notes: ` Nutrition Nursing Diagnoses: Imbalanced nutrition Goals: Patient/caregiver agrees to and verbalizes understanding of need to use nutritional supplements and/or vitamins as prescribed Date Initiated: 03/31/2018 Target Resolution Date: 06/17/2018 Goal Status: Active Interventions: Assess patient nutrition upon admission and as needed per policy Notes: ` Orientation to the Wound Care Program Nursing Diagnoses: Knowledge deficit related to the wound healing center program Goals: Patient/caregiver will verbalize understanding of the [facility] Program Date Initiated: 03/31/2018 Target Resolution Date: 06/17/2018 Goal Status: Active Interventions: KAYTON, DUNAJ ([id number]) Provide education on orientation to  the wound center Notes: ` Pressure Nursing Diagnoses: Knowledge deficit related to causes and risk factors for pressure ulcer development Goals: Patient will remain free from development of additional pressure ulcers Date Initiated: 03/31/2018 Target Resolution Date: 06/17/2018 Goal Status: Active Interventions: Assess: immobility, friction, shearing, incontinence upon admission and as needed Notes: ` Wound/Skin Impairment Nursing Diagnoses: Impaired tissue integrity Goals: Ulcer/skin breakdown will heal within 14 weeks Date Initiated: 03/31/2018 Target Resolution Date: 06/17/2018 Goal Status: Active Interventions: Assess patient/caregiver ability to obtain necessary supplies Assess patient/caregiver ability to perform ulcer/skin care regimen upon admission and as needed Assess ulceration(s) every visit Notes: Electronic Signature(s) Signed: 04/11/2018 4:10:39 PM By: Alric Quan Entered By: Alric Quan on 04/11/2018 11:36:27 Cecilio, [name] ([id number]) -------------------------------------------------------------------------------- Pain Assessment Details Patient Name: [name]. Date of Service: 04/11/2018 10:15 AM Medical Record Number: [id number] Patient Account Number: [id number] Date of Birth/Sex: [birth year] (76 y.o. M) Treating RN: Cornell Barman Primary Care Niketa Turner: Park Liter Other Clinician: Referring Jaclene Bartelt: Park Liter Treating Aribella Vavra/Extender: Melburn Hake, HOYT Weeks in Treatment: 1 Active Problems Location of Pain Severity and Description of Pain Patient Has Paino No Site Locations With Dressing Change: No Pain Management and Medication Current Pain Management: Electronic Signature(s) Signed: 04/11/2018 4:47:16 PM By: Gretta Cool, BSN, RN, CWS, Kim  RN, BSN Entered By: Gretta Cool, BSN, RN, CWS, Kim on 04/11/2018 11:10:48 Cancelliere, [name] ([id number]) -------------------------------------------------------------------------------- Patient/Caregiver  Education Details Patient Name: [name] Date of Service: 04/11/2018 10:15 AM Medical Record Number: [id number] Patient Account Number: [id number] Date of Birth/Gender: [birth year] (77 y.o. M) Treating RN: Roger Shelter Primary Care Physician: Park Liter Other Clinician: Referring Physician: Park Liter Treating Physician/Extender: Sharalyn Ink in Treatment: 1 Education Assessment Education Provided To: Patient Education Topics Provided Wound/Skin Impairment: Handouts: Caring for Your Ulcer Methods: Explain/Verbal Responses: State content correctly Electronic Signature(s) Signed: 04/12/2018 3:34:51 PM By: Roger Shelter Entered By: Roger Shelter on 04/11/2018 11:57:05 Moe, [name] ([id number]) -------------------------------------------------------------------------------- Wound Assessment Details Patient Name: Terrero, [name] Date of Service: 04/11/2018 10:15 AM Medical Record Number: [id number] Patient Account Number: [id number] Date of Birth/Sex: 1940-10-27 (76 y.o. M) Treating RN: Cornell Barman Primary Care Witt Plitt: Park Liter Other Clinician: Referring Burnell Matlin: Park Liter Treating Wess Baney/Extender: Melburn Hake, HOYT Weeks in Treatment: 1 Wound Status Wound Number: 3 Primary Etiology: Pressure Ulcer Wound Location: Left Gluteus Wound Status: Open Wounding Event: Pressure Injury Comorbid History: Type II Diabetes Date Acquired: 03/17/2018 Weeks Of Treatment: 1 Clustered Wound: No Photos Wound Measurements Length: (cm) 0.7 Width: (cm) 0.9 Depth: (cm) 0.1 Area: (cm) 0.495 Volume: (cm) 0.049 % Reduction in Area: 21.2% % Reduction in Volume: 22.2% Epithelialization: Small (1-33%) Tunneling: No Undermining: No Wound Description Classification: Category/Stage III Foul Odo Wound Margin: Flat and Intact Slough/F Exudate Amount: None Present r After Cleansing: No ibrino Yes Wound Bed Granulation Amount: Medium (34-66%) Exposed  Structure Necrotic Amount: Medium (34-66%) Fascia Exposed: No Necrotic Quality: Adherent Slough Fat Layer (Subcutaneous Tissue) Exposed: Yes Tendon Exposed: No Muscle Exposed: No Joint Exposed: No Bone Exposed: No Periwound Skin Texture Texture Color No Abnormalities Noted: No No Abnormalities Noted: No Callus: No Atrophie Blanche: No Petion, [name] ([id number]) Crepitus: No Cyanosis: No Excoriation: No Ecchymosis: No Induration: No Erythema: Yes Rash: No Erythema Location: Circumferential Scarring: No Hemosiderin Staining: No Mottled: No Moisture Pallor: No No Abnormalities Noted: No Rubor: No Dry / Scaly: No Maceration: No Temperature / Pain Temperature: No Abnormality Tenderness on Palpation: Yes Wound Preparation Ulcer Cleansing: Rinsed/Irrigated with Saline Topical Anesthetic Applied: Other: lidocaine 4%, Treatment Notes Wound #3 (Left Gluteus) 1. Cleansed with: Clean wound with Normal Saline 2. Anesthetic Topical Lidocaine 4% cream to wound bed prior to debridement 4. Dressing Applied: Prisma Ag 5. Secondary Dressing Applied Bordered Foam Dressing Electronic Signature(s) Signed: 04/12/2018 1:22:53 AM By: Worthy Keeler PA-C Signed: 04/12/2018 4:47:57 PM By: Gretta Cool BSN, RN, CWS, Kim RN, BSN Previous Signature: 04/11/2018 4:47:16 PM Version By: Gretta Cool, BSN, RN, CWS, Kim RN, BSN Entered By: Worthy Keeler on 04/12/2018 00:18:49 Aken, [name] ([id number]) -------------------------------------------------------------------------------- Wound Assessment Details Patient Name: Bhat, [name] Date of Service: 04/11/2018 10:15 AM Medical Record Number: [id number] Patient Account Number: [id number] Date of Birth/Sex: 08/13/1941 (76 y.o. M) Treating RN: Cornell Barman Primary Care Haruka Kowaleski: Park Liter Other Clinician: Referring Ree Alcalde: Park Liter Treating Avrielle Fry/Extender: Melburn Hake, HOYT Weeks in Treatment: 1 Wound Status Wound Number: 4 Primary  Etiology: Skin Tear Wound Location: Left Forearm Wound Status: Open Wounding Event: Trauma Comorbid History: Type II Diabetes Date Acquired: 04/03/2018 Weeks Of Treatment: 0 Clustered Wound: No Photos Photo Uploaded By: Gretta Cool, BSN, RN, CWS, Kim on 04/11/2018 13:25:30 Wound Measurements Length: (cm) 2.5 Width: (cm) 1.5 Depth: (cm) 0.1 Area: (cm) 2.945 Volume: (cm) 0.295 % Reduction in Area: 51.3% % Reduction in Volume: 51.2% Epithelialization: Medium (34-66%)  Wound Description Classification: Partial Thickness Wound Margin: Flat and Intact Exudate Amount: Medium Exudate Type: Serosanguineous Exudate Color: red, brown Foul Odor After Cleansing: No Slough/Fibrino No Wound Bed Granulation Amount: Large (67-100%) Exposed Structure Granulation Quality: Red Fascia Exposed: No Necrotic Amount: Small (1-33%) Fat Layer (Subcutaneous Tissue) Exposed: No Necrotic Quality: Eschar Tendon Exposed: No Muscle Exposed: No Joint Exposed: No Bone Exposed: No Limited to Skin Breakdown Periwound Skin Texture Harmon, [name] ([id number]) Texture Color No Abnormalities Noted: No No Abnormalities Noted: No Callus: No Atrophie Blanche: No Crepitus: No Cyanosis: No Excoriation: No Ecchymosis: No Induration: No Erythema: No Rash: No Hemosiderin Staining: No Scarring: No Mottled: No Pallor: No Moisture Rubor: No No Abnormalities Noted: No Dry / Scaly: No Maceration: No Wound Preparation Ulcer Cleansing: Rinsed/Irrigated with Saline Topical Anesthetic Applied: Other: lidocaine 4%, Treatment Notes Wound #4 (Left Forearm) 1. Cleansed with: Clean wound with Normal Saline 2. Anesthetic Topical Lidocaine 4% cream to wound bed prior to debridement 4. Dressing Applied: Xeroform 5. Secondary Dressing Applied Kerlix/Conform Non-Adherent pad Notes stretch net Electronic Signature(s) Signed: 04/11/2018 4:47:16 PM By: Gretta Cool, BSN, RN, CWS, Kim RN, BSN Entered By: Gretta Cool, BSN,  RN, CWS, Kim on 04/11/2018 11:15:07 Dunaway, [name] ([id number]) -------------------------------------------------------------------------------- Vitals Details Patient Name: [name]. Date of Service: 04/11/2018 10:15 AM Medical Record Number: [id number] Patient Account Number: [id number] Date of Birth/Sex: March 29, 1941 (76 y.o. M) Treating RN: Cornell Barman Primary Care Izabelle Daus: Park Liter Other Clinician: Referring Parrie Rasco: Park Liter Treating Berklee Battey/Extender: Melburn Hake, HOYT Weeks in Treatment: 1 Vital Signs Time Taken: 11:10 Temperature (F): 98.3 Height (in): 67 Pulse (bpm): 79 Weight (lbs): 158 Respiratory Rate (breaths/min): 16 Body Mass Index (BMI): 24.7 Blood Pressure (mmHg): 144/50 Reference Range: 80 - 120 mg / dl Electronic Signature(s) Signed: 04/11/2018 4:47:16 PM By: Gretta Cool, BSN, RN, CWS, Kim RN, BSN Entered By: Gretta Cool, BSN, RN, CWS, Kim on 04/11/2018 11:11:15

## 2018-04-14 DIAGNOSIS — E538 Deficiency of other specified B group vitamins: Secondary | ICD-10-CM | POA: Diagnosis not present

## 2018-04-14 DIAGNOSIS — E1140 Type 2 diabetes mellitus with diabetic neuropathy, unspecified: Secondary | ICD-10-CM | POA: Diagnosis not present

## 2018-04-14 DIAGNOSIS — E039 Hypothyroidism, unspecified: Secondary | ICD-10-CM | POA: Diagnosis not present

## 2018-04-14 DIAGNOSIS — C9000 Multiple myeloma not having achieved remission: Secondary | ICD-10-CM | POA: Diagnosis not present

## 2018-04-14 DIAGNOSIS — I129 Hypertensive chronic kidney disease with stage 1 through stage 4 chronic kidney disease, or unspecified chronic kidney disease: Secondary | ICD-10-CM | POA: Diagnosis not present

## 2018-04-14 DIAGNOSIS — N181 Chronic kidney disease, stage 1: Secondary | ICD-10-CM | POA: Diagnosis not present

## 2018-04-14 DIAGNOSIS — E1122 Type 2 diabetes mellitus with diabetic chronic kidney disease: Secondary | ICD-10-CM | POA: Diagnosis not present

## 2018-04-14 DIAGNOSIS — E1165 Type 2 diabetes mellitus with hyperglycemia: Secondary | ICD-10-CM | POA: Diagnosis not present

## 2018-04-17 DIAGNOSIS — E1122 Type 2 diabetes mellitus with diabetic chronic kidney disease: Secondary | ICD-10-CM | POA: Diagnosis not present

## 2018-04-17 DIAGNOSIS — E538 Deficiency of other specified B group vitamins: Secondary | ICD-10-CM | POA: Diagnosis not present

## 2018-04-17 DIAGNOSIS — C9000 Multiple myeloma not having achieved remission: Secondary | ICD-10-CM | POA: Diagnosis not present

## 2018-04-17 DIAGNOSIS — E1140 Type 2 diabetes mellitus with diabetic neuropathy, unspecified: Secondary | ICD-10-CM | POA: Diagnosis not present

## 2018-04-17 DIAGNOSIS — E039 Hypothyroidism, unspecified: Secondary | ICD-10-CM | POA: Diagnosis not present

## 2018-04-17 DIAGNOSIS — N181 Chronic kidney disease, stage 1: Secondary | ICD-10-CM | POA: Diagnosis not present

## 2018-04-17 DIAGNOSIS — I129 Hypertensive chronic kidney disease with stage 1 through stage 4 chronic kidney disease, or unspecified chronic kidney disease: Secondary | ICD-10-CM | POA: Diagnosis not present

## 2018-04-17 DIAGNOSIS — E1165 Type 2 diabetes mellitus with hyperglycemia: Secondary | ICD-10-CM | POA: Diagnosis not present

## 2018-04-18 ENCOUNTER — Other Ambulatory Visit: Payer: Self-pay | Admitting: Family Medicine

## 2018-04-18 ENCOUNTER — Ambulatory Visit: Payer: Medicare Other | Admitting: Family Medicine

## 2018-04-18 DIAGNOSIS — E538 Deficiency of other specified B group vitamins: Secondary | ICD-10-CM | POA: Diagnosis not present

## 2018-04-18 DIAGNOSIS — C9000 Multiple myeloma not having achieved remission: Secondary | ICD-10-CM | POA: Diagnosis not present

## 2018-04-18 DIAGNOSIS — E1140 Type 2 diabetes mellitus with diabetic neuropathy, unspecified: Secondary | ICD-10-CM | POA: Diagnosis not present

## 2018-04-18 DIAGNOSIS — L602 Onychogryphosis: Secondary | ICD-10-CM

## 2018-04-18 DIAGNOSIS — G629 Polyneuropathy, unspecified: Secondary | ICD-10-CM

## 2018-04-18 DIAGNOSIS — E1122 Type 2 diabetes mellitus with diabetic chronic kidney disease: Secondary | ICD-10-CM | POA: Diagnosis not present

## 2018-04-18 DIAGNOSIS — I129 Hypertensive chronic kidney disease with stage 1 through stage 4 chronic kidney disease, or unspecified chronic kidney disease: Secondary | ICD-10-CM | POA: Diagnosis not present

## 2018-04-18 DIAGNOSIS — E039 Hypothyroidism, unspecified: Secondary | ICD-10-CM | POA: Diagnosis not present

## 2018-04-18 DIAGNOSIS — N181 Chronic kidney disease, stage 1: Secondary | ICD-10-CM | POA: Diagnosis not present

## 2018-04-18 DIAGNOSIS — E1165 Type 2 diabetes mellitus with hyperglycemia: Secondary | ICD-10-CM | POA: Diagnosis not present

## 2018-04-20 ENCOUNTER — Encounter: Payer: Medicare Other | Admitting: Nurse Practitioner

## 2018-04-20 DIAGNOSIS — L89323 Pressure ulcer of left buttock, stage 3: Secondary | ICD-10-CM | POA: Diagnosis not present

## 2018-04-20 DIAGNOSIS — L97919 Non-pressure chronic ulcer of unspecified part of right lower leg with unspecified severity: Secondary | ICD-10-CM | POA: Diagnosis not present

## 2018-04-20 DIAGNOSIS — E11622 Type 2 diabetes mellitus with other skin ulcer: Secondary | ICD-10-CM | POA: Diagnosis not present

## 2018-04-20 DIAGNOSIS — S51802A Unspecified open wound of left forearm, initial encounter: Secondary | ICD-10-CM | POA: Diagnosis not present

## 2018-04-20 DIAGNOSIS — I890 Lymphedema, not elsewhere classified: Secondary | ICD-10-CM | POA: Diagnosis not present

## 2018-04-20 DIAGNOSIS — E1151 Type 2 diabetes mellitus with diabetic peripheral angiopathy without gangrene: Secondary | ICD-10-CM | POA: Diagnosis not present

## 2018-04-20 DIAGNOSIS — Z87891 Personal history of nicotine dependence: Secondary | ICD-10-CM | POA: Diagnosis not present

## 2018-04-20 DIAGNOSIS — C9000 Multiple myeloma not having achieved remission: Secondary | ICD-10-CM | POA: Diagnosis not present

## 2018-04-21 DIAGNOSIS — E1140 Type 2 diabetes mellitus with diabetic neuropathy, unspecified: Secondary | ICD-10-CM | POA: Diagnosis not present

## 2018-04-21 DIAGNOSIS — E538 Deficiency of other specified B group vitamins: Secondary | ICD-10-CM | POA: Diagnosis not present

## 2018-04-21 DIAGNOSIS — E1165 Type 2 diabetes mellitus with hyperglycemia: Secondary | ICD-10-CM | POA: Diagnosis not present

## 2018-04-21 DIAGNOSIS — C9000 Multiple myeloma not having achieved remission: Secondary | ICD-10-CM | POA: Diagnosis not present

## 2018-04-21 DIAGNOSIS — N181 Chronic kidney disease, stage 1: Secondary | ICD-10-CM | POA: Diagnosis not present

## 2018-04-21 DIAGNOSIS — E1122 Type 2 diabetes mellitus with diabetic chronic kidney disease: Secondary | ICD-10-CM | POA: Diagnosis not present

## 2018-04-21 DIAGNOSIS — I129 Hypertensive chronic kidney disease with stage 1 through stage 4 chronic kidney disease, or unspecified chronic kidney disease: Secondary | ICD-10-CM | POA: Diagnosis not present

## 2018-04-21 DIAGNOSIS — E039 Hypothyroidism, unspecified: Secondary | ICD-10-CM | POA: Diagnosis not present

## 2018-04-21 NOTE — Progress Notes (Addendum)
KALEEM, SARTWELL ([id number]) Visit Report for 04/20/2018 Arrival Information Details Patient Name: [name], [name] Date of Service: 04/20/2018 3:00 PM Medical Record Number: [id number] Patient Account Number: [id number] Date of Birth/Sex: [birth year] (76 y.o. M) Treating RN: Secundino Ginger Primary Care Shanah Guimaraes: Park Liter Other Clinician: Referring Tristian Sickinger: Park Liter Treating Julez Huseby/Extender: Cathie Olden in Treatment: 2 Visit Information History Since Last Visit Added or deleted any medications: No Patient Arrived: Walker Any new allergies or adverse reactions: No Arrival Time: 15:20 Had a fall or experienced change in No Accompanied By: son activities of daily living that may affect Transfer Assistance: None risk of falls: Patient Identification Verified: Yes Signs or symptoms of abuse/neglect since last visito No Secondary Verification Process Completed: Yes Hospitalized since last visit: No Implantable device outside of the clinic excluding No cellular tissue based products placed in the center since last visit: Pain Present Now: No Electronic Signature(s) Signed: 04/20/2018 3:51:06 PM By: Secundino Ginger Entered By: Secundino Ginger on 04/20/2018 15:21:33 Mccaffery, Trayveon Loletha Grayer ([id number]) -------------------------------------------------------------------------------- Clinic Level of Care Assessment Details Patient Name: [name] Date of Service: 04/20/2018 3:00 PM Medical Record Number: [id number] Patient Account Number: [id number] Date of Birth/Sex: October 06, 1941 (76 y.o. M) Treating RN: Ahmed Prima Primary Care Svetlana Bagby: Park Liter Other Clinician: Referring Lamona Eimer: Park Liter Treating Ruddy Swire/Extender: Cathie Olden in Treatment: 2 Clinic Level of Care Assessment Items TOOL 4 Quantity Score X - Use when only an EandM is performed on FOLLOW-UP visit 1 0 ASSESSMENTS - Nursing Assessment / Reassessment X - Reassessment of Co-morbidities (includes  updates in patient status) 1 10 X- 1 5 Reassessment of Adherence to Treatment Plan ASSESSMENTS - Wound and Skin Assessment / Reassessment X - Simple Wound Assessment / Reassessment - one wound 1 5 []  - 0 Complex Wound Assessment / Reassessment - multiple wounds []  - 0 Dermatologic / Skin Assessment (not related to wound area) ASSESSMENTS - Focused Assessment []  - Circumferential Edema Measurements - multi extremities 0 []  - 0 Nutritional Assessment / Counseling / Intervention []  - 0 Lower Extremity Assessment (monofilament, tuning fork, pulses) []  - 0 Peripheral Arterial Disease Assessment (using hand held doppler) ASSESSMENTS - Ostomy and/or Continence Assessment and Care []  - Incontinence Assessment and Management 0 []  - 0 Ostomy Care Assessment and Management (repouching, etc.) PROCESS - Coordination of Care []  - Simple Patient / Family Education for ongoing care 0 X- 1 20 Complex (extensive) Patient / Family Education for ongoing care X- 1 10 Staff obtains Programmer, systems, Records, Test Results / Process Orders X- 1 10 Staff telephones HHA, Nursing Homes / Clarify orders / etc []  - 0 Routine Transfer to another Facility (non-emergent condition) []  - 0 Routine Hospital Admission (non-emergent condition) []  - 0 New Admissions / Biomedical engineer / Ordering NPWT, Apligraf, etc. []  - 0 Emergency Hospital Admission (emergent condition) X- 1 10 Simple Discharge Coordination Seoane, Sisto C. ([id number]) []  - 0 Complex (extensive) Discharge Coordination PROCESS - Special Needs []  - Pediatric / Minor Patient Management 0 []  - 0 Isolation Patient Management []  - 0 Hearing / Language / Visual special needs []  - 0 Assessment of Community assistance (transportation, D/C planning, etc.) []  - 0 Additional assistance / Altered mentation []  - 0 Support Surface(s) Assessment (bed, cushion, seat, etc.) INTERVENTIONS - Wound Cleansing / Measurement X - Simple Wound Cleansing -  one wound 1 5 []  - 0 Complex Wound Cleansing - multiple wounds X- 1 5 Wound Imaging (photographs - any number of wounds) []  - 0  Wound Tracing (instead of photographs) X- 1 5 Simple Wound Measurement - one wound []  - 0 Complex Wound Measurement - multiple wounds INTERVENTIONS - Wound Dressings X - Small Wound Dressing one or multiple wounds 1 10 []  - 0 Medium Wound Dressing one or multiple wounds []  - 0 Large Wound Dressing one or multiple wounds X- 1 5 Application of Medications - topical []  - 0 Application of Medications - injection INTERVENTIONS - Miscellaneous []  - External ear exam 0 []  - 0 Specimen Collection (cultures, biopsies, blood, body fluids, etc.) []  - 0 Specimen(s) / Culture(s) sent or taken to Lab for analysis []  - 0 Patient Transfer (multiple staff / Civil Service fast streamer / Similar devices) []  - 0 Simple Staple / Suture removal (25 or less) []  - 0 Complex Staple / Suture removal (26 or more) []  - 0 Hypo / Hyperglycemic Management (close monitor of Blood Glucose) []  - 0 Ankle / Brachial Index (ABI) - do not check if billed separately X- 1 5 Vital Signs Marcelli, Alphonse C. ([id number]) Has the patient been seen at the hospital within the last three years: Yes Total Score: 105 Level Of Care: New/Established - Level 3 Electronic Signature(s) Signed: 04/21/2018 11:31:29 AM By: Alric Quan Entered By: Alric Quan on 04/20/2018 16:33:22 Biddle, Theophile Loletha Grayer ([id number]) -------------------------------------------------------------------------------- Lower Extremity Assessment Details Patient Name: [name], [name] Date of Service: 04/20/2018 3:00 PM Medical Record Number: [id number] Patient Account Number: [id number] Date of Birth/Sex: [birth year] (76 y.o. M) Treating RN: Secundino Ginger Primary Care Joriel Streety: Park Liter Other Clinician: Referring Avonell Lenig: Park Liter Treating Delphin Funes/Extender: Lawanda Cousins Weeks in Treatment: 2 Electronic Signature(s) Signed:  04/20/2018 3:51:06 PM By: Secundino Ginger Entered By: Secundino Ginger on 04/20/2018 15:40:09 Lemere, Dearies CMarland Kitchen ([id number]) -------------------------------------------------------------------------------- Multi Wound Chart Details Patient Name: Tayler, Johneric C. Date of Service: 04/20/2018 3:00 PM Medical Record Number: [id number] Patient Account Number: [id number] Date of Birth/Sex: [birth year] (76 y.o. M) Treating RN: Ahmed Prima Primary Care Hajer Dwyer: Park Liter Other Clinician: Referring Aloysius Heinle: Park Liter Treating Justyce Yeater/Extender: Cathie Olden in Treatment: 2 Vital Signs Height(in): 66 Pulse(bpm): 58 Weight(lbs): 158 Blood Pressure(mmHg): 146/54 Body Mass Index(BMI): 25 Temperature(F): 99.1 Respiratory Rate 18 (breaths/min): Photos: [N/A:N/A] Wound Location: Left Gluteus Left Forearm N/A Wounding Event: Pressure Injury Trauma N/A Primary Etiology: Pressure Ulcer Skin Tear N/A Comorbid History: Type II Diabetes Type II Diabetes N/A Date Acquired: 03/17/2018 04/03/2018 N/A Weeks of Treatment: 2 1 N/A Wound Status: Open Healed - Epithelialized N/A Measurements L x W x D 0.5x0.6x0.1 0x0x0 N/A (cm) Area (cm) : 0.236 0 N/A Volume (cm) : 0.024 0 N/A % Reduction in Area: 62.40% 100.00% N/A % Reduction in Volume: 61.90% 100.00% N/A Classification: Category/Stage III Partial Thickness N/A Exudate Amount: None Present Small N/A Exudate Type: N/A Serous N/A Exudate Color: N/A amber N/A Wound Margin: Flat and Intact Flat and Intact N/A Granulation Amount: Small (1-33%) None Present (0%) N/A Granulation Quality: Pink N/A N/A Necrotic Amount: Medium (34-66%) None Present (0%) N/A Exposed Structures: Fat Layer (Subcutaneous Fascia: No N/A Tissue) Exposed: Yes Fat Layer (Subcutaneous Fascia: No Tissue) Exposed: No Tendon: No Tendon: No Muscle: No Muscle: No Joint: No Joint: No Bone: No Bone: No Limited to Skin Breakdown Flynn, Nikan C.  ([id number]) Epithelialization: Small (1-33%) Medium (34-66%) N/A Periwound Skin Texture: Excoriation: No Excoriation: No N/A Induration: No Induration: No Callus: No Callus: No Crepitus: No Crepitus: No Rash: No Rash: No Scarring: No Scarring: No Periwound Skin Moisture: Maceration: No Maceration: No N/A Dry/Scaly: No Dry/Scaly: No Periwound  Skin Color: Erythema: Yes Atrophie Blanche: No N/A Atrophie Blanche: No Cyanosis: No Cyanosis: No Ecchymosis: No Ecchymosis: No Erythema: No Hemosiderin Staining: No Hemosiderin Staining: No Mottled: No Mottled: No Pallor: No Pallor: No Rubor: No Rubor: No Erythema Location: Circumferential N/A N/A Temperature: No Abnormality N/A N/A Tenderness on Palpation: Yes No N/A Wound Preparation: Ulcer Cleansing: Ulcer Cleansing: N/A Rinsed/Irrigated with Saline Rinsed/Irrigated with Saline Topical Anesthetic Applied: Other: lidocaine 4% Treatment Notes Electronic Signature(s) Signed: 04/20/2018 4:41:40 PM By: Lawanda Cousins Entered By: Lawanda Cousins on 04/20/2018 16:41:40 Eisen, Donnie Coffin ([id number]) -------------------------------------------------------------------------------- Multi-Disciplinary Care Plan Details Patient Name: SOU, NOHR. Date of Service: 04/20/2018 3:00 PM Medical Record Number: [id number] Patient Account Number: [id number] Date of Birth/Sex: 1941/07/09 (76 y.o. M) Treating RN: Ahmed Prima Primary Care Wen Munford: Park Liter Other Clinician: Referring Barrie Wale: Park Liter Treating Tracia Lacomb/Extender: Cathie Olden in Treatment: 2 Active Inactive ` Abuse / Safety / Falls / Self Care Management Nursing Diagnoses: Impaired physical mobility Goals: Patient will not develop complications from immobility Date Initiated: 03/31/2018 Target Resolution Date: 06/17/2018 Goal Status: Active Interventions: Assess fall risk on admission and as needed Notes: ` Nutrition Nursing  Diagnoses: Imbalanced nutrition Goals: Patient/caregiver agrees to and verbalizes understanding of need to use nutritional supplements and/or vitamins as prescribed Date Initiated: 03/31/2018 Target Resolution Date: 06/17/2018 Goal Status: Active Interventions: Assess patient nutrition upon admission and as needed per policy Notes: ` Orientation to the Wound Care Program Nursing Diagnoses: Knowledge deficit related to the wound healing center program Goals: Patient/caregiver will verbalize understanding of the [place] Program Date Initiated: 03/31/2018 Target Resolution Date: 06/17/2018 Goal Status: Active Interventions: TAKSH, HJORT ([id number]) Provide education on orientation to the wound center Notes: ` Pressure Nursing Diagnoses: Knowledge deficit related to causes and risk factors for pressure ulcer development Goals: Patient will remain free from development of additional pressure ulcers Date Initiated: 03/31/2018 Target Resolution Date: 06/17/2018 Goal Status: Active Interventions: Assess: immobility, friction, shearing, incontinence upon admission and as needed Notes: ` Wound/Skin Impairment Nursing Diagnoses: Impaired tissue integrity Goals: Ulcer/skin breakdown will heal within 14 weeks Date Initiated: 03/31/2018 Target Resolution Date: 06/17/2018 Goal Status: Active Interventions: Assess patient/caregiver ability to obtain necessary supplies Assess patient/caregiver ability to perform ulcer/skin care regimen upon admission and as needed Assess ulceration(s) every visit Notes: Electronic Signature(s) Signed: 04/21/2018 11:31:29 AM By: Alric Quan Entered By: Alric Quan on 04/20/2018 15:59:13 Serpe, Zakariah C. ([id number]) -------------------------------------------------------------------------------- Pain Assessment Details Patient Name: [name] Date of Service: 04/20/2018 3:00 PM Medical Record Number: [id number] Patient  Account Number: [id number] Date of Birth/Sex: 01-03-1941 (76 y.o. M) Treating RN: Secundino Ginger Primary Care Nickolaos Brallier: Park Liter Other Clinician: Referring Dozier Berkovich: Park Liter Treating Aalyah Mansouri/Extender: Cathie Olden in Treatment: 2 Active Problems Location of Pain Severity and Description of Pain Patient Has Paino No Site Locations Pain Management and Medication Current Pain Management: Electronic Signature(s) Signed: 04/20/2018 3:51:06 PM By: Secundino Ginger Entered By: Secundino Ginger on 04/20/2018 15:22:12 Wires, Renell C. ([id number]) -------------------------------------------------------------------------------- Wound Assessment Details Patient Name: Backhaus, Osamah C. Date of Service: 04/20/2018 3:00 PM Medical Record Number: [id number] Patient Account Number: [id number] Date of Birth/Sex: 26-Mar-1941 (76 y.o. M) Treating RN: Secundino Ginger Primary Care Chevy Virgo: Park Liter Other Clinician: Referring Thoren Hosang: Park Liter Treating Mitzy Naron/Extender: Lawanda Cousins Weeks in Treatment: 2 Wound Status Wound Number: 3 Primary Etiology: Pressure Ulcer Wound Location: Left Gluteus Wound Status: Open Wounding Event: Pressure Injury Comorbid History: Type II Diabetes Date Acquired: 03/17/2018 Weeks Of Treatment: 2 Clustered Wound: No  Photos Photo Uploaded By: Secundino Ginger on 04/20/2018 15:49:55 Wound Measurements Length: (cm) 0.5 Width: (cm) 0.6 Depth: (cm) 0.1 Area: (cm) 0.236 Volume: (cm) 0.024 % Reduction in Area: 62.4% % Reduction in Volume: 61.9% Epithelialization: Small (1-33%) Tunneling: No Undermining: No Wound Description Classification: Category/Stage III Foul Odo Wound Margin: Flat and Intact Slough/F Exudate Amount: None Present r After Cleansing: No ibrino Yes Wound Bed Granulation Amount: Small (1-33%) Exposed Structure Granulation Quality: Pink Fascia Exposed: No Necrotic Amount: Medium (34-66%) Fat Layer (Subcutaneous Tissue) Exposed: Yes Tendon  Exposed: No Muscle Exposed: No Joint Exposed: No Bone Exposed: No Periwound Skin Texture Texture Color No Abnormalities Noted: No No Abnormalities Noted: No Cummiskey, Endre C. ([id number]) Callus: No Atrophie Blanche: No Crepitus: No Cyanosis: No Excoriation: No Ecchymosis: No Induration: No Erythema: Yes Rash: No Erythema Location: Circumferential Scarring: No Hemosiderin Staining: No Mottled: No Moisture Pallor: No No Abnormalities Noted: No Rubor: No Dry / Scaly: No Maceration: No Temperature / Pain Temperature: No Abnormality Tenderness on Palpation: Yes Wound Preparation Ulcer Cleansing: Rinsed/Irrigated with Saline Topical Anesthetic Applied: Other: lidocaine 4%, Electronic Signature(s) Signed: 04/20/2018 3:51:06 PM By: Secundino Ginger Entered By: Secundino Ginger on 04/20/2018 15:37:49 Pardon, Reyaansh C. ([id number]) -------------------------------------------------------------------------------- Wound Assessment Details Patient Name: Walls, Jammie C. Date of Service: 04/20/2018 3:00 PM Medical Record Number: [id number] Patient Account Number: [id number] Date of Birth/Sex: 01-23-1941 (76 y.o. M) Treating RN: Ahmed Prima Primary Care Dwanda Tufano: Park Liter Other Clinician: Referring Relena Ivancic: Park Liter Treating Glendal Cassaday/Extender: Lawanda Cousins Weeks in Treatment: 2 Wound Status Wound Number: 4 Primary Etiology: Skin Tear Wound Location: Left Forearm Wound Status: Healed - Epithelialized Wounding Event: Trauma Comorbid History: Type II Diabetes Date Acquired: 04/03/2018 Weeks Of Treatment: 1 Clustered Wound: No Photos Photo Uploaded By: Secundino Ginger on 04/20/2018 15:50:33 Wound Measurements Length: (cm) 0 % Reduc Width: (cm) 0 % Reduc Depth: (cm) 0 Epithel Area: (cm) 0 Tunnel Volume: (cm) 0 Underm tion in Area: 100% tion in Volume: 100% ialization: Medium (34-66%) ing: No ining: No Wound Description Classification: Partial Thickness Foul O Wound  Margin: Flat and Intact Slough Exudate Amount: Small Exudate Type: Serous Exudate Color: amber dor After Cleansing: No /Fibrino No Wound Bed Granulation Amount: None Present (0%) Exposed Structure Necrotic Amount: None Present (0%) Fascia Exposed: No Fat Layer (Subcutaneous Tissue) Exposed: No Tendon Exposed: No Muscle Exposed: No Joint Exposed: No Bone Exposed: No Limited to Skin Breakdown Periwound Skin Texture Prim, Wendle C. ([id number]) Texture Color No Abnormalities Noted: No No Abnormalities Noted: No Callus: No Atrophie Blanche: No Crepitus: No Cyanosis: No Excoriation: No Ecchymosis: No Induration: No Erythema: No Rash: No Hemosiderin Staining: No Scarring: No Mottled: No Pallor: No Moisture Rubor: No No Abnormalities Noted: No Dry / Scaly: No Maceration: No Wound Preparation Ulcer Cleansing: Rinsed/Irrigated with Saline Electronic Signature(s) Signed: 04/21/2018 11:31:29 AM By: Alric Quan Previous Signature: 04/20/2018 3:51:06 PM Version By: Secundino Ginger Entered By: Alric Quan on 04/20/2018 16:00:44 Carvell, Wales ([id number]) -------------------------------------------------------------------------------- Vitals Details Patient Name: [name] Date of Service: 04/20/2018 3:00 PM Medical Record Number: [id number] Patient Account Number: [id number] Date of Birth/Sex: [birth year] (76 y.o. M) Treating RN: Secundino Ginger Primary Care Beyonka Pitney: Park Liter Other Clinician: Referring Vane Yapp: Park Liter Treating Jashayla Glatfelter/Extender: Cathie Olden in Treatment: 2 Vital Signs Time Taken: 15:20 Temperature (F): 99.1 Height (in): 67 Pulse (bpm): 81 Weight (lbs): 158 Respiratory Rate (breaths/min): 18 Body Mass Index (BMI): 24.7 Blood Pressure (mmHg): 146/54 Reference Range: 80 - 120 mg / dl Electronic Signature(s) Signed: 04/20/2018  3:51:06 PM By: Secundino Ginger Entered BySecundino Ginger on 04/20/2018 15:26:05

## 2018-04-22 NOTE — Progress Notes (Signed)
CHANE, COWDEN ([id number]) Visit Report for 04/20/2018 Chief Complaint Document Details Patient Name: [name], [name] Date of Service: 04/20/2018 3:00 PM Medical Record Number: [id number] Patient Account Number: [id number] Date of Birth/Sex: [birth year] (77 y.o. M) Treating RN: Ahmed Prima Primary Care Provider: Park Liter Other Clinician: Referring Provider: Park Liter Treating Provider/Extender: Cathie Olden in Treatment: 2 Information Obtained from: Patient Chief Complaint Left gluteal ulcer Electronic Signature(s) Signed: 04/20/2018 4:41:53 PM By: Lawanda Cousins Entered By: Lawanda Cousins on 04/20/2018 16:41:53 Maceachern, Tytus C. ([id number]) -------------------------------------------------------------------------------- HPI Details Patient Name: [name] Date of Service: 04/20/2018 3:00 PM Medical Record Number: [id number] Patient Account Number: [id number] Date of Birth/Sex: Sep 22, 1941 (77 y.o. M) Treating RN: Ahmed Prima Primary Care Provider: Park Liter Other Clinician: Referring Provider: Park Liter Treating Provider/Extender: Cathie Olden in Treatment: 2 History of Present Illness HPI Description: 07/08/17 on evaluation today patient presents initially for issues that he has been having with bilateral lower extremities this has been going on for the best I can tell "about a month" he has been seen by his primary care provider at [facility] family practice and they have been performing wraps. They also did get him set up with home health although there was confusion about what company was providing the home health for him. Unfortunately patient with a poor historian and it was difficult to gather information about how this has really progressed over the past month. Nonetheless he does have erythema noted of the right lower extremity and ulceration at this location as well. He has what appears to be lymphedema noted of the bilateral lower  extremities and the left forefoot region is macerated potentially with a fungal infection noted as well. No fevers, chills, nausea, or vomiting noted at this time. As best I can gather patient has a history of hypothyroidism, hypertension, medicinal and antinausea medication he tells me due to the fact that he has been on the antibiotic. 07/22/2017 -- I'm reviewing this 77 year old gentleman for the first time today, and notes that he was recently in hospital but he got himself discharge AMA, and during this time he was treated for acute hyponatremia, cellulitis and bilateral leg ulceration with a history of alcohol abuse. The patient's hemoglobin A1c was 5.1 the last time he was in hospital and he was also noted to have cellulitis of the lower extremity and since he could not tolerate Augmentin was started on Bactrim. the patient and his son both have limited understanding and knowledge and it seems quite difficult to make them understand the treatment plan 08/05/17 on evaluation today patient's wound appears to be healed and he is doing very well. Unfortunately we have been unable to obtain the compression stockings for him at this point. We will discuss it further and plan. Otherwise he has no other concerns. Readmission: 03/31/18 on evaluation today patient is seen back in our clinic for a different issue than what we previously have evaluated him for. 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 He states this only seems to hurt when he sits and unfortunately he tells me that he sits the majority of his day in his chair. I did ask him about whether or not he had accounts that he can [place] he states he does but he never lays on it. He does have multiple myeloma and is currently under treatment for this. With that being said he also tells [place] that  he just eats peanut butter sandwiches and boiled eggs each day and drinks  beer. When directly asked if each anything else he says no. Nonetheless it does appear that the wound itself is fairly superficial and I think could actually heal very nicely with appropriate treatment and therapy. 04/07/18 on evaluation today patient actually appears to be doing about the same in regard to the sacral ulcer. In regard to his ulcer on the left forearm which is new and secondary to fall this appears to be skin tear. He does have some issues with balance specifically related to a recent medication that he was placed on. The good news is this was discontinued a week ago nonetheless he did have a fall which was about a week ago causing the skin tear. There is some hematoma underneath the flap which is actually necrotic and does not appear to be reattaching. 04/10/18 on evaluation today patient's wounds actually appear to be showing some signs of improvement. Unfortunately it sounds like he is not eating appropriately at this point according to his son who was present during evaluation today as well. I did inquire the patient as to why this is he states "I'm just not hungry". He has been tolerating the dressing changes without complication he does not seem to have any evidence of infection at this point. Overall I'm fairly pleased with the progress that has been made although again I think it's lower than it could be if you would stay off of his bottom and eat more appropriately I suspect he is probably malnourished. 04/20/18-He is seen in evaluation for left buttock ulcer. The left forearm is healed. He does bring to my attention an area of dry Plott, Zaidyn C. ([id number]) flaking skin to his left scalp; he will be referred to dermatology for evaluation of possible psoriasis, needing prescription shampoo. We will continue with same treatment plan to his left buttock and he will follow-up in one week Electronic Signature(s) Signed: 04/20/2018 4:43:46 PM By: Lawanda Cousins Entered By:  Lawanda Cousins on 04/20/2018 16:43:45 Haddon, Donnie Coffin ([id number]) -------------------------------------------------------------------------------- Physician Orders Details Patient Name: [name] Date of Service: 04/20/2018 3:00 PM Medical Record Number: [id number] Patient Account Number: [id number] Date of Birth/Sex: [birth year] (77 y.o. M) Treating RN: Ahmed Prima Primary Care Provider: Park Liter Other Clinician: Referring Provider: Park Liter Treating Provider/Extender: Cathie Olden in Treatment: 2 Verbal / Phone Orders: Yes Clinician: Pinkerton, Debi Read Back and Verified: Yes Diagnosis Coding Wound Cleansing Wound #3 Left Gluteus o Clean wound with Normal Saline. o Cleanse wound with mild soap and water o May Shower, gently pat wound dry prior to applying new dressing. Anesthetic (add to Medication List) Wound #3 Left Gluteus o Topical Lidocaine 4% cream applied to wound bed prior to debridement (In Clinic Only). Primary Wound Dressing Wound #3 Left Gluteus o Silver Collagen - in clinic Secondary Dressing Wound #3 Left Gluteus o Boardered Foam Dressing Dressing Change Frequency Wound #3 Left Gluteus o Change Dressing Monday, Wednesday, Friday Follow-up Appointments Wound #3 Left Gluteus o Return Appointment in 1 week. Off-Loading Wound #3 Left Gluteus o Turn and reposition every 2 hours Additional Orders / Instructions Wound #3 Left Gluteus o Vitamin A; Vitamin C, Zinc - Please add a multivitamin that has vitamin A, vitamin C and zinc in it o Increase protein intake. o Activity as tolerated Home Health Wound #3 Left [place] Nurse may visit PRN to address patientos wound care needs.  DERRIOUS, BOLOGNA ([id number]) o FACE TO FACE ENCOUNTER: MEDICARE and MEDICAID PATIENTS: I certify that this patient is under my care and that I had a face-to-face encounter that meets the  physician face-to-face encounter requirements with this patient on this date. The encounter with the patient was in whole or in part for the following MEDICAL CONDITION: (primary reason for [place]) MEDICAL NECESSITY: I certify, that based on my findings, NURSING services are a medically necessary home health service. HOME BOUND STATUS: I certify that my clinical findings support that this patient is homebound (i.e., Due to illness or injury, pt requires aid of supportive devices such as crutches, cane, wheelchairs, walkers, the use of special transportation or the assistance of another person to leave their place of residence. There is a normal inability to leave the home and doing so requires considerable and taxing effort. Other absences are for medical reasons / religious services and are infrequent or of short duration when for other reasons). o If current dressing causes regression in wound condition, may D/C ordered dressing product/s and apply Normal Saline Moist Dressing daily until next [place] / Other MD appointment. [place] of regression in wound condition at [phone]. o Please direct any NON-WOUND related issues/requests for orders to patient's Primary Care Physician Consults o Dermatology - for scalp and skin Patient Medications Allergies: No Known Drug Allergies Notifications Medication Indication Start End lidocaine DOSE 1 - topical 4 % cream - 1 cream topical Electronic Signature(s) Signed: 04/20/2018 9:10:59 PM By: Lawanda Cousins Signed: 04/21/2018 11:31:29 AM By: Alric Quan Entered By: Alric Quan on 04/20/2018 16:08:10 Coste, Donnie Coffin ([id number]) -------------------------------------------------------------------------------- Prescription 04/20/2018 Patient Name: [name] Provider: Lawanda Cousins NP Date of Birth: October 22, 1940 NPI#: [id number] Sex: Jerilynn Mages DEA#: [id number] Phone #: [phone] License  #: Patient Address: [place], [place] [address] [address] [place], [place] [address] [phone] Allergies No Known Drug Allergies Medication Medication: Route: Strength: Form: lidocaine 4 % topical cream topical 4% cream Class: TOPICAL LOCAL ANESTHETICS Dose: Frequency / Time: Indication: 1 1 cream topical Number of Refills: Number of Units: 0 Generic Substitution: Start Date: End Date: One Time Use: Substitution Permitted No Note to Pharmacy: Signature(s): Date(s): Electronic Signature(s) Signed: 04/20/2018 9:10:59 PM By: Lawanda Cousins Signed: 04/21/2018 11:31:29 AM By: Alric Quan Entered By: Alric Quan on 04/20/2018 [place], [place]. ([id number]) --------------------------------------------------------------------------------  Problem List Details Patient Name: Reidel, Mykeal C. Date of Service: 04/20/2018 3:00 PM Medical Record Number: [id number] Patient Account Number: [id number] Date of Birth/Sex: Jun 07, 1941 (77 y.o. 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 ([id number]) -------------------------------------------------------------------------------- Progress Note Details Patient Name: Laffoon, Jacori C. Date of Service: 04/20/2018 3:00 PM Medical Record Number: [id number] Patient Account Number: [id number] Date of Birth/Sex: 1940/12/28 (77 y.o. M) Treating RN:  Carolyne Fiscal, Debi Primary Care Provider: Park Liter Other Clinician: Referring Provider: Park Liter Treating Provider/Extender: Cathie Olden in Treatment: 2 Subjective Chief Complaint Information obtained from Patient Left gluteal ulcer History of Present Illness (HPI) 07/08/17 on evaluation today patient presents initially for issues that he has been having with bilateral lower extremities this has been going on for the best I can tell "about a month" he has been seen by his primary care provider at [facility] family practice and they have been performing wraps. They also did get him set up with home health although there was confusion about what company was providing the home health for him. Unfortunately patient with a poor historian and it was difficult to gather information about how this has really progressed over the past month. Nonetheless he does have erythema noted of the right lower extremity and ulceration at this location as well. He has what appears to be lymphedema noted of the bilateral lower extremities and the left forefoot region is macerated potentially with a fungal infection noted as well. No fevers, chills, nausea, or vomiting noted at this time. As best I can gather patient has a history of hypothyroidism, hypertension, medicinal and antinausea medication he tells me due to the fact that he has been on the antibiotic. 07/22/2017 -- I'm reviewing this 77 year old gentleman for the first time today, and notes that he was recently in hospital but he got himself discharge AMA, and during this time he was treated for acute hyponatremia,  cellulitis and bilateral leg ulceration with a history of alcohol abuse. The patient's hemoglobin A1c was 5.1 the last time he was in hospital and he was also noted to have cellulitis of the lower extremity and since he could not tolerate Augmentin was started on Bactrim. the patient and his son both have limited understanding and knowledge and it seems quite difficult to make them understand the treatment plan 08/05/17 on evaluation today patient's wound appears to be healed and he is doing very well. Unfortunately we have been unable to obtain the compression stockings for him at this point. We will discuss it further and plan. Otherwise he has no other concerns. Readmission: 03/31/18 on evaluation today patient is seen back in our clinic for a different issue than what we previously have evaluated him for. 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 He states this only seems to hurt when he sits and unfortunately he tells me that he sits the majority of his day in his chair. I did ask him about whether or not he had accounts that he can [place] he states he does but he never lays on it. He does have multiple myeloma and is currently under treatment for this. With that being said he also tells [place] that he just eats peanut butter sandwiches and boiled eggs each day and drinks beer. When directly asked if each anything else he says no. Nonetheless it does appear that the wound itself is fairly superficial and I think could actually heal very nicely with appropriate treatment and therapy. 04/07/18 on evaluation today patient actually appears to be doing about the same in regard to the sacral ulcer. In regard to his ulcer on the left forearm which is new and secondary to fall this appears to be skin tear. He does have some issues with balance specifically related to a recent medication that he was placed on. The good news is this  was discontinued a week ago nonetheless he did have a fall which was about a week ago causing the skin tear. There is some hematoma underneath the flap which is actually necrotic and does not appear to be reattaching. 04/10/18 on evaluation today patient's wounds actually appear to be showing some signs of improvement. Unfortunately it Ashraf, Dorian C. ([id number]) sounds like he is not eating appropriately at this point according to his son who was present during evaluation today as well. I did inquire the patient as to why this is he states "I'm just not hungry". He has been tolerating the dressing changes without complication he does not seem to have any evidence of infection at this point. Overall I'm fairly pleased with the progress that has been made although again I think it's lower than it could be if you would stay off of his bottom and eat more appropriately I suspect he is probably malnourished. 04/20/18-He is seen in evaluation for left buttock ulcer. The left forearm is healed. He does bring to my attention an area of dry flaking skin to his left scalp; he will be referred to dermatology for evaluation of possible psoriasis, needing prescription shampoo. We will continue with same treatment plan to his left buttock and he will follow-up in one week Patient History Unable to Obtain Patient History due to Altered Mental Status. Information obtained from Patient. Social History Former smoker, Marital Status - Married, Alcohol Use - Daily, Drug Use - No History, Caffeine Use - Rarely. Medical And Surgical History Notes Oncologic multiple myeloma Objective Constitutional Vitals Time Taken: 3:20 PM, Height: 67 in, Weight: 158 lbs, BMI: 24.7, Temperature: 99.1 F, Pulse: 81 bpm, Respiratory Rate: 18 breaths/min, Blood Pressure: 146/54 mmHg. Integumentary (Hair, Skin) Wound #3 status is Open. Original cause of wound was Pressure Injury. The wound is located on the Left Gluteus. The wound  measures 0.5cm length x 0.6cm width x 0.1cm depth; 0.236cm^2 area and 0.024cm^3 volume. There is Fat Layer (Subcutaneous Tissue) Exposed exposed. There is no tunneling or undermining noted. There is a none present amount of drainage noted. The wound margin is flat and intact. There is small (1-33%) pink granulation within the wound bed. There is a medium (34-66%) amount of necrotic tissue within the wound bed. The periwound skin appearance exhibited: Erythema. The periwound skin appearance did not exhibit: Callus, Crepitus, Excoriation, Induration, Rash, Scarring, Dry/Scaly, Maceration, Atrophie Blanche, Cyanosis, Ecchymosis, Hemosiderin Staining, Mottled, Pallor, Rubor. The surrounding wound skin color is noted with erythema which is circumferential. Periwound temperature was noted as No Abnormality. The periwound has tenderness on palpation. Wound #4 status is Healed - Epithelialized. Original cause of wound was Trauma. The wound is located on the Left Forearm. The wound measures 0cm length x 0cm width x 0cm depth; 0cm^2 area and 0cm^3 volume. The wound is limited to skin breakdown. There is no tunneling or undermining noted. There is a small amount of serous drainage noted. The wound margin is flat and intact. There is no granulation within the wound bed. There is no necrotic tissue within the wound bed. The periwound skin appearance did not exhibit: Callus, Crepitus, Excoriation, Induration, Rash, Scarring, Dry/Scaly, Maceration, Atrophie Blanche, Cyanosis, Ecchymosis, Hemosiderin Staining, Mottled, Pallor, Rubor, Erythema. EHAN, FREAS ([id number]) Assessment Active Problems ICD-10 Lymphedema, not elsewhere classified Peripheral vascular disease, unspecified Pressure ulcer of left buttock, stage 3 Multiple myeloma not having achieved remission Plan Wound Cleansing: Wound #3 Left Gluteus: Clean wound with Normal Saline. Cleanse wound with mild soap and  water May Shower, gently pat  wound dry prior to applying new dressing. Anesthetic (add to Medication List): Wound #3 Left Gluteus: Topical Lidocaine 4% cream applied to wound bed prior to debridement (In Clinic Only). Primary Wound Dressing: Wound #3 Left Gluteus: Silver Collagen - in clinic Secondary Dressing: Wound #3 Left Gluteus: Boardered Foam Dressing Dressing Change Frequency: Wound #3 Left Gluteus: Change Dressing Monday, Wednesday, Friday Follow-up Appointments: Wound #3 Left Gluteus: Return Appointment in 1 week. Off-Loading: Wound #3 Left Gluteus: Turn and reposition every 2 hours Additional Orders / Instructions: Wound #3 Left Gluteus: Vitamin A; Vitamin C, Zinc - Please add a multivitamin that has vitamin A, vitamin C and zinc in it Increase protein intake. Activity as tolerated Home Health: Wound #3 Left Gluteus: [place] Nurse may visit PRN to address patient s wound care needs. FACE TO FACE ENCOUNTER: MEDICARE and MEDICAID PATIENTS: I certify that this patient is under my care and that I had a face-to-face encounter that meets the physician face-to-face encounter requirements with this patient on this date. The encounter with the patient was in whole or in part for the following MEDICAL CONDITION: (primary reason for [place]) MEDICAL NECESSITY: I certify, that based on my findings, NURSING services are a medically necessary home health service. HOME BOUND STATUS: I certify that my clinical findings support that this patient is homebound (i.e., Due to illness or injury, pt requires aid of supportive devices such as crutches, cane, wheelchairs, walkers, the use of special transportation or the assistance of another person to leave their place of residence. There is a normal inability to leave the [place], [place]. ([id number]) home and doing so requires considerable and taxing effort. Other absences are for medical reasons / religious services and are  infrequent or of short duration when for other reasons). If current dressing causes regression in wound condition, may D/C ordered dressing product/s and apply Normal Saline Moist Dressing daily until next [place] / Other MD appointment. [place] of regression in wound condition at [phone]. Please direct any NON-WOUND related issues/requests for orders to patient's Primary Care Physician Consults ordered were: Dermatology - for scalp and skin The following medication(s) was prescribed: lidocaine topical 4 % cream 1 1 cream topical was prescribed at facility Electronic Signature(s) Signed: 04/20/2018 4:44:05 PM By: Lawanda Cousins Entered By: Lawanda Cousins on 04/20/2018 16:44:05 Hammett, Donnie Coffin ([id number]) -------------------------------------------------------------------------------- ROS/PFSH Details Patient Name: [name] Date of Service: 04/20/2018 3:00 PM Medical Record Number: [id number] Patient Account Number: [id number] Date of Birth/Sex: 1941/01/17 (77 y.o. M) Treating RN: Ahmed Prima Primary Care Provider: Park Liter Other Clinician: Referring Provider: Park Liter Treating Provider/Extender: Cathie Olden in Treatment: 2 Unable to Obtain Patient History due to oo Altered Mental Status Information Obtained From Patient Wound History Do you currently have one or more open woundso Yes How many open wounds do you currently haveo 1 Approximately how long have you had your woundso 2 weeks How have you been treating your wound(s) until nowo vaseline Has your wound(s) ever healed and then re-openedo No Have you had any lab work done in the past montho No Have you tested positive for an antibiotic resistant organism (MRSA, VRE)o No Have you tested positive for osteomyelitis (bone infection)o No Have you had any tests for circulation on your legso No Have you had other problems associated with your woundso  Infection Endocrine Medical History: Positive for: Type II Diabetes Treated with: Oral agents  Blood sugar tested every day: No Oncologic Medical History: Past Medical History Notes: multiple myeloma Immunizations Pneumococcal Vaccine: Received Pneumococcal Vaccination: No Implantable Devices Family and Social History Former smoker; Marital Status - Married; Alcohol Use: Daily; Drug Use: No History; Caffeine Use: Rarely; Advanced Directives: No; Patient does not want information on Advanced Directives; Do not resuscitate: No; Living Will: No; Medical Power of Attorney: No Physician Affirmation I have reviewed and agree with the above information. Electronic Signature(s) Signed: 04/20/2018 9:10:59 PM By: Lawanda Cousins Signed: 04/21/2018 11:31:29 AM By: Nolon Lennert, Donnie Coffin ([id number]) Entered By: Lawanda Cousins on 04/20/2018 16:43:55 Hochstetler, Donnie Coffin ([id number]) -------------------------------------------------------------------------------- SuperBill Details Patient Name: Escamilla, Shamarcus C. Date of Service: 04/20/2018 Medical Record Number: [id number] Patient Account Number: [id number] Date of Birth/Sex: 1941/05/28 (77 y.o. 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

## 2018-04-24 ENCOUNTER — Telehealth: Payer: Self-pay | Admitting: Family Medicine

## 2018-04-24 NOTE — Telephone Encounter (Signed)
Noted, will leave for Dr. Wynetta Emery to review

## 2018-04-24 NOTE — Telephone Encounter (Signed)
Copied from [place] [id number]. Topic: Quick Communication - See Telephone Encounter >> Apr 24, 2018  2:34 PM Bea Graff, NT wrote: CRM for notification. See Telephone encounter for: 04/24/18. Tiffany with [facility] calling and states pt did not answer her calls last Wednesaday and Thursday. Friday patient refused care and stated he had something to do. Today phone going to voicemail. If they are unable to get the pt this Wednesday they will have to discharge him. CB#: [phone]

## 2018-04-25 DIAGNOSIS — E538 Deficiency of other specified B group vitamins: Secondary | ICD-10-CM | POA: Diagnosis not present

## 2018-04-25 DIAGNOSIS — N181 Chronic kidney disease, stage 1: Secondary | ICD-10-CM | POA: Diagnosis not present

## 2018-04-25 DIAGNOSIS — E039 Hypothyroidism, unspecified: Secondary | ICD-10-CM | POA: Diagnosis not present

## 2018-04-25 DIAGNOSIS — I129 Hypertensive chronic kidney disease with stage 1 through stage 4 chronic kidney disease, or unspecified chronic kidney disease: Secondary | ICD-10-CM | POA: Diagnosis not present

## 2018-04-25 DIAGNOSIS — E1122 Type 2 diabetes mellitus with diabetic chronic kidney disease: Secondary | ICD-10-CM | POA: Diagnosis not present

## 2018-04-25 DIAGNOSIS — E1140 Type 2 diabetes mellitus with diabetic neuropathy, unspecified: Secondary | ICD-10-CM | POA: Diagnosis not present

## 2018-04-25 DIAGNOSIS — E1165 Type 2 diabetes mellitus with hyperglycemia: Secondary | ICD-10-CM | POA: Diagnosis not present

## 2018-04-25 DIAGNOSIS — C9000 Multiple myeloma not having achieved remission: Secondary | ICD-10-CM | POA: Diagnosis not present

## 2018-04-25 NOTE — Telephone Encounter (Signed)
Noted  

## 2018-04-26 DIAGNOSIS — C9000 Multiple myeloma not having achieved remission: Secondary | ICD-10-CM | POA: Diagnosis not present

## 2018-04-26 DIAGNOSIS — E1122 Type 2 diabetes mellitus with diabetic chronic kidney disease: Secondary | ICD-10-CM | POA: Diagnosis not present

## 2018-04-26 DIAGNOSIS — N181 Chronic kidney disease, stage 1: Secondary | ICD-10-CM | POA: Diagnosis not present

## 2018-04-26 DIAGNOSIS — I129 Hypertensive chronic kidney disease with stage 1 through stage 4 chronic kidney disease, or unspecified chronic kidney disease: Secondary | ICD-10-CM | POA: Diagnosis not present

## 2018-04-26 DIAGNOSIS — E1140 Type 2 diabetes mellitus with diabetic neuropathy, unspecified: Secondary | ICD-10-CM | POA: Diagnosis not present

## 2018-04-26 DIAGNOSIS — E039 Hypothyroidism, unspecified: Secondary | ICD-10-CM | POA: Diagnosis not present

## 2018-04-26 DIAGNOSIS — E538 Deficiency of other specified B group vitamins: Secondary | ICD-10-CM | POA: Diagnosis not present

## 2018-04-26 DIAGNOSIS — E1165 Type 2 diabetes mellitus with hyperglycemia: Secondary | ICD-10-CM | POA: Diagnosis not present

## 2018-04-27 DIAGNOSIS — E538 Deficiency of other specified B group vitamins: Secondary | ICD-10-CM | POA: Diagnosis not present

## 2018-04-27 DIAGNOSIS — I129 Hypertensive chronic kidney disease with stage 1 through stage 4 chronic kidney disease, or unspecified chronic kidney disease: Secondary | ICD-10-CM | POA: Diagnosis not present

## 2018-04-27 DIAGNOSIS — E1140 Type 2 diabetes mellitus with diabetic neuropathy, unspecified: Secondary | ICD-10-CM | POA: Diagnosis not present

## 2018-04-27 DIAGNOSIS — C9000 Multiple myeloma not having achieved remission: Secondary | ICD-10-CM | POA: Diagnosis not present

## 2018-04-27 DIAGNOSIS — E1122 Type 2 diabetes mellitus with diabetic chronic kidney disease: Secondary | ICD-10-CM | POA: Diagnosis not present

## 2018-04-27 DIAGNOSIS — N181 Chronic kidney disease, stage 1: Secondary | ICD-10-CM | POA: Diagnosis not present

## 2018-04-27 DIAGNOSIS — E1165 Type 2 diabetes mellitus with hyperglycemia: Secondary | ICD-10-CM | POA: Diagnosis not present

## 2018-04-27 DIAGNOSIS — E039 Hypothyroidism, unspecified: Secondary | ICD-10-CM | POA: Diagnosis not present

## 2018-04-28 ENCOUNTER — Encounter: Payer: Medicare Other | Admitting: Physician Assistant

## 2018-04-28 DIAGNOSIS — E11622 Type 2 diabetes mellitus with other skin ulcer: Secondary | ICD-10-CM | POA: Diagnosis not present

## 2018-04-28 DIAGNOSIS — I890 Lymphedema, not elsewhere classified: Secondary | ICD-10-CM | POA: Diagnosis not present

## 2018-04-28 DIAGNOSIS — L89329 Pressure ulcer of left buttock, unspecified stage: Secondary | ICD-10-CM | POA: Diagnosis not present

## 2018-04-28 DIAGNOSIS — Z87891 Personal history of nicotine dependence: Secondary | ICD-10-CM | POA: Diagnosis not present

## 2018-04-28 DIAGNOSIS — L989 Disorder of the skin and subcutaneous tissue, unspecified: Secondary | ICD-10-CM | POA: Diagnosis not present

## 2018-04-28 DIAGNOSIS — E1151 Type 2 diabetes mellitus with diabetic peripheral angiopathy without gangrene: Secondary | ICD-10-CM | POA: Diagnosis not present

## 2018-04-28 DIAGNOSIS — L97919 Non-pressure chronic ulcer of unspecified part of right lower leg with unspecified severity: Secondary | ICD-10-CM | POA: Diagnosis not present

## 2018-04-28 DIAGNOSIS — L89323 Pressure ulcer of left buttock, stage 3: Secondary | ICD-10-CM | POA: Diagnosis not present

## 2018-04-28 DIAGNOSIS — C9000 Multiple myeloma not having achieved remission: Secondary | ICD-10-CM | POA: Diagnosis not present

## 2018-05-01 NOTE — Progress Notes (Signed)
FELICIA, BOTH ([id number]) Visit Report for 04/28/2018 Arrival Information Details Patient Name: [name], [name] Date of Service: 04/28/2018 9:00 AM Medical Record Number: [id number] Patient Account Number: [id number] Date of Birth/Sex: [birth year] (76 y.o. M) Treating RN: [name] Primary Care [name]: [name] Other Clinician: Referring [name]: [name] Treating [name]: [name], [name] in Treatment: 4 Visit Information History Since Last Visit All ordered tests and consults were completed: No Patient Arrived: [name] Added or deleted any medications: No Arrival Time: 09:32 Any new allergies or adverse reactions: No Accompanied By: son Had a fall or experienced change in No Transfer Assistance: None activities of daily living that may affect Patient Identification Verified: Yes risk of falls: Secondary Verification Process Completed: Yes Signs or symptoms of abuse/neglect since last visito No Hospitalized since last visit: No Implantable device outside of the clinic excluding No cellular tissue based products placed in the center since last visit: Pain Present Now: No Electronic Signature(s) Signed: 04/28/2018 4:43:15 PM By: [name] Entered By: [name] on 04/28/2018 09:32:38 [name] ([id number]) -------------------------------------------------------------------------------- Clinic Level of Care Assessment Details Patient Name: [name], [name] Date of Service: 04/28/2018 9:00 AM Medical Record Number: [id number] Patient Account Number: [id number] Date of Birth/Sex: [birth year] (76 y.o. M) Treating RN: [name] Primary Care [name]: [name] Other Clinician: Referring [name]: [name] Treating [name]/Extender: [name], [name] in Treatment: 4 Clinic Level of Care Assessment Items TOOL 4 Quantity Score []  - Use when only an EandM is performed on FOLLOW-UP visit 0 ASSESSMENTS - Nursing  Assessment / Reassessment X - Reassessment of Co-morbidities (includes updates in patient status) 1 10 X- 1 5 Reassessment of Adherence to Treatment Plan ASSESSMENTS - Wound and Skin Assessment / Reassessment X - Simple Wound Assessment / Reassessment - one wound 1 5 []  - 0 Complex Wound Assessment / Reassessment - multiple wounds X- 1 10 Dermatologic / Skin Assessment (not related to wound area) ASSESSMENTS - Focused Assessment []  - Circumferential Edema Measurements - multi extremities 0 []  - 0 Nutritional Assessment / Counseling / Intervention []  - 0 Lower Extremity Assessment (monofilament, tuning fork, pulses) []  - 0 Peripheral Arterial Disease Assessment (using hand held doppler) ASSESSMENTS - Ostomy and/or Continence Assessment and Care []  - Incontinence Assessment and Management 0 []  - 0 Ostomy Care Assessment and Management (repouching, etc.) PROCESS - Coordination of Care X - Simple Patient / Family Education for ongoing care 1 15 []  - 0 Complex (extensive) Patient / Family Education for ongoing care []  - 0 Staff obtains Programmer, systems, Records, Test Results / Process Orders []  - 0 Staff telephones HHA, Nursing Homes / Clarify orders / etc []  - 0 Routine Transfer to another Facility (non-emergent condition) []  - 0 Routine Hospital Admission (non-emergent condition) []  - 0 New Admissions / Biomedical engineer / Ordering NPWT, Apligraf, etc. []  - 0 Emergency Hospital Admission (emergent condition) X- 1 10 Simple Discharge Coordination [name], [name] ([id number]) []  - 0 Complex (extensive) Discharge Coordination PROCESS - Special Needs []  - Pediatric / Minor Patient Management 0 []  - 0 Isolation Patient Management []  - 0 Hearing / Language / Visual special needs []  - 0 Assessment of Community assistance (transportation, D/C planning, etc.) []  - 0 Additional assistance / Altered mentation []  - 0 Support Surface(s) Assessment (bed, cushion, seat,  etc.) INTERVENTIONS - Wound Cleansing / Measurement X - Simple Wound Cleansing - one wound 1 5 []  - 0 Complex Wound Cleansing - multiple wounds X- 1 5 Wound  Imaging (photographs - any number of wounds) []  - 0 Wound Tracing (instead of photographs) X- 1 5 Simple Wound Measurement - one wound []  - 0 Complex Wound Measurement - multiple wounds INTERVENTIONS - Wound Dressings []  - Small Wound Dressing one or multiple wounds 0 []  - 0 Medium Wound Dressing one or multiple wounds []  - 0 Large Wound Dressing one or multiple wounds []  - 0 Application of Medications - topical []  - 0 Application of Medications - injection INTERVENTIONS - Miscellaneous []  - External ear exam 0 []  - 0 Specimen Collection (cultures, biopsies, blood, body fluids, etc.) []  - 0 Specimen(s) / Culture(s) sent or taken to Lab for analysis []  - 0 Patient Transfer (multiple staff / Civil Service fast streamer / Similar devices) []  - 0 Simple Staple / Suture removal (25 or less) []  - 0 Complex Staple / Suture removal (26 or more) []  - 0 Hypo / Hyperglycemic Management (close monitor of Blood Glucose) []  - 0 Ankle / Brachial Index (ABI) - do not check if billed separately X- 1 5 Vital Signs [name], [name] ([id number]) Has the patient been seen at the hospital within the last three years: Yes Total Score: 75 Level Of Care: New/Established - Level 2 Electronic Signature(s) Signed: 05/01/2018 2:22:32 PM By: [name] Entered By: [name] on 04/28/2018 10:42:19 [name], [name] ([id number]) -------------------------------------------------------------------------------- Encounter Discharge Information Details Patient Name: [name] Date of Service: 04/28/2018 9:00 AM Medical Record Number: [id number] Patient Account Number: [id number] Date of Birth/Sex: Feb 26, 1941 (76 y.o. M) Treating RN: [name] Primary Care Dejanira Pamintuan: [name] Other Clinician: Referring Oryn Casanova: [name] Treating  Savanah Bayles/Extender: [name], [name] in Treatment: 4 Encounter Discharge Information Items Discharge Condition: Stable Ambulatory Status: Walker Discharge Destination: Home Transportation: Private Auto Accompanied By: son Schedule Follow-up Appointment: No Clinical Summary of Care: Electronic Signature(s) Signed: 05/01/2018 2:22:32 PM By: [name] Entered By: [name] on 04/28/2018 10:16:04 Kostka, [name] ([id number]) -------------------------------------------------------------------------------- Lower Extremity Assessment Details Patient Name: Kaplan, Savino C. Date of Service: 04/28/2018 9:00 AM Medical Record Number: [id number] Patient Account Number: [id number] Date of Birth/Sex: 1941-07-24 (76 y.o. M) Treating RN: [name] Primary Care Lilyanne Mcquown: [name] Other Clinician: Referring Cailynn Bodnar: [name] Treating Archimedes Harold/Extender: [name], [name] in Treatment: 4 Electronic Signature(s) Signed: 04/28/2018 4:43:15 PM By: [name] Entered By: [name] on 04/28/2018 09:38:31 Furia, [name] ([id number]) -------------------------------------------------------------------------------- Multi Wound Chart Details Patient Name: Stcharles, Reyce C. Date of Service: 04/28/2018 9:00 AM Medical Record Number: [id number] Patient Account Number: [id number] Date of Birth/Sex: 08/06/1941 (76 y.o. M) Treating RN: [name] Primary Care Shebra Muldrow: [name] Other Clinician: Referring Chabely Norby: [name] Treating Shaneque Merkle/Extender: [name], [name] in Treatment: 4 Vital Signs Height(in): 40 Pulse(bpm): 75 Weight(lbs): 158 Blood Pressure(mmHg): 144/47 Body Mass Index(BMI): 25 Temperature(F): 98.3 Respiratory Rate 18 (breaths/min): Photos: [3:No Photos] [N/A:N/A] Wound Location: [3:Left Gluteus] [N/A:N/A] Wounding Event: [3:Pressure Injury] [N/A:N/A] Primary Etiology: [3:Pressure Ulcer] [N/A:N/A] Comorbid History:  [3:Type II Diabetes] [N/A:N/A] Date Acquired: [3:03/17/2018] [N/A:N/A] [name] of Treatment: [3:4] [N/A:N/A] Wound Status: [3:Open] [N/A:N/A] Measurements L x W x D [3:0.1x0.1x0.1] [N/A:N/A] (cm) Area (cm) : [3:0.008] [N/A:N/A] Volume (cm) : [3:0.001] [N/A:N/A] % Reduction in Area: [3:98.70%] [N/A:N/A] % Reduction in Volume: [3:98.40%] [N/A:N/A] Classification: [3:Category/Stage III] [N/A:N/A] Exudate Amount: [3:None Present] [N/A:N/A] Wound Margin: [3:Flat and Intact] [N/A:N/A] Granulation Amount: [3:None Present (0%)] [N/A:N/A] Necrotic Amount: [3:None Present (0%)] [N/A:N/A] Exposed Structures: [3:Fascia: No Fat Layer (Subcutaneous Tissue) Exposed: No Tendon: No Muscle: No Joint: No Bone: No] [  N/A:N/A] Epithelialization: [3:Small (1-33%)] [N/A:N/A] Periwound Skin Texture: [3:Excoriation: No Induration: No Callus: No Crepitus: No Rash: No Scarring: No] [N/A:N/A] Periwound Skin Moisture: [3:Maceration: No Dry/Scaly: No] [N/A:N/A] Periwound Skin Color: [3:Atrophie Blanche: No Cyanosis: No] [N/A:N/A] Ecchymosis: No Erythema: No Hemosiderin Staining: No Mottled: No Pallor: No Rubor: No Temperature: No Abnormality N/A N/A Tenderness on Palpation: Yes N/A N/A Wound Preparation: Ulcer Cleansing: N/A N/A Rinsed/Irrigated with Saline Topical Anesthetic Applied: None Treatment Notes Electronic Signature(s) Signed: 05/01/2018 2:22:32 PM By: [name] Entered By: [name] on 04/28/2018 10:10:18 [name], [name] ([id number]) -------------------------------------------------------------------------------- Multi-Disciplinary Care Plan Details Patient Name: [name] Date of Service: 04/28/2018 9:00 AM Medical Record Number: [id number] Patient Account Number: [id number] Date of Birth/Sex: 04/04/1941 (76 y.o. M) Treating RN: [name] Primary Care Mujtaba Bollig: [name] Other Clinician: Referring Gennaro Lizotte: [name] Treating Deshay Kirstein/Extender: [name],  [name] in Treatment: 4 Active Inactive Electronic Signature(s) Signed: 05/01/2018 2:22:32 PM By: [name] Entered By: [name] on 04/28/2018 10:14:00 [name], [name] ([id number]) -------------------------------------------------------------------------------- Pain Assessment Details Patient Name: [name], [name] Date of Service: 04/28/2018 9:00 AM Medical Record Number: [id number] Patient Account Number: [id number] Date of Birth/Sex: June 09, 1941 (76 y.o. M) Treating RN: [name] Primary Care Jordane Hisle: [name] Other Clinician: Referring Sherral Dirocco: [name] Treating Erskin Zinda/Extender: [name], [name] in Treatment: 4 Active Problems Location of Pain Severity and Description of Pain Patient Has Paino No Site Locations Pain Management and Medication Current Pain Management: Electronic Signature(s) Signed: 04/28/2018 4:43:15 PM By: [name] Entered By: [name] on 04/28/2018 09:32:45 [name], [name] ([id number]) -------------------------------------------------------------------------------- Patient/Caregiver Education Details Patient Name: [name] Date of Service: 04/28/2018 9:00 AM Medical Record Number: [id number] Patient Account Number: [id number] Date of Birth/Gender: September 14, 1941 (76 y.o. M) Treating RN: [name] Primary Care Physician: [name] Other Clinician: Referring Physician: Park [name] Treating Physician/Extender: Sharalyn Ink in Treatment: 4 Education Assessment Education Provided To: Patient and Caregiver son Education Topics Provided Wound/Skin Impairment: Electronic Signature(s) Signed: 05/01/2018 2:22:32 PM By: [name] Entered By: [name] on 04/28/2018 10:16:23 [name], [name] ([id number]) -------------------------------------------------------------------------------- Wound Assessment Details Patient Name: [name], [name] Date of Service: 04/28/2018 9:00  AM Medical Record Number: [id number] Patient Account Number: [id number] Date of Birth/Sex: 1941/04/16 (76 y.o. M) Treating RN: [name] Primary Care Kamar Callender: [name] Other Clinician: Referring Zenaida Tesar: [name] Treating Sigfredo Schreier/Extender: [name], [name] in Treatment: 4 Wound Status Wound Number: 3 Primary Etiology: Pressure Ulcer Wound Location: Left Gluteus Wound Status: Healed - Epithelialized Wounding Event: Pressure Injury Comorbid History: Type II Diabetes Date Acquired: 03/17/2018 [name] Of Treatment: 4 Clustered Wound: No Photos Wound Measurements Length: (cm) 0 % Reducti Width: (cm) 0 % Reducti Depth: (cm) 0 Epithelia Area: (cm) 0 Tunnelin Volume: (cm) 0 Undermin on in Area: 100% on in Volume: 100% lization: Small (1-33%) g: No ing: No Wound Description Classification: Category/Stage III Foul Odo Wound Margin: Flat and Intact Slough/F Exudate Amount: None Present r After Cleansing: No ibrino No Wound Bed Granulation Amount: None Present (0%) Exposed Structure Necrotic Amount: None Present (0%) Fascia Exposed: No Fat Layer (Subcutaneous Tissue) Exposed: No Tendon Exposed: No Muscle Exposed: No Joint Exposed: No Bone Exposed: No Periwound Skin Texture Texture Color No Abnormalities Noted: No No Abnormalities Noted: No Callus: No Atrophie Blanche: No [name], [name] ([id number]) Crepitus: No Cyanosis: No Excoriation: No Ecchymosis: No Induration: No Erythema: No Rash: No Hemosiderin Staining: No Scarring: No Mottled: No Pallor: No Moisture Rubor: No No Abnormalities  Noted: No Dry / Scaly: No Temperature / Pain Maceration: No Temperature: No Abnormality Tenderness on Palpation: Yes Wound Preparation Ulcer Cleansing: Rinsed/Irrigated with Saline Topical Anesthetic Applied: None Electronic Signature(s) Signed: 04/28/2018 4:43:15 PM By: [name] Signed: 05/01/2018 2:22:32 PM By: [name] Entered By:  [name] on 04/28/2018 16:30:45 Stitely, [name] ([id number]) -------------------------------------------------------------------------------- Vitals Details Patient Name: [name] Date of Service: 04/28/2018 9:00 AM Medical Record Number: [id number] Patient Account Number: [id number] Date of Birth/Sex: 1941/07/05 (76 y.o. M) Treating RN: [name] Primary Care [place]aclara Acklin: [name] Other Clinician: Referring Janisa Labus: [name] Treating Haydan Wedig/Extender: [name], [name] in Treatment: 4 Vital Signs Time Taken: 09:32 Temperature (F): 98.3 Height (in): 67 Pulse (bpm): 75 Weight (lbs): 158 Respiratory Rate (breaths/min): 18 Body Mass Index (BMI): 24.7 Blood Pressure (mmHg): 144/47 Reference Range: 80 - 120 mg / dl Electronic Signature(s) Signed: 04/28/2018 4:43:15 PM By: [name] Entered By: [name] on 04/28/2018 09:33:09

## 2018-05-01 NOTE — Progress Notes (Signed)
[name] [name] ([id number]) Visit Report for 04/28/2018 Chief Complaint Document Details Patient Name: [name] [name] REPINSKI. Date of Service: 04/28/2018 9:00 AM Medical Record Number: [id number] Patient Account Number: [id number] Date of Birth/Sex: [birth year] (77 y.o. M) Treating RN: [name] [name] Primary Care Provider: Park [name] Other Clinician: Referring Provider: Park [name] Treating Provider/Extender: [name] [name] Weeks in Treatment: 4 Information Obtained from: Patient Chief Complaint Left gluteal ulcer Electronic Signature(s) Signed: 04/29/2018 1:32:06 AM By: Martin Keeler PA-C Entered By: [name] [name] on 04/28/2018 09:54:27 [name] [name] ([id number]) -------------------------------------------------------------------------------- HPI Details Patient Name: [name] [name]. Date of Service: 04/28/2018 9:00 AM Medical Record Number: [id number] Patient Account Number: [id number] Date of Birth/Sex: 1941-09-17 (77 y.o. M) Treating RN: [name] [name] Primary Care Provider: Park [name] Other Clinician: Referring Provider: Park [name] Treating Provider/Extender: [name] [name] Weeks in Treatment: 4 History of Present Illness HPI Description: 07/08/17 on evaluation today patient presents initially for issues that he has been having with bilateral lower extremities this has been going on for the best I can tell "about a month" he has been seen by his primary care provider at [facility] family practice and they have been performing wraps. They also did get him set up with home health although there was confusion about what company was providing the home health for him. Unfortunately patient with a poor historian and it was difficult to gather information about how this has really progressed over the past month. Nonetheless he does have erythema noted of the right lower extremity and ulceration at this location as well. He has what appears to be lymphedema noted of the  bilateral lower extremities and the left forefoot region is macerated potentially with a fungal infection noted as well. No fevers, chills, nausea, or vomiting noted at this time. As best I can gather patient has a history of hypothyroidism, hypertension, medicinal and antinausea medication he tells me due to the fact that he has been on the antibiotic. 07/22/2017 -- I'm reviewing this 77 year old gentleman for the first time today, and notes that he was recently in hospital but he got himself discharge AMA, and during this time he was treated for acute hyponatremia, cellulitis and bilateral leg ulceration with a history of alcohol abuse. The patient's hemoglobin A1c was 5.1 the last time he was in hospital and he was also noted to have cellulitis of the lower extremity and since he could not tolerate Augmentin was started on Bactrim. the patient and his son both have limited understanding and knowledge and it seems quite difficult to make them understand the treatment plan 08/05/17 on evaluation today patient's wound appears to be healed and he is doing very well. Unfortunately we have been unable to obtain the compression stockings for him at this point. We will discuss it further and plan. Otherwise he has no other concerns. Readmission: 03/31/18 on evaluation today patient is seen back in our clinic for a different issue than what we previously have evaluated him for. 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 He states this only seems to hurt when he sits and unfortunately he tells me that he sits the majority of his day in his chair. I did ask him about whether or not he had accounts that he can [place] he states he does but he never lays on it. He does have multiple myeloma and is currently under treatment for this. With that being said  he also tells [place] that he just eats peanut butter sandwiches and boiled eggs each day  and drinks beer. When directly asked if each anything else he says no. Nonetheless it does appear that the wound itself is fairly superficial and I think could actually heal very nicely with appropriate treatment and therapy. 04/07/18 on evaluation today patient actually appears to be doing about the same in regard to the sacral ulcer. In regard to his ulcer on the left forearm which is new and secondary to fall this appears to be skin tear. He does have some issues with balance specifically related to a recent medication that he was placed on. The good news is this was discontinued a week ago nonetheless he did have a fall which was about a week ago causing the skin tear. There is some hematoma underneath the flap which is actually necrotic and does not appear to be reattaching. 04/10/18 on evaluation today patient's wounds actually appear to be showing some signs of improvement. Unfortunately it sounds like he is not eating appropriately at this point according to his son who was present during evaluation today as well. I did inquire the patient as to why this is he states "I'm just not hungry". He has been tolerating the dressing changes without complication he does not seem to have any evidence of infection at this point. Overall I'm fairly pleased with the progress that has been made although again I think it's lower than it could be if you would stay off of his bottom and eat more appropriately I suspect he is probably malnourished. 04/20/18-He is seen in evaluation for left buttock ulcer. The left forearm is healed. He does bring to my attention an area of dry Molinelli, Amarrion C. ([id number]) flaking skin to his left scalp; he will be referred to dermatology for evaluation of possible psoriasis, needing prescription shampoo. We will continue with same treatment plan to his left buttock and he will follow-up in one week 04/28/18 on evaluation today patient actually appears to be completely healed at  this point. He has still been waiting on figuring out what to do as far as a referral to dermatology made for the abnormal lesion in the scalp region on the left. Nonetheless he was contacted by you and see but they were trying to schedule an appointment for him in [facility] he was not able to drive there for an appointment. Therefore snow appointment has been made up to this point. We are gonna work on that during the office today. Electronic Signature(s) Signed: 04/29/2018 1:32:06 AM By: Martin Keeler PA-C Entered By: [name] [name] on 04/28/2018 23:39:51 Mittelstaedt, [name] [name] ([id number]) -------------------------------------------------------------------------------- Physical Exam Details Patient Name: [name] [name] [name] C. Date of Service: 04/28/2018 9:00 AM Medical Record Number: [id number] Patient Account Number: [id number] Date of Birth/Sex: Aug 05, 1941 (77 y.o. M) Treating RN: [name] [name] Primary Care Provider: Park [name] Other Clinician: Referring Provider: Park [name] Treating Provider/Extender: [name] [name] Weeks in Treatment: 4 Constitutional Well-nourished and well-hydrated in no acute distress. Respiratory normal breathing without difficulty. clear to auscultation bilaterally. Cardiovascular regular rate and rhythm with normal S1, S2. Psychiatric this patient is able to make decisions and demonstrates good insight into disease process. Alert and Oriented x 3. pleasant and cooperative. Notes Patient's wounds which we have been treating him is completely healed at this point today which is excellent news. In regard to the lesion the left scalp region I do think he needs to see dermatology for potential  biopsy. We did actually contact the dermatology office while he was here today and actually scheduled the appointment for him this is scheduled for May 30, 2018 and the patient son was given the information for the appointment he is the individual who will aid in  getting Mr. Justiniano to his appointment. Electronic Signature(s) Signed: 04/29/2018 1:32:06 AM By: Martin Keeler PA-C Entered By: [name] [name] on 04/28/2018 23:40:36 [name] [name] [name] [name] ([id number]) -------------------------------------------------------------------------------- Physician Orders Details Patient Name: [name] [name]. Date of Service: 04/28/2018 9:00 AM Medical Record Number: [id number] Patient Account Number: [id number] Date of Birth/Sex: 1940/12/22 (77 y.o. 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 Follow up with [facility] Dermat[facility] as ordered. Please call our office if you have any questions or concerns. Electronic Signature(s) Signed: 04/29/2018 1:32:06 AM By: Martin Keeler PA-C Signed: 05/01/2018 2:22:32 PM By: [name] [name] Entered By: [name] [name] on 04/28/2018 10:15:41 [name] [name] [name] [name] ([id number]) -------------------------------------------------------------------------------- Problem List Details Patient Name: [name] [name] [name] C. Date of Service: 04/28/2018 9:00 AM Medical Record Number: [id number] Patient Account Number: [id number] Date of Birth/Sex: 1940-11-26 (77 y.o. 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 Date of Service: 04/28/2018 9:00 AM Medical Record Number: [id number] Patient Account Number: [id number] Date of Birth/Sex: 02-18-1941 (77 y.o. M) Treating RN: [name] [name] Primary Care Provider: Park [name] Other Clinician: Referring Provider: Park [name] Treating Provider/Extender: [name] [name] Weeks in Treatment: 4 Subjective Chief Complaint Information obtained from Patient Left gluteal ulcer History of Present Illness (HPI) 07/08/17 on evaluation today patient presents initially for issues that he has been having with bilateral lower extremities this has been going on for the best I can tell "about a month" he has been seen by his primary care provider at [facility] family practice and they have been performing wraps. They also did get him set up with home health although there was confusion about what company was providing the home health for him. Unfortunately patient with a poor historian and it was difficult to gather information about how this has really progressed over the past month. Nonetheless he does  have erythema noted of the right lower extremity and ulceration at this location as well. He has what appears to be lymphedema noted of the bilateral lower extremities and  the left forefoot region is macerated potentially with a fungal infection noted as well. No fevers, chills, nausea, or vomiting noted at this time. As best I can gather patient has a history of hypothyroidism, hypertension, medicinal and antinausea medication he tells me due to the fact that he has been on the antibiotic. 07/22/2017 -- I'm reviewing this 78 year old gentleman for the first time today, and notes that he was recently in hospital but he got himself discharge AMA, and during this time he was treated for acute hyponatremia, cellulitis and bilateral leg ulceration with a history of alcohol abuse. The patient's hemoglobin A1c was 5.1 the last time he was in hospital and he was also noted to have cellulitis of the lower extremity and since he could not tolerate Augmentin was started on Bactrim. the patient and his son both have limited understanding and knowledge and it seems quite difficult to make them understand the treatment plan 08/05/17 on evaluation today patient's wound appears to be healed and he is doing very well. Unfortunately we have been unable to obtain the compression stockings for him at this point. We will discuss it further and plan. Otherwise he has no other concerns. Readmission: 03/31/18 on evaluation today patient is seen back in our clinic for a different issue than what we previously have evaluated him for. 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 He states this only seems to hurt when he sits and unfortunately he tells me that he sits the majority of his day in his chair. I did ask him about whether or not he had accounts that he can [place] he states he does but he never lays on it. He does have multiple myeloma and is  currently under treatment for this. With that being said he also tells [place] that he just eats peanut butter sandwiches and boiled eggs each day and drinks beer. When directly asked if each anything else he says no. Nonetheless it does appear that the wound itself is fairly superficial and I think could actually heal very nicely with appropriate treatment and therapy. 04/07/18 on evaluation today patient actually appears to be doing about the same in regard to the sacral ulcer. In regard to his ulcer on the left forearm which is new and secondary to fall this appears to be skin tear. He does have some issues with balance specifically related to a recent medication that he was placed on. The good news is this was discontinued a week ago nonetheless he did have a fall which was about a week ago causing the skin tear. There is some hematoma underneath the flap which is actually necrotic and does not appear to be reattaching. 04/10/18 on evaluation today patient's wounds actually appear to be showing some signs of improvement. Unfortunately it [name] [name] [name] C. ([id number]) sounds like he is not eating appropriately at this point according to his son who was present during evaluation today as well. I did inquire the patient as to why this is he states "I'm just not hungry". He has been tolerating the dressing changes without complication he does not seem to have any evidence of infection at this point. Overall I'm fairly pleased with the progress that has been made although again I think it's lower than it could be if you would stay off of his bottom and eat more appropriately I suspect he is probably malnourished. 04/20/18-He is seen in evaluation for left  buttock ulcer. The left forearm is healed. He does bring to my attention an area of dry flaking skin to his left scalp; he will be referred to dermatology for evaluation of possible psoriasis, needing prescription shampoo. We will continue with same  treatment plan to his left buttock and he will follow-up in one week 04/28/18 on evaluation today patient actually appears to be completely healed at this point. He has still been waiting on figuring out what to do as far as a referral to dermatology made for the abnormal lesion in the scalp region on the left. Nonetheless he was contacted by you and see but they were trying to schedule an appointment for him in [facility] he was not able to drive there for an appointment. Therefore snow appointment has been made up to this point. We are gonna work on that during the office today. Patient History Unable to Obtain Patient History due to Altered Mental Status. Information obtained from Patient. Social History Former smoker, Marital Status - Married, Alcohol Use - Daily, Drug Use - No History, Caffeine Use - Rarely. Medical And Surgical History Notes Oncologic multiple myeloma Review of Systems (ROS) Constitutional Symptoms (General Health) Denies complaints or symptoms of Fever, Chills. Respiratory The patient has no complaints or symptoms. Cardiovascular The patient has no complaints or symptoms. Psychiatric The patient has no complaints or symptoms. Objective Constitutional Well-nourished and well-hydrated in no acute distress. Vitals Time Taken: 9:32 AM, Height: 67 in, Weight: 158 lbs, BMI: 24.7, Temperature: 98.3 F, Pulse: 75 bpm, Respiratory Rate: 18 breaths/min, Blood Pressure: 144/47 mmHg. Respiratory normal breathing without difficulty. clear to auscultation bilaterally. Cardiovascular [name] [name] [name] C. ([id number]) regular rate and rhythm with normal S1, S2. Psychiatric this patient is able to make decisions and demonstrates good insight into disease process. Alert and Oriented x 3. pleasant and cooperative. General Notes: Patient's wounds which we have been treating him is completely healed at this point today which is excellent news. In regard to the lesion the left  scalp region I do think he needs to see dermatology for potential biopsy. We did actually contact the dermatology office while he was here today and actually scheduled the appointment for him this is scheduled for May 30, 2018 and the patient son was given the information for the appointment he is the individual who will aid in getting Mr. Stepanek to his appointment. Integumentary (Hair, Skin) Wound #3 status is Healed - Epithelialized. Original cause of wound was Pressure Injury. The wound is located on the Left Gluteus. The wound measures 0cm length x 0cm width x 0cm depth; 0cm^2 area and 0cm^3 volume. There is no tunneling or undermining noted. There is a none present amount of drainage noted. The wound margin is flat and intact. There is no granulation within the wound bed. There is no necrotic tissue within the wound bed. The periwound skin appearance did not exhibit: Callus, Crepitus, Excoriation, Induration, Rash, Scarring, Dry/Scaly, Maceration, Atrophie Blanche, Cyanosis, Ecchymosis, Hemosiderin Staining, Mottled, Pallor, Rubor, Erythema. Periwound temperature was noted as No Abnormality. The periwound has tenderness on palpation. Assessment Active Problems ICD-10 Lymphedema, not elsewhere classified Peripheral vascular disease, unspecified Pressure ulcer of left buttock, stage 3 Multiple myeloma not having achieved remission Plan Skin Barriers/Peri-Wound Care: Barrier cream - Desitin to bottom daily and after cleaning Discharge From [facility] Services: Discharge from [place] discharged from [place]. Follow up with [facility] Dermatology as ordered. Please call our office if you have any questions or concerns.  At this point we are discontinue wound care services as far as the sacral wound is concerned. With that being said I am going to go ahead and recommend the follow-up with dermatology and again that appointment was made while the patient was in the office today and  they were given information for the date and time May 30, 2018. We will see him back in the future as needed if anything changes or worsens. In the meantime he will utilize desiccant or a similar barrier cream to protect the sacral region. ROD, MAJERUS ([id number]) Electronic Signature(s) Signed: 04/29/2018 1:32:06 AM By: Martin Keeler PA-C Entered By: [name] [name] on 04/28/2018 23:41:21 [name] [name] [name] [name] ([id number]) -------------------------------------------------------------------------------- ROS/PFSH Details Patient Name: [name] [name]. Date of Service: 04/28/2018 9:00 AM Medical Record Number: [id number] Patient Account Number: [id number] Date of Birth/Sex: 03-04-1941 (77 y.o. M) Treating RN: [name] [name] Primary Care Provider: Park [name] Other Clinician: Referring Provider: Park [name] Treating Provider/Extender: [name] [name] Weeks in Treatment: 4 Unable to Obtain Patient History due to oo Altered Mental Status Information Obtained From Patient Wound History Do you currently have one or more open woundso Yes How many open wounds do you currently haveo 1 Approximately how long have you had your woundso 2 weeks How have you been treating your wound(s) until nowo vaseline Has your wound(s) ever healed and then re-openedo No Have you had any lab work done in the past montho No Have you tested positive for an antibiotic resistant organism (MRSA, VRE)o No Have you tested positive for osteomyelitis (bone infection)o No Have you had any tests for circulation on your legso No Have you had other problems associated with your woundso Infection Constitutional Symptoms (General Health) Complaints and Symptoms: Negative for: Fever; Chills Respiratory Complaints and Symptoms: No Complaints or Symptoms Cardiovascular Complaints and Symptoms: No Complaints or Symptoms Endocrine Medical History: Positive for: Type II Diabetes Treated with: Oral agents Blood  sugar tested every day: No Oncologic Medical History: Past Medical History Notes: multiple myeloma Psychiatric Brunkow, Tiyon C. ([id number]) Complaints and Symptoms: No Complaints or Symptoms Immunizations Pneumococcal Vaccine: Received Pneumococcal Vaccination: No Implantable Devices Family and Social History Former smoker; Marital Status - Married; Alcohol Use: Daily; Drug Use: No History; Caffeine Use: Rarely; Advanced Directives: No; Patient does not want information on Advanced Directives; Do not resuscitate: No; Living Will: No; Medical Power of Attorney: No Physician Affirmation I have reviewed and agree with the above information. Electronic Signature(s) Signed: 04/29/2018 1:32:06 AM By: Martin Keeler PA-C Signed: 05/01/2018 2:22:32 PM By: [name] [name] Entered By: [name] [name] on 04/28/2018 23:40:09 Moone, [name] [name] ([id number]) -------------------------------------------------------------------------------- SuperBill Details Patient Name: [name] [name]. Date of Service: 04/28/2018 Medical Record Number: [id number] Patient Account Number: [id number] Date of Birth/Sex: 1941/03/26 (77 y.o. 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, stage 3 C90.00 Multiple myeloma not having achieved remission Facility Procedures CPT4 Code: [id number] Description: 37169 - WOUND CARE VISIT-LEV 2 EST PT Modifier: Quantity: 1 Physician Procedures CPT4 Code: [id number] Description: 01751 - WC PHYS LEVEL 3 - EST PT ICD-10 Diagnosis Description I89.0 Lymphedema, not elsewhere classified I73.9 Peripheral vascular disease, unspecified L89.323 Pressure ulcer of left buttock,  stage 3 C90.00 Multiple myeloma not having  achieved remission Modifier: Quantity: 1 Electronic  Signature(s) Signed: 04/29/2018 1:32:06 AM By: Martin Keeler PA-C Entered By: [name] [name] on 04/28/2018 23:41:40

## 2018-05-05 ENCOUNTER — Ambulatory Visit: Payer: Medicare Other | Admitting: Family Medicine

## 2018-05-05 DIAGNOSIS — E1165 Type 2 diabetes mellitus with hyperglycemia: Secondary | ICD-10-CM | POA: Diagnosis not present

## 2018-05-05 DIAGNOSIS — E1140 Type 2 diabetes mellitus with diabetic neuropathy, unspecified: Secondary | ICD-10-CM | POA: Diagnosis not present

## 2018-05-05 DIAGNOSIS — I129 Hypertensive chronic kidney disease with stage 1 through stage 4 chronic kidney disease, or unspecified chronic kidney disease: Secondary | ICD-10-CM | POA: Diagnosis not present

## 2018-05-05 DIAGNOSIS — C9000 Multiple myeloma not having achieved remission: Secondary | ICD-10-CM | POA: Diagnosis not present

## 2018-05-05 DIAGNOSIS — E039 Hypothyroidism, unspecified: Secondary | ICD-10-CM | POA: Diagnosis not present

## 2018-05-05 DIAGNOSIS — E538 Deficiency of other specified B group vitamins: Secondary | ICD-10-CM | POA: Diagnosis not present

## 2018-05-05 DIAGNOSIS — E1122 Type 2 diabetes mellitus with diabetic chronic kidney disease: Secondary | ICD-10-CM | POA: Diagnosis not present

## 2018-05-05 DIAGNOSIS — N181 Chronic kidney disease, stage 1: Secondary | ICD-10-CM | POA: Diagnosis not present

## 2018-05-09 ENCOUNTER — Telehealth: Payer: Self-pay | Admitting: *Deleted

## 2018-05-09 ENCOUNTER — Inpatient Hospital Stay: Payer: Medicare Other | Attending: Oncology

## 2018-05-09 ENCOUNTER — Other Ambulatory Visit: Payer: Self-pay

## 2018-05-09 ENCOUNTER — Other Ambulatory Visit: Payer: Self-pay | Admitting: Oncology

## 2018-05-09 ENCOUNTER — Other Ambulatory Visit: Payer: Self-pay | Admitting: *Deleted

## 2018-05-09 DIAGNOSIS — E0849 Diabetes mellitus due to underlying condition with other diabetic neurological complication: Secondary | ICD-10-CM

## 2018-05-09 DIAGNOSIS — R7989 Other specified abnormal findings of blood chemistry: Secondary | ICD-10-CM | POA: Diagnosis not present

## 2018-05-09 DIAGNOSIS — C9000 Multiple myeloma not having achieved remission: Secondary | ICD-10-CM

## 2018-05-09 DIAGNOSIS — E1142 Type 2 diabetes mellitus with diabetic polyneuropathy: Secondary | ICD-10-CM | POA: Diagnosis not present

## 2018-05-09 DIAGNOSIS — D649 Anemia, unspecified: Secondary | ICD-10-CM | POA: Insufficient documentation

## 2018-05-09 DIAGNOSIS — E039 Hypothyroidism, unspecified: Secondary | ICD-10-CM

## 2018-05-09 DIAGNOSIS — E1165 Type 2 diabetes mellitus with hyperglycemia: Secondary | ICD-10-CM

## 2018-05-09 DIAGNOSIS — B351 Tinea unguium: Secondary | ICD-10-CM | POA: Diagnosis not present

## 2018-05-09 LAB — COMPREHENSIVE METABOLIC PANEL
ALT: 43 U/L (ref 0–44)
ANION GAP: 11 (ref 5–15)
AST: 46 U/L — AB (ref 15–41)
Albumin: 3.6 g/dL (ref 3.5–5.0)
Alkaline Phosphatase: 142 U/L — ABNORMAL HIGH (ref 38–126)
BUN: 19 mg/dL (ref 8–23)
CO2: 25 mmol/L (ref 22–32)
Calcium: 10.2 mg/dL (ref 8.9–10.3)
Chloride: 91 mmol/L — ABNORMAL LOW (ref 98–111)
Creatinine, Ser: 0.59 mg/dL — ABNORMAL LOW (ref 0.61–1.24)
GFR calc Af Amer: >60 mL/min (ref >60)
GFR calc non Af Amer: >60 mL/min (ref >60)
GLUCOSE: 143 mg/dL — AB (ref 70–99)
POTASSIUM: 2.8 mmol/L — AB (ref 3.5–5.1)
SODIUM: 127 mmol/L — AB (ref 135–145)
Total Bilirubin: 2.3 mg/dL — ABNORMAL HIGH (ref 0.3–1.2)
Total Protein: 6 g/dL — ABNORMAL LOW (ref 6.5–8.1)

## 2018-05-09 LAB — CBC WITH DIFFERENTIAL/PLATELET
Basophils Absolute: 0 10*3/uL (ref 0–0.1)
Basophils Relative: 1 %
Eosinophils Absolute: 0.1 10*3/uL (ref 0–0.7)
Eosinophils Relative: 2 %
HEMATOCRIT: 26.3 % — AB (ref 40.0–52.0)
Hemoglobin: 9.6 g/dL — ABNORMAL LOW (ref 13.0–18.0)
LYMPHS ABS: 0.4 10*3/uL — AB (ref 1.0–3.6)
LYMPHS PCT: 7 %
MCH: 39.3 pg — AB (ref 26.0–34.0)
MCHC: 36.7 g/dL — AB (ref 32.0–36.0)
MCV: 106.9 fL — AB (ref 80.0–100.0)
MONOS PCT: 12 %
Monocytes Absolute: 0.7 10*3/uL (ref 0.2–1.0)
NEUTROS ABS: 4.5 10*3/uL (ref 1.4–6.5)
Neutrophils Relative %: 78 %
Platelets: 164 10*3/uL (ref 150–440)
RBC: 2.46 MIL/uL — ABNORMAL LOW (ref 4.40–5.90)
RDW: 13.2 % (ref 11.5–14.5)
WBC: 5.7 10*3/uL (ref 3.8–10.6)

## 2018-05-09 LAB — SAMPLE TO BLOOD BANK

## 2018-05-09 MED ORDER — POTASSIUM CHLORIDE CRYS ER 20 MEQ PO TBCR: 20.0000 meq | EXTENDED_RELEASE_TABLET | Freq: Two times a day (BID) | ORAL | 0 refills | Status: DC

## 2018-05-09 NOTE — Progress Notes (Signed)
Critical potassium 2.8.   RX 20 mEq BID sent to [place] for 10 days.   Has scheduled appt in 1 week with Dr. Grayland Ormond next week and will re-check labs.   Faythe Casa, NP 05/09/2018 3:13 PM

## 2018-05-09 NOTE — Telephone Encounter (Signed)
Called patient to inform him of low potassium level today. Left voice mail message for patient to pick up potassium prescription from [facility] on [address] and start taking it today.       dhs

## 2018-05-10 DIAGNOSIS — N181 Chronic kidney disease, stage 1: Secondary | ICD-10-CM | POA: Diagnosis not present

## 2018-05-10 DIAGNOSIS — E538 Deficiency of other specified B group vitamins: Secondary | ICD-10-CM | POA: Diagnosis not present

## 2018-05-10 DIAGNOSIS — E1122 Type 2 diabetes mellitus with diabetic chronic kidney disease: Secondary | ICD-10-CM | POA: Diagnosis not present

## 2018-05-10 DIAGNOSIS — C9000 Multiple myeloma not having achieved remission: Secondary | ICD-10-CM | POA: Diagnosis not present

## 2018-05-10 DIAGNOSIS — E1165 Type 2 diabetes mellitus with hyperglycemia: Secondary | ICD-10-CM | POA: Diagnosis not present

## 2018-05-10 DIAGNOSIS — I129 Hypertensive chronic kidney disease with stage 1 through stage 4 chronic kidney disease, or unspecified chronic kidney disease: Secondary | ICD-10-CM | POA: Diagnosis not present

## 2018-05-10 DIAGNOSIS — E1140 Type 2 diabetes mellitus with diabetic neuropathy, unspecified: Secondary | ICD-10-CM | POA: Diagnosis not present

## 2018-05-10 DIAGNOSIS — E039 Hypothyroidism, unspecified: Secondary | ICD-10-CM | POA: Diagnosis not present

## 2018-05-10 LAB — KAPPA/LAMBDA LIGHT CHAINS
KAPPA, LAMDA LIGHT CHAIN RATIO: 116 — AB (ref 0.26–1.65)
Kappa free light chain: 986 mg/L — ABNORMAL HIGH (ref 3.3–19.4)
Lambda free light chains: 8.5 mg/L (ref 5.7–26.3)

## 2018-05-10 LAB — PROTEIN ELECTROPHORESIS, SERUM
A/G Ratio: 1.5 (ref 0.7–1.7)
ALPHA-1-GLOBULIN: 0.3 g/dL (ref 0.0–0.4)
ALPHA-2-GLOBULIN: 0.5 g/dL (ref 0.4–1.0)
Albumin ELP: 3.2 g/dL (ref 2.9–4.4)
Beta Globulin: 0.8 g/dL (ref 0.7–1.3)
GAMMA GLOBULIN: 0.5 g/dL (ref 0.4–1.8)
GLOBULIN, TOTAL: 2.1 g/dL — AB (ref 2.2–3.9)
Total Protein ELP: 5.3 g/dL — ABNORMAL LOW (ref 6.0–8.5)

## 2018-05-10 LAB — IGG, IGA, IGM
IGA: 10 mg/dL — AB (ref 61–437)
IGG (IMMUNOGLOBIN G), SERUM: 443 mg/dL — AB (ref 700–1600)
IgM (Immunoglobulin M), Srm: 10 mg/dL — ABNORMAL LOW (ref 15–143)

## 2018-05-10 LAB — THYROID PANEL WITH TSH
Free Thyroxine Index: 3 (ref 1.2–4.9)
T3 Uptake Ratio: 28 % (ref 24–39)
T4, Total: 10.8 ug/dL (ref 4.5–12.0)
TSH: 1.69 u[IU]/mL (ref 0.450–4.500)

## 2018-05-11 ENCOUNTER — Telehealth: Payer: Self-pay

## 2018-05-11 LAB — HEMOGLOBIN A1C
HEMOGLOBIN A1C: 4.6 % — AB (ref 4.8–5.6)
MEAN PLASMA GLUCOSE: 85 mg/dL

## 2018-05-11 NOTE — Telephone Encounter (Signed)
Nutrition  Called patient for nutrition follow-up this am and left message for him to return RD call on mobile phone.    Chart reviewed.  Noted wound to buttock has healed per 7/19 MD note. Lesion on scalp and being sent to [facility] dermatology  Noted weight on 6/26 156 lb 3.2 oz decreased from weight of 183 lb on 5/6.    14% weight loss in the last 1 1/2 months  Labs reviewed: Noted K 2.8 (supplementation recommended per NP)   Medications noted megace script given at last visit on 6/26  Intervention: Left message for patient to return RD call Will leave additional case of ensure enlive for patient to pick up on 8/6 MD visit. RD not in clinic on this day.  Recommend per pharmacy different dosage of megace for appetite stimulation.  Message sent to provider Recommend patient continue to take MVI, thiamine  [name], Manuel Garcia, [place] Registered Dietitian [phone] (pager)

## 2018-05-12 DIAGNOSIS — I129 Hypertensive chronic kidney disease with stage 1 through stage 4 chronic kidney disease, or unspecified chronic kidney disease: Secondary | ICD-10-CM | POA: Diagnosis not present

## 2018-05-12 DIAGNOSIS — N181 Chronic kidney disease, stage 1: Secondary | ICD-10-CM | POA: Diagnosis not present

## 2018-05-12 DIAGNOSIS — E1122 Type 2 diabetes mellitus with diabetic chronic kidney disease: Secondary | ICD-10-CM | POA: Diagnosis not present

## 2018-05-12 DIAGNOSIS — E1165 Type 2 diabetes mellitus with hyperglycemia: Secondary | ICD-10-CM | POA: Diagnosis not present

## 2018-05-12 DIAGNOSIS — E039 Hypothyroidism, unspecified: Secondary | ICD-10-CM | POA: Diagnosis not present

## 2018-05-12 DIAGNOSIS — C9000 Multiple myeloma not having achieved remission: Secondary | ICD-10-CM | POA: Diagnosis not present

## 2018-05-12 DIAGNOSIS — E1140 Type 2 diabetes mellitus with diabetic neuropathy, unspecified: Secondary | ICD-10-CM | POA: Diagnosis not present

## 2018-05-12 DIAGNOSIS — E538 Deficiency of other specified B group vitamins: Secondary | ICD-10-CM | POA: Diagnosis not present

## 2018-05-15 NOTE — Progress Notes (Signed)
[place]  [phone] [phone]  ID: [name] OB: 08-19-1941  MR#: [id number]  VQQ#:[id number]  Patient Care Team: Valerie Roys, DO as PCP - General (Family Medicine)  CHIEF COMPLAINT: Multiple myeloma.  INTERVAL HISTORY: Patient returns to clinic today for repeat laboratory work and further evaluation.  Performance status is declining.  He continues to complain of chronic pain.  He has worsening weakness and fatigue.  He has a poor appetite and continues to lose weight.  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  He denies any fevers.  He denies any easy bleeding or bruising. He denies any chest pain or shortness of breath. He has no nausea, vomiting, constipation, or diarrhea. He has no urinary complaints.  Patient continues to feel terrible, but offers no further specific complaints.  REVIEW OF SYSTEMS:   Review of Systems  Constitutional: Positive for malaise/fatigue and weight loss. Negative for fever.  Respiratory: Negative.  Negative for cough and shortness of breath.   Cardiovascular: Negative.  Negative for chest pain and leg swelling.  Gastrointestinal: Negative.  Negative for abdominal pain, blood in stool and melena.  Genitourinary: Negative.   Musculoskeletal: Positive for falls.  Skin: Negative.  Negative for rash.  Neurological: Positive for dizziness, tingling, sensory change and weakness. Negative for focal weakness.  Endo/Heme/Allergies: Negative.  Does not bruise/bleed easily.  Psychiatric/Behavioral: Positive for substance abuse. The patient is not nervous/anxious.     As per HPI. Otherwise, a complete review of systems is negative.  PAST MEDICAL HISTORY: Past Medical History:  Diagnosis Date  . Arthritis   . Cancer ([place])    multiple myaloma  . Diabetes mellitus   . Dyspnea   . 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 Hypertension   . Hypothyroidism   . Neuropathy     . Peripheral vascular disease ([place])   . Venous stasis     PAST SURGICAL HISTORY: Past Surgical History:  Procedure Laterality Date  . CATARACT EXTRACTION W/PHACO Right 10/25/2016   Procedure: CATARACT EXTRACTION PHACO AND INTRAOCULAR LENS PLACEMENT (IOC)  right eye complicated;  Surgeon: Ronnell Freshwater, MD;  Location: [facility];  Service: Ophthalmology;  Laterality: Right;  Right eye Diabetic - oral meds Vision blue  . FINGER SURGERY      FAMILY HISTORY Family History  Problem Relation Age of Onset  . Heart disease Mother   . Alzheimer's disease Sister   . Heart disease Maternal Aunt        ADVANCED DIRECTIVES:    HEALTH MAINTENANCE: Social History   Tobacco Use  . Smoking status: Former Smoker    Packs/day: 0.25    Types: Cigarettes    Last attempt to quit: 03/23/2015    Years since quitting: 3.1  . Smokeless tobacco: Current User    Types: Chew  Substance Use Topics  . Alcohol use: Yes    Alcohol/week: 42.0 standard drinks    Types: 42 Cans of beer per week    Comment: 5-6 every day  . Drug use: No     Colonoscopy:  PAP:  Bone density:  Lipid panel:  Allergies  Allergen Reactions  . Metformin And Related Nausea And Vomiting    No current facility-administered medications for this visit.    No current outpatient medications on file.   Facility-Administered Medications Ordered in Other Visits  Medication Dose Route Frequency Provider Last Rate Last Dose  . 0.9 %  sodium chloride infusion   Intravenous Continuous Harrie Foreman, MD 100 mL/hr at 05/19/18 1103    . acetaminophen (TYLENOL) tablet 650 mg  650 mg Oral Q6H PRN Harrie Foreman, MD       Or  . acetaminophen (TYLENOL) suppository 650 mg  650 mg Rectal Q6H PRN Harrie Foreman, MD      . docusate sodium (COLACE) capsule 100 mg  100 mg Oral BID Harrie Foreman, MD      . enoxaparin (LOVENOX) injection 40 mg  40 mg Subcutaneous Q24H Harrie Foreman, MD      .  Gerhardt's butt cream   Topical QID Bettey Costa, MD      . hydrALAZINE (APRESOLINE) injection 10 mg  10 mg Intravenous Q6H PRN Mody, Sital, MD      . levofloxacin (LEVAQUIN) IVPB 750 mg  750 mg Intravenous Q24H Bettey Costa, MD   Stopped at 05/19/18 1308  . levothyroxine (SYNTHROID, LEVOTHROID) tablet 75 mcg  75 mcg Oral QAC breakfast Harrie Foreman, MD      . ondansetron [facility]) tablet 4 mg  4 mg Oral Q6H PRN Harrie Foreman, MD       Or  . ondansetron [facility]) injection 4 mg  4 mg Intravenous Q6H PRN Harrie Foreman, MD      . potassium chloride SA (K-DUR,KLOR-CON) CR tablet 20 mEq  20 mEq Oral BID Harrie Foreman, MD        OBJECTIVE: Vitals:   05/16/18 1107  BP: 128/64  Pulse: 98  Resp: 18  Temp: 97.6 F (36.4 C)     Body mass index is 21.67 kg/m.    ECOG FS:0 - Asymptomatic  General: Disheveled appearing, no acute distress. Eyes: Pink conjunctiva, anicteric sclera. HEENT: Normocephalic, moist mucous membranes. Lungs: Clear to auscultation bilaterally. Heart: Regular rate and rhythm. No rubs, murmurs, or gallops. Abdomen: Soft, nontender, nondistended. No organomegaly noted, normoactive bowel sounds. Musculoskeletal: No edema, cyanosis, or clubbing. Neuro: Alert, answering all questions appropriately. Cranial nerves grossly intact. Skin: No rashes or petechiae noted. Psych: Normal affect.  LAB RESULTS:  Lab Results  Component Value Date   NA 135 05/18/2018   K 4.0 05/18/2018   CL 101 05/18/2018   CO2 27 05/18/2018   GLUCOSE 136 (H) 05/18/2018   BUN 22 05/18/2018   CREATININE 0.59 (L) 05/18/2018   CALCIUM 9.8 05/18/2018   PROT 5.1 (L) 05/18/2018   ALBUMIN 3.0 (L) 05/18/2018   AST 46 (H) 05/18/2018   ALT 46 (H) 05/18/2018   ALKPHOS 138 (H) 05/18/2018   BILITOT 1.1 05/18/2018   GFRNONAA >60 05/18/2018   GFRAA >60 05/18/2018    Lab Results  Component Value Date   WBC 6.5 05/18/2018   NEUTROABS 5.4 05/18/2018   HGB 8.5 (L) 05/18/2018   HCT 23.2  (L) 05/18/2018   MCV 105.8 (H) 05/18/2018   PLT 168 05/18/2018     STUDIES: Dg Eye Foreign Body  Result Date: 05/19/2018 CLINICAL DATA:  Metal working/exposure; clearance prior to MRI EXAM: ORBITS FOR FOREIGN BODY - 2 VIEW COMPARISON:  None. FINDINGS: Water's views with eyes deviated toward the left and toward the right obtained. No intraorbital radiopaque foreign body. Aerated paranasal sinuses are clear. No fracture or dislocation. IMPRESSION: No evidence of metallic foreign body within the orbits. Electronically Signed   By: Lowella Grip III M.D.   On: 05/19/2018 12:19   Dg Abd 1 View  Result Date: 05/19/2018 CLINICAL DATA:  Abdominal  pain EXAM: ABDOMEN - 1 VIEW COMPARISON:  None. FINDINGS: Scattered large and small bowel gas is noted. No abnormal mass or abnormal calcifications are seen. No radiopaque foreign body is noted. No bony abnormality seen. IMPRESSION: No acute abnormality noted. Electronically Signed   By: Inez Catalina M.D.   On: 05/19/2018 13:22   Dg Chest Portable 1 View  Result Date: 05/18/2018 CLINICAL DATA:  Acute onset of generalized weakness. Altered mental status. EXAM: PORTABLE CHEST 1 VIEW COMPARISON:  Chest radiograph performed 10/21/2017 FINDINGS: The lungs are well-aerated. Minimal left midlung opacity could reflect atelectasis or mild pneumonia. There is no evidence of pleural effusion or pneumothorax. A nonspecific 9 mm density is noted at the right midlung zone. The cardiomediastinal silhouette is within normal limits. No acute osseous abnormalities are seen. Chronic right-sided rib deformities are noted. IMPRESSION: 1. Minimal left midlung opacity could reflect atelectasis or mild pneumonia. 2. Nonspecific 9 mm density at the right midlung zone. Follow-up chest radiograph with nipple markers would be helpful for further evaluation, when and as deemed clinically appropriate. Electronically Signed   By: Garald Balding M.D.   On: 05/18/2018 23:03    ASSESSMENT:  Multiple myeloma.  PLAN:  1.  Multiple myeloma: Repeat bone marrow biopsy on March 28, 2018 revealed 23 to 24% plasma cells with kappa restriction, which is only slightly decreased from previous when it was reported patient to have 29% plasma cells.  He has normal cytogenetics.  His kappa free light chains continue to be significantly elevated, but essentially unchanged.  He does not have an M spike on SPEP.  His kappa/lambda free light chain ratio is 116.0 which is significantly improved over 3 months ago.  His IgG, IgA, and IgM are all reduced.  Calcium and creatinine continued to be within normal limits.  His white blood cell count and platelet count are now within normal limits.  He continues to have anemia that is relatively unchanged. Patient never had a metastatic bone survey that was scheduled previously.  Patient could not tolerate Cytoxan given his persistent pancytopenia.  He completed 6 cycles of Velcade on Feb 23, 2018.  Unclear if this can be used again given his persistent peripheral neuropathy.  Hesitant to use Revlimid or other oral treatments secondary to compliance concerns.  His performance status is declining, therefore will continue to hold treatment at this time.  Return to clinic in 4 weeks with repeat laboratory work and further evaluation.   2.  Leukopenia: Resolved. 3.  Anemia: Patient's hemoglobin remains decreased, but unchanged.  Monitor. 4.  Thrombocytopenia: Resolved. 5.  Elevated MCV: Chronic and unchanged.  Patient's B12 and folate are within normal limits. 6. Peripheral neuropathy: Present prior to beginning treatment and unlikely related to underlying myeloma or his chemotherapy.  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  Continue gabapentin 600 mg 3 times a day.  Patient is not taking folic acid, thiamine, and a multivitamin as directed. 7.  Poor appetite: Given patient's dizziness and falls will discontinue Remeron.  Unclear of patient's compliance with  the Megace prescription given previously.  Patient declined evaluation by dietary.   8.  Weakness and fatigue/declining performance status: Likely multifactorial.  Hold treatment as above.  Megace as above. 9.  Hyponatremia: Chronic and unchanged. 10.  Hypokalemia: Patient's potassium level is 2.8.  He declined IV treatment.  Patient was given a prescription for oral potassium supplementation. 11.  Elevated alkaline phosphatase and bilirubin: Patient was scheduled for abdominal ultrasound for further  evaluation, but declined further testing.  Patient expressed understanding and was in agreement with this plan. He also understands that He can call clinic at any time with any questions, concerns, or complaints.    Lloyd Huger, MD   05/19/2018 1:56 PM

## 2018-05-16 ENCOUNTER — Inpatient Hospital Stay: Payer: Medicare Other | Attending: Oncology | Admitting: Oncology

## 2018-05-16 ENCOUNTER — Encounter: Payer: Self-pay | Admitting: Oncology

## 2018-05-16 VITALS — BP 128/64 | HR 98 | Temp 97.6°F | Resp 18 | Wt 151.0 lb

## 2018-05-16 DIAGNOSIS — D649 Anemia, unspecified: Secondary | ICD-10-CM | POA: Diagnosis not present

## 2018-05-16 DIAGNOSIS — R5383 Other fatigue: Secondary | ICD-10-CM | POA: Insufficient documentation

## 2018-05-16 DIAGNOSIS — R630 Anorexia: Secondary | ICD-10-CM | POA: Insufficient documentation

## 2018-05-16 DIAGNOSIS — E876 Hypokalemia: Secondary | ICD-10-CM | POA: Insufficient documentation

## 2018-05-16 DIAGNOSIS — R634 Abnormal weight loss: Secondary | ICD-10-CM | POA: Insufficient documentation

## 2018-05-16 DIAGNOSIS — R296 Repeated falls: Secondary | ICD-10-CM | POA: Insufficient documentation

## 2018-05-16 DIAGNOSIS — R531 Weakness: Secondary | ICD-10-CM | POA: Insufficient documentation

## 2018-05-16 DIAGNOSIS — I10 Essential (primary) hypertension: Secondary | ICD-10-CM | POA: Insufficient documentation

## 2018-05-16 DIAGNOSIS — R748 Abnormal levels of other serum enzymes: Secondary | ICD-10-CM | POA: Insufficient documentation

## 2018-05-16 DIAGNOSIS — R42 Dizziness and giddiness: Secondary | ICD-10-CM | POA: Diagnosis not present

## 2018-05-16 DIAGNOSIS — Z87891 Personal history of nicotine dependence: Secondary | ICD-10-CM | POA: Insufficient documentation

## 2018-05-16 DIAGNOSIS — E871 Hypo-osmolality and hyponatremia: Secondary | ICD-10-CM | POA: Insufficient documentation

## 2018-05-16 DIAGNOSIS — C9000 Multiple myeloma not having achieved remission: Secondary | ICD-10-CM | POA: Insufficient documentation

## 2018-05-16 DIAGNOSIS — E119 Type 2 diabetes mellitus without complications: Secondary | ICD-10-CM | POA: Diagnosis not present

## 2018-05-16 DIAGNOSIS — G629 Polyneuropathy, unspecified: Secondary | ICD-10-CM | POA: Insufficient documentation

## 2018-05-16 DIAGNOSIS — R718 Other abnormality of red blood cells: Secondary | ICD-10-CM | POA: Insufficient documentation

## 2018-05-16 DIAGNOSIS — R945 Abnormal results of liver function studies: Secondary | ICD-10-CM

## 2018-05-16 NOTE — Progress Notes (Signed)
Patient here today for follow up regarding myeloma. Patient and son report that over the last week patient has been unable to stand or walk at home. Patient reports he cannot walk due to difficulty with balance. Patient also reports neuropathy in hands has worsened, is not taking gabapentin at this time.

## 2018-05-17 ENCOUNTER — Ambulatory Visit: Payer: Medicare Other

## 2018-05-18 ENCOUNTER — Other Ambulatory Visit: Payer: Self-pay

## 2018-05-18 ENCOUNTER — Ambulatory Visit: Payer: Self-pay | Admitting: *Deleted

## 2018-05-18 ENCOUNTER — Emergency Department: Payer: Medicare Other

## 2018-05-18 ENCOUNTER — Inpatient Hospital Stay
Admission: EM | Admit: 2018-05-18 | Discharge: 2018-05-21 | DRG: 194 | Disposition: A | Discharge status: Skilled Nursing Facility | Payer: Medicare Other | Attending: Family Medicine | Admitting: Family Medicine

## 2018-05-18 ENCOUNTER — Ambulatory Visit: Payer: Medicare Other

## 2018-05-18 ENCOUNTER — Encounter: Payer: Self-pay | Admitting: Emergency Medicine

## 2018-05-18 DIAGNOSIS — E039 Hypothyroidism, unspecified: Secondary | ICD-10-CM | POA: Diagnosis present

## 2018-05-18 DIAGNOSIS — Z6823 Body mass index (BMI) 23.0-23.9, adult: Secondary | ICD-10-CM

## 2018-05-18 DIAGNOSIS — L89152 Pressure ulcer of sacral region, stage 2: Secondary | ICD-10-CM | POA: Diagnosis present

## 2018-05-18 DIAGNOSIS — T7601XA Adult neglect or abandonment, suspected, initial encounter: Secondary | ICD-10-CM | POA: Diagnosis not present

## 2018-05-18 DIAGNOSIS — R627 Adult failure to thrive: Secondary | ICD-10-CM | POA: Diagnosis not present

## 2018-05-18 DIAGNOSIS — Z87891 Personal history of nicotine dependence: Secondary | ICD-10-CM | POA: Diagnosis not present

## 2018-05-18 DIAGNOSIS — C9000 Multiple myeloma not having achieved remission: Secondary | ICD-10-CM | POA: Diagnosis not present

## 2018-05-18 DIAGNOSIS — F0390 Unspecified dementia without behavioral disturbance: Secondary | ICD-10-CM | POA: Diagnosis present

## 2018-05-18 DIAGNOSIS — R296 Repeated falls: Secondary | ICD-10-CM | POA: Diagnosis not present

## 2018-05-18 DIAGNOSIS — R630 Anorexia: Secondary | ICD-10-CM

## 2018-05-18 DIAGNOSIS — I2510 Atherosclerotic heart disease of native coronary artery without angina pectoris: Secondary | ICD-10-CM | POA: Diagnosis not present

## 2018-05-18 DIAGNOSIS — Z515 Encounter for palliative care: Secondary | ICD-10-CM

## 2018-05-18 DIAGNOSIS — R32 Unspecified urinary incontinence: Secondary | ICD-10-CM | POA: Diagnosis present

## 2018-05-18 DIAGNOSIS — R918 Other nonspecific abnormal finding of lung field: Secondary | ICD-10-CM | POA: Diagnosis not present

## 2018-05-18 DIAGNOSIS — E1151 Type 2 diabetes mellitus with diabetic peripheral angiopathy without gangrene: Secondary | ICD-10-CM | POA: Diagnosis present

## 2018-05-18 DIAGNOSIS — I10 Essential (primary) hypertension: Secondary | ICD-10-CM | POA: Diagnosis not present

## 2018-05-18 DIAGNOSIS — Z9181 History of falling: Secondary | ICD-10-CM | POA: Diagnosis not present

## 2018-05-18 DIAGNOSIS — Z820 Family history of epilepsy and other diseases of the nervous system: Secondary | ICD-10-CM | POA: Diagnosis not present

## 2018-05-18 DIAGNOSIS — L8990 Pressure ulcer of unspecified site, unspecified stage: Secondary | ICD-10-CM

## 2018-05-18 DIAGNOSIS — R159 Full incontinence of feces: Secondary | ICD-10-CM | POA: Diagnosis not present

## 2018-05-18 DIAGNOSIS — J189 Pneumonia, unspecified organism: Secondary | ICD-10-CM | POA: Diagnosis not present

## 2018-05-18 DIAGNOSIS — E1142 Type 2 diabetes mellitus with diabetic polyneuropathy: Secondary | ICD-10-CM | POA: Diagnosis not present

## 2018-05-18 DIAGNOSIS — Z1389 Encounter for screening for other disorder: Secondary | ICD-10-CM

## 2018-05-18 DIAGNOSIS — Z888 Allergy status to other drugs, medicaments and biological substances status: Secondary | ICD-10-CM | POA: Diagnosis not present

## 2018-05-18 DIAGNOSIS — R4182 Altered mental status, unspecified: Secondary | ICD-10-CM

## 2018-05-18 DIAGNOSIS — Z79890 Hormone replacement therapy (postmenopausal): Secondary | ICD-10-CM

## 2018-05-18 DIAGNOSIS — S2241XD Multiple fractures of ribs, right side, subsequent encounter for fracture with routine healing: Secondary | ICD-10-CM

## 2018-05-18 DIAGNOSIS — R531 Weakness: Secondary | ICD-10-CM

## 2018-05-18 LAB — COMPREHENSIVE METABOLIC PANEL
ALT: 46 U/L — ABNORMAL HIGH (ref 0–44)
AST: 46 U/L — AB (ref 15–41)
Albumin: 3 g/dL — ABNORMAL LOW (ref 3.5–5.0)
Alkaline Phosphatase: 138 U/L — ABNORMAL HIGH (ref 38–126)
Anion gap: 7 (ref 5–15)
BILIRUBIN TOTAL: 1.1 mg/dL (ref 0.3–1.2)
BUN: 22 mg/dL (ref 8–23)
CO2: 27 mmol/L (ref 22–32)
CREATININE: 0.59 mg/dL — AB (ref 0.61–1.24)
Calcium: 9.8 mg/dL (ref 8.9–10.3)
Chloride: 101 mmol/L (ref 98–111)
GFR calc Af Amer: >60 mL/min (ref >60)
GFR calc non Af Amer: >60 mL/min (ref >60)
Glucose, Bld: 136 mg/dL — ABNORMAL HIGH (ref 70–99)
POTASSIUM: 4 mmol/L (ref 3.5–5.1)
Sodium: 135 mmol/L (ref 135–145)
TOTAL PROTEIN: 5.1 g/dL — AB (ref 6.5–8.1)

## 2018-05-18 LAB — URINALYSIS, COMPLETE (UACMP) WITH MICROSCOPIC
Bacteria, UA: NONE SEEN
Bilirubin Urine: NEGATIVE
GLUCOSE, UA: NEGATIVE mg/dL
Hgb urine dipstick: NEGATIVE
Ketones, ur: NEGATIVE mg/dL
Leukocytes, UA: NEGATIVE
Nitrite: NEGATIVE
PH: 7 (ref 5.0–8.0)
Protein, ur: NEGATIVE mg/dL
Specific Gravity, Urine: 1.011 (ref 1.005–1.030)

## 2018-05-18 LAB — CBC WITH DIFFERENTIAL/PLATELET
BASOS ABS: 0 10*3/uL (ref 0–0.1)
Basophils Relative: 0 %
Eosinophils Absolute: 0.1 10*3/uL (ref 0–0.7)
Eosinophils Relative: 1 %
HEMATOCRIT: 23.2 % — AB (ref 40.0–52.0)
Hemoglobin: 8.5 g/dL — ABNORMAL LOW (ref 13.0–18.0)
LYMPHS ABS: 0.3 10*3/uL — AB (ref 1.0–3.6)
LYMPHS PCT: 4 %
MCH: 38.6 pg — AB (ref 26.0–34.0)
MCHC: 36.5 g/dL — ABNORMAL HIGH (ref 32.0–36.0)
MCV: 105.8 fL — AB (ref 80.0–100.0)
MONO ABS: 0.7 10*3/uL (ref 0.2–1.0)
Monocytes Relative: 11 %
NEUTROS ABS: 5.4 10*3/uL (ref 1.4–6.5)
Neutrophils Relative %: 84 %
Platelets: 168 10*3/uL (ref 150–440)
RBC: 2.19 MIL/uL — ABNORMAL LOW (ref 4.40–5.90)
RDW: 13.3 % (ref 11.5–14.5)
WBC: 6.5 10*3/uL (ref 3.8–10.6)

## 2018-05-18 LAB — LACTIC ACID, PLASMA: Lactic Acid, Venous: 1 mmol/L (ref 0.5–1.9)

## 2018-05-18 LAB — CK: Total CK: 155 U/L (ref 49–397)

## 2018-05-18 LAB — TROPONIN I: TROPONIN I: 0.03 ng/mL — AB (ref <0.03)

## 2018-05-18 MED ORDER — VANCOMYCIN HCL IN DEXTROSE 1-5 GM/200ML-% IV SOLN
1000.0000 mg | Freq: Once | INTRAVENOUS | Status: AC
Start: 2018-05-18 — End: 2018-05-18
  Administered 2018-05-18: 1000 mg via INTRAVENOUS
  Filled 2018-05-18: qty 200

## 2018-05-18 MED ORDER — SODIUM CHLORIDE 0.9 % IV BOLUS
1000.0000 mL | Freq: Once | INTRAVENOUS | Status: AC
Start: 2018-05-18 — End: 2018-05-18
  Administered 2018-05-18: 1000 mL via INTRAVENOUS

## 2018-05-18 MED ORDER — MORPHINE SULFATE (PF) 2 MG/ML IV SOLN
2.0000 mg | Freq: Once | INTRAVENOUS | Status: AC
  Administered 2018-05-18: 2 mg via INTRAVENOUS
  Filled 2018-05-18: qty 1

## 2018-05-18 NOTE — Telephone Encounter (Signed)
Aware of below

## 2018-05-18 NOTE — ED Notes (Signed)
Family at bedside. 

## 2018-05-18 NOTE — ED Provider Notes (Signed)
[facility] Emergency Department Provider Note    ____________________________________________   I have reviewed the triage vital signs and the nursing notes.   HISTORY  Chief Complaint Dehydration   History limited by and level 5 caveat due to: Altered Mental Status, some history obtained from son   HPI [name] is a 77 y.o. male who presents to the emergency department today via EMS because of worsening weakness and confusion. Son states that he has been getting weaker for days. Has not been able to get up out of his chair for the past day. The son also noticed some confusion that started 2 days ago. The son denies that his father has had similar symptoms in the past. The patient himself cannot give any significant history.    Per medical record review patient has a history of MM  Past Medical History:  Diagnosis Date  . Arthritis   . Cancer ([place])    multiple myaloma  . Diabetes mellitus   . Dyspnea   . 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 Hypertension   . Hypothyroidism   . Neuropathy   . Peripheral vascular disease ([place])   . Venous stasis     Patient Active Problem List   Diagnosis Date Noted  . B12 deficiency 09/26/2017  . Multiple myeloma ([place]) 09/08/2017  . Goals of care, counseling/discussion 09/08/2017  . PAD (peripheral artery disease) ([place]) 08/08/2017  . Peripheral neuropathy 07/01/2017  . Hyponatremia 06/20/2017  . Leg cramps 03/30/2017  . Elevated alkaline phosphatase level 01/03/2017  . Elevated serum GGT level 01/03/2017  . Hypothyroidism 04/08/2015  . Benign hypertensive renal disease 04/07/2015  . Leukopenia 04/07/2015  . Type 2 diabetes, controlled, with neuropathy ([place]) 04/07/2015  . 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 Pancytopenia ([place]) 04/07/2015    Past Surgical History:  Procedure Laterality Date  . CATARACT EXTRACTION W/PHACO Right  10/25/2016   Procedure: CATARACT EXTRACTION PHACO AND INTRAOCULAR LENS PLACEMENT (IOC)  right eye complicated;  Surgeon: Ronnell Freshwater, MD;  Location: [place];  Service: Ophthalmology;  Laterality: Right;  Right eye Diabetic - oral meds Vision blue  . FINGER SURGERY      Prior to Admission medications   Medication Sig Start Date End Date Taking? Authorizing Provider  acyclovir (ZOVIRAX) 400 MG tablet Take 1 tablet (400 mg total) by mouth 2 (two) times daily. Patient not taking: Reported on 05/16/2018 10/14/17   Lloyd Huger, MD  amitriptyline (ELAVIL) 25 MG tablet Take 25 mg by mouth at bedtime.    [provider]  dexamethasone (DECADRON) 4 MG tablet Take 5 tablets (20 mg total) by mouth once a week. Take 20 mg on Monday of each week. Patient not taking: Reported on 05/16/2018 12/20/17   Lloyd Huger, MD  folic acid (FOLVITE) 1 MG tablet Take 1 tablet (1 mg total) by mouth daily. Patient not taking: Reported on 05/16/2018 10/24/17   Lloyd Huger, MD  gabapentin (NEURONTIN) 300 MG capsule Take 2 capsules (600 mg total) by mouth 3 (three) times daily. Patient not taking: Reported on 05/16/2018 02/13/18   Lloyd Huger, MD  levothyroxine (SYNTHROID, LEVOTHROID) 75 MCG tablet Take 1 tablet (75 mcg total) daily before breakfast by mouth. 08/16/17   Johnson, Megan P, DO  megestrol (MEGACE) 40 MG tablet Take 1 tablet (40 mg total) by mouth daily. 04/05/18   Grayland Ormond,  Kathlene November, MD  mirtazapine (REMERON) 15 MG tablet Take 1 tablet (15 mg total) by mouth at bedtime. Patient not taking: Reported on 05/16/2018 03/24/18 06/22/18  Trinna Post, PA-C  Multiple Vitamin (MULTIVITAMIN) tablet Take 1 tablet by mouth daily.    [provider]  ondansetron (ZOFRAN) 8 MG tablet  10/14/17   [provider]  potassium chloride SA (K-DUR,KLOR-CON) 20 MEQ tablet Take 1 tablet (20 mEq total) by mouth 2 (two) times daily. 05/09/18   Jacquelin Hawking, NP   prochlorperazine (COMPAZINE) 10 MG tablet Take 1 tablet (10 mg total) by mouth every 6 (six) hours as needed (Nausea or vomiting). Patient not taking: Reported on 05/16/2018 10/14/17   Lloyd Huger, MD  thiamine (VITAMIN B-1) 100 MG tablet Take 1 tablet (100 mg total) by mouth daily. Patient not taking: Reported on 05/16/2018 10/24/17   Lloyd Huger, MD    Allergies Metformin and related  Family History  Problem Relation Age of Onset  . Heart disease Mother   . Alzheimer's disease Sister   . Heart disease Maternal Aunt     Social History Social History   Tobacco Use  . Smoking status: Former Smoker    Packs/day: 0.25    Types: Cigarettes    Last attempt to quit: 03/23/2015    Years since quitting: 3.1  . Smokeless tobacco: Current User    Types: Chew  Substance Use Topics  . Alcohol use: Yes    Alcohol/week: 42.0 standard drinks    Types: 42 Cans of beer per week    Comment: 5-6 every day  . Drug use: No    Review of Systems Unable to obtain reliable ROS secondary to confusion ____________________________________________   PHYSICAL EXAM:  VITAL SIGNS: ED Triage Vitals  Enc Vitals Group     BP 05/18/18 1803 (!) 141/52     Pulse Rate 05/18/18 1801 96     Resp 05/18/18 1801 17     Temp 05/18/18 1801 97.8 F (36.6 C)     Temp Source 05/18/18 1801 Oral     SpO2 05/18/18 1801 97 %     Weight 05/18/18 1802 150 lb (68 kg)     Height 05/18/18 1802 '5\' 8"'  (1.727 m)     Head Circumference --      Peak Flow --      Pain Score 05/18/18 1802 0   Constitutional: Awake and alert. Not completely oriented. Eyes: Conjunctivae are normal.  ENT      Head: Normocephalic and atraumatic.      Nose: No congestion/rhinnorhea.      Mouth/Throat: Mucous membranes are moist.      Neck: No stridor. Hematological/Lymphatic/Immunilogical: No cervical lymphadenopathy. Cardiovascular: Normal rate, regular rhythm.  No murmurs, rubs, or gallops.  Respiratory: Normal respiratory  effort without tachypnea nor retractions. Breath sounds are clear and equal bilaterally. No wheezes/rales/rhonchi. Gastrointestinal: Soft and non tender. No rebound. No guarding.  Genitourinary: Deferred Musculoskeletal: Normal range of motion in all extremities. No lower extremity edema. Neurologic:  Disoriented. Moving all extremities. Generalized weakness.  Skin:  Pressure ulcers and breakdown to buttock and upper back  ____________________________________________    LABS (pertinent positives/negatives)  UA clear, negative nitrite, leukocytes, 0-5 rbc and wbc Lactic 1.0 CBC wbc 6.5, hgb 8.5, plt 168 CMP na 135, k 4.0, glu 136, cr 0.59 Trop 0.03 CK 155  ____________________________________________   EKG  I, Nance Pear, attending physician, personally viewed and interpreted this EKG  EKG Time:  1755 Rate: 94 Rhythm: sinus rhythm Axis: left axis deviation Intervals: qtc 493 QRS: IVCD ST changes: no st elevation Impression: abnormal ekg   ____________________________________________    RADIOLOGY  CXR Left mid lung opacity, question pneumonia  ____________________________________________   PROCEDURES  Procedures  ____________________________________________   INITIAL IMPRESSION / ASSESSMENT AND PLAN / ED COURSE  Pertinent labs & imaging results that were available during my care of the patient were reviewed by me and considered in my medical decision making (see chart for details).   Patient presented to the emergency department today because of concerns for increased weakness over the past couple of days.  On exam patient does have significant skin breakdown on his back.  To have concern for possible infection with this.  Unclear if the etiology is infectious or secondary to his multiple myeloma.  Will plan on admission.  Discussed plan with family and patient  ____________________________________________   FINAL CLINICAL IMPRESSION(S) / ED  DIAGNOSES  Final diagnoses:  Altered mental status, unspecified altered mental status type     Note: This dictation was prepared with Dragon dictation. Any transcriptional errors that result from this process are unintentional     Nance Pear, MD 05/18/18 2337

## 2018-05-18 NOTE — Telephone Encounter (Signed)
Mr. Stiehl son phoned. Mr. Trudo is unable to talk at this time. The son is reporting that over the last 24-48 hours his father seems confused. Stating he sleeping most of the time, unable to stand with his assistance, prior to this he was standing with assist. The patient has been incontinent of bladder and bowels (This is a change). The patient says his legs won't work anymore. He leans to the right while sitting now. Rt arm swollen from hanging down. The patient is sleeping at this time. Son has not noticed if facial features are asymmetrical. He could not get the patient up for an ultrasound yesterday or today. Last seen at Dr. Gary Fleet on Tuesday. Noted the patient's current diagnosis but there has been a significant change of behavior the last 2 days. Recommended he call 911 for paramedics to evaluate patient at this time.

## 2018-05-18 NOTE — ED Notes (Signed)
[name] daughter in [employer]. Call in case of emergency [phone].

## 2018-05-18 NOTE — Telephone Encounter (Signed)
Copied from [place] [phone]. Topic: Inquiry >> May 17, 2018 10:26 AM Oliver Pila B wrote: Reason for CRM: [facility] home health called to let pcp know that pt has been discharged due to non-compliance; insurance will not cover b/c of pt's neglect of care; contact if needed

## 2018-05-18 NOTE — Telephone Encounter (Signed)
Agree to go to ER

## 2018-05-18 NOTE — ED Notes (Signed)
Per son, pt has home health care that visit pt 2X per week.

## 2018-05-18 NOTE — ED Triage Notes (Addendum)
Pt from home via [facility]. Per EMS pt lives with son who reported pt has not been able to walk and use the bathroom in 2 days. Pt is A&O X2 place and person. 567ml NS was given per [facility]. CBG 148.  VSS. Pt denies any pain. NAD noted.

## 2018-05-18 NOTE — ED Notes (Signed)
Pt soiled himself; pt has been changed and cleaned. Decubitus pad has been replaced. Bruising noted on right hip.

## 2018-05-19 ENCOUNTER — Observation Stay: Payer: Medicare Other

## 2018-05-19 DIAGNOSIS — C9000 Multiple myeloma not having achieved remission: Secondary | ICD-10-CM | POA: Diagnosis not present

## 2018-05-19 DIAGNOSIS — Z515 Encounter for palliative care: Secondary | ICD-10-CM | POA: Diagnosis not present

## 2018-05-19 DIAGNOSIS — E039 Hypothyroidism, unspecified: Secondary | ICD-10-CM | POA: Diagnosis not present

## 2018-05-19 DIAGNOSIS — L8990 Pressure ulcer of unspecified site, unspecified stage: Secondary | ICD-10-CM

## 2018-05-19 DIAGNOSIS — Z7189 Other specified counseling: Secondary | ICD-10-CM

## 2018-05-19 DIAGNOSIS — R4182 Altered mental status, unspecified: Secondary | ICD-10-CM

## 2018-05-19 DIAGNOSIS — R531 Weakness: Secondary | ICD-10-CM | POA: Diagnosis not present

## 2018-05-19 DIAGNOSIS — R109 Unspecified abdominal pain: Secondary | ICD-10-CM | POA: Diagnosis not present

## 2018-05-19 DIAGNOSIS — Z135 Encounter for screening for eye and ear disorders: Secondary | ICD-10-CM | POA: Diagnosis not present

## 2018-05-19 DIAGNOSIS — J189 Pneumonia, unspecified organism: Secondary | ICD-10-CM | POA: Diagnosis not present

## 2018-05-19 DIAGNOSIS — S2239XA Fracture of one rib, unspecified side, initial encounter for closed fracture: Secondary | ICD-10-CM | POA: Diagnosis not present

## 2018-05-19 DIAGNOSIS — R4020 Unspecified coma: Secondary | ICD-10-CM | POA: Diagnosis not present

## 2018-05-19 LAB — TSH: TSH: 1.439 u[IU]/mL (ref 0.350–4.500)

## 2018-05-19 LAB — VITAMIN B12: VITAMIN B 12: 611 pg/mL (ref 180–914)

## 2018-05-19 MED ORDER — GERHARDT'S BUTT CREAM
TOPICAL_CREAM | Freq: Four times a day (QID) | CUTANEOUS | Status: DC
  Administered 2018-05-19 – 2018-05-21 (×9): via TOPICAL
  Filled 2018-05-19 (×2): qty 1

## 2018-05-19 MED ORDER — LEVOFLOXACIN IN D5W 750 MG/150ML IV SOLN
750.0000 mg | INTRAVENOUS | Status: DC
  Administered 2018-05-19 – 2018-05-20 (×2): 750 mg via INTRAVENOUS
  Filled 2018-05-19 (×2): qty 150

## 2018-05-19 MED ORDER — ONDANSETRON HCL 4 MG/2ML IJ SOLN: 4.0000 mg | Freq: Four times a day (QID) | INTRAMUSCULAR | Status: DC | PRN

## 2018-05-19 MED ORDER — ONDANSETRON HCL 4 MG PO TABS: 4.0000 mg | ORAL_TABLET | Freq: Four times a day (QID) | ORAL | Status: DC | PRN

## 2018-05-19 MED ORDER — POTASSIUM CHLORIDE CRYS ER 20 MEQ PO TBCR
20.0000 meq | EXTENDED_RELEASE_TABLET | Freq: Two times a day (BID) | ORAL | Status: DC
  Administered 2018-05-19 – 2018-05-21 (×4): 20 meq via ORAL
  Filled 2018-05-19 (×4): qty 1

## 2018-05-19 MED ORDER — ENOXAPARIN SODIUM 40 MG/0.4ML ~~LOC~~ SOLN
40.0000 mg | SUBCUTANEOUS | Status: DC
  Administered 2018-05-19 – 2018-05-20 (×2): 40 mg via SUBCUTANEOUS
  Filled 2018-05-19 (×2): qty 0.4

## 2018-05-19 MED ORDER — SODIUM CHLORIDE 0.9 % IV SOLN
INTRAVENOUS | Status: DC
  Administered 2018-05-19 – 2018-05-20 (×3): via INTRAVENOUS

## 2018-05-19 MED ORDER — LEVOFLOXACIN 500 MG PO TABS: 750.0000 mg | ORAL_TABLET | Freq: Every day | ORAL | Status: DC

## 2018-05-19 MED ORDER — ACETAMINOPHEN 325 MG PO TABS
650.0000 mg | ORAL_TABLET | Freq: Four times a day (QID) | ORAL | Status: DC | PRN
  Administered 2018-05-19: 650 mg via ORAL
  Filled 2018-05-19: qty 2

## 2018-05-19 MED ORDER — LEVOTHYROXINE SODIUM 50 MCG PO TABS
75.0000 ug | ORAL_TABLET | Freq: Every day | ORAL | Status: DC
  Administered 2018-05-20 – 2018-05-21 (×2): 75 ug via ORAL
  Filled 2018-05-19 (×2): qty 2

## 2018-05-19 MED ORDER — HYDRALAZINE HCL 20 MG/ML IJ SOLN: 10.0000 mg | Freq: Four times a day (QID) | INTRAMUSCULAR | Status: DC | PRN

## 2018-05-19 MED ORDER — ACETAMINOPHEN 650 MG RE SUPP: 650.0000 mg | Freq: Four times a day (QID) | RECTAL | Status: DC | PRN

## 2018-05-19 MED ORDER — DOCUSATE SODIUM 100 MG PO CAPS: 100.0000 mg | ORAL_CAPSULE | Freq: Two times a day (BID) | ORAL | Status: DC

## 2018-05-19 NOTE — Consult Note (Signed)
Consultation Note Date: 05/19/2018   Patient Name: [name]  DOB: Aug 14, 1941  MRN: [id number]  Age / Sex: 77 y.o., male  PCP: Martin Roys, DO Referring Physician: Bettey Costa, MD  Reason for Consultation: Establishing goals of care  HPI/Patient Profile: 77 y.o. male  with past medical history of arthritis, multiple myeloma, DM, CAD, PVD, HTN, hypothyroidism, ETOH abuse, and noncompliance admitted on 05/18/2018 with weakness and confusion. Treatment started for CAP and neurology consulted. Per wound care, patient's entire buttocks, scrotum, lower back, and posterior upper thighs have been severely impacted by urinary and fecal incontinence. PMT consulted for [place].  Clinical Assessment and Goals of Care: I have reviewed medical records including EPIC notes, labs and imaging, received report from RN and social work, assessed the patient.  I called the patient's son, [name],  to discuss diagnosis prognosis, GOC, EOL wishes, disposition and options.  My conversation with the patient's son was unusual - to many of my questions he gave bizarre answers. I tried to assess patient's baseline functional status. [name] tells me he had been less mobile over the past month and appetite has not been good for the past year, but has substantially declined in the last month. When I asked questions about [name]'s understanding of his father's illness he tells me he knows he has myeloma but is just ready for him to come home. When attempted to discuss goals of care and explore his father's wishes he tells me things like "I just need something to help him walk at home" and "I know he wants me to have the house". He then shares many concerns he has about the home. I shared my concerns with [name] about his father's illness. He does share "he will never go to a facility". I asked [name] about other family members involved in the patient's care and he gave me permission to  call a relative named [name]   I spoke extensively with [name] [name] is the patient's sister's daughter-in-law. She shares with me that [name] has some cognitive impairments. 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 Also shares that the patient's sister is responsible for [name]'s finances as he is not able to manage them.   [name] tells me about the patient's history of alcohol abuse and medical noncompliance. She tells me that [name] has cared for the patient "as best he could". Tells me he drove the patient to all of his appointments and attended them with him. Shares that [name] gave his father all of his medication but his father had started refusing it. She tells me "[name] has no one, his father is all he has".   She tells me the patient has a spouse, but they are estranged after a significant incident. The spouse lives in a facility and is deaf and [name] does not believe she has decision making ability. She tells me they have not been together for many years. She shares that the patient has a daughter who lives in [place] and is also estranged and they are not in contact. The only other family is the patient's sister, [name]'s mother-in-law, [name] She tells me she is frail but that she could come to the hospital for a meeting.   [name] also shares that she has noticed a significant decline in the patient over the past month. Very weak and not ambulating much. Also not eating much.   I shared my concerns about the patient's prognosis and that he may be nearing the end-of-life  and she tells me she thought he may be too. I also shared my concerns that the patient may not be able to return home and she agrees that he may not be able to receive the level of care he needs at home.   We discussed having a goals of care meeting with all available family - [name], patient's son, [name], and patient's sister, [name] [name] agrees to arrange this and tells me Monday at 10 am they could all be here. She is hopeful to help  support [name]   Primary Decision Maker NEXT OF KIN - ?unclear - not sure if patient's son is capable of serving as surrogate decision maker, daughter and wife are estranged, patient's sister is involved  No HCPOA noted in chart or from family  SUMMARY OF RECOMMENDATIONS   - full code/full scope for now - discussed case with social work - APS involved - [place] meeting planned with available family for 10 AM Monday - at least need OP palliative on discharge  Code Status/Advance Care Planning:  Full code  Symptom Management:   Per primary  Additional Recommendations (Limitations, Scope, Preferences):  Full Scope Treatment  Psycho-social/Spiritual:   Desire for further Chaplaincy support:no  Additional Recommendations: Education on Hospice  Prognosis:   Unable to determine - depends on further work-up and patient's response to treatment, poor prognosis based on patient's functional and nutritional status  Discharge Planning: To Be Determined      Primary Diagnoses: Present on Admission: **None**   I have reviewed the medical record, interviewed the patient and family, and examined the patient. The following aspects are pertinent.  Past Medical History:  Diagnosis Date  . Arthritis   . Cancer ([place])    multiple myaloma  . Diabetes mellitus   . Dyspnea   . 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 Hypertension   . Hypothyroidism   . Neuropathy   . Peripheral vascular disease ([place])   . Venous stasis    Social History   Socioeconomic History  . Marital status: Married    Spouse name: Not on file  . Number of children: 2  . Years of education: Not on file  . Highest education level: Not on file  Occupational History  . Occupation: Custodian  Social Needs  . Financial resource strain: Not on file  . Food insecurity:    Worry: Not on file    Inability: Not on file  . Transportation needs:    Medical: Not on file     Non-medical: Not on file  Tobacco Use  . Smoking status: Former Smoker    Packs/day: 0.25    Types: Cigarettes    Last attempt to quit: 03/23/2015    Years since quitting: 3.1  . Smokeless tobacco: Current User    Types: Chew  Substance and Sexual Activity  . Alcohol use: Yes    Alcohol/week: 42.0 standard drinks    Types: 42 Cans of beer per week    Comment: 5-6 every day  . Drug use: No  . Sexual activity: Not on file    Comment: currently trying to quit now  Lifestyle  . Physical activity:    Days per week: Not on file    Minutes per session: Not on file  . Stress: Not on file  Relationships  . Social connections:    Talks on phone: Not on file    Gets together: Not on file  Attends religious service: Not on file    Active member of club or organization: Not on file    Attends meetings of clubs or organizations: Not on file    Relationship status: Not on file  Other Topics Concern  . Not on file  Social History Narrative   Lives at home with son, ambulates with a cane   Family History  Problem Relation Age of Onset  . Heart disease Mother   . Alzheimer's disease Sister   . Heart disease Maternal Aunt    Scheduled Meds: . docusate sodium  100 mg Oral BID  . enoxaparin (LOVENOX) injection  40 mg Subcutaneous Q24H  . Gerhardt's butt cream   Topical QID  . levothyroxine  75 mcg Oral QAC breakfast  . potassium chloride SA  20 mEq Oral BID   Continuous Infusions: . sodium chloride 100 mL/hr at 05/19/18 1103  . levofloxacin (LEVAQUIN) IV Stopped (05/19/18 1308)   PRN Meds:.acetaminophen **OR** acetaminophen, hydrALAZINE, ondansetron **OR** ondansetron (ZOFRAN) IV Allergies  Allergen Reactions  . Metformin And Related Nausea And Vomiting   Review of Systems  Unable to perform ROS: Mental status change    Physical Exam  Constitutional: He appears lethargic. He appears cachectic. He appears ill. No distress.  Poor hygiene  Eyes: Right eye exhibits exudate.  Left eye exhibits exudate.  Cardiovascular: Normal rate and regular rhythm.  Pulmonary/Chest: Effort normal. No respiratory distress.  Neurological: He appears lethargic. He is disoriented.  Skin: Skin is warm and dry.  Psychiatric: Cognition and memory are impaired.    Vital Signs: BP (!) 144/55 (BP Location: Left Arm)   Pulse 90   Temp 97.7 F (36.5 C) (Oral)   Resp 16   Ht '5\' 7"'  (1.702 m)   Wt 68 kg   SpO2 98%   BMI 23.49 kg/m  Pain Scale: CPOT   Pain Score: Asleep   SpO2: SpO2: 98 % O2 Device:SpO2: 98 % O2 Flow Rate: .   IO: Intake/output summary:   Intake/Output Summary (Last 24 hours) at 05/19/2018 1446 Last data filed at 05/19/2018 0452 Gross per 24 hour  Intake 353.33 ml  Output 1375 ml  Net -1021.67 ml    LBM: Last BM Date: 05/19/18 Baseline Weight: Weight: 68 kg Most recent weight: Weight: 68 kg     Palliative Assessment/Data: PPS 10%     Time Total: 80 minutes Greater than 50%  of this time was spent counseling and coordinating care related to the above assessment and plan.  Juel Burrow, DNP, AGNP-C Palliative Medicine Team [phone] Pager: [phone]

## 2018-05-19 NOTE — Progress Notes (Signed)
Pt back from MRI. RN performs oral care and washes pt face. Pt is now awake talking to RN, Oriented to self only.

## 2018-05-19 NOTE — Progress Notes (Signed)
PT Cancellation Note  Patient Details Name: [name] MRN: [id number] DOB: 1941/04/18   Cancelled Treatment:    Reason Eval/Treat Not Completed: Patient's level of lethargy: PT chart reviewed and attempted Eval, but pt was very lethargic and barely opened eyes with sternal rub and loud speaking. PT will reattempt at a later date.   Rosario Adie, SPT   Rosario Adie 05/19/2018, 11:04 AM

## 2018-05-19 NOTE — Progress Notes (Signed)
Clinical Education officer, museum (CSW) contacted patient's sister's daughter in law [name] Patient's son [name] did give CSW permission to contact [name] Per [name] patient's son [name] has some cognitive delays and can't read or write. Per [facility] doesn't know how to take care of patient in the condition that he is in. Per [name] patient has been able to get up and walk prior to the last couple of days. [name] reported that if patient can improve enough to go home then she would support that. [name] is agreeable to SNF placement if needed and will talk to [facility] about that. Per [name] they are having a family meeting with palliative on Monday to determine goals of care. CSW will continue to follow and assist as needed.   [employer], LCSW [phone]

## 2018-05-19 NOTE — Procedures (Signed)
ELECTROENCEPHALOGRAM REPORT   Patient: [name]       Room #: 151A-AA EEG No. ID: [id number] Age: 77 y.o.        Sex: male Referring Physician: Mody Report Date:  05/19/2018        Interpreting Physician: Alexis Goodell  History: [name] is an 77 y.o. male with altered mental status  Medications:  Colace, Levaquin, Synthroid, K-dur  Conditions of Recording:  This is a 21 channel routine scalp EEG performed with bipolar and monopolar montages arranged in accordance to the international 10/20 system of electrode placement. One channel was dedicated to EKG recording.  The patient is in the awake, drowsy and asleep states.  Description:  The waking background is marred by muscle and movement artifact.  Due to artifact the background is obscured and can not be evaluated.   With drowse the background is slow and irregular, consisting mostly of polymorphic delta activity with some occasional theta noted.     The patient goes in to a light sleep with symmetrical sleep spindles, vertex central sharp transients and irregular slow activity.   No epileptiform activity is noted.   Hyperventilation was not performed. Intermittent photic stimulation was performed but failed to illicit any change in the tracing.   IMPRESSION: This is a normal drowsy and asleep electroencephalogram.  Wakefulness can not be evaluated secondary to the predominance of muscle and movement artifact.  There are no focal lateralizing or epileptiform features.   Alexis Goodell, MD Neurology [phone] 05/19/2018, 6:20 PM

## 2018-05-19 NOTE — Progress Notes (Signed)
Pharmacy Antibiotic Note  [name] is a 77 y.o. male admitted on 05/18/2018 with CAP.  Pharmacy has been consulted for IV levofloxacin dosing.  Plan: Levofloxacin 750 mg IV Q24H - convert to PO when able and recommend at least 5 days treatment.   Height: 5\' 7"  (170.2 cm) Weight: 150 lb (68 kg) IBW/kg (Calculated) : 66.1  Temp (24hrs), Avg:98.1 F (36.7 C), Min:97.7 F (36.5 C), Max:98.8 F (37.1 C)  Recent Labs  Lab 05/18/18 1816 05/18/18 1945  WBC 6.5  --   CREATININE 0.59*  --   LATICACIDVEN  --  1.0    Estimated Creatinine Clearance: 73.4 mL/min (A) (by C-G formula based on SCr of 0.59 mg/dL (L)).    Allergies  Allergen Reactions  . Metformin And Related Nausea And Vomiting    Antimicrobials this admission:   Dose adjustments this admission:   Microbiology results:  BCx:   UCx:    Sputum:    MRSA PCR:   Thank you for allowing pharmacy to be a part of this patient's care.  Laural Benes, Pharm.D., BCPS Clinical Pharmacist 05/19/2018 9:15 AM

## 2018-05-19 NOTE — Progress Notes (Signed)
Pharmacy Antibiotic Note  [name] is a 77 y.o. male admitted on 05/18/2018 with CAP.  Pharmacy has been consulted for levofloxacin dosing.  Plan: Levofloxacin 750 mg PO Q24H for at least 5 days.   Height: 5\' 7"  (170.2 cm) Weight: 150 lb (68 kg) IBW/kg (Calculated) : 66.1  Temp (24hrs), Avg:98.1 F (36.7 C), Min:97.7 F (36.5 C), Max:98.8 F (37.1 C)  Recent Labs  Lab 05/18/18 1816 05/18/18 1945  WBC 6.5  --   CREATININE 0.59*  --   LATICACIDVEN  --  1.0    Estimated Creatinine Clearance: 73.4 mL/min (A) (by C-G formula based on SCr of 0.59 mg/dL (L)).    Allergies  Allergen Reactions  . Metformin And Related Nausea And Vomiting    Antimicrobials this admission:   Dose adjustments this admission:   Microbiology results:  BCx:   UCx:    Sputum:    MRSA PCR:   Thank you for allowing pharmacy to be a part of this patient's care.  Laural Benes, Pharm.D., BCPS Clinical Pharmacist 05/19/2018 8:17 AM

## 2018-05-19 NOTE — Progress Notes (Signed)
Patient briefly seen.  Chart reviewed.  Case discussed with Education officer, museum as well as nursing.  Patient presented with altered mental status and generalized weakness with failure to thrive.  Will order neurology consultation Will order MRI and EEG TSH is within normal limits Will order B1 and B12 Will order palliative care consultation  Will start Levaquin for community-acquired pneumonia

## 2018-05-19 NOTE — Clinical Social Work Placement (Signed)
   CLINICAL SOCIAL WORK PLACEMENT  NOTE  Date:  05/19/2018  Patient Details  Name: [name] MRN: [id number] Date of Birth: 06/05/1941  Clinical Social Work is seeking post-discharge placement for this patient at the [place] level of care (*CSW will initial, date and re-position this form in  chart as items are completed):  Yes   Patient/family provided with [facility] Work Department's list of facilities offering this level of care within the geographic area requested by the patient (or if unable, by the patient's family).  Yes   Patient/family informed of their freedom to choose among providers that offer the needed level of care, that participate in Medicare, Medicaid or managed care program needed by the patient, have an available bed and are willing to accept the patient.  Yes   Patient/family informed of [facility]'s ownership interest in [facility] and [facility], as well as of the fact that they are under no obligation to receive care at these facilities.  PASRR submitted to EDS on 05/19/18     PASRR number received on 05/19/18     Existing PASRR number confirmed on       FL2 transmitted to all facilities in geographic area requested by pt/family on 05/19/18     FL2 transmitted to all facilities within larger geographic area on       Patient informed that his/her managed care company has contracts with or will negotiate with certain facilities, including the following:            Patient/family informed of bed offers received.  Patient chooses bed at       Physician recommends and patient chooses bed at      Patient to be transferred to   on  .  Patient to be transferred to facility by       Patient family notified on   of transfer.  Name of family member notified:        PHYSICIAN       Additional Comment:    _______________________________________________ Sinthia Karabin, Veronia Beets, LCSW 05/19/2018, 3:23 PM

## 2018-05-19 NOTE — Care Management (Signed)
Attempted to give observation notice and no family present. TC to son, Dmari Schubring, [phone]. No answer.

## 2018-05-19 NOTE — Progress Notes (Signed)
eeg completed ° °

## 2018-05-19 NOTE — Progress Notes (Signed)
OT Cancellation Note  Patient Details Name: [name] MRN: [id number] DOB: Mar 14, 1941   Cancelled Treatment:    Reason Eval/Treat Not Completed: Patient's level of consciousness;Fatigue/lethargy limiting ability to participate. Order received, chart reviewed. Pt lethargic, not very responsive to tactile stimuli. Will hold and re-attempt OT evaluation at later date/time as pt is medically appropriate and able to participate.   Jeni Salles, MPH, MS, OTR/L ascom [phone] 05/19/18, 11:49 AM

## 2018-05-19 NOTE — Consult Note (Signed)
[facility] Nurse wound consult note Evaluation of patient completed in [facility].  No family present Reason for Consult: Sacral wound Wound type: Patient's entire buttocks, scrotum, lower back, posterior upper thighs severely impacted by urinary and fecal incontinence, areas consistent with fungal rash also.  This is sevever MASD Injury POA: Yes Wound bed: Extremely erythematous, denuded, satellite lesions around worst areas consistent with [place]a. Plan of care:  Gerhardt's butt cream four times daily. An air mattress. Dermatherapy linen. Float the heels. Turn every 2 hours. Monitor the wound area(s) for worsening of condition such as: Signs/symptoms of infection,  Increase in size,  Development of or worsening of odor, Development of pain, or increased pain at the affected locations.  Notify the medical team if any of these develop.  Thank you for the consult.  Discussed plan of care with the patient and bedside nurse.  [facility] nurse will not follow at this time.  Please re-consult the [facility] [place] team if needed.  Val Riles, RN, MSN, CWOCN, CNS-BC, pager [phone]

## 2018-05-19 NOTE — Evaluation (Addendum)
Physical Therapy Evaluation Patient Details Name: [name] MRN: [id number] DOB: 05/31/1941 Today's Date: 05/19/2018   History of Present Illness  Pt is a 77 y.o. male who was admitted to the hospital for weakness and confusion. past medical history of multiple myeloma, diabetes, hypertension and hypothyroidism presents to the emergency department because his son is concerned that he may be dehydrated.  His son describes that the patient has been unable to get off the couch for the last 2 days. Pt had MRI today (8/9) with no acute findings.  Clinical Impression  Pt is a confused 77 year old male who was admitted for weakness and confusion with PMH listed above. Pt had difficulty following simple commands, and benefited from tactile cueing compared to verbal cueing. Pt was limited in his ability to communicate but was able to answer some simple questions. Pt performs bed mobility with Max A x2, transfers sit<>stand with Max a X2, and ambulates 4' to recliner with Mod A x2 and RW. Pt required extra time and physical assist to sit in recliner because of heavy posterior lean/push.See below for mobility details. Pt demonstrates deficits with functional mobility 2/2 limited ability to follow commands, poor motor planning, posterior pushing, fatigue, and confusion. Pt Would benefit from skilled PT to address above deficits and promote optimal return to PLOF. PT recommends d/c to SNF 2/2 diminished functional mobility compared to baseline. PT will continue to follow patient during hospital stay, and get information from family if possible. This entire session was guided, instructed, and directly supervised by Stephanie Ray, DPT.       Follow Up Recommendations SNF    Equipment Recommendations  Rolling walker with 5" wheels    Recommendations for Other Services       Precautions / Restrictions Restrictions Weight Bearing Restrictions: No      Mobility  Bed Mobility Overal bed mobility: Needs  Assistance Bed Mobility: Supine to Sit     Supine to sit: +2 for physical assistance;Max assist     General bed mobility comments: Pt required Max A x2 beacuse of poor motor planning, confusion, and limited ability to follow commands  Transfers Overall transfer level: Needs assistance Equipment used: Rolling walker (2 wheeled) Transfers: Sit to/from Stand Sit to Stand: Max assist;+2 physical assistance         General transfer comment: Pt required Max A x2 2/2 poor motor planing, confusion, and limited ability to follow commands. Pt benefited from tactile cueing > verbal cueing for transfer.  Ambulation/Gait Ambulation/Gait assistance: +2 physical assistance;Mod assist Gait Distance (Feet): 4 Feet Assistive device: Rolling walker (2 wheeled)       General Gait Details: Pt required Mod A x2 at gait belt, RW, and LEs to ambulate 4' to recliner for safety. Pt had posterior and R side lean throughout standing and ambulation that improved slightly with mutltimodal cueing. Pt was able to progress his LEs with tactile cueing. PT provided physical assist at RW for use and because pt had better response to tactile cueing comapered to verbal cueing.  Stairs            Wheelchair Mobility    Modified Rankin (Stroke Patients Only)       Balance Overall balance assessment: Needs assistance Sitting-balance support: Bilateral upper extremity supported;Feet supported Sitting balance-Leahy Scale: Poor Sitting balance - Comments: Posterior lean that did improve with multimodal cueing   Standing balance support: Bilateral upper extremity supported Standing balance-Leahy Scale: Poor Standing balance comment: R and Posterior   lean                             Pertinent Vitals/Pain Pain Assessment: Faces Faces Pain Scale: Hurts a little bit Pain Location: unclear Pain Intervention(s): Limited activity within patient's tolerance;Repositioned;Monitored during session     Home Living Family/patient expects to be discharged to:: Private residence Living Arrangements: Children               Additional Comments: above is per chart. Pt is poor historian and limited in ability to answer complex questions.    Prior Function Level of Independence: Needs assistance         Comments: Pt is poor historian but per chart and some ability to answer questions. Pt had been walking  prior to hospital stay and lived with his son. How ever for the last 2 days has been non ambulatory.     Hand Dominance   Dominant Hand: Right    Extremity/Trunk Assessment   Upper Extremity Assessment Upper Extremity Assessment: Difficult to assess due to impaired cognition;Generalized weakness    Lower Extremity Assessment Lower Extremity Assessment: Difficult to assess due to impaired cognition;Generalized weakness       Communication   Communication: Expressive difficulties;Receptive difficulties(pt communication is unclear and random)  Cognition Arousal/Alertness: Awake/alert Behavior During Therapy: Anxious(pt was fearful of falling) Overall Cognitive Status: No family/caregiver present to determine baseline cognitive functioning                                 General Comments: pt was oriented to person and place      General Comments      Exercises Other Exercises Other Exercises: Pt insructed in seated there.ex to include hip flexor marching x10, and Min A LAQ x10 all Bilateral. Pt required heavy verbal encouragement   Assessment/Plan    PT Assessment Patient needs continued PT services  PT Problem List Decreased strength;Decreased range of motion;Decreased activity tolerance;Decreased balance;Decreased mobility;Decreased coordination;Decreased cognition;Decreased knowledge of use of DME;Decreased safety awareness       PT Treatment Interventions DME instruction;Gait training;Stair training;Functional mobility training;Therapeutic  activities;Therapeutic exercise;Balance training;Patient/family education    PT Goals (Current goals can be found in the Care Plan section)  Acute Rehab PT Goals Patient Stated Goal: return to PLOF PT Goal Formulation: Patient unable to participate in goal setting Time For Goal Achievement: 06/02/18 Potential to Achieve Goals: Fair    Frequency Min 2X/week   Barriers to discharge   unable to assess home enviorment    Co-evaluation               AM-PAC PT "6 Clicks" Daily Activity  Outcome Measure Difficulty turning over in bed (including adjusting bedclothes, sheets and blankets)?: Unable Difficulty moving from lying on back to sitting on the side of the bed? : Unable Difficulty sitting down on and standing up from a chair with arms (e.g., wheelchair, bedside commode, etc,.)?: Unable Help needed moving to and from a bed to chair (including a wheelchair)?: A Lot Help needed walking in hospital room?: A Lot Help needed climbing 3-5 steps with a railing? : Total 6 Click Score: 8    End of Session Equipment Utilized During Treatment: Gait belt Activity Tolerance: Patient limited by fatigue;Other (comment)(limited by confusion) Patient left: in chair;with call bell/phone within reach;with chair alarm set;with nursing/sitter in room;Other (comment)(legs down 2/2 pushing out with   legs elevated) Nurse Communication: Mobility status PT Visit Diagnosis: Unsteadiness on feet (R26.81);Other abnormalities of gait and mobility (R26.89);Muscle weakness (generalized) (M62.81);Difficulty in walking, not elsewhere classified (R26.2)    Time: 9357-0177 PT Time Calculation (min) (ACUTE ONLY): 39 min   Charges:   PT Evaluation $PT Eval Moderate Complexity: 1 Mod PT Treatments $Therapeutic Exercise: 8-22 mins $Therapeutic Activity: 8-22 mins        Rosario Adie, SPT   Rosario Adie 05/19/2018, 5:02 PM

## 2018-05-19 NOTE — H&P (Signed)
[name] is an 77 y.o. male.   Chief Complaint: Dehydration HPI: The patient with past medical history of multiple myeloma, diabetes, hypertension and hypothyroidism presents to the emergency department because his son is concerned that he may be dehydrated.  His son describes that the patient has been unable to get off the couch for the last 2 days.  He is also been sleeping more.  He was seen at his oncologist a few days ago who suspected weakness secondary to completed chemotherapy.  Today the patient is unable to answer questions regarding history of present illness.  Due to altered mental status and weakness and failure to thrive the emergency department staff called the hospitalist service for admission.  Past Medical History:  Diagnosis Date  . Arthritis   . Cancer ([facility])    multiple myaloma  . Diabetes mellitus   . Dyspnea   . 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 Hypertension   . Hypothyroidism   . Neuropathy   . Peripheral vascular disease ([facility])   . Venous stasis     Past Surgical History:  Procedure Laterality Date  . CATARACT EXTRACTION W/PHACO Right 10/25/2016   Procedure: CATARACT EXTRACTION PHACO AND INTRAOCULAR LENS PLACEMENT (IOC)  right eye complicated;  Surgeon: Ronnell Freshwater, MD;  Location: [facility];  Service: Ophthalmology;  Laterality: Right;  Right eye Diabetic - oral meds Vision blue  . FINGER SURGERY      Family History  Problem Relation Age of Onset  . Heart disease Mother   . Alzheimer's disease Sister   . Heart disease Maternal Aunt    Social History:  reports that he quit smoking about 3 years ago. His smoking use included cigarettes. He smoked 0.25 packs per day. His smokeless tobacco use includes chew. He reports that he drinks about 42.0 standard drinks of alcohol per week. He reports that he does not use drugs.  Allergies:  Allergies  Allergen Reactions  . Metformin And Related  Nausea And Vomiting    Medications Prior to Admission  Medication Sig Dispense Refill  . levothyroxine (SYNTHROID, LEVOTHROID) 75 MCG tablet Take 1 tablet (75 mcg total) daily before breakfast by mouth. 90 tablet 3  . megestrol (MEGACE) 40 MG tablet Take 1 tablet (40 mg total) by mouth daily. 30 tablet 2  . potassium chloride SA (K-DUR,KLOR-CON) 20 MEQ tablet Take 1 tablet (20 mEq total) by mouth 2 (two) times daily. 20 tablet 0  . acyclovir (ZOVIRAX) 400 MG tablet Take 1 tablet (400 mg total) by mouth 2 (two) times daily. (Patient not taking: Reported on 05/16/2018) 60 tablet 3  . dexamethasone (DECADRON) 4 MG tablet Take 5 tablets (20 mg total) by mouth once a week. Take 20 mg on Monday of each week. (Patient not taking: Reported on 05/16/2018) 30 tablet 1  . folic acid (FOLVITE) 1 MG tablet Take 1 tablet (1 mg total) by mouth daily. (Patient not taking: Reported on 05/16/2018) 30 tablet 2  . gabapentin (NEURONTIN) 300 MG capsule Take 2 capsules (600 mg total) by mouth 3 (three) times daily. (Patient not taking: Reported on 05/16/2018) 90 capsule 1  . mirtazapine (REMERON) 15 MG tablet Take 1 tablet (15 mg total) by mouth at bedtime. (Patient not taking: Reported on 05/16/2018) 90 tablet 0  . prochlorperazine (COMPAZINE) 10 MG tablet Take 1 tablet (10 mg total) by mouth every 6 (six) hours as needed (Nausea or vomiting). (Patient not  taking: Reported on 05/16/2018) 60 tablet 2  . thiamine (VITAMIN B-1) 100 MG tablet Take 1 tablet (100 mg total) by mouth daily. (Patient not taking: Reported on 05/16/2018) 30 tablet 2    Results for orders placed or performed during the hospital encounter of 05/18/18 (from the past 48 hour(s))  CBC with Differential     Status: Abnormal   Collection Time: 05/18/18  6:16 PM  Result Value Ref Range   WBC 6.5 3.8 - 10.6 K/uL   RBC 2.19 (L) 4.40 - 5.90 MIL/uL   Hemoglobin 8.5 (L) 13.0 - 18.0 g/dL   HCT 23.2 (L) 40.0 - 52.0 %   MCV 105.8 (H) 80.0 - 100.0 fL   MCH 38.6 (H)  26.0 - 34.0 pg   MCHC 36.5 (H) 32.0 - 36.0 g/dL   RDW 13.3 11.5 - 14.5 %   Platelets 168 150 - 440 K/uL   Neutrophils Relative % 84 %   Neutro Abs 5.4 1.4 - 6.5 K/uL   Lymphocytes Relative 4 %   Lymphs Abs 0.3 (L) 1.0 - 3.6 K/uL   Monocytes Relative 11 %   Monocytes Absolute 0.7 0.2 - 1.0 K/uL   Eosinophils Relative 1 %   Eosinophils Absolute 0.1 0 - 0.7 K/uL   Basophils Relative 0 %   Basophils Absolute 0.0 0 - 0.1 K/uL    Comment: Performed at [facility], [address] [place], [place] [address]  Comprehensive metabolic panel     Status: Abnormal   Collection Time: 05/18/18  6:16 PM  Result Value Ref Range   Sodium 135 135 - 145 mmol/L   Potassium 4.0 3.5 - 5.1 mmol/L   Chloride 101 98 - 111 mmol/L   CO2 27 22 - 32 mmol/L   Glucose, Bld 136 (H) 70 - 99 mg/dL   BUN 22 8 - 23 mg/dL   Creatinine, Ser 0.59 (L) 0.61 - 1.24 mg/dL   Calcium 9.8 8.9 - 10.3 mg/dL   Total Protein 5.1 (L) 6.5 - 8.1 g/dL   Albumin 3.0 (L) 3.5 - 5.0 g/dL   AST 46 (H) 15 - 41 U/L   ALT 46 (H) 0 - 44 U/L   Alkaline Phosphatase 138 (H) 38 - 126 U/L   Total Bilirubin 1.1 0.3 - 1.2 mg/dL   GFR calc non Af Amer >60 >60 mL/min   GFR calc Af Amer >60 >60 mL/min    Comment: (NOTE) The eGFR has been calculated using the CKD EPI equation. This calculation has not been validated in all clinical situations. eGFR's persistently <60 mL/min signify possible Chronic Kidney Disease.    Anion gap 7 5 - 15    Comment: Performed at [facility], [address] [place], [place] [address]  Troponin I     Status: Abnormal   Collection Time: 05/18/18  6:16 PM  Result Value Ref Range   Troponin I 0.03 (HH) <0.03 ng/mL    Comment: CRITICAL RESULT CALLED TO, READ BACK BY AND VERIFIED WITH DEVETTA MCCLAIN ON 05/18/18 AT 2043 JAG Performed at [facility], [address] [place], [place] [address]   CK     Status: None   Collection Time: 05/18/18  6:16 PM  Result Value Ref Range    Total CK 155 49 - 397 U/L    Comment: Performed at [facility], [address] [place], [place] [address]  TSH     Status: None   Collection Time: 05/18/18  6:16 PM  Result Value Ref Range   TSH 1.439 0.350 - 4.500 uIU/mL    Comment: Performed by a 3rd Generation assay with a functional sensitivity of <=0.01 uIU/mL. Performed at [facility], [address] [place], [place] [address]   Lactic acid, plasma     Status: None   Collection Time: 05/18/18  7:45 PM  Result Value Ref Range   Lactic Acid, Venous 1.0 0.5 - 1.9 mmol/L    Comment: Performed at [facility], [address] [place], [place] [address]  Urinalysis, Complete w Microscopic     Status: Abnormal   Collection Time: 05/18/18  8:18 PM  Result Value Ref Range   Color, Urine YELLOW (A) YELLOW   APPearance CLEAR (A) CLEAR   Specific Gravity, Urine 1.011 1.005 - 1.030   pH 7.0 5.0 - 8.0   Glucose, UA NEGATIVE NEGATIVE mg/dL   Hgb urine dipstick NEGATIVE NEGATIVE   Bilirubin Urine NEGATIVE NEGATIVE   Ketones, ur NEGATIVE NEGATIVE mg/dL   Protein, ur NEGATIVE NEGATIVE mg/dL   Nitrite NEGATIVE NEGATIVE   Leukocytes, UA NEGATIVE NEGATIVE   RBC / HPF 0-5 0 - 5 RBC/hpf   WBC, UA 0-5 0 - 5 WBC/hpf   Bacteria, UA NONE SEEN NONE SEEN   Squamous Epithelial / LPF 0-5 0 - 5   Mucus PRESENT     Comment: Performed at [facility], [address] [place], [place] [address]   Dg Chest Portable 1 View  Result Date: 05/18/2018 CLINICAL DATA:  Acute onset of generalized weakness. Altered mental status. EXAM: PORTABLE CHEST 1 VIEW COMPARISON:  Chest radiograph performed 10/21/2017 FINDINGS: The lungs are well-aerated. Minimal left midlung opacity could reflect atelectasis or mild pneumonia. There is no evidence of pleural effusion or pneumothorax. A nonspecific 9 mm density is noted at the right midlung zone. The cardiomediastinal silhouette is within normal limits. No acute osseous abnormalities  are seen. Chronic right-sided rib deformities are noted. IMPRESSION: 1. Minimal left midlung opacity could reflect atelectasis or mild pneumonia. 2. Nonspecific 9 mm density at the right midlung zone. Follow-up chest radiograph with nipple markers would be helpful for further evaluation, when and as deemed clinically appropriate. Electronically Signed   By: Garald Balding M.D.   On: 05/18/2018 23:03    Review of Systems  Unable to perform ROS: Medical condition    Blood pressure (!) 164/60, pulse 90, temperature 98.8 F (37.1 C), temperature source Oral, resp. rate 19, height _0  (1.702 m), weight 68 kg, SpO2 100 %. Physical Exam  Vitals reviewed. Constitutional: He appears well-developed and well-nourished. No distress.  HENT:  Head: Normocephalic and atraumatic.  Mouth/Throat: Oropharynx is clear and moist.  Eyes: Pupils are equal, round, and reactive to light. Conjunctivae and EOM are normal. No scleral icterus.  Neck: Normal range of motion. Neck supple. No JVD present. No tracheal deviation present. No thyromegaly present.  Cardiovascular: Normal rate, regular rhythm and normal heart sounds. Exam reveals no gallop and no friction rub.  No murmur heard. Respiratory: Effort normal and breath sounds normal. Tachypnea noted. No respiratory distress.  GI: Soft. Bowel sounds are normal. He exhibits no distension. There is no tenderness.  Genitourinary:  Genitourinary Comments: Deferred  Musculoskeletal: Normal range of motion. He exhibits no edema.  Lymphadenopathy:    He has no cervical adenopathy.  Neurological: No cranial nerve deficit.  The patient will not respond verbally  Skin: Skin is warm and dry. No rash noted. No erythema.  Severe seborrhea  of scalp and ears  Psychiatric:  The patient is conscious but not responding to verbal commands or questions     Assessment/Plan This is a 77 year old male admitted for weakness. 1.  Weakness: Likely multifactorial.  The patient's  lab work does not indicate dehydration although his skin and overall appearance does support lack of intravenous volume.  Hydrate gently with intravenous saline.  Encourage p.o. intake.  The patient looks frail and borderline cachectic.  Consider life plan.  Palliative care consult. 2.  Multiple myeloma: His recent visit and blood work did not show M spike.  He is completed Velcade therapy.  Consult oncology for further guidance. 3.  Broken ribs: Likely secondary to falls.  Radiology reports that rib fractures appear to be chronic.  Manage pain.  IV morphine as needed for severe pain. 4.  Hypothyroidism: Check TSH; continue Synthroid 5.  DVT prophylaxis: Lovenox 6.  GI prophylaxis: None The patient is a full code.  Time spent on admission orders and patient care proximally 45 minutes  Harrie Foreman, MD 05/19/2018, 2:30 AM

## 2018-05-19 NOTE — NC FL2 (Addendum)
[place] MEDICAID FL2 LEVEL OF CARE SCREENING TOOL     IDENTIFICATION  Patient Name: [name] Birthdate: 08/18/1941 Sex: male Admission Date (Current Location): 05/18/2018  County and Medicaid Number:  [place] ([id number]) Facility and Address:  [facility], [address], [place], [place] [address]      Provider Number: [id number]  Attending Physician Name and Address:  Mody, Sital, MD  Relative Name and Phone Number:       Current Level of Care: Hospital Recommended Level of Care: Skilled Nursing Facility Prior Approval Number:    Date Approved/Denied:   PASRR Number: ([id number])  Discharge Plan: SNF    Current Diagnoses: Patient Active Problem List   Diagnosis Date Noted  . Pressure injury of skin 05/19/2018  . Weakness 05/18/2018  . B12 deficiency 09/26/2017  . Multiple myeloma (HCC) 09/08/2017  . Goals of care, counseling/discussion 09/08/2017  . PAD (peripheral artery disease) (HCC) 08/08/2017  . Peripheral neuropathy 07/01/2017  . Hyponatremia 06/20/2017  . Leg cramps 03/30/2017  . Elevated alkaline phosphatase level 01/03/2017  . Elevated serum GGT level 01/03/2017  . Hypothyroidism 04/08/2015  . Benign hypertensive renal disease 04/07/2015  . Leukopenia 04/07/2015  . Type 2 diabetes, controlled, with neuropathy (HCC) 04/07/2015  . 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 Pancytopenia (HCC) 04/07/2015    Orientation RESPIRATION BLADDER Height & Weight     Self  Normal Incontinent Weight: 68 kg Height:  5' 7" (170.2 cm)  BEHAVIORAL SYMPTOMS/MOOD NEUROLOGICAL BOWEL NUTRITION STATUS      Incontinent Diet(Diet: Heart Healthy )  AMBULATORY STATUS COMMUNICATION OF NEEDS Skin   Total Care Verbally PU Stage and Appropriate Care(pressure ulcers on back and sacrum. )                       Personal Care Assistance Level of Assistance  Bathing, Feeding, Dressing Bathing Assistance: Maximum  assistance Feeding assistance: Maximum assistance Dressing Assistance: Maximum assistance     Functional Limitations Info  Sight, Hearing, Speech Sight Info: Adequate Hearing Info: Adequate Speech Info: Adequate    SPECIAL CARE FACTORS FREQUENCY  PT (By licensed PT), OT (By licensed OT)     PT Frequency: (5) OT Frequency: (5)            Contractures      Additional Factors Info  Code Status, Allergies Code Status Info: (Full Code. ) Allergies Info: (Metformin And Related)           Current Medications (05/19/2018):  This is the current hospital active medication list Current Facility-Administered Medications  Medication Dose Route Frequency Provider Last Rate Last Dose  . 0.9 %  sodium chloride infusion   Intravenous Continuous Diamond, Michael S, MD 100 mL/hr at 05/19/18 1103    . acetaminophen (TYLENOL) tablet 650 mg  650 mg Oral Q6H PRN Diamond, Michael S, MD       Or  . acetaminophen (TYLENOL) suppository 650 mg  650 mg Rectal Q6H PRN Diamond, Michael S, MD      . docusate sodium (COLACE) capsule 100 mg  100 mg Oral BID Diamond, Michael S, MD      . enoxaparin (LOVENOX) injection 40 mg  40 mg Subcutaneous Q24H Diamond, Michael S, MD      . Gerhardt's butt cream   Topical QID Mody, Sital, MD      . hydrALAZINE (APRESOLINE) injection 10 mg  10 mg Intravenous Q6H PRN Mody, Sital,   MD      . levofloxacin (LEVAQUIN) IVPB 750 mg  750 mg Intravenous Q24H Bettey Costa, MD 100 mL/hr at 05/19/18 1207 750 mg at 05/19/18 1207  . levothyroxine (SYNTHROID, LEVOTHROID) tablet 75 mcg  75 mcg Oral QAC breakfast Harrie Foreman, MD      . ondansetron [facility]) tablet 4 mg  4 mg Oral Q6H PRN Harrie Foreman, MD       Or  . ondansetron [facility]) injection 4 mg  4 mg Intravenous Q6H PRN Harrie Foreman, MD      . potassium chloride SA (K-DUR,KLOR-CON) CR tablet 20 mEq  20 mEq Oral BID Harrie Foreman, MD         Discharge Medications: Please see discharge summary for a list  of discharge medications.  Relevant Imaging Results:  Relevant Lab Results:   Additional Information (SSN: [id number])  Sample, Veronia Beets, LCSW

## 2018-05-19 NOTE — Progress Notes (Signed)
Pt has a good appetite but needs assistance with eating and drinking. Pt enjoys the company of staff and watching television. VSS. Will continue to monitor.
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
## 2018-05-20 DIAGNOSIS — Z79890 Hormone replacement therapy (postmenopausal): Secondary | ICD-10-CM | POA: Diagnosis not present

## 2018-05-20 DIAGNOSIS — E1142 Type 2 diabetes mellitus with diabetic polyneuropathy: Secondary | ICD-10-CM | POA: Diagnosis not present

## 2018-05-20 DIAGNOSIS — J189 Pneumonia, unspecified organism: Secondary | ICD-10-CM | POA: Diagnosis present

## 2018-05-20 DIAGNOSIS — C9000 Multiple myeloma not having achieved remission: Secondary | ICD-10-CM | POA: Diagnosis not present

## 2018-05-20 DIAGNOSIS — L89152 Pressure ulcer of sacral region, stage 2: Secondary | ICD-10-CM | POA: Diagnosis not present

## 2018-05-20 DIAGNOSIS — R627 Adult failure to thrive: Secondary | ICD-10-CM | POA: Diagnosis not present

## 2018-05-20 DIAGNOSIS — S2239XA Fracture of one rib, unspecified side, initial encounter for closed fracture: Secondary | ICD-10-CM | POA: Diagnosis not present

## 2018-05-20 DIAGNOSIS — Z888 Allergy status to other drugs, medicaments and biological substances status: Secondary | ICD-10-CM | POA: Diagnosis not present

## 2018-05-20 DIAGNOSIS — R159 Full incontinence of feces: Secondary | ICD-10-CM | POA: Diagnosis not present

## 2018-05-20 DIAGNOSIS — R32 Unspecified urinary incontinence: Secondary | ICD-10-CM | POA: Diagnosis not present

## 2018-05-20 DIAGNOSIS — R531 Weakness: Secondary | ICD-10-CM | POA: Diagnosis not present

## 2018-05-20 DIAGNOSIS — E039 Hypothyroidism, unspecified: Secondary | ICD-10-CM | POA: Diagnosis not present

## 2018-05-20 DIAGNOSIS — R4182 Altered mental status, unspecified: Secondary | ICD-10-CM | POA: Diagnosis present

## 2018-05-20 DIAGNOSIS — Z87891 Personal history of nicotine dependence: Secondary | ICD-10-CM | POA: Diagnosis not present

## 2018-05-20 DIAGNOSIS — Z9181 History of falling: Secondary | ICD-10-CM | POA: Diagnosis not present

## 2018-05-20 DIAGNOSIS — F0390 Unspecified dementia without behavioral disturbance: Secondary | ICD-10-CM | POA: Diagnosis not present

## 2018-05-20 DIAGNOSIS — R296 Repeated falls: Secondary | ICD-10-CM | POA: Diagnosis not present

## 2018-05-20 DIAGNOSIS — I2510 Atherosclerotic heart disease of native coronary artery without angina pectoris: Secondary | ICD-10-CM | POA: Diagnosis not present

## 2018-05-20 DIAGNOSIS — T7601XA Adult neglect or abandonment, suspected, initial encounter: Secondary | ICD-10-CM | POA: Diagnosis not present

## 2018-05-20 DIAGNOSIS — E1151 Type 2 diabetes mellitus with diabetic peripheral angiopathy without gangrene: Secondary | ICD-10-CM | POA: Diagnosis not present

## 2018-05-20 DIAGNOSIS — Z6823 Body mass index (BMI) 23.0-23.9, adult: Secondary | ICD-10-CM | POA: Diagnosis not present

## 2018-05-20 DIAGNOSIS — S2241XD Multiple fractures of ribs, right side, subsequent encounter for fracture with routine healing: Secondary | ICD-10-CM | POA: Diagnosis not present

## 2018-05-20 DIAGNOSIS — I10 Essential (primary) hypertension: Secondary | ICD-10-CM | POA: Diagnosis not present

## 2018-05-20 DIAGNOSIS — Z820 Family history of epilepsy and other diseases of the nervous system: Secondary | ICD-10-CM | POA: Diagnosis not present

## 2018-05-20 LAB — CBC
HCT: 21.9 % — ABNORMAL LOW (ref 40.0–52.0)
Hemoglobin: 7.9 g/dL — ABNORMAL LOW (ref 13.0–18.0)
MCH: 38.2 pg — AB (ref 26.0–34.0)
MCHC: 36.1 g/dL — ABNORMAL HIGH (ref 32.0–36.0)
MCV: 105.9 fL — AB (ref 80.0–100.0)
Platelets: 135 10*3/uL — ABNORMAL LOW (ref 150–440)
RBC: 2.07 MIL/uL — AB (ref 4.40–5.90)
RDW: 13.3 % (ref 11.5–14.5)
WBC: 2.7 10*3/uL — AB (ref 3.8–10.6)

## 2018-05-20 LAB — RETICULOCYTES
RBC.: 2.07 MIL/uL — ABNORMAL LOW (ref 4.40–5.90)
RETIC CT PCT: 3.8 % — AB (ref 0.4–3.1)
Retic Count, Absolute: 78.7 10*3/uL (ref 19.0–183.0)

## 2018-05-20 LAB — BASIC METABOLIC PANEL
ANION GAP: 6 (ref 5–15)
BUN: 14 mg/dL (ref 8–23)
CHLORIDE: 104 mmol/L (ref 98–111)
CO2: 25 mmol/L (ref 22–32)
Calcium: 9.1 mg/dL (ref 8.9–10.3)
Creatinine, Ser: 0.45 mg/dL — ABNORMAL LOW (ref 0.61–1.24)
GFR calc Af Amer: >60 mL/min (ref >60)
GFR calc non Af Amer: >60 mL/min (ref >60)
GLUCOSE: 96 mg/dL (ref 70–99)
POTASSIUM: 3.6 mmol/L (ref 3.5–5.1)
Sodium: 135 mmol/L (ref 135–145)

## 2018-05-20 LAB — IRON AND TIBC
Iron: 45 ug/dL (ref 45–182)
SATURATION RATIOS: 25 % (ref 17.9–39.5)
TIBC: 177 ug/dL — AB (ref 250–450)
UIBC: 132 ug/dL

## 2018-05-20 LAB — PREALBUMIN: Prealbumin: <5 mg/dL — ABNORMAL LOW (ref 18–38)

## 2018-05-20 LAB — VITAMIN B12: Vitamin B-12: 633 pg/mL (ref 180–914)

## 2018-05-20 LAB — TSH: TSH: 1.402 u[IU]/mL (ref 0.350–4.500)

## 2018-05-20 LAB — FERRITIN: FERRITIN: 232 ng/mL (ref 24–336)

## 2018-05-20 LAB — FOLATE: Folate: 16.6 ng/mL (ref >5.9)

## 2018-05-20 LAB — OCCULT BLOOD X 1 CARD TO LAB, STOOL: Fecal Occult Bld: NEGATIVE

## 2018-05-20 MED ORDER — LEVOFLOXACIN 500 MG PO TABS
750.0000 mg | ORAL_TABLET | Freq: Every day | ORAL | Status: DC
  Administered 2018-05-21: 750 mg via ORAL
  Filled 2018-05-20: qty 2

## 2018-05-20 NOTE — Evaluation (Signed)
Occupational Therapy Evaluation Patient Details Name: [name] MRN: [id number] DOB: [birth year] Today's Date: 05/20/2018    History of Present Illness Pt is a 77 y.o. male who was admitted to the hospital for weakness and confusion. past medical history of multiple myeloma, diabetes, hypertension and hypothyroidism presents to the emergency department because his son is concerned that he may be dehydrated.  His son describes that the patient has been unable to get off the couch for two days prior to adm. Pt had MRI (8/9) with no acute findings and normal EEG.   Clinical Impression   Pt seen for OT evaluation this date. Prior to hospital admission, pt was able to ambulate with FWW with supervision and complete UB ADLs including self feeding and grooming, he required help intermittently for bathing and dressing per son.  Pt lives in mobile home with son with 5 STE, shower chair, and BSC over toilet seat to elevate. He was receiving help from his son for med mgt (although per SW note, unclear how well son was managing) transportation to appts, grocery shopping and all other IADLs.  Currently pt demonstrates gross weakness and impairments in strength and activity tolerance requiring  MOD assist for UB ADLs, MAX to total A for LB and MOD A for bed mobility and sit to stand t/f.  Pt would benefit from skilled OT to address noted impairments and functional limitations (see below for any additional details) in order to maximize safety and independence while minimizing falls risk and caregiver burden.  Upon hospital discharge, recommend pt discharge to SNF.    Follow Up Recommendations  SNF    Equipment Recommendations  Other (comment)(defer to next level of care)    Recommendations for Other Services       Precautions / Restrictions Precautions Precautions: Fall Restrictions Weight Bearing Restrictions: No      Mobility Bed Mobility Overal bed mobility: Needs Assistance Bed Mobility:  Supine to Sit;Sit to Supine     Supine to sit: Mod assist Sit to supine: Mod assist   General bed mobility comments: to manage LEs, HOB elevated 45 degrees  Transfers   Equipment used: (arm in arm with gait belt) Transfers: Sit to/from Stand Sit to Stand: Mod assist         General transfer comment: Pt with poor motor planning and difficulty following commands, requires increased time and moderate tactile and visual cues in addition to verbal.    Balance Overall balance assessment: Needs assistance Sitting-balance support: Bilateral upper extremity supported;Feet supported Sitting balance-Leahy Scale: Fair     Standing balance support: Bilateral upper extremity supported Standing balance-Leahy Scale: Poor Standing balance comment: posterior lean                           ADL either performed or assessed with clinical judgement   ADL Overall ADL's : Needs assistance/impaired Eating/Feeding: Set up;Minimal assistance   Grooming: Wash/dry face;Moderate assistance;Sitting           Upper Body Dressing : Moderate assistance;Sitting;Cueing for sequencing   Lower Body Dressing: Maximal assistance(sitting EOB to don socks)                       Vision Baseline Vision/History: (difficult to assess d/t pt with difficulty following commands and baseline info difficult to ascertain)       Perception     Praxis      Pertinent Vitals/Pain Pain Assessment: Faces  Faces Pain Scale: Hurts little more Pain Location: back/bottom with movement Pain Descriptors / Indicators: Aching;Other (Comment)("like sandpaper") Pain Intervention(s): Limited activity within patient's tolerance;Monitored during session     Hand Dominance Right   Extremity/Trunk Assessment Upper Extremity Assessment Upper Extremity Assessment: Generalized weakness;RUE deficits/detail;LUE deficits/detail RUE Deficits / Details: 3-/5 shld flexion/abd, grip 4-/5 LUE Deficits / Details:  3-/5 shld flex/abd, grip 3-/5   Lower Extremity Assessment Lower Extremity Assessment: Defer to PT evaluation;Generalized weakness       Communication Communication Communication: Expressive difficulties;Receptive difficulties   Cognition Arousal/Alertness: Awake/alert Behavior During Therapy: WFL for tasks assessed/performed Overall Cognitive Status: Difficult to assess                                 General Comments: Pt oriented to person, state and knows he is in the hospital but was unable to name which one, requires moderate cues to guess the year, does not know the month or season despite moderate cueing. Difficult to gather what cog baseline is d/t information being difficult to ascertain from son.   General Comments       Exercises Other Exercises Other Exercises: Pt and son educated on role of OT and OT POC as well as safety concerns and fall prevention strategies, requires reinforcement.   Shoulder Instructions      Home Living Family/patient expects to be discharged to:: Private residence Living Arrangements: Children(son ([name])) Available Help at Discharge: Available 24 hours/day;Family Type of Home: Mobile home Home Access: Stairs to enter Entrance Stairs-Number of Steps: 5 Entrance Stairs-Rails: Right Home Layout: One level     Bathroom Shower/Tub: Tub/shower unit   Bathroom Toilet: Standard(BSC over top)     Home Equipment: Walker - 2 wheels;Bedside commode;Shower seat   Additional Comments: Pt is poor historian, son that he lives with presents towards end of evalution and provides some home environment information      Prior Functioning/Environment Level of Independence: Needs assistance  Gait / Transfers Assistance Needed: Son states that pt was walking with FWW with supervision. ADL's / Homemaking Assistance Needed: Son states that he drives pt to appointments, gives pt his medication, gets groceries, sometimes helps with bathing and  dressing when pt is too weak.             OT Problem List: Decreased strength;Decreased range of motion;Decreased activity tolerance;Impaired balance (sitting and/or standing);Decreased safety awareness;Decreased cognition;Decreased knowledge of use of DME or AE;Decreased knowledge of precautions;Pain;Decreased coordination      OT Treatment/Interventions: Self-care/ADL training;Therapeutic exercise;Energy conservation;DME and/or AE instruction;Therapeutic activities;Balance training;Patient/family education    OT Goals(Current goals can be found in the care plan section) Acute Rehab OT Goals Patient Stated Goal: return to PLOF OT Goal Formulation: With patient/family Time For Goal Achievement: 06/03/18 Potential to Achieve Goals: Fair ADL Goals Pt Will Perform Lower Body Dressing: with mod assist(sitting EOB to don socks) Pt Will Transfer to Toilet: with mod assist;stand pivot transfer;bedside commode  OT Frequency: Min 1X/week   Barriers to D/C:            Co-evaluation              AM-PAC PT "6 Clicks" Daily Activity     Outcome Measure Help from another person eating meals?: A Little Help from another person taking care of personal grooming?: A Lot Help from another person toileting, which includes using toliet, bedpan, or urinal?: A Lot Help from another person   bathing (including washing, rinsing, drying)?: A Lot Help from another person to put on and taking off regular upper body clothing?: A Lot Help from another person to put on and taking off regular lower body clothing?: A Lot 6 Click Score: 13   End of Session Equipment Utilized During Treatment: Gait belt Nurse Communication: Mobility status  Activity Tolerance: Patient tolerated treatment well;Patient limited by pain Patient left: in bed;with call bell/phone within reach;with bed alarm set;with family/visitor present  OT Visit Diagnosis: Unsteadiness on feet (R26.81);Other abnormalities of gait and  mobility (R26.89);Muscle weakness (generalized) (M62.81)                Time: 7371-0626 OT Time Calculation (min): 33 min Charges:  OT General Charges $OT Visit: 1 Visit OT Evaluation $OT Eval Moderate Complexity: 1 Mod OT Treatments $Self Care/Home Management : 8-22 mins $Therapeutic Activity: 8-22 mins  Gerrianne Scale, MS, OTR/L ascom [phone] or [phone] 05/20/18, 11:08 AM
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
## 2018-05-20 NOTE — Clinical Social Work Note (Signed)
The CSW contacted the patient's niece-in-law to provide bed offers. [name] chose [facility]. The CSW has updated Claiborne Billings at [facility] and is waiting to find out if they can receive the patient as soon as tomorrow (pending insurance auth and bed availability). The CSW will update as more information is available from [place]. Junie Panning is in agreement with the plan to discharge when medically stable via non-emergent EMS.  Santiago Bumpers, MSW, Latanya Presser [phone]
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
## 2018-05-20 NOTE — Consult Note (Addendum)
Reason for Consult:AMS Referring Physician: Salary  CC: AMS  HPI: [name] is an 77 y.o. male who due to mental status is unable to provide any history.  No family available at this time therefore all history obtained from the chart.  Patient was brought to the emergency department because his son was concerned that he may be dehydrated.  His son described that the patient has been unable to get off the couch for the last 2 days.  He is also sleeping more.  He was seen at his oncologist a few days ago who suspected weakness secondary to completing chemotherapy.  Severe sacral wound noted.  Unclear baseline.    Past Medical History:  Diagnosis Date  . Arthritis   . Cancer ([place])    multiple myaloma  . Diabetes mellitus   . Dyspnea   . 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 Hypertension   . Hypothyroidism   . Neuropathy   . Peripheral vascular disease ([place])   . Venous stasis     Past Surgical History:  Procedure Laterality Date  . CATARACT EXTRACTION W/PHACO Right 10/25/2016   Procedure: CATARACT EXTRACTION PHACO AND INTRAOCULAR LENS PLACEMENT (IOC)  right eye complicated;  Surgeon: Ronnell Freshwater, MD;  Location: [facility];  Service: Ophthalmology;  Laterality: Right;  Right eye Diabetic - oral meds Vision blue  . FINGER SURGERY      Family History  Problem Relation Age of Onset  . Heart disease Mother   . Alzheimer's disease Sister   . Heart disease Maternal Aunt     Social History:  reports that he quit smoking about 3 years ago. His smoking use included cigarettes. He smoked 0.25 packs per day. His smokeless tobacco use includes chew. He reports that he drinks about 42.0 standard drinks of alcohol per week. He reports that he does not use drugs.  Allergies  Allergen Reactions  . Metformin And Related Nausea And Vomiting    Medications:  I have reviewed the patient's current medications. Prior to Admission:   Medications Prior to Admission  Medication Sig Dispense Refill Last Dose  . levothyroxine (SYNTHROID, LEVOTHROID) 75 MCG tablet Take 1 tablet (75 mcg total) daily before breakfast by mouth. 90 tablet 3 05/18/2018 at Unknown time  . megestrol (MEGACE) 40 MG tablet Take 1 tablet (40 mg total) by mouth daily. 30 tablet 2 05/18/2018 at Unknown time  . potassium chloride SA (K-DUR,KLOR-CON) 20 MEQ tablet Take 1 tablet (20 mEq total) by mouth 2 (two) times daily. 20 tablet 0 05/18/2018 at Unknown time  . acyclovir (ZOVIRAX) 400 MG tablet Take 1 tablet (400 mg total) by mouth 2 (two) times daily. (Patient not taking: Reported on 05/16/2018) 60 tablet 3 Not Taking at Unknown time  . dexamethasone (DECADRON) 4 MG tablet Take 5 tablets (20 mg total) by mouth once a week. Take 20 mg on Monday of each week. (Patient not taking: Reported on 05/16/2018) 30 tablet 1 Not Taking at Unknown time  . folic acid (FOLVITE) 1 MG tablet Take 1 tablet (1 mg total) by mouth daily. (Patient not taking: Reported on 05/16/2018) 30 tablet 2 Not Taking at Unknown time  . gabapentin (NEURONTIN) 300 MG capsule Take 2 capsules (600 mg total) by mouth 3 (three) times daily. (Patient not taking: Reported on 05/16/2018) 90 capsule 1 Not Taking at Unknown time  . mirtazapine (REMERON) 15 MG tablet Take 1 tablet (15 mg total)  by mouth at bedtime. (Patient not taking: Reported on 05/16/2018) 90 tablet 0 Not Taking at Unknown time  . prochlorperazine (COMPAZINE) 10 MG tablet Take 1 tablet (10 mg total) by mouth every 6 (six) hours as needed (Nausea or vomiting). (Patient not taking: Reported on 05/16/2018) 60 tablet 2 Not Taking at Unknown time  . thiamine (VITAMIN B-1) 100 MG tablet Take 1 tablet (100 mg total) by mouth daily. (Patient not taking: Reported on 05/16/2018) 30 tablet 2 Not Taking at Unknown time   Scheduled: . docusate sodium  100 mg Oral BID  . enoxaparin (LOVENOX) injection  40 mg Subcutaneous Q24H  . Gerhardt's butt cream   Topical QID  .  [START ON 05/21/2018] levofloxacin  750 mg Oral Daily  . levothyroxine  75 mcg Oral QAC breakfast  . potassium chloride SA  20 mEq Oral BID    ROS: History obtained from the patient  General ROS: negative for - chills, fatigue, fever, night sweats, weight gain or weight loss Psychological ROS: negative for - behavioral disorder, hallucinations, memory difficulties, mood swings or suicidal ideation Ophthalmic ROS: negative for - blurry vision, double vision, eye pain or loss of vision ENT ROS: negative for - epistaxis, nasal discharge, oral lesions, sore throat, tinnitus or vertigo Allergy and Immunology ROS: negative for - hives or itchy/watery eyes Hematological and Lymphatic ROS: negative for - bleeding problems, bruising or swollen lymph nodes Endocrine ROS: negative for - galactorrhea, hair pattern changes, polydipsia/polyuria or temperature intolerance Respiratory ROS: negative for - cough, hemoptysis, shortness of breath or wheezing Cardiovascular ROS: negative for - chest pain, dyspnea on exertion, edema or irregular heartbeat Gastrointestinal ROS: negative for - abdominal pain, diarrhea, hematemesis, nausea/vomiting or stool incontinence Genito-Urinary ROS: negative for - dysuria, hematuria, incontinence or urinary frequency/urgency Musculoskeletal ROS: negative for - joint swelling or muscular weakness Neurological ROS: as noted in HPI Dermatological ROS: negative for rash and skin lesion changes  Physical Examination: Blood pressure (!) 146/59, pulse 77, temperature 98.3 F (36.8 C), temperature source Oral, resp. rate 18, height 5\' 7"  (1.702 m), weight 68 kg, SpO2 97 %.  HEENT-  Normocephalic, no lesions, without obvious abnormality.  Normal external eye and conjunctiva.  Normal TM's bilaterally.  Normal auditory canals and external ears. Normal external nose, mucus membranes and septum.  Normal pharynx. Cardiovascular- S1, S2 normal, pulses palpable throughout   Lungs- chest  clear, no wheezing, rales, normal symmetric air entry Abdomen- soft, non-tender; bowel sounds normal; no masses,  no organomegaly Extremities- no edema Lymph-no adenopathy palpable Musculoskeletal-no joint tenderness, deformity or swelling Skin-sacral wound  Neurological Examination   Mental Status: Alert, oriented to name and place.  He does not know the year, his age or why he is in the hospital.  Speech fluent without evidence of aphasia.  Able to follow 3 step commands with some mild reinforcement. Cranial Nerves: II: Discs flat bilaterally; Visual fields grossly normal, pupils equal, round, reactive to light and accommodation III,IV, VI: ptosis not present, extra-ocular motions intact bilaterally V,VII: smile symmetric, facial light touch sensation normal bilaterally VIII: hearing normal bilaterally IX,X: gag reflex present XI: bilateral shoulder shrug XII: midline tongue extension Motor: Right : Upper extremity   5/5    Left:     Upper extremity   5/5  Lower extremity   5/5     Lower extremity   5/5 Tone and bulk:normal tone throughout; no atrophy noted Sensory: Pinprick and light touch intact throughout, bilaterally Deep Tendon Reflexes: 1+ in the  UEs. Trace at the knees and absent at the ankles.   Plantars: Right: mute   Left: mute Cerebellar: Normal finger-to-nose, normal rapid alternating movements and normal heel-to-shin testing bilaterally Gait: not tested due to safety concerns    Laboratory Studies:   Basic Metabolic Panel: Recent Labs  Lab 05/18/18 1816 05/20/18 0405  NA 135 135  K 4.0 3.6  CL 101 104  CO2 27 25  GLUCOSE 136* 96  BUN 22 14  CREATININE 0.59* 0.45*  CALCIUM 9.8 9.1    Liver Function Tests: Recent Labs  Lab 05/18/18 1816  AST 46*  ALT 46*  ALKPHOS 138*  BILITOT 1.1  PROT 5.1*  ALBUMIN 3.0*   No results for input(s): LIPASE, AMYLASE in the last 168 hours. No results for input(s): AMMONIA in the last 168 hours.  CBC: Recent Labs   Lab 05/18/18 1816 05/20/18 0405  WBC 6.5 2.7*  NEUTROABS 5.4  --   HGB 8.5* 7.9*  HCT 23.2* 21.9*  MCV 105.8* 105.9*  PLT 168 135*    Cardiac Enzymes: Recent Labs  Lab 05/18/18 1816  CKTOTAL 155  TROPONINI 0.03*    BNP: Invalid input(s): POCBNP  CBG: No results for input(s): GLUCAP in the last 168 hours.  Microbiology: Results for orders placed or performed during the hospital encounter of 05/18/18  Blood culture (routine x 2)     Status: None (Preliminary result)   Collection Time: 05/18/18  7:45 PM  Result Value Ref Range Status   Specimen Description BLOOD BLOOD RIGHT HAND  Final   Special Requests   Final    BOTTLES DRAWN AEROBIC AND ANAEROBIC Blood Culture results may not be optimal due to an excessive volume of blood received in culture bottles   Culture   Final    NO GROWTH 2 DAYS Performed at [id number], [address] [place], [place] [address]    Report Status PENDING  Incomplete  Blood culture (routine x 2)     Status: None (Preliminary result)   Collection Time: 05/18/18  7:57 PM  Result Value Ref Range Status   Specimen Description BLOOD RIGHT ANTECUBITAL  Final   Special Requests   Final    BOTTLES DRAWN AEROBIC AND ANAEROBIC Blood Culture adequate volume   Culture   Final    NO GROWTH 2 DAYS Performed at [facility], [address], [place], [place] [address]    Report Status PENDING  Incomplete    Coagulation Studies: No results for input(s): LABPROT, INR in the last 72 hours.  Urinalysis:  Recent Labs  Lab 05/18/18 2018  COLORURINE YELLOW*  LABSPEC 1.011  PHURINE 7.0  GLUCOSEU NEGATIVE  HGBUR NEGATIVE  BILIRUBINUR NEGATIVE  KETONESUR NEGATIVE  PROTEINUR NEGATIVE  NITRITE NEGATIVE  LEUKOCYTESUR NEGATIVE    Lipid Panel:     Component Value Date/Time   CHOL 156 03/30/2017 1003   CHOL 165 04/14/2016 0824   CHOL 107 01/13/2013 0432   TRIG 97 03/30/2017 1003   TRIG 258 (H) 04/14/2016 0824   TRIG 212 (H)  01/13/2013 0432   HDL 50 03/30/2017 1003   HDL 29 (L) 01/13/2013 0432   VLDL 52 (H) 04/14/2016 0824   VLDL 42 (H) 01/13/2013 0432   LDLCALC 87 03/30/2017 1003   LDLCALC 36 01/13/2013 0432    HgbA1C:  Lab Results  Component Value Date   HGBA1C 4.6 (L) 05/09/2018    Urine Drug Screen:  No results found for: LABOPIA, [place], [place], AMPHETMU, THCU, LABBARB  Alcohol Level: No results for input(s): ETH in the last 168 hours.  Other results: EKG: sinus rhythm at 94 bpm.  Imaging: Dg Eye Foreign Body  Result Date: 05/19/2018 CLINICAL DATA:  Metal working/exposure; clearance prior to MRI EXAM: ORBITS FOR FOREIGN BODY - 2 VIEW COMPARISON:  None. FINDINGS: Water's views with eyes deviated toward the left and toward the right obtained. No intraorbital radiopaque foreign body. Aerated paranasal sinuses are clear. No fracture or dislocation. IMPRESSION: No evidence of metallic foreign body within the orbits. Electronically Signed   By: Lowella Grip III M.D.   On: 05/19/2018 12:19   Dg Abd 1 View  Result Date: 05/19/2018 CLINICAL DATA:  Abdominal pain EXAM: ABDOMEN - 1 VIEW COMPARISON:  None. FINDINGS: Scattered large and small bowel gas is noted. No abnormal mass or abnormal calcifications are seen. No radiopaque foreign body is noted. No bony abnormality seen. IMPRESSION: No acute abnormality noted. Electronically Signed   By: Inez Catalina M.D.   On: 05/19/2018 13:22   Mr Brain Wo Contrast  Result Date: 05/19/2018 CLINICAL DATA:  Unexplained altered level of consciousness EXAM: MRI HEAD WITHOUT CONTRAST TECHNIQUE: Multiplanar, multiecho pulse sequences of the brain and surrounding structures were obtained without intravenous contrast. COMPARISON:  None. FINDINGS: Moderately motion degraded study, requiring fast brain protocol. Diffusion imaging is diagnostic and negative for acute infarct. There is generalized cerebral volume loss that is mild for age. Mild chronic small vessel ischemic  change in the cerebral white matter. Small remote right parietal cortex infarct. No hemorrhage, hydrocephalus, or collection. Negative orbits and sinuses.  Grossly preserved major flow voids. IMPRESSION: 1. Senescent changes without acute finding. 2. Moderately motion degraded. Electronically Signed   By: Monte Fantasia M.D.   On: 05/19/2018 14:06   Dg Chest Portable 1 View  Result Date: 05/18/2018 CLINICAL DATA:  Acute onset of generalized weakness. Altered mental status. EXAM: PORTABLE CHEST 1 VIEW COMPARISON:  Chest radiograph performed 10/21/2017 FINDINGS: The lungs are well-aerated. Minimal left midlung opacity could reflect atelectasis or mild pneumonia. There is no evidence of pleural effusion or pneumothorax. A nonspecific 9 mm density is noted at the right midlung zone. The cardiomediastinal silhouette is within normal limits. No acute osseous abnormalities are seen. Chronic right-sided rib deformities are noted. IMPRESSION: 1. Minimal left midlung opacity could reflect atelectasis or mild pneumonia. 2. Nonspecific 9 mm density at the right midlung zone. Follow-up chest radiograph with nipple markers would be helpful for further evaluation, when and as deemed clinically appropriate. Electronically Signed   By: Garald Balding M.D.   On: 05/18/2018 23:03     Assessment/Plan: 77 year old male presenting with altered mental status.  No focality noted on neurological examination.  MRI of the brain reviewed and shows no acute changes.  EEG unremarkable.  LFT's mildly elevated.  B12, folate, TSH normal B1 pending.  Unclear baseline but suspect mental status findings multifactorial.    Agree with current work up.  Would include ammonia.    Alexis Goodell, MD Neurology [phone] 05/20/2018, 2:50 PM

## 2018-05-21 DIAGNOSIS — Z6823 Body mass index (BMI) 23.0-23.9, adult: Secondary | ICD-10-CM | POA: Diagnosis not present

## 2018-05-21 DIAGNOSIS — J189 Pneumonia, unspecified organism: Secondary | ICD-10-CM | POA: Diagnosis not present

## 2018-05-21 DIAGNOSIS — R0602 Shortness of breath: Secondary | ICD-10-CM | POA: Diagnosis not present

## 2018-05-21 DIAGNOSIS — K922 Gastrointestinal hemorrhage, unspecified: Secondary | ICD-10-CM | POA: Diagnosis not present

## 2018-05-21 DIAGNOSIS — M1990 Unspecified osteoarthritis, unspecified site: Secondary | ICD-10-CM | POA: Diagnosis not present

## 2018-05-21 DIAGNOSIS — Z820 Family history of epilepsy and other diseases of the nervous system: Secondary | ICD-10-CM | POA: Diagnosis not present

## 2018-05-21 DIAGNOSIS — I129 Hypertensive chronic kidney disease with stage 1 through stage 4 chronic kidney disease, or unspecified chronic kidney disease: Secondary | ICD-10-CM | POA: Diagnosis not present

## 2018-05-21 DIAGNOSIS — Z79890 Hormone replacement therapy (postmenopausal): Secondary | ICD-10-CM | POA: Diagnosis not present

## 2018-05-21 DIAGNOSIS — Z515 Encounter for palliative care: Secondary | ICD-10-CM | POA: Diagnosis not present

## 2018-05-21 DIAGNOSIS — G9340 Encephalopathy, unspecified: Secondary | ICD-10-CM | POA: Diagnosis not present

## 2018-05-21 DIAGNOSIS — R062 Wheezing: Secondary | ICD-10-CM | POA: Diagnosis not present

## 2018-05-21 DIAGNOSIS — Z8249 Family history of ischemic heart disease and other diseases of the circulatory system: Secondary | ICD-10-CM | POA: Diagnosis not present

## 2018-05-21 DIAGNOSIS — R296 Repeated falls: Secondary | ICD-10-CM | POA: Diagnosis not present

## 2018-05-21 DIAGNOSIS — Z9181 History of falling: Secondary | ICD-10-CM | POA: Diagnosis not present

## 2018-05-21 DIAGNOSIS — I7389 Other specified peripheral vascular diseases: Secondary | ICD-10-CM | POA: Diagnosis not present

## 2018-05-21 DIAGNOSIS — R1111 Vomiting without nausea: Secondary | ICD-10-CM | POA: Diagnosis not present

## 2018-05-21 DIAGNOSIS — C9000 Multiple myeloma not having achieved remission: Secondary | ICD-10-CM | POA: Diagnosis not present

## 2018-05-21 DIAGNOSIS — M361 Arthropathy in neoplastic disease: Secondary | ICD-10-CM | POA: Diagnosis not present

## 2018-05-21 DIAGNOSIS — I2510 Atherosclerotic heart disease of native coronary artery without angina pectoris: Secondary | ICD-10-CM | POA: Diagnosis not present

## 2018-05-21 DIAGNOSIS — R627 Adult failure to thrive: Secondary | ICD-10-CM | POA: Diagnosis not present

## 2018-05-21 DIAGNOSIS — N181 Chronic kidney disease, stage 1: Secondary | ICD-10-CM | POA: Diagnosis not present

## 2018-05-21 DIAGNOSIS — E1140 Type 2 diabetes mellitus with diabetic neuropathy, unspecified: Secondary | ICD-10-CM | POA: Diagnosis not present

## 2018-05-21 DIAGNOSIS — Z66 Do not resuscitate: Secondary | ICD-10-CM | POA: Diagnosis not present

## 2018-05-21 DIAGNOSIS — Z79899 Other long term (current) drug therapy: Secondary | ICD-10-CM | POA: Diagnosis not present

## 2018-05-21 DIAGNOSIS — F17220 Nicotine dependence, chewing tobacco, uncomplicated: Secondary | ICD-10-CM | POA: Diagnosis not present

## 2018-05-21 DIAGNOSIS — N281 Cyst of kidney, acquired: Secondary | ICD-10-CM | POA: Diagnosis not present

## 2018-05-21 DIAGNOSIS — E538 Deficiency of other specified B group vitamins: Secondary | ICD-10-CM | POA: Diagnosis not present

## 2018-05-21 DIAGNOSIS — F0390 Unspecified dementia without behavioral disturbance: Secondary | ICD-10-CM | POA: Diagnosis not present

## 2018-05-21 DIAGNOSIS — R0600 Dyspnea, unspecified: Secondary | ICD-10-CM | POA: Diagnosis not present

## 2018-05-21 DIAGNOSIS — R4182 Altered mental status, unspecified: Secondary | ICD-10-CM | POA: Diagnosis not present

## 2018-05-21 DIAGNOSIS — E0841 Diabetes mellitus due to underlying condition with diabetic mononeuropathy: Secondary | ICD-10-CM | POA: Diagnosis not present

## 2018-05-21 DIAGNOSIS — Z87891 Personal history of nicotine dependence: Secondary | ICD-10-CM | POA: Diagnosis not present

## 2018-05-21 DIAGNOSIS — E1142 Type 2 diabetes mellitus with diabetic polyneuropathy: Secondary | ICD-10-CM | POA: Diagnosis not present

## 2018-05-21 DIAGNOSIS — S2241XD Multiple fractures of ribs, right side, subsequent encounter for fracture with routine healing: Secondary | ICD-10-CM | POA: Diagnosis not present

## 2018-05-21 DIAGNOSIS — R1110 Vomiting, unspecified: Secondary | ICD-10-CM | POA: Diagnosis not present

## 2018-05-21 DIAGNOSIS — L89153 Pressure ulcer of sacral region, stage 3: Secondary | ICD-10-CM | POA: Diagnosis not present

## 2018-05-21 DIAGNOSIS — C9002 Multiple myeloma in relapse: Secondary | ICD-10-CM | POA: Diagnosis not present

## 2018-05-21 DIAGNOSIS — J984 Other disorders of lung: Secondary | ICD-10-CM | POA: Diagnosis not present

## 2018-05-21 DIAGNOSIS — L89152 Pressure ulcer of sacral region, stage 2: Secondary | ICD-10-CM | POA: Diagnosis not present

## 2018-05-21 DIAGNOSIS — E039 Hypothyroidism, unspecified: Secondary | ICD-10-CM | POA: Diagnosis not present

## 2018-05-21 DIAGNOSIS — E1122 Type 2 diabetes mellitus with diabetic chronic kidney disease: Secondary | ICD-10-CM | POA: Diagnosis not present

## 2018-05-21 DIAGNOSIS — K828 Other specified diseases of gallbladder: Secondary | ICD-10-CM | POA: Diagnosis not present

## 2018-05-21 DIAGNOSIS — G3184 Mild cognitive impairment, so stated: Secondary | ICD-10-CM | POA: Diagnosis not present

## 2018-05-21 DIAGNOSIS — E119 Type 2 diabetes mellitus without complications: Secondary | ICD-10-CM | POA: Diagnosis not present

## 2018-05-21 DIAGNOSIS — I10 Essential (primary) hypertension: Secondary | ICD-10-CM | POA: Diagnosis not present

## 2018-05-21 DIAGNOSIS — S2239XA Fracture of one rib, unspecified side, initial encounter for closed fracture: Secondary | ICD-10-CM | POA: Diagnosis not present

## 2018-05-21 DIAGNOSIS — E46 Unspecified protein-calorie malnutrition: Secondary | ICD-10-CM | POA: Diagnosis not present

## 2018-05-21 DIAGNOSIS — E1151 Type 2 diabetes mellitus with diabetic peripheral angiopathy without gangrene: Secondary | ICD-10-CM | POA: Diagnosis not present

## 2018-05-21 DIAGNOSIS — M6281 Muscle weakness (generalized): Secondary | ICD-10-CM | POA: Diagnosis not present

## 2018-05-21 DIAGNOSIS — Z7401 Bed confinement status: Secondary | ICD-10-CM | POA: Diagnosis not present

## 2018-05-21 DIAGNOSIS — D649 Anemia, unspecified: Secondary | ICD-10-CM | POA: Diagnosis not present

## 2018-05-21 DIAGNOSIS — R945 Abnormal results of liver function studies: Secondary | ICD-10-CM | POA: Diagnosis not present

## 2018-05-21 DIAGNOSIS — Z888 Allergy status to other drugs, medicaments and biological substances status: Secondary | ICD-10-CM | POA: Diagnosis not present

## 2018-05-21 DIAGNOSIS — Z681 Body mass index (BMI) 19 or less, adult: Secondary | ICD-10-CM | POA: Diagnosis not present

## 2018-05-21 DIAGNOSIS — K920 Hematemesis: Secondary | ICD-10-CM | POA: Diagnosis not present

## 2018-05-21 DIAGNOSIS — R531 Weakness: Secondary | ICD-10-CM | POA: Diagnosis not present

## 2018-05-21 LAB — CBC
HCT: 24 % — ABNORMAL LOW (ref 40.0–52.0)
HEMOGLOBIN: 8.8 g/dL — AB (ref 13.0–18.0)
MCH: 38.2 pg — ABNORMAL HIGH (ref 26.0–34.0)
MCHC: 36.6 g/dL — ABNORMAL HIGH (ref 32.0–36.0)
MCV: 104.3 fL — ABNORMAL HIGH (ref 80.0–100.0)
Platelets: 140 10*3/uL — ABNORMAL LOW (ref 150–440)
RBC: 2.3 MIL/uL — AB (ref 4.40–5.90)
RDW: 13.4 % (ref 11.5–14.5)
WBC: 3.2 10*3/uL — ABNORMAL LOW (ref 3.8–10.6)

## 2018-05-21 LAB — AMMONIA: AMMONIA: 34 umol/L (ref 9–35)

## 2018-05-21 LAB — VITAMIN B1: Vitamin B1 (Thiamine): 51.8 nmol/L — ABNORMAL LOW (ref 66.5–200.0)

## 2018-05-21 MED ORDER — TAMSULOSIN HCL 0.4 MG PO CAPS
0.4000 mg | ORAL_CAPSULE | Freq: Every day | ORAL | Status: DC
  Administered 2018-05-21: 0.4 mg via ORAL
  Filled 2018-05-21: qty 1

## 2018-05-21 MED ORDER — TAMSULOSIN HCL 0.4 MG PO CAPS: 0.4000 mg | ORAL_CAPSULE | Freq: Every day | ORAL | 0 refills | Status: DC

## 2018-05-21 MED ORDER — MIRTAZAPINE 15 MG PO TABS: 15.0000 mg | ORAL_TABLET | Freq: Every day | ORAL | 0 refills | Status: DC

## 2018-05-21 MED ORDER — LEVOTHYROXINE SODIUM 75 MCG PO TABS
75.0000 ug | ORAL_TABLET | Freq: Every day | ORAL | 0 refills | Status: DC
Start: 2018-05-21 — End: 2018-06-16

## 2018-05-21 MED ORDER — LEVOFLOXACIN 750 MG PO TABS: 750.0000 mg | ORAL_TABLET | Freq: Every day | ORAL | 0 refills | Status: DC

## 2018-05-21 MED ORDER — ENSURE ACTIVE HIGH PROTEIN PO LIQD: 1.0000 | Freq: Three times a day (TID) | ORAL | 0 refills | Status: DC

## 2018-05-21 MED ORDER — GERHARDT'S BUTT CREAM: 1.0000 "application " | TOPICAL_CREAM | Freq: Four times a day (QID) | CUTANEOUS | 0 refills | Status: DC

## 2018-05-21 MED ORDER — ASCORBIC ACID 250 MG PO TABS: 250.0000 mg | ORAL_TABLET | Freq: Two times a day (BID) | ORAL | 0 refills | Status: DC

## 2018-05-21 MED ORDER — FOLIC ACID 1 MG PO TABS: 1.0000 mg | ORAL_TABLET | Freq: Every day | ORAL | 0 refills | Status: DC

## 2018-05-21 MED ORDER — FERROUS SULFATE 325 (65 FE) MG PO TABS: 325.0000 mg | ORAL_TABLET | Freq: Two times a day (BID) | ORAL | 0 refills | Status: DC

## 2018-05-21 MED ORDER — VITAMIN B-1 100 MG PO TABS: 100.0000 mg | ORAL_TABLET | Freq: Every day | ORAL | 0 refills | Status: DC

## 2018-05-21 MED ORDER — FERROUS SULFATE 325 (65 FE) MG PO TABS
325.0000 mg | ORAL_TABLET | Freq: Two times a day (BID) | ORAL | Status: DC
  Administered 2018-05-21: 325 mg via ORAL
  Filled 2018-05-21: qty 1

## 2018-05-21 MED ORDER — MEGESTROL ACETATE 40 MG PO TABS: 40.0000 mg | ORAL_TABLET | Freq: Two times a day (BID) | ORAL | 2 refills | Status: DC

## 2018-05-21 MED ORDER — VITAMIN C 500 MG PO TABS
250.0000 mg | ORAL_TABLET | Freq: Two times a day (BID) | ORAL | Status: DC
  Administered 2018-05-21: 250 mg via ORAL
  Filled 2018-05-21 (×2): qty 0.5

## 2018-05-21 NOTE — Progress Notes (Signed)
Report called to [facility]. EMS called. Waiting on transport.
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
## 2018-05-21 NOTE — Progress Notes (Signed)
Foley catheter removed per verbal order by Dr Jerelyn Charles. Pt tolerated well.

## 2018-05-21 NOTE — Progress Notes (Signed)
Pt alert but confused. Pt Turned q2 hours. Tolerating po fluids without difficulty. Pt able to sleep in between care.

## 2018-05-21 NOTE — Clinical Social Work Note (Signed)
The patient will discharge today to [facility] via non-emergent EMS. The patient, his family, and the facility are aware. The CSW has sent all documentation to the facility, and the discharge packet has been delivered to the chart. The CSW is signing off. Please consult should needs arise.  Santiago Bumpers, MSW, Latanya Presser [phone]

## 2018-05-22 ENCOUNTER — Ambulatory Visit: Payer: Medicare Other

## 2018-05-23 ENCOUNTER — Ambulatory Visit
Admission: RE | Admit: 2018-05-23 | Discharge: 2018-05-23 | Disposition: A | Discharge status: Home / Self Care | Payer: Medicare Other | Source: Ambulatory Visit | Attending: Oncology | Admitting: Oncology

## 2018-05-23 ENCOUNTER — Telehealth: Payer: Self-pay | Admitting: *Deleted

## 2018-05-23 DIAGNOSIS — G3184 Mild cognitive impairment, so stated: Secondary | ICD-10-CM | POA: Diagnosis not present

## 2018-05-23 DIAGNOSIS — C9000 Multiple myeloma not having achieved remission: Secondary | ICD-10-CM | POA: Diagnosis not present

## 2018-05-23 DIAGNOSIS — N281 Cyst of kidney, acquired: Secondary | ICD-10-CM | POA: Diagnosis not present

## 2018-05-23 DIAGNOSIS — E039 Hypothyroidism, unspecified: Secondary | ICD-10-CM | POA: Diagnosis not present

## 2018-05-23 DIAGNOSIS — R627 Adult failure to thrive: Secondary | ICD-10-CM | POA: Diagnosis not present

## 2018-05-23 DIAGNOSIS — R945 Abnormal results of liver function studies: Secondary | ICD-10-CM

## 2018-05-23 DIAGNOSIS — K828 Other specified diseases of gallbladder: Secondary | ICD-10-CM | POA: Insufficient documentation

## 2018-05-23 LAB — CULTURE, BLOOD (ROUTINE X 2)
Culture: NO GROWTH
Culture: NO GROWTH
Special Requests: ADEQUATE

## 2018-05-23 NOTE — Telephone Encounter (Signed)
Called report  CLINICAL DATA:  Abnormal liver function tests.  EXAM: ABDOMEN ULTRASOUND COMPLETE  COMPARISON:  Ultrasound of February 03, 2017.  FINDINGS: Gallbladder: No gallstones or wall thickening visualized. No sonographic Murphy sign noted by sonographer. Mild amount of sludge is noted in gallbladder lumen.  Common bile duct: Diameter: 6 mm which is within normal limits.  Liver: No focal lesion identified. Increased echogenicity of hepatic parenchyma is noted. Portal vein is patent on color Doppler imaging with normal direction of blood flow towards the liver.  IVC: No abnormality visualized.  Pancreas: Visualized portion unremarkable.  Spleen: Size and appearance within normal limits.  Right Kidney: Length: 10.7 cm. 1.4 cm simple cyst is seen in upper pole. Echogenicity within normal limits. No mass or hydronephrosis visualized.  Left Kidney: Length: 9.9 cm. Echogenicity within normal limits. No mass or hydronephrosis visualized.  Abdominal aorta: No aneurysm visualized.  Other findings: None.  IMPRESSION: Increased echogenicity of hepatic parenchyma is noted suggesting fatty infiltration. Small amount of gallbladder sludge is noted. Small right renal cyst is noted. No other abnormality seen in the abdomen.   Electronically Signed   By: Marijo Conception, M.D.   On: 05/23/2018 11:15

## 2018-05-30 DIAGNOSIS — R627 Adult failure to thrive: Secondary | ICD-10-CM | POA: Diagnosis not present

## 2018-05-30 DIAGNOSIS — E039 Hypothyroidism, unspecified: Secondary | ICD-10-CM | POA: Diagnosis not present

## 2018-05-30 DIAGNOSIS — C9000 Multiple myeloma not having achieved remission: Secondary | ICD-10-CM | POA: Diagnosis not present

## 2018-06-02 DIAGNOSIS — Z515 Encounter for palliative care: Secondary | ICD-10-CM | POA: Diagnosis not present

## 2018-06-03 ENCOUNTER — Emergency Department: Payer: Medicare Other

## 2018-06-03 ENCOUNTER — Other Ambulatory Visit: Payer: Self-pay

## 2018-06-03 ENCOUNTER — Inpatient Hospital Stay
Admission: EM | Admit: 2018-06-03 | Discharge: 2018-06-07 | DRG: 378 | Disposition: A | Discharge status: Skilled Nursing Facility | Payer: Medicare Other | Attending: Internal Medicine | Admitting: Internal Medicine

## 2018-06-03 DIAGNOSIS — E538 Deficiency of other specified B group vitamins: Secondary | ICD-10-CM | POA: Diagnosis present

## 2018-06-03 DIAGNOSIS — J984 Other disorders of lung: Secondary | ICD-10-CM | POA: Diagnosis not present

## 2018-06-03 DIAGNOSIS — M1990 Unspecified osteoarthritis, unspecified site: Secondary | ICD-10-CM | POA: Diagnosis not present

## 2018-06-03 DIAGNOSIS — E1140 Type 2 diabetes mellitus with diabetic neuropathy, unspecified: Secondary | ICD-10-CM | POA: Diagnosis not present

## 2018-06-03 DIAGNOSIS — E1122 Type 2 diabetes mellitus with diabetic chronic kidney disease: Secondary | ICD-10-CM | POA: Diagnosis not present

## 2018-06-03 DIAGNOSIS — F0390 Unspecified dementia without behavioral disturbance: Secondary | ICD-10-CM | POA: Diagnosis present

## 2018-06-03 DIAGNOSIS — K920 Hematemesis: Principal | ICD-10-CM

## 2018-06-03 DIAGNOSIS — Z820 Family history of epilepsy and other diseases of the nervous system: Secondary | ICD-10-CM | POA: Diagnosis not present

## 2018-06-03 DIAGNOSIS — E1151 Type 2 diabetes mellitus with diabetic peripheral angiopathy without gangrene: Secondary | ICD-10-CM | POA: Diagnosis present

## 2018-06-03 DIAGNOSIS — N181 Chronic kidney disease, stage 1: Secondary | ICD-10-CM | POA: Diagnosis not present

## 2018-06-03 DIAGNOSIS — I129 Hypertensive chronic kidney disease with stage 1 through stage 4 chronic kidney disease, or unspecified chronic kidney disease: Secondary | ICD-10-CM | POA: Diagnosis present

## 2018-06-03 DIAGNOSIS — F17220 Nicotine dependence, chewing tobacco, uncomplicated: Secondary | ICD-10-CM | POA: Diagnosis present

## 2018-06-03 DIAGNOSIS — D649 Anemia, unspecified: Secondary | ICD-10-CM | POA: Diagnosis present

## 2018-06-03 DIAGNOSIS — Z888 Allergy status to other drugs, medicaments and biological substances status: Secondary | ICD-10-CM

## 2018-06-03 DIAGNOSIS — C9000 Multiple myeloma not having achieved remission: Secondary | ICD-10-CM | POA: Diagnosis not present

## 2018-06-03 DIAGNOSIS — Z79899 Other long term (current) drug therapy: Secondary | ICD-10-CM

## 2018-06-03 DIAGNOSIS — E039 Hypothyroidism, unspecified: Secondary | ICD-10-CM | POA: Diagnosis not present

## 2018-06-03 DIAGNOSIS — E46 Unspecified protein-calorie malnutrition: Secondary | ICD-10-CM | POA: Diagnosis present

## 2018-06-03 DIAGNOSIS — Z8249 Family history of ischemic heart disease and other diseases of the circulatory system: Secondary | ICD-10-CM

## 2018-06-03 DIAGNOSIS — R1110 Vomiting, unspecified: Secondary | ICD-10-CM

## 2018-06-03 DIAGNOSIS — Z681 Body mass index (BMI) 19 or less, adult: Secondary | ICD-10-CM

## 2018-06-03 DIAGNOSIS — G9340 Encephalopathy, unspecified: Secondary | ICD-10-CM | POA: Diagnosis not present

## 2018-06-03 DIAGNOSIS — Z66 Do not resuscitate: Secondary | ICD-10-CM | POA: Diagnosis present

## 2018-06-03 DIAGNOSIS — K922 Gastrointestinal hemorrhage, unspecified: Secondary | ICD-10-CM | POA: Diagnosis present

## 2018-06-03 DIAGNOSIS — Z79890 Hormone replacement therapy (postmenopausal): Secondary | ICD-10-CM

## 2018-06-03 LAB — COMPREHENSIVE METABOLIC PANEL
ALT: 28 U/L (ref 0–44)
ANION GAP: 8 (ref 5–15)
AST: 31 U/L (ref 15–41)
Albumin: 3.2 g/dL — ABNORMAL LOW (ref 3.5–5.0)
Alkaline Phosphatase: 124 U/L (ref 38–126)
BUN: 35 mg/dL — ABNORMAL HIGH (ref 8–23)
CO2: 26 mmol/L (ref 22–32)
Calcium: 9.5 mg/dL (ref 8.9–10.3)
Chloride: 111 mmol/L (ref 98–111)
Creatinine, Ser: 0.66 mg/dL (ref 0.61–1.24)
GFR calc non Af Amer: >60 mL/min (ref >60)
Glucose, Bld: 158 mg/dL — ABNORMAL HIGH (ref 70–99)
POTASSIUM: 4.1 mmol/L (ref 3.5–5.1)
SODIUM: 145 mmol/L (ref 135–145)
TOTAL PROTEIN: 5.1 g/dL — AB (ref 6.5–8.1)
Total Bilirubin: 1.8 mg/dL — ABNORMAL HIGH (ref 0.3–1.2)

## 2018-06-03 LAB — GLUCOSE, CAPILLARY
Glucose-Capillary: 112 mg/dL — ABNORMAL HIGH (ref 70–99)
Glucose-Capillary: 112 mg/dL — ABNORMAL HIGH (ref 70–99)
Glucose-Capillary: 112 mg/dL — ABNORMAL HIGH (ref 70–99)

## 2018-06-03 LAB — CBC
HCT: 28.3 % — ABNORMAL LOW (ref 40.0–52.0)
HEMOGLOBIN: 10.2 g/dL — AB (ref 13.0–18.0)
MCH: 37.6 pg — AB (ref 26.0–34.0)
MCHC: 36.1 g/dL — ABNORMAL HIGH (ref 32.0–36.0)
MCV: 104.2 fL — ABNORMAL HIGH (ref 80.0–100.0)
Platelets: 139 10*3/uL — ABNORMAL LOW (ref 150–440)
RBC: 2.72 MIL/uL — AB (ref 4.40–5.90)
RDW: 14.3 % (ref 11.5–14.5)
WBC: 13 10*3/uL — ABNORMAL HIGH (ref 3.8–10.6)

## 2018-06-03 LAB — PROTIME-INR
INR: 1.29
PROTHROMBIN TIME: 16 s — AB (ref 11.4–15.2)

## 2018-06-03 LAB — TYPE AND SCREEN
ABO/RH(D): A NEG
ANTIBODY SCREEN: NEGATIVE

## 2018-06-03 LAB — HEMOGLOBIN
Hemoglobin: 9.7 g/dL — ABNORMAL LOW (ref 13.0–18.0)
Hemoglobin: 9.7 g/dL — ABNORMAL LOW (ref 13.0–18.0)

## 2018-06-03 LAB — MRSA PCR SCREENING: MRSA BY PCR: POSITIVE — AB

## 2018-06-03 MED ORDER — ALBUTEROL SULFATE (2.5 MG/3ML) 0.083% IN NEBU: 2.5000 mg | INHALATION_SOLUTION | RESPIRATORY_TRACT | Status: DC | PRN

## 2018-06-03 MED ORDER — ACETAMINOPHEN 650 MG RE SUPP: 650.0000 mg | Freq: Four times a day (QID) | RECTAL | Status: DC | PRN

## 2018-06-03 MED ORDER — POLYETHYLENE GLYCOL 3350 17 G PO PACK: 17.0000 g | PACK | Freq: Every day | ORAL | Status: DC | PRN

## 2018-06-03 MED ORDER — ORAL CARE MOUTH RINSE
15.0000 mL | Freq: Two times a day (BID) | OROMUCOSAL | Status: DC
  Administered 2018-06-03 – 2018-06-06 (×7): 15 mL via OROMUCOSAL

## 2018-06-03 MED ORDER — INSULIN ASPART 100 UNIT/ML ~~LOC~~ SOLN
0.0000 [IU] | SUBCUTANEOUS | Status: DC
  Administered 2018-06-06: 1 [IU] via SUBCUTANEOUS
  Filled 2018-06-03: qty 1

## 2018-06-03 MED ORDER — SODIUM CHLORIDE 0.9 % IV SOLN
8.0000 mg/h | INTRAVENOUS | Status: DC
  Administered 2018-06-03 – 2018-06-04 (×3): 8 mg/h via INTRAVENOUS
  Filled 2018-06-03 (×3): qty 80

## 2018-06-03 MED ORDER — SODIUM CHLORIDE 0.9 % IV BOLUS
500.0000 mL | Freq: Once | INTRAVENOUS | Status: AC
  Administered 2018-06-03: 500 mL via INTRAVENOUS

## 2018-06-03 MED ORDER — ACETAMINOPHEN 325 MG PO TABS: 650.0000 mg | ORAL_TABLET | Freq: Four times a day (QID) | ORAL | Status: DC | PRN

## 2018-06-03 MED ORDER — SODIUM CHLORIDE 0.9 % IV SOLN
80.0000 mg | Freq: Once | INTRAVENOUS | Status: AC
  Administered 2018-06-03: 80 mg via INTRAVENOUS
  Filled 2018-06-03: qty 80

## 2018-06-03 MED ORDER — MUPIROCIN 2 % EX OINT
1.0000 "application " | TOPICAL_OINTMENT | Freq: Two times a day (BID) | CUTANEOUS | Status: DC
  Administered 2018-06-03 – 2018-06-06 (×8): 1 via NASAL
  Filled 2018-06-03: qty 22

## 2018-06-03 MED ORDER — ONDANSETRON HCL 4 MG PO TABS: 4.0000 mg | ORAL_TABLET | Freq: Four times a day (QID) | ORAL | Status: DC | PRN

## 2018-06-03 MED ORDER — CHLORHEXIDINE GLUCONATE CLOTH 2 % EX PADS
6.0000 | MEDICATED_PAD | Freq: Every day | CUTANEOUS | Status: DC
  Administered 2018-06-04 – 2018-06-07 (×4): 6 via TOPICAL

## 2018-06-03 MED ORDER — ONDANSETRON HCL 4 MG/2ML IJ SOLN: 4.0000 mg | Freq: Four times a day (QID) | INTRAMUSCULAR | Status: DC | PRN

## 2018-06-03 NOTE — ED Notes (Signed)
X-ray at bedside

## 2018-06-03 NOTE — NC FL2 (Signed)
[place] LEVEL OF CARE SCREENING TOOL     IDENTIFICATION  Patient Name: [name] Birthdate: 1941/03/04 Sex: male Admission Date (Current Location): 06/03/2018  [place] Number:  Selena Lesser [id number] [place] and Address:  [facility], [address] [place], [place] [address]      Provider Number: [id number]  Attending Physician Name and Address:  Hillary Bow, MD  Relative Name and Phone Number:  Hal Neer (Niece) [phone]    Current Level of Care: Hospital Recommended Level of Care: [place] Prior Approval Number:    Date Approved/Denied:   PASRR Number: [id number]  Discharge Plan: SNF    Current Diagnoses: Patient Active Problem List   Diagnosis Date Noted  . UGIB (upper gastrointestinal bleed) 06/03/2018  . CAP (community acquired pneumonia) 05/20/2018  . Pressure injury of skin 05/19/2018  . Altered mental status   . Palliative care by specialist   . Weakness 05/18/2018  . B12 deficiency 09/26/2017  . Multiple myeloma ([place]) 09/08/2017  . Goals of care, counseling/discussion 09/08/2017  . PAD (peripheral artery disease) ([place]) 08/08/2017  . Peripheral neuropathy 07/01/2017  . Hyponatremia 06/20/2017  . Leg cramps 03/30/2017  . Elevated alkaline phosphatase level 01/03/2017  . Elevated serum GGT level 01/03/2017  . Hypothyroidism 04/08/2015  . Benign hypertensive renal disease 04/07/2015  . Leukopenia 04/07/2015  . Type 2 diabetes, controlled, with neuropathy ([place]) 04/07/2015  . 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 Pancytopenia ([place]) 04/07/2015    Orientation RESPIRATION BLADDER Height & Weight     Self, Situation, Place  Normal Incontinent Weight: 129 lb (58.5 kg) Height:  _0  (170.2 cm)  BEHAVIORAL SYMPTOMS/MOOD NEUROLOGICAL BOWEL NUTRITION STATUS      Incontinent Diet(NPO to be advanced to Heart Healthy)  AMBULATORY STATUS COMMUNICATION OF  NEEDS Skin   Total Care Verbally PU Stage and Appropriate Care(PU Stage and Appropriate Care(pressure ulcers on back and sacrum. ))                       Personal Care Assistance Level of Assistance  Bathing, Feeding, Dressing Bathing Assistance: Limited assistance Feeding assistance: Limited assistance Dressing Assistance: Limited assistance     Functional Limitations Info    Sight Info: Adequate Hearing Info: Adequate Speech Info: Adequate    SPECIAL CARE FACTORS FREQUENCY  PT (By licensed PT), OT (By licensed OT)                    Contractures Contractures Info: Not present    Additional Factors Info  Code Status, Allergies Code Status Info: Full Allergies Info: Metformin And Related           Current Medications (06/03/2018):  This is the current hospital active medication list Current Facility-Administered Medications  Medication Dose Route Frequency Provider Last Rate Last Dose  . acetaminophen (TYLENOL) tablet 650 mg  650 mg Oral Q6H PRN Hillary Bow, MD       Or  . acetaminophen (TYLENOL) suppository 650 mg  650 mg Rectal Q6H PRN Sudini, Srikar, MD      . albuterol (PROVENTIL) (2.5 MG/3ML) 0.083% nebulizer solution 2.5 mg  2.5 mg Nebulization Q2H PRN Sudini, Alveta Heimlich, MD      . Derrill Memo ON 06/04/2018] Chlorhexidine Gluconate Cloth 2 % PADS 6 each  6 each Topical Q0600 Sudini, Srikar, MD      . insulin aspart (novoLOG) injection 0-9 Units  0-9 Units  Subcutaneous Q4H Sudini, Alveta Heimlich, MD      . MEDLINE mouth rinse  15 mL Mouth Rinse BID Hillary Bow, MD   15 mL at 06/03/18 1058  . mupirocin ointment (BACTROBAN) 2 % 1 application  1 application Nasal BID Hillary Bow, MD   1 application at 22/97/98 1419  . ondansetron (ZOFRAN) tablet 4 mg  4 mg Oral Q6H PRN Hillary Bow, MD       Or  . ondansetron (ZOFRAN) injection 4 mg  4 mg Intravenous Q6H PRN Sudini, Srikar, MD      . pantoprazole (PROTONIX) 80 mg in sodium chloride 0.9 % 250 mL (0.32 mg/mL)  infusion  8 mg/hr Intravenous Continuous Schuyler Amor, MD 25 mL/hr at 06/03/18 1400 8 mg/hr at 06/03/18 1400  . polyethylene glycol (MIRALAX / GLYCOLAX) packet 17 g  17 g Oral Daily PRN Hillary Bow, MD         Discharge Medications: Please see discharge summary for a list of discharge medications.  Relevant Imaging Results:  Relevant Lab Results:   Additional Information [phone]  Zettie [name], LCSW

## 2018-06-03 NOTE — ED Notes (Signed)
Fecal occult negative 

## 2018-06-03 NOTE — Progress Notes (Signed)
Admit for upper GI bleed. Coffee ground/black emesis. Hb stable but some tachycardia. Serial Hb protonix drip GI consult.

## 2018-06-03 NOTE — Plan of Care (Signed)
  Problem: Safety: Goal: Ability to remain free from injury will improve Outcome: Progressing   Problem: Education: Goal: Ability to identify signs and symptoms of gastrointestinal bleeding will improve Outcome: Not Progressing   Problem: Bowel/Gastric: Goal: Will show no signs and symptoms of gastrointestinal bleeding Outcome: Not Progressing  06/03/18 Hgb @ 0813 = 10.2; @ 1515 = 9.7

## 2018-06-03 NOTE — H&P (Signed)
[facility] [place] NAME: Isam Unrein    MR#:  [id number]  DATE OF BIRTH:  [birth year]  DATE OF ADMISSION:  06/03/2018  PRIMARY CARE PHYSICIAN: Valerie Roys, DO   REQUESTING/REFERRING PHYSICIAN: Mcshane  CHIEF COMPLAINT:   Chief Complaint  Patient presents with  . Hematemesis    HISTORY OF PRESENT ILLNESS:  Minard Millirons  is a 77 y.o. male with a known history of dementia, multiple myeloma, hypertension, neuropathy, peripheral vascular disease, diabetes mellitus presented to the emergency room from [facility] due to having coffee-ground/black emesis.  Here in the emergency room he has been found to be tachycardic and is being admitted for further work-up of upper GI bleed.  Not on any anticoagulants.  Patient with dementia unable to contribute to history.  History obtained from son at bedside.  Unfortunately son is not aware of much of his dad's medical history and seems to be poor historian himself.  PAST MEDICAL HISTORY:   Past Medical History:  Diagnosis Date  . Arthritis   . Cancer ([place])    multiple myaloma  . Diabetes mellitus   . Dyspnea   . 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 Hypertension   . Hypothyroidism   . Neuropathy   . Peripheral vascular disease ([place])   . Venous stasis     PAST SURGICAL HISTORY:   Past Surgical History:  Procedure Laterality Date  . CATARACT EXTRACTION W/PHACO Right 10/25/2016   Procedure: CATARACT EXTRACTION PHACO AND INTRAOCULAR LENS PLACEMENT (IOC)  right eye complicated;  Surgeon: Ronnell Freshwater, MD;  Location: [facility];  Service: Ophthalmology;  Laterality: Right;  Right eye Diabetic - oral meds Vision blue  . FINGER SURGERY      SOCIAL HISTORY:   Social History   Tobacco Use  . Smoking status: Former Smoker    Packs/day: 0.25    Types: Cigarettes    Last attempt to quit: 03/23/2015    Years since  quitting: 3.2  . Smokeless tobacco: Current User    Types: Chew  Substance Use Topics  . Alcohol use: Yes    Alcohol/week: 42.0 standard drinks    Types: 42 Cans of beer per week    Comment: 5-6 every day    FAMILY HISTORY:   Family History  Problem Relation Age of Onset  . Heart disease Mother   . Alzheimer's disease Sister   . Heart disease Maternal Aunt     DRUG ALLERGIES:   Allergies  Allergen Reactions  . Metformin And Related Nausea And Vomiting    REVIEW OF SYSTEMS:   Review of Systems  Unable to perform ROS: Dementia    MEDICATIONS AT HOME:   Prior to Admission medications   Medication Sig Start Date End Date Taking? Authorizing Provider  ferrous sulfate 325 (65 FE) MG tablet Take 1 tablet (325 mg total) by mouth 2 (two) times daily with a meal. 05/21/18  Yes Salary, Montell D, MD  folic acid (FOLVITE) 1 MG tablet Take 1 tablet (1 mg total) by mouth daily. 05/21/18  Yes Salary, Montell D, MD  ipratropium-albuterol (DUONEB) 0.5-2.5 (3) MG/3ML SOLN Take 3 mLs by nebulization every 4 (four) hours as needed (shortness of breath or wheezing). 06/02/18 06/16/18 Yes [provider]  levothyroxine (SYNTHROID, LEVOTHROID) 75 MCG tablet Take 1 tablet (75 mcg total) by mouth daily before breakfast. 05/21/18  Yes Salary,  Montell D, MD  LORazepam (ATIVAN) 2 MG/ML concentrated solution Take 1 mg by mouth every 4 (four) hours as needed for anxiety. 05/25/18 06/08/18 Yes [provider]  megestrol (MEGACE) 40 MG tablet Take 1 tablet (40 mg total) by mouth 2 (two) times daily. 05/21/18  Yes Salary, Avel Peace, MD  mirtazapine (REMERON) 15 MG tablet Take 1 tablet (15 mg total) by mouth at bedtime. 05/21/18 08/19/18 Yes Salary, Avel Peace, MD  potassium chloride SA (K-DUR,KLOR-CON) 20 MEQ tablet Take 1 tablet (20 mEq total) by mouth 2 (two) times daily. 05/09/18  Yes Jacquelin Hawking, NP  tamsulosin (FLOMAX) 0.4 MG CAPS capsule Take 1 capsule (0.4 mg total) by mouth daily after  breakfast. 05/21/18  Yes Salary, Montell D, MD  thiamine (VITAMIN B-1) 100 MG tablet Take 1 tablet (100 mg total) by mouth daily. 05/21/18  Yes Salary, Avel Peace, MD  vitamin C (VITAMIN C) 250 MG tablet Take 1 tablet (250 mg total) by mouth 2 (two) times daily. 05/21/18  Yes Salary, Avel Peace, MD  Hydrocortisone (GERHARDT'S BUTT CREAM) CREA Apply 1 application topically 4 (four) times daily. Patient not taking: Reported on 06/03/2018 05/21/18   Salary, Avel Peace, MD  levofloxacin (LEVAQUIN) 750 MG tablet Take 1 tablet (750 mg total) by mouth daily. Patient not taking: Reported on 06/03/2018 05/22/18   Salary, Avel Peace, MD  Nutritional Supplements (ENSURE ACTIVE HIGH PROTEIN) LIQD Take 1 Can by mouth 3 (three) times daily with meals. Patient not taking: Reported on 06/03/2018 05/21/18   Salary, Holly Bodily D, MD     VITAL SIGNS:  Blood pressure (!) 125/58, pulse 96, temperature 98.5 F (36.9 C), temperature source Oral, resp. rate 17, height '5\' 7"'  (1.702 m), weight 58.5 kg, SpO2 96 %.  PHYSICAL EXAMINATION:  Physical Exam  GENERAL:  77 y.o.-year-old patient lying in the bed with no acute distress.  EYES: Pupils equal, round, reactive to light and accommodation. No scleral icterus. Extraocular muscles intact.  HEENT: Head atraumatic, normocephalic. Oropharynx and nasopharynx clear. No oropharyngeal erythema, moist oral mucosa  NECK:  Supple, no jugular venous distention. No thyroid enlargement, no tenderness.  LUNGS: Normal breath sounds bilaterally, no wheezing, rales, rhonchi. No use of accessory muscles of respiration.  CARDIOVASCULAR: S1, S2 normal. No murmurs, rubs, or gallops.  ABDOMEN: Soft, nontender, nondistended. Bowel sounds present. No organomegaly or mass.  EXTREMITIES: No pedal edema, cyanosis, or clubbing.  NEUROLOGIC: Moves all 4 extremities.  Not following instructions Psychiatry-drowsy  LABORATORY PANEL:   CBC Recent Labs  Lab 06/03/18 0813  WBC 13.0*  HGB 10.2*  HCT 28.3*   PLT 139*   ------------------------------------------------------------------------------------------------------------------  Chemistries  Recent Labs  Lab 06/03/18 0813  NA 145  K 4.1  CL 111  CO2 26  GLUCOSE 158*  BUN 35*  CREATININE 0.66  CALCIUM 9.5  AST 31  ALT 28  ALKPHOS 124  BILITOT 1.8*   ------------------------------------------------------------------------------------------------------------------  Cardiac Enzymes No results for input(s): TROPONINI in the last 168 hours. ------------------------------------------------------------------------------------------------------------------  RADIOLOGY:  Dg Chest Port 1 View  Result Date: 06/03/2018 CLINICAL DATA:  Vomiting EXAM: PORTABLE CHEST 1 VIEW COMPARISON:  05/18/2018 chest radiograph. FINDINGS: Stable cardiomediastinal silhouette with normal heart size. No pneumothorax. No pleural effusion. No pulmonary edema. Small vague density overlying the peripheral right mid lung is stable. No acute consolidative airspace disease. IMPRESSION: No acute cardiopulmonary disease. Persistent small vague density overlying the peripheral right mid lung, unchanged since 05/18/2018 chest radiograph. Suggest short-term outpatient follow-up chest CT to exclude  a pulmonary nodule in this location. Electronically Signed   By: Ilona Sorrel M.D.   On: 06/03/2018 09:04     IMPRESSION AND PLAN:   *Upper GI bleed.  We will bolus 500 mL normal saline stat.  Check serial hemoglobins. Consult GI.  Protonix drip.  Transfuse if hemoglobin less than 7  *Dementia with encephalopathy.  This has been a progressive problem.  Patient was worked up extensively during recent admission with MRI of the brain which was normal.  EEG showed nothing acute.  Likely has progressively worsening dementia.  *Diabetes mellitus.  N.p.o.  Every 4 Accu-Cheks.  *Hypertension.  Hold medications.  DVT prophylaxis with SCDs  Discussed extensively with son at  bedside.  It is on clear if son is able to comprehend all the medical details at this time.  Poor historian.  No documented healthcare power of attorney.  Will consult palliative care on Monday.  Overall poor prognosis and would be hospice appropriate.  All the records are reviewed and case discussed with ED provider. Management plans discussed with the patient, family and they are in agreement.  CODE STATUS: Full code  TOTAL  CRITICAL CARE TIME TAKING CARE OF THIS PATIENT: 80 minutes.   Leia Alf Jilberto Vanderwall M.D on 06/03/2018 at 1:54 PM  Between 7am to 6pm - Pager - [phone]  After 6pm go to [email or web address] - password [email or web address] [facility]  Office  [phone]  CC: Primary care physician; Valerie Roys, DO  Note: This dictation was prepared with Dragon dictation along with smaller phrase technology. Any transcriptional errors that result from this process are unintentional.

## 2018-06-03 NOTE — ED Provider Notes (Signed)
[name], [name], Martin hospital notes.    HISTORY  Chief Complaint Hematemesis    HPI [name] is a 77 y.o. male who presents today with very limited history.  Patient apparently had 2 episodes of black vomiting today.  Per his med list he is not on [name] blood thinners. Seems somewhat weak according to caretakers.  Patient is a 77 year old male with multiple medical problems [name] decubitus ulcers, no known melena or bright red blood per rectum.  Level 5 chart caveat; no further history available due to patient status.    Past Medical History:  Diagnosis Date  . Arthritis   . Cancer ([place])    multiple myaloma  . Diabetes mellitus   . Dyspnea   . 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 Hypertension   . Hypothyroidism   . Neuropathy   . Peripheral vascular disease ([place])   . Venous stasis     Patient Active Problem List   Diagnosis Date Noted  . CAP (community acquired pneumonia) 05/20/2018  . Pressure injury of skin 05/19/2018  . Altered mental status   . Palliative care by specialist   . Weakness 05/18/2018  . B12 deficiency 09/26/2017  . Multiple myeloma ([place]) 09/08/2017  . Goals of care, counseling/discussion 09/08/2017  . PAD (peripheral artery disease) ([place]) 08/08/2017  . Peripheral neuropathy 07/01/2017  . Hyponatremia 06/20/2017  . Leg cramps 03/30/2017  . Elevated alkaline phosphatase level 01/03/2017  . Elevated serum GGT level 01/03/2017  . Hypothyroidism 04/08/2015  . Benign hypertensive renal disease 04/07/2015  . Leukopenia 04/07/2015  . Type 2 diabetes, controlled, with neuropathy ([place]) 04/07/2015  . 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  Pancytopenia ([place]) 04/07/2015    Past Surgical History:  Procedure Laterality Date  . CATARACT EXTRACTION W/PHACO Right 10/25/2016   Procedure: CATARACT EXTRACTION PHACO [name] INTRAOCULAR LENS PLACEMENT (IOC)  right eye complicated;  Surgeon: Ronnell Freshwater, MD;  Location: [facility];  Service: Ophthalmology;  Laterality: Right;  Right eye Diabetic - oral meds Vision blue  . FINGER SURGERY      Prior to Admission medications   Medication Sig Start Date End Date Taking? Authorizing Provider  ferrous sulfate 325 (65 FE) MG tablet Take 1 tablet (325 mg total) by mouth 2 (two) times daily with a meal. 05/21/18   Salary, Avel Peace, MD  folic acid (FOLVITE) 1 MG tablet Take 1 tablet (1 mg total) by mouth daily. 05/21/18   Salary, Avel Peace, MD  Hydrocortisone (GERHARDT'S BUTT CREAM) CREA Apply 1 application topically 4 (four) times daily. 05/21/18   Salary, Avel Peace, MD  levofloxacin (LEVAQUIN) 750 MG tablet Take 1 tablet (750 mg total) by mouth daily. 05/22/18   Salary, Avel Peace, MD  levothyroxine (SYNTHROID, LEVOTHROID) 75 MCG tablet Take 1 tablet (75 mcg total) by mouth daily before breakfast. 05/21/18   Salary, Avel Peace, MD  megestrol (MEGACE) 40 MG tablet Take 1 tablet (40 mg total) by mouth 2 (two) times daily. 05/21/18   Salary, Avel Peace, MD  mirtazapine (REMERON) 15 MG tablet Take 1 tablet (15 mg total) by mouth at bedtime. 05/21/18 08/19/18  Salary, Avel Peace, MD  Nutritional Supplements (ENSURE ACTIVE HIGH PROTEIN) LIQD Take  1 Can by mouth 3 (three) times daily with meals. 05/21/18   Salary, Avel Peace, MD  potassium chloride SA (K-DUR,KLOR-CON) 20 MEQ tablet Take 1 tablet (20 mEq total) by mouth 2 (two) times daily. 05/09/18   Jacquelin Hawking, NP  tamsulosin (FLOMAX) 0.4 MG CAPS capsule Take 1 capsule (0.4 mg total) by mouth daily after breakfast. 05/21/18   Salary, Avel Peace, MD  thiamine (VITAMIN B-1) 100 MG tablet Take 1 tablet (100 mg total) by mouth daily. 05/21/18    Salary, Avel Peace, MD  vitamin C (VITAMIN C) 250 MG tablet Take 1 tablet (250 mg total) by mouth 2 (two) times daily. 05/21/18   Salary, Avel Peace, MD    Allergies Metformin [name] related  Family History  Problem Relation Age of Onset  . Heart disease Mother   . Alzheimer's disease Sister   . Heart disease Maternal Aunt     Social History Social History   Tobacco Use  . Smoking status: Former Smoker    Packs/day: 0.25    Types: Cigarettes    Last attempt to quit: 03/23/2015    Years since quitting: 3.2  . Smokeless tobacco: Current User    Types: Chew  Substance Use Topics  . Alcohol use: Yes    Alcohol/week: 42.0 standard drinks    Types: 42 Cans of beer per week    Comment: 5-6 every day  . Drug use: No    Review of Systems Level 5 chart caveat; no further history available due to patient status.  ____________________________________________   PHYSICAL EXAM:  VITAL SIGNS: ED Triage Vitals [06/03/18 0814]  Enc Vitals Group     BP 127/62     Pulse Rate (!) 102     Resp 12     Temp 98.2 F (36.8 C)     Temp src      SpO2 96 %     Weight 129 lb (58.5 kg)     Height '5\' 7"'  (1.702 m)     Head Circumference      Peak Flow      Pain Score      Pain Loc      Pain Edu?      Excl. in [place]?     Constitutional: Awake [name] in no acute distress, follows commands, will tell me his name unsure of the date [name] place Eyes: Conjunctivae are normal Head: Atraumatic HEENT: No congestion/rhinnorhea. Mucous membranes are moist.  Oropharynx non-erythematous, dried black effluvianoted on his tongue  neck:   Nontender with no meningismus, no masses, no stridor Cardiovascular: Normal rate, regular rhythm. Grossly normal heart sounds.  Good peripheral circulation. Respiratory: Normal respiratory effort.  No retractions. Lungs CTAB. Abdominal: Soft [name] nontender. No distention. No guarding no rebound Back:  There is no focal tenderness or step off.  there is no midline tenderness  there are no lesions noted. there is no CVA tenderness Exam: Guaiac negative brown stool Musculoskeletal: No lower extremity tenderness, no upper extremity tenderness. No joint effusions, no DVT signs strong distal pulses no edema Neurologic:No gross focal neurologic deficits are appreciated.  Skin:  Skin is warm, dry [name] intact.  Several decubitus ulcers noted Psychiatric: Mood [name] affect are normal. Speech [name] behavior are normal.  ____________________________________________   LABS (all labs ordered are listed, but only abnormal results are displayed)  Labs Reviewed  COMPREHENSIVE METABOLIC PANEL - Abnormal; Notable for the following components:      Result Value   Glucose, Bld  158 (*)    BUN 35 (*)    Total Protein 5.1 (*)    Albumin 3.2 (*)    Total Bilirubin 1.8 (*)    All other components within normal limits  CBC - Abnormal; Notable for the following components:   WBC 13.0 (*)    RBC 2.72 (*)    Hemoglobin 10.2 (*)    HCT 28.3 (*)    MCV 104.2 (*)    MCH 37.6 (*)    MCHC 36.1 (*)    Platelets 139 (*)    All other components within normal limits  PROTIME-INR  POC OCCULT BLOOD, ED  TYPE [name] SCREEN  TYPE [name] SCREEN    Pertinent labs  results that were available during my care of the patient were reviewed by me [name] considered in my medical decision making (see chart for details). ____________________________________________  EKG  I personally interpreted [name] EKGs ordered by me or triage  ____________________________________________  RADIOLOGY  Pertinent labs & imaging results that were available during my care of the patient were reviewed by me [name] considered in my medical decision making (see chart for details). Martin possible, patient [name]/or family made aware of [name] abnormal findings.  No results found. ____________________________________________    PROCEDURES  Procedure(s) performed: None  Procedures  Critical Care performed:  None  ____________________________________________   INITIAL IMPRESSION / ASSESSMENT [name] PLAN / ED COURSE  Pertinent labs & imaging results that were available during my care of the patient were reviewed by me [name] considered in my medical decision making (see chart for details).  Seen here with black vomiting, unclear of bloody or not not actively vomiting here abdomen benign, will check blood work [name] reassess.    ____________________________________________   FINAL CLINICAL IMPRESSION(S) / ED DIAGNOSES  Final diagnoses:  Vomiting      This chart was dictated using voice recognition software.  Despite best efforts to proofread,  errors can occur which can change meaning.   3   Schuyler Amor, MD 06/03/18 [id number]

## 2018-06-03 NOTE — ED Triage Notes (Signed)
Pt arrives [facility] from [facility] for coffee ground emesis since yesterday. Hx CA, DM . VSS, CBG 151. Pt arrives pale in color. No diaphoresis noted. Shallow breathing noted. Full code.

## 2018-06-03 NOTE — Progress Notes (Signed)
Vonda Antigua, MD [address] [place], [place], [place], [address] [address], [place], [place], [place], [address] Phone: [phone]  Fax: [phone]  Consultation  Referring Provider:     Dr. Darvin Neighbours Primary Care Physician:  Valerie Roys, DO Reason for Consultation:     Hematemesis  Date of Admission:  06/03/2018 Date of Consultation:  06/03/2018         HPI:   MARSHAWN NORMOYLE is a 77 y.o. male with history of dementia, unable to provide any history, history obtained from patient's chart, is no family available at bedside.  Reportedly brought from nursing home due to coffee-ground emesis.  Reportedly had 2 episodes of coffee-ground emesis at nursing home, but no emesis since presentation.  No prior colonoscopy or EGD or GI visits available in his chart, including care everywhere. No report of hematochezia  Hemoglobin lowest at 9.7 since presentation.  This is around his baseline.  Past Medical History:  Diagnosis Date  . Arthritis   . 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 Diabetes mellitus   . Dyspnea   . 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 Hypertension   . Hypothyroidism   . Neuropathy   . Peripheral vascular disease ([facility])   . Venous stasis     Past Surgical History:  Procedure Laterality Date  . CATARACT EXTRACTION W/PHACO Right 10/25/2016   Procedure: CATARACT EXTRACTION PHACO AND INTRAOCULAR LENS PLACEMENT (IOC)  right eye complicated;  Surgeon: Ronnell Freshwater, MD;  Location: [facility];  Service: Ophthalmology;  Laterality: Right;  Right eye Diabetic - oral meds Vision blue  . FINGER SURGERY      Prior to Admission medications   Medication Sig Start Date End Date Taking? Authorizing Provider  ferrous sulfate 325 (65 FE) MG tablet Take 1 tablet (325 mg total) by mouth 2 (two) times daily with a meal. 05/21/18  Yes Salary, Montell D, MD  folic acid (FOLVITE) 1 MG tablet Take 1 tablet (1  mg total) by mouth daily. 05/21/18  Yes Salary, Montell D, MD  ipratropium-albuterol (DUONEB) 0.5-2.5 (3) MG/3ML SOLN Take 3 mLs by nebulization every 4 (four) hours as needed (shortness of breath or wheezing). 06/02/18 06/16/18 Yes [provider]  levothyroxine (SYNTHROID, LEVOTHROID) 75 MCG tablet Take 1 tablet (75 mcg total) by mouth daily before breakfast. 05/21/18  Yes Salary, Montell D, MD  LORazepam (ATIVAN) 2 MG/ML concentrated solution Take 1 mg by mouth every 4 (four) hours as needed for anxiety. 05/25/18 06/08/18 Yes [provider]  megestrol (MEGACE) 40 MG tablet Take 1 tablet (40 mg total) by mouth 2 (two) times daily. 05/21/18  Yes Salary, Avel Peace, MD  mirtazapine (REMERON) 15 MG tablet Take 1 tablet (15 mg total) by mouth at bedtime. 05/21/18 08/19/18 Yes Salary, Avel Peace, MD  potassium chloride SA (K-DUR,KLOR-CON) 20 MEQ tablet Take 1 tablet (20 mEq total) by mouth 2 (two) times daily. 05/09/18  Yes Jacquelin Hawking, NP  tamsulosin (FLOMAX) 0.4 MG CAPS capsule Take 1 capsule (0.4 mg total) by mouth daily after breakfast. 05/21/18  Yes Salary, Montell D, MD  thiamine (VITAMIN B-1) 100 MG tablet Take 1 tablet (100 mg total) by mouth daily. 05/21/18  Yes Salary, Avel Peace, MD  vitamin C (VITAMIN C) 250 MG tablet Take 1 tablet (250 mg total) by mouth 2 (two) times daily. 05/21/18  Yes Salary, Avel Peace, MD  Hydrocortisone (  GERHARDT'S BUTT CREAM) CREA Apply 1 application topically 4 (four) times daily. Patient not taking: Reported on 06/03/2018 05/21/18   Salary, Avel Peace, MD  levofloxacin (LEVAQUIN) 750 MG tablet Take 1 tablet (750 mg total) by mouth daily. Patient not taking: Reported on 06/03/2018 05/22/18   Salary, Avel Peace, MD  Nutritional Supplements (ENSURE ACTIVE HIGH PROTEIN) LIQD Take 1 Can by mouth 3 (three) times daily with meals. Patient not taking: Reported on 06/03/2018 05/21/18   Salary, Avel Peace, MD    Family History  Problem Relation Age of Onset  . Heart  disease Mother   . Alzheimer's disease Sister   . Heart disease Maternal Aunt      Social History   Tobacco Use  . Smoking status: Former Smoker    Packs/day: 0.25    Types: Cigarettes    Last attempt to quit: 03/23/2015    Years since quitting: 3.2  . Smokeless tobacco: Current User    Types: Chew  Substance Use Topics  . Alcohol use: Yes    Alcohol/week: 42.0 standard drinks    Types: 42 Cans of beer per week    Comment: 5-6 every day  . Drug use: No    Allergies as of 06/03/2018 - Review Complete 06/03/2018  Allergen Reaction Noted  . Metformin and related Nausea And Vomiting 06/30/2015    Review of Systems:    All systems reviewed and negative except where noted in HPI.   Physical Exam:  Vital signs in last 24 hours: Vitals:   06/03/18 0930 06/03/18 1000 06/03/18 1049 06/03/18 2008  BP: (!) 148/53 (!) 129/95 (!) 125/58   Pulse: (!) 107 96 96 (!) 104  Resp: 16 17  16   Temp:   98.5 F (36.9 C) 98.2 F (36.8 C)  TempSrc:   Oral Oral  SpO2: 96% 97% 96% 97%  Weight:      Height:       Last BM Date: 06/03/18 General:   Pleasant, cooperative in NAD Head:  Normocephalic and atraumatic. Eyes:   No icterus.   Conjunctiva pink. PERRLA. Ears:  Normal auditory acuity. Neck:  Supple; no masses or thyroidomegaly Lungs: Respirations even and unlabored. Lungs clear to auscultation bilaterally.   No wheezes, crackles, or rhonchi.  Abdomen:  Soft, nondistended, nontender. Normal bowel sounds. No appreciable masses or hepatomegaly.  No rebound or guarding.  Neurologic:  grossly normal neurologically. Skin:  Intact without significant lesions or rashes. Cervical Nodes:  No significant cervical adenopathy. Psych:  Alert and cooperative. Normal affect.  LAB RESULTS: Recent Labs    06/03/18 0813 06/03/18 1515  WBC 13.0*  --   HGB 10.2* 9.7*  HCT 28.3*  --   PLT 139*  --    BMET Recent Labs    06/03/18 0813  NA 145  K 4.1  CL 111  CO2 26  GLUCOSE 158*  BUN 35*    CREATININE 0.66  CALCIUM 9.5   LFT Recent Labs    06/03/18 0813  PROT 5.1*  ALBUMIN 3.2*  AST 31  ALT 28  ALKPHOS 124  BILITOT 1.8*   PT/INR Recent Labs    06/03/18 0813  LABPROT 16.0*  INR 1.29    STUDIES: Dg Chest Port 1 View  Result Date: 06/03/2018 CLINICAL DATA:  Vomiting EXAM: PORTABLE CHEST 1 VIEW COMPARISON:  05/18/2018 chest radiograph. FINDINGS: Stable cardiomediastinal silhouette with normal heart size. No pneumothorax. No pleural effusion. No pulmonary edema. Small vague density overlying the peripheral right mid lung  is stable. No acute consolidative airspace disease. IMPRESSION: No acute cardiopulmonary disease. Persistent small vague density overlying the peripheral right mid lung, unchanged since 05/18/2018 chest radiograph. Suggest short-term outpatient follow-up chest CT to exclude a pulmonary nodule in this location. Electronically Signed   By: Ilona Sorrel M.D.   On: 06/03/2018 09:04      Impression / Plan:   JAYLN MADEIRA is a 77 y.o. y/o male with sent from nursing home due to coffee-ground emesis, with history of dementia, with no signs of active GI bleeding since presentation  Source of coffee-ground emesis possible esophagitis Less likely to be peptic ulcer disease, given stable hemoglobin at baseline, and stable hemodynamics PPI IV twice daily  Continue serial CBCs and transfuse PRN Avoid NSAIDs Maintain 2 large-bore IV lines Please page GI with any acute hemodynamic changes, or signs of active GI bleeding  Can proceed with possible EGD tomorrow if family is agreeable and consents for the procedure N.p.o. past midnight If family does not want to consent for the procedures, and would like conservative management, can manage with PPI given that emesis has resolved since presentation, and hemoglobin is stable   Thank you for involving me in the care of this patient.      LOS: 0 days   Virgel Manifold, MD  06/03/2018, 10:37 PM

## 2018-06-03 NOTE — Progress Notes (Signed)
Spoke with Rip Harbour LPN from [facility] to answer pt profile questions

## 2018-06-03 NOTE — Clinical Social Work Note (Signed)
Clinical Social Work Assessment  Patient Details  Name: [name] MRN: [id number] Date of Birth: 12/13/1940  Date of referral:  06/03/18               Reason for consult:  Facility Placement                Permission sought to share information with:  Chartered certified accountant granted to share information::  Yes, Verbal Permission Granted  Name::        Agency::  [facility]  Relationship::     Contact Information:     Housing/Transportation Living arrangements for the past 2 months:  [place], [place] of Information:  Patient, Medical Team, Facility, Other (Comment Required)(Niece) Patient Interpreter Needed:  None Criminal Activity/Legal Involvement Pertinent to Current Situation/Hospitalization:  No - Comment as needed Significant Relationships:  Adult Children, Warehouse manager, Siblings, Other Family Members Lives with:  Facility Resident Do you feel safe going back to the place where you live?  Yes Need for family participation in patient care:  Yes (Comment)(Patient has dementia and is not fully alert)  Care giving concerns:  Patient admitted from [facility]   Social Worker assessment / plan:  The CSW met with the patient at bedside to discuss discharge planning. The patient was able to confirm that he has been at [facility] since 8/10, and he believes that he lives there. The patient was not oriented to time; however, he was oriented to place, basic situation, and self. The patient shared that he would like to return to the facility.  The patient is known to this CSW from his last admission. The CSW contacted the patient's niece [name] to ensure she is aware of the admission. [name] agreed with the discharge plan back to [facility] once the patient is stable, and she thanked the CSW for contact.   Claiborne Billings at [facility] confirmed that the patient can return when stable  pending [place]. The patient will most likely discharge Monday pending stability. He will need EMS for transport. CSW will follow for discharge facilitation.  Employment status:  Retired Nurse, adult, Medicaid In [place] PT Recommendations:  Not assessed at this time [place] / Referral to community resources:  [place]  Patient/Family's Response to care: The patient and his niece thanked the CSW.  Patient/Family's Understanding of and Emotional Response to Diagnosis, Current Treatment, and Prognosis: The patient and his family are in agreement with return to his facility of origin when medically stable.  Emotional Assessment Appearance:  Appears stated age Attitude/Demeanor/Rapport:  Lethargic, Apprehensive Affect (typically observed):  Apprehensive, Pleasant, Restless Orientation:  Oriented to Self, Oriented to Place, Oriented to Situation Alcohol / Substance use:  Never Used Psych involvement (Current and /or in the community):  No (Comment)  Discharge Needs  Concerns to be addressed:  Discharge Planning Concerns Readmission within the last 30 days:  Yes Current discharge risk:  Chronically ill, Cognitively Impaired Barriers to Discharge:  Continued Medical Work up   [employer], LCSW 06/03/2018, 2:50 PM

## 2018-06-03 NOTE — ED Notes (Signed)
Straight stuck pt L AC for a resend type and screen to lab at this time.

## 2018-06-03 NOTE — Clinical Social Work Note (Signed)
CSW received consult that patient admitted from [facility]. CSW will assess when able.  Santiago Bumpers, MSW, Latanya Presser [phone]

## 2018-06-04 ENCOUNTER — Encounter
Admission: EM | Disposition: A | Discharge status: Skilled Nursing Facility | Payer: Self-pay | Source: Home / Self Care | Attending: Internal Medicine

## 2018-06-04 DIAGNOSIS — Z820 Family history of epilepsy and other diseases of the nervous system: Secondary | ICD-10-CM | POA: Diagnosis not present

## 2018-06-04 DIAGNOSIS — D649 Anemia, unspecified: Secondary | ICD-10-CM | POA: Diagnosis not present

## 2018-06-04 DIAGNOSIS — E1151 Type 2 diabetes mellitus with diabetic peripheral angiopathy without gangrene: Secondary | ICD-10-CM | POA: Diagnosis not present

## 2018-06-04 DIAGNOSIS — G9340 Encephalopathy, unspecified: Secondary | ICD-10-CM | POA: Diagnosis not present

## 2018-06-04 DIAGNOSIS — E1140 Type 2 diabetes mellitus with diabetic neuropathy, unspecified: Secondary | ICD-10-CM | POA: Diagnosis not present

## 2018-06-04 DIAGNOSIS — E119 Type 2 diabetes mellitus without complications: Secondary | ICD-10-CM | POA: Diagnosis not present

## 2018-06-04 DIAGNOSIS — E1122 Type 2 diabetes mellitus with diabetic chronic kidney disease: Secondary | ICD-10-CM | POA: Diagnosis not present

## 2018-06-04 DIAGNOSIS — Z8249 Family history of ischemic heart disease and other diseases of the circulatory system: Secondary | ICD-10-CM | POA: Diagnosis not present

## 2018-06-04 DIAGNOSIS — E46 Unspecified protein-calorie malnutrition: Secondary | ICD-10-CM | POA: Diagnosis not present

## 2018-06-04 DIAGNOSIS — M1990 Unspecified osteoarthritis, unspecified site: Secondary | ICD-10-CM | POA: Diagnosis not present

## 2018-06-04 DIAGNOSIS — Z79899 Other long term (current) drug therapy: Secondary | ICD-10-CM | POA: Diagnosis not present

## 2018-06-04 DIAGNOSIS — C9000 Multiple myeloma not having achieved remission: Secondary | ICD-10-CM | POA: Diagnosis not present

## 2018-06-04 DIAGNOSIS — I129 Hypertensive chronic kidney disease with stage 1 through stage 4 chronic kidney disease, or unspecified chronic kidney disease: Secondary | ICD-10-CM | POA: Diagnosis not present

## 2018-06-04 DIAGNOSIS — K920 Hematemesis: Secondary | ICD-10-CM | POA: Diagnosis not present

## 2018-06-04 DIAGNOSIS — Z888 Allergy status to other drugs, medicaments and biological substances status: Secondary | ICD-10-CM | POA: Diagnosis not present

## 2018-06-04 DIAGNOSIS — K922 Gastrointestinal hemorrhage, unspecified: Secondary | ICD-10-CM | POA: Diagnosis not present

## 2018-06-04 DIAGNOSIS — Z66 Do not resuscitate: Secondary | ICD-10-CM | POA: Diagnosis not present

## 2018-06-04 DIAGNOSIS — E039 Hypothyroidism, unspecified: Secondary | ICD-10-CM | POA: Diagnosis not present

## 2018-06-04 DIAGNOSIS — E538 Deficiency of other specified B group vitamins: Secondary | ICD-10-CM | POA: Diagnosis not present

## 2018-06-04 DIAGNOSIS — N181 Chronic kidney disease, stage 1: Secondary | ICD-10-CM | POA: Diagnosis not present

## 2018-06-04 LAB — GLUCOSE, CAPILLARY
GLUCOSE-CAPILLARY: 114 mg/dL — AB (ref 70–99)
GLUCOSE-CAPILLARY: 116 mg/dL — AB (ref 70–99)
GLUCOSE-CAPILLARY: 106 mg/dL — AB (ref 70–99)
Glucose-Capillary: 107 mg/dL — ABNORMAL HIGH (ref 70–99)
Glucose-Capillary: 116 mg/dL — ABNORMAL HIGH (ref 70–99)

## 2018-06-04 LAB — BASIC METABOLIC PANEL
Anion gap: 6 (ref 5–15)
BUN: 30 mg/dL — AB (ref 8–23)
CALCIUM: 9.3 mg/dL (ref 8.9–10.3)
CHLORIDE: 117 mmol/L — AB (ref 98–111)
CO2: 25 mmol/L (ref 22–32)
CREATININE: 0.72 mg/dL (ref 0.61–1.24)
GFR calc non Af Amer: >60 mL/min (ref >60)
Glucose, Bld: 121 mg/dL — ABNORMAL HIGH (ref 70–99)
Potassium: 3.9 mmol/L (ref 3.5–5.1)
Sodium: 148 mmol/L — ABNORMAL HIGH (ref 135–145)

## 2018-06-04 LAB — CBC
HCT: 27.2 % — ABNORMAL LOW (ref 40.0–52.0)
Hemoglobin: 9.6 g/dL — ABNORMAL LOW (ref 13.0–18.0)
MCH: 37.4 pg — AB (ref 26.0–34.0)
MCHC: 35.4 g/dL (ref 32.0–36.0)
MCV: 105.5 fL — AB (ref 80.0–100.0)
PLATELETS: 115 10*3/uL — AB (ref 150–440)
RBC: 2.58 MIL/uL — AB (ref 4.40–5.90)
RDW: 14.3 % (ref 11.5–14.5)
WBC: 6.9 10*3/uL (ref 3.8–10.6)

## 2018-06-04 SURGERY — EGD (ESOPHAGOGASTRODUODENOSCOPY)
Anesthesia: Monitor Anesthesia Care | Laterality: Left

## 2018-06-04 SURGERY — ESOPHAGOGASTRODUODENOSCOPY (EGD) WITH PROPOFOL
Anesthesia: General

## 2018-06-04 MED ORDER — POTASSIUM CHLORIDE IN NACL 20-0.45 MEQ/L-% IV SOLN
INTRAVENOUS | Status: DC
  Administered 2018-06-04 – 2018-06-06 (×5): via INTRAVENOUS
  Filled 2018-06-04 (×7): qty 1000

## 2018-06-04 MED ORDER — FAMOTIDINE IN NACL 20-0.9 MG/50ML-% IV SOLN
20.0000 mg | Freq: Two times a day (BID) | INTRAVENOUS | Status: DC
  Administered 2018-06-04 – 2018-06-06 (×6): 20 mg via INTRAVENOUS
  Filled 2018-06-04 (×6): qty 50

## 2018-06-04 NOTE — Progress Notes (Signed)
Martin Antigua, MD [address] [place], [place], [place], [address] [address], [place], [place], [place], [address] Phone: [phone]  Fax: [phone]   Subjective: Patient laying in bed comfortably.  No episodes of emesis since presentation.  No episodes of GI bleeding since presentation.   Objective: Exam: Vital signs in last 24 hours: Vitals:   06/03/18 2008 06/04/18 0022 06/04/18 0430 06/04/18 0500  BP:  (!) 150/58 (!) 144/61   Pulse: (!) 104 99 (!) 102   Resp: 16  (!) 24   Temp: 98.2 F (36.8 C)  98.1 F (36.7 C)   TempSrc: Oral  Oral   SpO2: 97%  97%   Weight:    57 kg  Height:       Weight change:   Intake/Output Summary (Last 24 hours) at 06/04/2018 1333 Last data filed at 06/04/2018 0330 Gross per 24 hour  Intake 383.18 ml  Output -  Net 383.18 ml    General: No acute distress, AAO x3 Abd: Soft, NT/ND, No HSM Skin: Warm, no rashes Neck: Supple, Trachea midline   Lab Results: Lab Results  Component Value Date   WBC 6.9 06/04/2018   HGB 9.6 (L) 06/04/2018   HCT 27.2 (L) 06/04/2018   MCV 105.5 (H) 06/04/2018   PLT 115 (L) 06/04/2018   Micro Results: Recent Results (from the past 240 hour(s))  MRSA PCR Screening     Status: Abnormal   Collection Time: 06/03/18 10:49 AM  Result Value Ref Range Status   MRSA by PCR POSITIVE (A) NEGATIVE Final    Comment:        The GeneXpert MRSA Assay (FDA approved for NASAL specimens only), is one component of a comprehensive MRSA colonization surveillance program. It is not intended to diagnose MRSA infection nor to guide or monitor treatment for MRSA infections. RESULT CALLED TO, READ BACK BY AND VERIFIED WITH: [place] 06/03/18 @ 27  [place] Performed at [facility], [address] [place], [place] [address]    Studies/Results: Dg Chest Port 1 View  Result Date: 06/03/2018 CLINICAL DATA:  Vomiting EXAM: PORTABLE CHEST 1 VIEW COMPARISON:  05/18/2018 chest radiograph.  FINDINGS: Stable cardiomediastinal silhouette with normal heart size. No pneumothorax. No pleural effusion. No pulmonary edema. Small vague density overlying the peripheral right mid lung is stable. No acute consolidative airspace disease. IMPRESSION: No acute cardiopulmonary disease. Persistent small vague density overlying the peripheral right mid lung, unchanged since 05/18/2018 chest radiograph. Suggest short-term outpatient follow-up chest CT to exclude a pulmonary nodule in this location. Electronically Signed   By: Ilona Sorrel M.D.   On: 06/03/2018 09:04   Medications:  Scheduled Meds: . Chlorhexidine Gluconate Cloth  6 each Topical Q0600  . insulin aspart  0-9 Units Subcutaneous Q4H  . mouth rinse  15 mL Mouth Rinse BID  . mupirocin ointment  1 application Nasal BID   Continuous Infusions: . 0.45 % NaCl with KCl 20 mEq / L    . famotidine (PEPCID) IV     PRN Meds:.acetaminophen **OR** acetaminophen, albuterol, ondansetron **OR** ondansetron (ZOFRAN) IV, polyethylene glycol   Assessment: 77 year old male sent from nursing home due to coffee-ground emesis  Plan: Coffee-ground emesis is not specific for GI bleed Patient's hemoglobin remained at baseline throughout presentation Therefore, coffee-ground emesis could have been due to other reasons besides GI bleed  I discussed EGD with patient son, as patient is unable to provide consent Patient son is unsure if patient would want an EGD  done I discussed the option of conservative management given that he has not had any active GI bleeding since presentation and hemoglobin has stayed at baseline  Son would like to think about it, and thinks that when patient's mental status is better, he can decide at that time as well  Would recommend continuing PPI, can change to oral, twice daily to treat any underlying esophagitis or peptic ulcer disease Although peptic ulcer disease is less likely given hemodynamic stability, no drop in  hemoglobin from baseline, and no evidence of GI bleeding since presentation Continue Serial CBCs Avoid NSAIDs  For history of chronic anemia, patient should follow-up with primary care and GI as an outpatient for discussing further work-up  Would recommend social work consult/palliative care consult to establish goals of care in this patient with dementia, and to help family with her medical decisions as well.  Dr. Allen Norris will be on service starting tomorrow, and will be following the patient.   LOS: 1 day   Martin Antigua, MD 06/04/2018, 1:33 PM

## 2018-06-04 NOTE — Progress Notes (Signed)
Initial Nutrition Assessment  DOCUMENTATION CODES:   Severe malnutrition in context of chronic illness  INTERVENTION:  Will monitor for diet advancement.  When diet advanced recommend: -Ensure Enlive po TID, each supplement provides 350 kcal and 20 grams of protein -Magic cup TID with meals, each supplement provides 290 kcal and 9 grams of protein -MVI daily -Ocuvite daily for wound healing -Vitamin C 250 mg BID  NUTRITION DIAGNOSIS:   Severe Malnutrition related to chronic illness(hx multiple myeloma, dementia) as evidenced by severe fat depletion, severe muscle depletion.  GOAL:   Patient will meet greater than or equal to 90% of their needs  MONITOR:   PO intake, Supplement acceptance, Diet advancement, Labs, Weight trends, Skin, I & O's  REASON FOR ASSESSMENT:   Malnutrition Screening Tool    ASSESSMENT:   77 year old male with PMHx of dementia, multiple myeloma, HTN, hypothyroidism, venous stasis, PVD, neuropathy, arthritis admitted from [facility] with coffee-ground emesis.   -Pending PMT consult.  Met with patient at bedside. He was awake in bed but unable to provide any history. No family members present at time of RD assessment. Noted on NFPE patient is edentulous. No dentures present in room but unsure if there might be some that got left at [facility] when he was admitted. Per chart sent over with patient from SNF he was most recently ordered for a regular diet with thin liquids on 05/30/2018. There is other documentation showing he is on a mechanical soft diet, so unsure which is correct. He is on Ensure Plus BID, Pro-Stat AWC TID, vitamin C 650 mg BID, folic acid 1 mg daily, thiamine 100 mg daily.  Per chart patient was 81.7 kg on 01/13/2018 and is currently 57 kg. He has lost 24.7 kg (30.2% body weight) over the past almost 5 months, which is significant for time frame.  Medications reviewed and include: Novolog 0-9 units Q4hrs, 1/2NS with KCl  20 mEq/L at 100 mL/hr, famotidine.  Labs reviewed: CBG 107-116, Sodium 148, Chloride 117, BUN 30.  NUTRITION - FOCUSED PHYSICAL EXAM:    Most Recent Value  Orbital Region  Severe depletion  Upper Arm Region  Severe depletion  Thoracic and Lumbar Region  Severe depletion  Buccal Region  Severe depletion  Temple Region  Severe depletion  Clavicle Bone Region  Severe depletion  Clavicle and Acromion Bone Region  Severe depletion  Scapular Bone Region  Severe depletion  Dorsal Hand  Severe depletion  Patellar Region  Severe depletion  Anterior Thigh Region  Severe depletion  Posterior Calf Region  Severe depletion  Edema (RD Assessment)  None  Hair  Reviewed  Eyes  Reviewed  Mouth  Reviewed [edentulous]  Skin  Reviewed [ecchymosis,  dry and flaky]  Nails  Reviewed     Diet Order:   Diet Order            Diet NPO time specified  Diet effective now              EDUCATION NEEDS:   Not appropriate for education at this time  Skin:  Skin Assessment: Skin Integrity Issues: Skin Integrity Issues:: Stage II Stage II: right thigh (2cm x 0.5cm); right buttocks (2cm x 0.5cm); left buttocks (2cm x 0.5cm)  Last BM:  06/04/2018 - small type 6  Height:   Ht Readings from Last 1 Encounters:  06/03/18 '5\' 7"'  (1.702 m)    Weight:   Wt Readings from Last 1 Encounters:  06/04/18 57 kg  Ideal Body Weight:  67.3 kg  BMI:  Body mass index is 19.68 kg/m.  Estimated Nutritional Needs:   Kcal:  1710-1880 (30-33 kcal/kg)  Protein:  85-95 grams (1.5-1.7 grams/kg)  Fluid:  1.5-1.7 L/day (25-30 mL/kg)  Willey Blade, MS, RD, LDN Office: [phone] Pager: [phone] After Hours/Weekend Pager: [phone]

## 2018-06-04 NOTE — Progress Notes (Signed)
[facility] at [facility] NAME: [name]    MR#:  [id number]  DATE OF BIRTH:  17-Sep-1941  SUBJECTIVE:  CHIEF COMPLAINT:   Chief Complaint  Patient presents with  . Hematemesis   Patient is more awake.  Unable to contribute to history.  Son at bedside.  REVIEW OF SYSTEMS:    Review of Systems  Unable to perform ROS: Dementia    DRUG ALLERGIES:   Allergies  Allergen Reactions  . Metformin And Related Nausea And Vomiting    VITALS:  Blood pressure (!) 144/61, pulse (!) 102, temperature 98.1 F (36.7 C), temperature source Oral, resp. rate (!) 24, height 5\' 7"  (1.702 m), weight 57 kg, SpO2 97 %.  PHYSICAL EXAMINATION:   Physical Exam  GENERAL:  77 y.o.-year-old patient lying in the bed with no acute distress.  EYES: Pupils equal, round, reactive to light and accommodation. No scleral icterus. Extraocular muscles intact.  HEENT: Head atraumatic, normocephalic. Oropharynx and nasopharynx clear.  NECK:  Supple, no jugular venous distention. No thyroid enlargement, no tenderness.  LUNGS: Normal breath sounds bilaterally, no wheezing, rales, rhonchi. No use of accessory muscles of respiration.  CARDIOVASCULAR: S1, S2 normal. No murmurs, rubs, or gallops.  ABDOMEN: Soft, nontender, nondistended. Bowel sounds present. No organomegaly or mass.  EXTREMITIES: No cyanosis, clubbing or edema b/l.   Diffuse muscle wasting NEUROLOGIC: Moves all 4 extremities.  Slurred speech PSYCHIATRIC: The patient is alert and awake SKIN: No obvious rash, lesion, or ulcer.   LABORATORY PANEL:   CBC Recent Labs  Lab 06/04/18 0450  WBC 6.9  HGB 9.6*  HCT 27.2*  PLT 115*   ------------------------------------------------------------------------------------------------------------------ Chemistries  Recent Labs  Lab 06/03/18 0813 06/04/18 0450  NA 145 148*  K 4.1 3.9  CL 111 117*  CO2 26 25  GLUCOSE 158* 121*  BUN 35* 30*  CREATININE 0.66 0.72   CALCIUM 9.5 9.3  AST 31  --   ALT 28  --   ALKPHOS 124  --   BILITOT 1.8*  --    ------------------------------------------------------------------------------------------------------------------  Cardiac Enzymes No results for input(s): TROPONINI in the last 168 hours. ------------------------------------------------------------------------------------------------------------------  RADIOLOGY:  Dg Chest Port 1 View  Result Date: 06/03/2018 CLINICAL DATA:  Vomiting EXAM: PORTABLE CHEST 1 VIEW COMPARISON:  05/18/2018 chest radiograph. FINDINGS: Stable cardiomediastinal silhouette with normal heart size. No pneumothorax. No pleural effusion. No pulmonary edema. Small vague density overlying the peripheral right mid lung is stable. No acute consolidative airspace disease. IMPRESSION: No acute cardiopulmonary disease. Persistent small vague density overlying the peripheral right mid lung, unchanged since 05/18/2018 chest radiograph. Suggest short-term outpatient follow-up chest CT to exclude a pulmonary nodule in this location. Electronically Signed   By: Ilona Sorrel M.D.   On: 06/03/2018 09:04     ASSESSMENT AND PLAN:   *Upper GI bleed.   Hemoglobin stable today.  No further hematemesis. Appreciate GI input.  Waiting for EGD. We will start Protonix drip.  Twice daily dosing. Repeat hemoglobin in the morning Start IV fluids  *Dementia with encephalopathy.  This has been a progressive problem.  Patient was worked up extensively during recent admission with MRI of the brain which was normal.  EEG showed nothing acute.  Likely has progressively worsening dementia.  *Diabetes mellitus.  N.p.o.  Every 4 Accu-Cheks.  *Hypertension.  Hold medications.  DVT prophylaxis with SCDs  All the records are reviewed and case discussed with Care Management/Social Worker Management plans discussed with the  patient, family and they are in agreement.  CODE STATUS: FULL CODE  DVT Prophylaxis:  SCDs  TOTAL TIME TAKING CARE OF THIS PATIENT: 40 minutes.   POSSIBLE D/C IN 1-2 DAYS, DEPENDING ON CLINICAL CONDITION.  Leia Alf Anadia Helmes M.D on 06/04/2018 at 12:32 PM  Between 7am to 6pm - Pager - [phone]  After 6pm go to [email or web address] - password [email or web address] [facility]  Office  [phone]  CC: Primary care physician; Valerie Roys, DO  Note: This dictation was prepared with Dragon dictation along with smaller phrase technology. Any transcriptional errors that result from this process are unintentional.

## 2018-06-05 DIAGNOSIS — E538 Deficiency of other specified B group vitamins: Secondary | ICD-10-CM | POA: Diagnosis not present

## 2018-06-05 DIAGNOSIS — C9000 Multiple myeloma not having achieved remission: Secondary | ICD-10-CM | POA: Diagnosis not present

## 2018-06-05 DIAGNOSIS — K920 Hematemesis: Secondary | ICD-10-CM | POA: Diagnosis not present

## 2018-06-05 DIAGNOSIS — Z8249 Family history of ischemic heart disease and other diseases of the circulatory system: Secondary | ICD-10-CM | POA: Diagnosis not present

## 2018-06-05 DIAGNOSIS — Z66 Do not resuscitate: Secondary | ICD-10-CM | POA: Diagnosis not present

## 2018-06-05 DIAGNOSIS — E1151 Type 2 diabetes mellitus with diabetic peripheral angiopathy without gangrene: Secondary | ICD-10-CM | POA: Diagnosis not present

## 2018-06-05 DIAGNOSIS — E1122 Type 2 diabetes mellitus with diabetic chronic kidney disease: Secondary | ICD-10-CM | POA: Diagnosis not present

## 2018-06-05 DIAGNOSIS — Z888 Allergy status to other drugs, medicaments and biological substances status: Secondary | ICD-10-CM | POA: Diagnosis not present

## 2018-06-05 DIAGNOSIS — E46 Unspecified protein-calorie malnutrition: Secondary | ICD-10-CM | POA: Diagnosis not present

## 2018-06-05 DIAGNOSIS — E1140 Type 2 diabetes mellitus with diabetic neuropathy, unspecified: Secondary | ICD-10-CM | POA: Diagnosis not present

## 2018-06-05 DIAGNOSIS — G9340 Encephalopathy, unspecified: Secondary | ICD-10-CM | POA: Diagnosis not present

## 2018-06-05 DIAGNOSIS — E039 Hypothyroidism, unspecified: Secondary | ICD-10-CM | POA: Diagnosis not present

## 2018-06-05 DIAGNOSIS — K922 Gastrointestinal hemorrhage, unspecified: Secondary | ICD-10-CM | POA: Diagnosis not present

## 2018-06-05 DIAGNOSIS — E119 Type 2 diabetes mellitus without complications: Secondary | ICD-10-CM | POA: Diagnosis not present

## 2018-06-05 DIAGNOSIS — Z820 Family history of epilepsy and other diseases of the nervous system: Secondary | ICD-10-CM | POA: Diagnosis not present

## 2018-06-05 DIAGNOSIS — D649 Anemia, unspecified: Secondary | ICD-10-CM | POA: Diagnosis not present

## 2018-06-05 DIAGNOSIS — I129 Hypertensive chronic kidney disease with stage 1 through stage 4 chronic kidney disease, or unspecified chronic kidney disease: Secondary | ICD-10-CM | POA: Diagnosis not present

## 2018-06-05 DIAGNOSIS — M1990 Unspecified osteoarthritis, unspecified site: Secondary | ICD-10-CM | POA: Diagnosis not present

## 2018-06-05 DIAGNOSIS — N181 Chronic kidney disease, stage 1: Secondary | ICD-10-CM | POA: Diagnosis not present

## 2018-06-05 DIAGNOSIS — Z79899 Other long term (current) drug therapy: Secondary | ICD-10-CM | POA: Diagnosis not present

## 2018-06-05 LAB — BASIC METABOLIC PANEL
Anion gap: 5 (ref 5–15)
BUN: 27 mg/dL — ABNORMAL HIGH (ref 8–23)
CO2: 23 mmol/L (ref 22–32)
Calcium: 8.9 mg/dL (ref 8.9–10.3)
Chloride: 119 mmol/L — ABNORMAL HIGH (ref 98–111)
Creatinine, Ser: 0.62 mg/dL (ref 0.61–1.24)
Glucose, Bld: 107 mg/dL — ABNORMAL HIGH (ref 70–99)
POTASSIUM: 3.9 mmol/L (ref 3.5–5.1)
SODIUM: 147 mmol/L — AB (ref 135–145)

## 2018-06-05 LAB — GLUCOSE, CAPILLARY
GLUCOSE-CAPILLARY: 107 mg/dL — AB (ref 70–99)
GLUCOSE-CAPILLARY: 99 mg/dL (ref 70–99)
GLUCOSE-CAPILLARY: 106 mg/dL — AB (ref 70–99)
GLUCOSE-CAPILLARY: 111 mg/dL — AB (ref 70–99)
Glucose-Capillary: 95 mg/dL (ref 70–99)
Glucose-Capillary: 96 mg/dL (ref 70–99)

## 2018-06-05 LAB — HEMOGLOBIN: HEMOGLOBIN: 9.7 g/dL — AB (ref 13.0–18.0)

## 2018-06-05 MED ORDER — LORAZEPAM 2 MG/ML IJ SOLN: 0.5000 mg | Freq: Four times a day (QID) | INTRAMUSCULAR | Status: DC | PRN

## 2018-06-05 NOTE — Evaluation (Signed)
Clinical/Bedside Swallow Evaluation Patient Details  Name: [name] MRN: [id number] Date of Birth: 1941-01-28  Today's Date: 06/05/2018 Time: SLP Start Time (ACUTE ONLY): 1010 SLP Stop Time (ACUTE ONLY): 1045 SLP Time Calculation (min) (ACUTE ONLY): 35 min  Past Medical History:  Past Medical History:  Diagnosis Date  . Arthritis   . Cancer ([place])    multiple myaloma  . Diabetes mellitus   . Dyspnea   . 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 Hypertension   . Hypothyroidism   . Neuropathy   . Peripheral vascular disease ([place])   . Venous stasis    Past Surgical History:  Past Surgical History:  Procedure Laterality Date  . CATARACT EXTRACTION W/PHACO Right 10/25/2016   Procedure: CATARACT EXTRACTION PHACO AND INTRAOCULAR LENS PLACEMENT (IOC)  right eye complicated;  Surgeon: Ronnell Freshwater, MD;  Location: [facility];  Service: Ophthalmology;  Laterality: Right;  Right eye Diabetic - oral meds Vision blue  . FINGER SURGERY     HPI:  Per admitting H&P: [name]  is a 77 y.o. male with a known history of dementia, multiple myeloma, hypertension, neuropathy, peripheral vascular disease, diabetes mellitus presented to the emergency room from [place] home due to having coffee-ground/black emesis.  Here in the emergency room he has been found to be tachycardic and is being admitted for further work-up of upper GI bleed.  Not on any anticoagulants.  Patient with dementia unable to contribute to history.  History obtained from son at bedside.  Unfortunately son is not aware of much of his dad's medical history and seems to be poor historian himself.   Assessment / Plan / Recommendation Clinical Impression  Patient presents with s/s consistent with mild to moderate oropharyngeal dysphagia at bedside. Oral phase c/b mild to moderate oral transit delay and decreased oral prep abilities with puree and nectar  thick liquids. No oral residue noted. Patient with moderate to severe dried secretions on his tongue and peeling dried lips. Attempted oral care; however patient became very agitated and refused oral care. Patient inconsistently followed directions. Oral mech exam revealed gross lingual weakness and decreased coordination. Patient is endentulous, no dentures available in room. No s/s aspiration or laryngeal penetration observed with any consistency tested; however patient at increased risk for aspiration due to decreased oral control of bolus and decreased cognitive status (unaware of swallow initiation). Possible esophageal deficits, patient noted to belch a few times following several tsp's puree and sips nectar thick liquid. Laryngeal elevation appeared adequate and vocal quality remained clear throughout evaluation. Per RD notes, patient has suffered a severe weight loss over past 5 months, losing approximately 30% of his body weight. Notes suggest he may have been on a regular or mech soft diet at SNF. Recommend Dysphagia I diet with nectar thick liquids, SINGLE sips only, total assist with meals, ensure patient has swallowed before offering next bite or sip, alternate sips of nectar thick liquids with tsp puree, give meds crushed in puree, strict aspiration precautions. Recommend keep patient upright for 60 minutes after giving any PO's, due to increased aspiration risk due to suspected esophageal dysphagia. SLP to f/u with toleration of diet and offer trials of upgraded consistency as appropriate. SLP Visit Diagnosis: Dysphagia, oropharyngeal phase (R13.12)    Aspiration Risk  Moderate aspiration risk    Diet Recommendation Dysphagia 1 (Puree);Nectar-thick liquid   Liquid Administration via: Cup Medication Administration: Crushed with  puree Supervision: Full supervision/cueing for compensatory strategies Compensations: Minimize environmental distractions;Slow rate;Small sips/bites;Follow solids  with liquid Postural Changes: Seated upright at 90 degrees;Other (Comment)(Remain upright at least 60 minutes after PO intake)    Other  Recommendations Recommended Consults: Consider GI evaluation;Consider esophageal assessment Oral Care Recommendations: Oral care QID   Follow up Recommendations Skilled Nursing facility      Frequency and Duration    1 week       Prognosis Prognosis for Safe Diet Advancement: Fair Barriers to Reach Goals: Cognitive deficits      Swallow Study   General Date of Onset: 06/05/18 HPI: Per admitting H&P: [name]  is a 77 y.o. male with a known history of dementia, multiple myeloma, hypertension, neuropathy, peripheral vascular disease, diabetes mellitus presented to the emergency room from [place] home due to having coffee-ground/black emesis.  Here in the emergency room he has been found to be tachycardic and is being admitted for further work-up of upper GI bleed.  Not on any anticoagulants.  Patient with dementia unable to contribute to history.  History obtained from son at bedside.  Unfortunately son is not aware of much of his dad's medical history and seems to be poor historian himself. Type of Study: Bedside Swallow Evaluation Diet Prior to this Study: NPO Temperature Spikes Noted: No Respiratory Status: Room air History of Recent Intubation: No Behavior/Cognition: Alert;Confused;Agitated Oral Cavity Assessment: Dry;Dried secretions Oral Care Completed by SLP: Other (Comment)(Attempted, patient adamantly refused) Oral Cavity - Dentition: Edentulous Patient Positioning: Upright in bed Baseline Vocal Quality: Normal Volitional Cough: Cognitively unable to elicit Volitional Swallow: Unable to elicit    Oral/Motor/Sensory Function Overall Oral Motor/Sensory Function: Generalized oral weakness Facial ROM: Reduced right;Reduced left Facial Symmetry: Within Functional Limits Facial Strength: Reduced right;Reduced  left Lingual ROM: Reduced right;Reduced left Lingual Symmetry: Within Functional Limits Lingual Strength: Reduced Mandible: Impaired   Ice Chips Ice chips: Within functional limits Presentation: Spoon   Thin Liquid      Nectar Thick Nectar Thick Liquid: Within functional limits Presentation: Cup;Spoon   Honey Thick Honey Thick Liquid: Not tested   Puree Puree: Impaired Presentation: Spoon Oral Phase Impairments: Reduced lingual movement/coordination Oral Phase Functional Implications: Prolonged oral transit   Solid     Solid: Not tested      Brogan England, MA, CCC-SLP 06/05/2018,12:11 PM

## 2018-06-05 NOTE — Progress Notes (Signed)
PALLIATIVE NOTE   Consult and updates received from Dr. Darvin Neighbours. I spoke with patient's niece and son. Per family they are unable to be here today at the same time due to other obligations. There is no established HCPOA however, son would be nearest relative and primary decision maker. He also relies on the support from his aunt Hal Neer. Family has requested to meet tomorrow 8/27 @ 0900 for goals of care discussion.   Detailed note and recommendations to follow scheduled goals of care meeting on 8/27 @ 0900.  Thank you for your referral and allowing [place] to collaborate in the care of Mr. Knoop.   Alda Lea, NP-BC Palliative Medicine Team  Phone: [phone] Fax: [phone] Pager: [phone] Amion: [place]   NO CHARGE NOTE

## 2018-06-05 NOTE — Progress Notes (Signed)
[name] at [name] NAME: [name]    MR#:  [id number]  DATE OF BIRTH:  [birth year]  SUBJECTIVE:  CHIEF COMPLAINT:   Chief Complaint  Patient presents with  . Hematemesis   Patient is confused and drowsy. Sister and niece at bedside.  REVIEW OF SYSTEMS:    Review of Systems  Unable to perform ROS: Dementia    DRUG ALLERGIES:   Allergies  Allergen Reactions  . Metformin And Related Nausea And Vomiting    VITALS:  Blood pressure (!) 156/72, pulse 86, temperature 97.8 F (36.6 C), temperature source Oral, resp. rate 18, height 5\' 7"  (1.702 m), weight 55.5 kg, SpO2 99 %.  PHYSICAL EXAMINATION:   Physical Exam  GENERAL:  77 y.o.-year-old patient lying in the bed with no acute distress.  EYES: Pupils equal, round, reactive to light and accommodation. No scleral icterus. Extraocular muscles intact.  HEENT: Head atraumatic, normocephalic. Oropharynx and nasopharynx clear.  NECK:  Supple, no jugular venous distention. No thyroid enlargement, no tenderness.  LUNGS: Normal breath sounds bilaterally, no wheezing, rales, rhonchi. No use of accessory muscles of respiration.  CARDIOVASCULAR: S1, S2 normal. No murmurs, rubs, or gallops.  ABDOMEN: Soft, nontender, nondistended. Bowel sounds present. No organomegaly or mass.  EXTREMITIES: No cyanosis, clubbing or edema b/l.   Diffuse muscle wasting NEUROLOGIC: Moves all 4 extremities.   PSYCHIATRIC: The patient is drowsy SKIN: No obvious rash  LABORATORY PANEL:   CBC Recent Labs  Lab 06/04/18 0450 06/05/18 0411  WBC 6.9  --   HGB 9.6* 9.7*  HCT 27.2*  --   PLT 115*  --    ------------------------------------------------------------------------------------------------------------------ Chemistries  Recent Labs  Lab 06/03/18 0813  06/05/18 0411  NA 145   < > 147*  K 4.1   < > 3.9  CL 111   < > 119*  CO2 26   < > 23  GLUCOSE 158*   < > 107*  BUN 35*   < > 27*  CREATININE  0.66   < > 0.62  CALCIUM 9.5   < > 8.9  AST 31  --   --   ALT 28  --   --   ALKPHOS 124  --   --   BILITOT 1.8*  --   --    < > = values in this interval not displayed.   ------------------------------------------------------------------------------------------------------------------  Cardiac Enzymes No results for input(s): TROPONINI in the last 168 hours. ------------------------------------------------------------------------------------------------------------------  RADIOLOGY:  No results found.   ASSESSMENT AND PLAN:   *Upper GI bleed.   Hemoglobin stable today.  No further hematemesis Appreciate GI input.  No EGD planned Repeat hemoglobin in the morning Continue IV famotidine  *Dementia with encephalopathy.  This has been a progressive problem.  Patient was worked up extensively during recent admission with MRI of the brain which was normal.  EEG showed nothing acute.   Has progressively worsening dementia. Unable to make any medical decisions at this time  *Diabetes mellitus.   Sliding scale insulin  *Hypertension.  Hold medications.  DVT prophylaxis with SCDs  All the records are reviewed and case discussed with Care Management/Social Worker Management plans discussed with the patient, family and they are in agreement.  CODE STATUS: FULL CODE  DVT Prophylaxis: SCDs  TOTAL TIME TAKING CARE OF THIS PATIENT: 40 minutes.   POSSIBLE D/C IN 1-2 DAYS, DEPENDING ON CLINICAL CONDITION.  Neita Carp M.D on 06/05/2018 at 2:39 PM  Between 7am to 6pm - Pager - [phone]  After 6pm go to [email or web address] - password [email or web address] [facility]  Office  [phone]  CC: Primary care physician; Valerie Roys, DO  Note: This dictation was prepared with Dragon dictation along with smaller phrase technology. Any transcriptional errors that result from this process are unintentional.

## 2018-06-05 NOTE — Progress Notes (Signed)
  Lucilla Lame, MD [facility]   [address] [place], [place] [address] Phone: [phone] Fax : [phone]   Subjective: This patient was admitted with coffee-ground emesis from a nursing home.  The patient has a long history of dementia.  The patient has a stable hemoglobin that appears to be around baseline.  The patient has had no further signs of any emesis since admission. The patient is not able to give any history.    Objective: Vital signs in last 24 hours: Vitals:   06/04/18 1800 06/04/18 1952 06/05/18 0425 06/05/18 1445  BP: 123/84 (!) 151/52 (!) 156/72 (!) 120/95  Pulse: 99 73 86 83  Resp:  18 18 20   Temp: 97.9 F (36.6 C) 97.8 F (36.6 C) 97.8 F (36.6 C) (!) 97.5 F (36.4 C)  TempSrc: Oral Oral Oral Oral  SpO2: 98% 98% 99% 98%  Weight:   55.5 kg   Height:       Weight change: -3.039 kg  Intake/Output Summary (Last 24 hours) at 06/05/2018 1603 Last data filed at 06/05/2018 1551 Gross per 24 hour  Intake 2470.53 ml  Output -  Net 2470.53 ml     Exam: Heart:: Regular rate and rhythm, S1S2 present or without murmur or extra heart sounds Lungs: normal and clear to auscultation and percussion Abdomen: soft, nontender, normal bowel sounds   Lab Results: @LABTEST2 @ Micro Results: Recent Results (from the past 240 hour(s))  MRSA PCR Screening     Status: Abnormal   Collection Time: 06/03/18 10:49 AM  Result Value Ref Range Status   MRSA by PCR POSITIVE (A) NEGATIVE Final    Comment:        The GeneXpert MRSA Assay (FDA approved for NASAL specimens only), is one component of a comprehensive MRSA colonization surveillance program. It is not intended to diagnose MRSA infection nor to guide or monitor treatment for MRSA infections. RESULT CALLED TO, READ BACK BY AND VERIFIED WITH: [place] 06/03/18 @ 35  [place] Performed at [facility], [address] [place], [place] [address]    Studies/Results: No results found. Medications: I  have reviewed the patient's current medications. Scheduled Meds: . Chlorhexidine Gluconate Cloth  6 each Topical Q0600  . insulin aspart  0-9 Units Subcutaneous Q4H  . mouth rinse  15 mL Mouth Rinse BID  . mupirocin ointment  1 application Nasal BID   Continuous Infusions: . 0.45 % NaCl with KCl 20 mEq / L 100 mL/hr at 06/05/18 1551  . famotidine (PEPCID) IV Stopped (06/05/18 1111)   PRN Meds:.acetaminophen **OR** acetaminophen, albuterol, LORazepam, ondansetron **OR** ondansetron (ZOFRAN) IV, polyethylene glycol   Assessment: Active Problems:   UGIB (upper gastrointestinal bleed)    Plan: This patient was admitted with coffee-ground emesis and a history of dementia.  The patient has had no further signs of bleeding and has been stable from a GI point of view.  The patient has also had a stable hemoglobin.  No procedures are planned for at this time.  The patientshould follow-up as an outpatient. I will sign off.  Please call if any further GI concerns or questions.  We would like to thank you for the opportunity to participate in the care of [facility].     LOS: 2 days   Lucilla Lame 06/05/2018, 4:03 PM

## 2018-06-06 DIAGNOSIS — Z66 Do not resuscitate: Secondary | ICD-10-CM | POA: Diagnosis not present

## 2018-06-06 DIAGNOSIS — Z515 Encounter for palliative care: Secondary | ICD-10-CM

## 2018-06-06 DIAGNOSIS — Z7189 Other specified counseling: Secondary | ICD-10-CM | POA: Diagnosis not present

## 2018-06-06 DIAGNOSIS — E46 Unspecified protein-calorie malnutrition: Secondary | ICD-10-CM | POA: Diagnosis not present

## 2018-06-06 DIAGNOSIS — E1151 Type 2 diabetes mellitus with diabetic peripheral angiopathy without gangrene: Secondary | ICD-10-CM | POA: Diagnosis not present

## 2018-06-06 DIAGNOSIS — E1140 Type 2 diabetes mellitus with diabetic neuropathy, unspecified: Secondary | ICD-10-CM | POA: Diagnosis not present

## 2018-06-06 DIAGNOSIS — E119 Type 2 diabetes mellitus without complications: Secondary | ICD-10-CM | POA: Diagnosis not present

## 2018-06-06 DIAGNOSIS — I129 Hypertensive chronic kidney disease with stage 1 through stage 4 chronic kidney disease, or unspecified chronic kidney disease: Secondary | ICD-10-CM | POA: Diagnosis not present

## 2018-06-06 DIAGNOSIS — K920 Hematemesis: Secondary | ICD-10-CM | POA: Diagnosis not present

## 2018-06-06 DIAGNOSIS — K922 Gastrointestinal hemorrhage, unspecified: Secondary | ICD-10-CM | POA: Diagnosis not present

## 2018-06-06 DIAGNOSIS — E1122 Type 2 diabetes mellitus with diabetic chronic kidney disease: Secondary | ICD-10-CM | POA: Diagnosis not present

## 2018-06-06 DIAGNOSIS — Z820 Family history of epilepsy and other diseases of the nervous system: Secondary | ICD-10-CM | POA: Diagnosis not present

## 2018-06-06 DIAGNOSIS — Z888 Allergy status to other drugs, medicaments and biological substances status: Secondary | ICD-10-CM | POA: Diagnosis not present

## 2018-06-06 DIAGNOSIS — Z79899 Other long term (current) drug therapy: Secondary | ICD-10-CM | POA: Diagnosis not present

## 2018-06-06 DIAGNOSIS — M1990 Unspecified osteoarthritis, unspecified site: Secondary | ICD-10-CM | POA: Diagnosis not present

## 2018-06-06 DIAGNOSIS — D649 Anemia, unspecified: Secondary | ICD-10-CM | POA: Diagnosis not present

## 2018-06-06 DIAGNOSIS — E538 Deficiency of other specified B group vitamins: Secondary | ICD-10-CM | POA: Diagnosis not present

## 2018-06-06 DIAGNOSIS — N181 Chronic kidney disease, stage 1: Secondary | ICD-10-CM | POA: Diagnosis not present

## 2018-06-06 DIAGNOSIS — Z8249 Family history of ischemic heart disease and other diseases of the circulatory system: Secondary | ICD-10-CM | POA: Diagnosis not present

## 2018-06-06 DIAGNOSIS — E039 Hypothyroidism, unspecified: Secondary | ICD-10-CM | POA: Diagnosis not present

## 2018-06-06 DIAGNOSIS — C9000 Multiple myeloma not having achieved remission: Secondary | ICD-10-CM | POA: Diagnosis not present

## 2018-06-06 DIAGNOSIS — G9340 Encephalopathy, unspecified: Secondary | ICD-10-CM | POA: Diagnosis not present

## 2018-06-06 LAB — GLUCOSE, CAPILLARY
GLUCOSE-CAPILLARY: 117 mg/dL — AB (ref 70–99)
GLUCOSE-CAPILLARY: 135 mg/dL — AB (ref 70–99)
GLUCOSE-CAPILLARY: 140 mg/dL — AB (ref 70–99)
GLUCOSE-CAPILLARY: 83 mg/dL (ref 70–99)
GLUCOSE-CAPILLARY: 86 mg/dL (ref 70–99)
GLUCOSE-CAPILLARY: 91 mg/dL (ref 70–99)
Glucose-Capillary: 90 mg/dL (ref 70–99)

## 2018-06-06 LAB — HEMOGLOBIN: Hemoglobin: 9.3 g/dL — ABNORMAL LOW (ref 13.0–18.0)

## 2018-06-06 MED ORDER — FAMOTIDINE 40 MG PO TABS: 40.0000 mg | ORAL_TABLET | Freq: Every evening | ORAL | 1 refills | Status: DC

## 2018-06-06 NOTE — Discharge Instructions (Signed)
Dysphagia 1 diet with nectar thickened liquids

## 2018-06-06 NOTE — Consult Note (Signed)
Consultation Note Date: 06/06/2018   Patient Name: [name]  DOB: 07/04/1941  MRN: [id number]  Age / Sex: 77 y.o., male  PCP: Martin Roys, DO Referring Physician: Hillary Bow, MD  Reason for Consultation: Establishing goals of care  HPI/Patient Profile: 77 y.o. male admitted on 06/03/2018 from [name] with complaints of coffee-ground/black emesis. He has a past medical history of dementia, multiple myeloma, hypertension, neuropathy, PVD, and diabetes. During his ED course he was found to be tachycardic. Patient has history of dementia and unable to provide history. His son was at the bedside but did not seem to be able to provide much information either. Patient received 532m NS bolus. Hemoglobin 9.7. WBC 6.9. BUN 35. Since admission patient continues to be confused. He is being seen by Gastroenterology with no plans to proceed with EGD, he has been started on Protonix drip for GI support. Palliative Medicine team consulted for goals of care discussion.   Clinical Assessment and Goals of Care: I have reviewed medical records including lab results, imaging, Epic notes, and MAR, received report from the bedside RN, and assessed the patient. I then met at the bedside with patient's son [name] patient's sister, [name] and his niece Martin Panningto discuss diagnosis prognosis, GOC, EOL wishes, disposition and options. Patient is more alert today. He denies pain or discomfort. He is eating breakfast independently. He is able to engage in conversation per family, however they are aware given his history of dementia their input will be more appropriate.   I re-introduced Palliative Medicine as specialized medical care for people living with serious illness. It focuses on providing relief from the symptoms and stress of a serious illness. The goal is to improve quality of life for both the patient and the  family. This patient is well known to our service. Our team was involved in his care during his previous admission.   As far as functional and nutritional status family reports that patient was participating in rehab at [name] Prior to admission to SNF for rehab patient was living at home with his son, [name] Martin Doryunfortunately was providing the best care but also has some developmental delays himself and relies on [name], his aunt for assistance. Martin Panningreports that she is aware that patient continues to have a cycle of doing good and then declining. [name] verbalizes that he remains hopeful that his father will continue to show signs of improvement and one day return back home. Niece verbalizes she remains hopeful for the best also but is also prepared for the worst.   We discussed his current illness and what it means in the larger context of his on-going co-morbidities.  Natural disease trajectory and expectations at EOL were discussed. Family verbalized their awareness of his progressive dementia and that they are not sure patient would tolerate further procedures or work ups every time he gets sick.   I attempted to elicit values and goals of care important to the patient and family.    The  difference between aggressive medical intervention and comfort care was considered in light of the patient's goals of care. Family has requested to continue to treat the treatable while hospitalized. Their goal is for him to return to [name] to show if he is to gain any improvement of strength. Both [name] and [name] verbalizes if he continues to decline their goal will be to make sure that he is kept comfortable.   We spent a great deal of time discussing advanced directives. [name] and [name] verbalized that they had been in conversation previously about [name] wishes. We discussed his current code status and what that could entail given his current condition and  co-morbidities in the event he had an cardiac or respiratory event. Patient actually verbalized during our conversation his wishes. He stated "[name] is my Media planner and the only Media planner. He knows what it is that I want. If yall put me on life support or let them crack my ribs I will haunt you. You better just let me die if I die. I don't need all that stuff done to me!" Both [name] and [name] verbalized that patient had made these similar comments to them previously. I asked [name] how did he feel in regards to his father's statements and about any previous conversations. He verbalized his agreement for patient to be a DNR/DNI. [name] was able to verbalize to me that he understood that no life-sustaining measures such as CPR or intubation would take place in the event of an emergent event. I explained that orders would be placed, an out of facility form would be completed, and that nursing staff would place a purple bracelet on patient to identify his wishes. [name], the patient, and [name] all verbalized understanding.   Hospice and Palliative Care services outpatient were explained and offered. At this time family is requesting for patient to return to SNF with outpatient palliative. They verbalized awareness that if patient continues to show signs of decline they can discuss with outpatient palliative team and they will be able to assist with transferring his services to   Questions and concerns were addressed. The family was encouraged to call with questions or concerns.  PMT will continue to support holistically.   Primary Decision Maker:  [name]     SUMMARY OF RECOMMENDATIONS    DNR/DNI-as requested by patient, [name], and [name], niece. Out of facility   Continue to treat the treatable while hospitalized. Family remains hopeful for improvement but also preparing for the worst in future. They have request for him to return to facility with outpatient palliative  services.   CSW consult for outpatient Palliative services.   Palliative Medicine team will continue to support patient, family, and medical team during hospitalization as needed.   Code Status/Advance Care Planning:  DNR/DNI as requested per family   Palliative Prophylaxis:   Aspiration, Bowel Regimen, Delirium Protocol, Frequent Pain Assessment, Oral Care and Turn Reposition  Additional Recommendations (Limitations, Scope, Preferences):  Full Scope Treatment-Continue to treat the treatable.   Psycho-social/Spiritual:   Desire for further Chaplaincy support:NO   Prognosis:   Unable to determine-guarded to poor in the setting of dementia, decreased mobility, poor po intake, protein calorie malnutrition, GI bleed, PVD, hypertension, diabetes, multiple myeloma, arthritis, and CKD.   Discharge Planning: Per family's request patient to return to [facility] care with palliative services       Primary Diagnoses: Present on Admission: . UGIB (upper gastrointestinal bleed)  I have reviewed the medical record, interviewed the patient and family, and examined the patient. The following aspects are pertinent.  Past Medical History:  Diagnosis Date  . Arthritis   . Cancer ([place])    multiple myaloma  . Diabetes mellitus   . Dyspnea   . 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 Hypertension   . Hypothyroidism   . Neuropathy   . Peripheral vascular disease ([place])   . Venous stasis    Social History   Socioeconomic History  . Marital status: Married    Spouse name: Not on file  . Number of children: 2  . Years of education: Not on file  . Highest education level: Not on file  Occupational History  . Occupation: Custodian  Social Needs  . Financial resource strain: Not on file  . Food insecurity:    Worry: Not on file    Inability: Not on file  . Transportation needs:    Medical: Not on file    Non-medical: Not on file    Tobacco Use  . Smoking status: Former Smoker    Packs/day: 0.25    Types: Cigarettes    Last attempt to quit: 03/23/2015    Years since quitting: 3.2  . Smokeless tobacco: Current User    Types: Chew  Substance and Sexual Activity  . Alcohol use: Yes    Alcohol/week: 42.0 standard drinks    Types: 42 Cans of beer per week    Comment: 5-6 every day  . Drug use: No  . Sexual activity: Not on file    Comment: currently trying to quit now  Lifestyle  . Physical activity:    Days per week: Not on file    Minutes per session: Not on file  . Stress: Not on file  Relationships  . Social connections:    Talks on phone: Not on file    Gets together: Not on file    Attends religious service: Not on file    Active member of club or organization: Not on file    Attends meetings of clubs or organizations: Not on file    Relationship status: Not on file  Other Topics Concern  . Not on file  Social History Narrative   Lives at home with son, ambulates with a cane   Family History  Problem Relation Age of Onset  . Heart disease Mother   . Alzheimer's disease Sister   . Heart disease Maternal Aunt    Scheduled Meds: . Chlorhexidine Gluconate Cloth  6 each Topical Q0600  . insulin aspart  0-9 Units Subcutaneous Q4H  . mouth rinse  15 mL Mouth Rinse BID  . mupirocin ointment  1 application Nasal BID   Continuous Infusions: . 0.45 % NaCl with KCl 20 mEq / L 100 mL/hr at 06/06/18 6546  . famotidine (PEPCID) IV Stopped (06/05/18 2114)   PRN Meds:.acetaminophen **OR** acetaminophen, albuterol, LORazepam, ondansetron **OR** ondansetron (ZOFRAN) IV, polyethylene glycol Medications Prior to Admission:  Prior to Admission medications   Medication Sig Start Date End Date Taking? Authorizing Provider  ferrous sulfate 325 (65 FE) MG tablet Take 1 tablet (325 mg total) by mouth 2 (two) times daily with a meal. 05/21/18  Yes Salary, Montell D, MD  folic acid (FOLVITE) 1 MG tablet Take 1 tablet  (1 mg total) by mouth daily. 05/21/18  Yes Salary, Avel Peace, MD  ipratropium-albuterol (DUONEB) 0.5-2.5 (3) MG/3ML SOLN Take  3 mLs by nebulization every 4 (four) hours as needed (shortness of breath or wheezing). 06/02/18 06/16/18 Yes [provider]  levothyroxine (SYNTHROID, LEVOTHROID) 75 MCG tablet Take 1 tablet (75 mcg total) by mouth daily before breakfast. 05/21/18  Yes Salary, Montell D, MD  LORazepam (ATIVAN) 2 MG/ML concentrated solution Take 1 mg by mouth every 4 (four) hours as needed for anxiety. 05/25/18 06/08/18 Yes [provider]  megestrol (MEGACE) 40 MG tablet Take 1 tablet (40 mg total) by mouth 2 (two) times daily. 05/21/18  Yes Salary, Avel Peace, MD  mirtazapine (REMERON) 15 MG tablet Take 1 tablet (15 mg total) by mouth at bedtime. 05/21/18 08/19/18 Yes Salary, Avel Peace, MD  potassium chloride SA (K-DUR,KLOR-CON) 20 MEQ tablet Take 1 tablet (20 mEq total) by mouth 2 (two) times daily. 05/09/18  Yes Jacquelin Hawking, NP  tamsulosin (FLOMAX) 0.4 MG CAPS capsule Take 1 capsule (0.4 mg total) by mouth daily after breakfast. 05/21/18  Yes Salary, Montell D, MD  thiamine (VITAMIN B-1) 100 MG tablet Take 1 tablet (100 mg total) by mouth daily. 05/21/18  Yes Salary, Avel Peace, MD  vitamin C (VITAMIN C) 250 MG tablet Take 1 tablet (250 mg total) by mouth 2 (two) times daily. 05/21/18  Yes Salary, Avel Peace, MD  Hydrocortisone (GERHARDT'S BUTT CREAM) CREA Apply 1 application topically 4 (four) times daily. Patient not taking: Reported on 06/03/2018 05/21/18   Salary, Avel Peace, MD  levofloxacin (LEVAQUIN) 750 MG tablet Take 1 tablet (750 mg total) by mouth daily. Patient not taking: Reported on 06/03/2018 05/22/18   Salary, Avel Peace, MD  Nutritional Supplements (ENSURE ACTIVE HIGH PROTEIN) LIQD Take 1 Can by mouth 3 (three) times daily with meals. Patient not taking: Reported on 06/03/2018 05/21/18   Salary, Avel Peace, MD   Allergies  Allergen Reactions  . Metformin And Related  Nausea And Vomiting   Review of Systems  Constitutional: Positive for fatigue.  Neurological: Positive for weakness.  All other systems reviewed and are negative.   Physical Exam  Constitutional: Vital signs are normal. He is cooperative. He appears ill.  Thin and frail in appearance   Cardiovascular: Normal rate, regular rhythm, normal heart sounds and normal pulses.  Pulmonary/Chest: Effort normal. He has decreased breath sounds.  Abdominal: Soft. Normal appearance and bowel sounds are normal.  Neurological: He is alert.  Alert to self, family, and place. More alert today.   Skin: Skin is warm and dry. Bruising noted.  Psychiatric: Cognition and memory are impaired. He expresses inappropriate judgment.  Nursing note and vitals reviewed.   Vital Signs: BP (!) 159/72 (BP Location: Left Arm)   Pulse 73   Temp 97.9 F (36.6 C) (Oral)   Resp 18   Ht '5\' 7"'  (1.702 m)   Wt 55.5 kg   SpO2 100%   BMI 19.15 kg/m  Pain Scale: PAINAD   Pain Score: 0-No pain   SpO2: SpO2: 100 % O2 Device:SpO2: 100 % O2 Flow Rate: .   IO: Intake/output summary:   Intake/Output Summary (Last 24 hours) at 06/06/2018 0956 Last data filed at 06/06/2018 0826 Gross per 24 hour  Intake 2558.52 ml  Output -  Net 2558.52 ml    LBM: Last BM Date: 06/05/18 Baseline Weight: Weight: 58.5 kg Most recent weight: Weight: 55.5 kg     Palliative Assessment/Data: PPS 30%   Time In: 0845 Time Out: 1015 Time Total: 90 min.   Greater than 50%  of this time was  spent counseling and coordinating care related to the above assessment and plan.  Signed by: Alda Lea, NP-BC Palliative Medicine Team  Phone: [phone] Fax: [phone] Pager: [phone] Amion: Bjorn Pippin    Please contact Palliative Medicine Team phone at [phone] for questions and concerns.  For individual provider: See Shea Evans

## 2018-06-06 NOTE — Discharge Summary (Addendum)
[place] [facility] [place] NAME: [name]    MR#:  [id number]  DATE OF BIRTH:  Feb 07, 1941  DATE OF ADMISSION:  06/03/2018 ADMITTING PHYSICIAN: Hillary Bow, MD  DATE OF DISCHARGE: 06/07/2018  PRIMARY CARE PHYSICIAN: Valerie Roys, DO   ADMISSION DIAGNOSIS:  Vomiting [R11.10]  DISCHARGE DIAGNOSIS:  Active Problems:   UGIB (upper gastrointestinal bleed)   SECONDARY DIAGNOSIS:   Past Medical History:  Diagnosis Date  . Arthritis   . Cancer ([place])    multiple myaloma  . Diabetes mellitus   . Dyspnea   . 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 Hypertension   . Hypothyroidism   . Neuropathy   . Peripheral vascular disease ([place])   . Venous stasis      ADMITTING HISTORY  HISTORY OF PRESENT ILLNESS:  [name]  is a 77 y.o. male with a known history of dementia, multiple myeloma, hypertension, neuropathy, peripheral vascular disease, diabetes mellitus presented to the emergency room from [place] home due to having coffee-ground/black emesis.  Here in the emergency room he has been found to be tachycardic and is being admitted for further work-up of upper GI bleed.  Not on any anticoagulants.  Patient with dementia unable to contribute to history.  History obtained from son at bedside.  Unfortunately son is not aware of much of his dad's medical history and seems to be poor historian himself.   HOSPITAL COURSE:   * Upper GI bleed.  Hemoglobin stable .  No further hematemesis Appreciate GI input.  No EGD planned Repeat hemoglobin with no change PO anti acids  *Dementia with encephalopathy. This has been a progressive problem. Patient was worked up extensively during recent admission with MRI of the brain which was normal. EEG showed nothing acute.  Has progressively worsening dementia. Unable to make any medical decisions at this time.  *Diabetes mellitus.   stable  *Hypertension. No change to medications at discharge  Family met with Palliative care team and decide for SNF with palliative following  Hospice services would be appropriate when going home  Stable for discharge back to SNF with palliative following  Transition to hospice if any worsening  CONSULTS OBTAINED:   GI Palliative care DRUG ALLERGIES:   Allergies  Allergen Reactions  . Metformin And Related Nausea And Vomiting    DISCHARGE MEDICATIONS:   Allergies as of 06/07/2018      Reactions   Metformin And Related Nausea And Vomiting      Medication List    STOP taking these medications   Gerhardt's butt cream Crea   levofloxacin 750 MG tablet Commonly known as:  LEVAQUIN     TAKE these medications   ascorbic acid 250 MG tablet Commonly known as:  VITAMIN C Take 1 tablet (250 mg total) by mouth 2 (two) times daily.   ENSURE ACTIVE HIGH PROTEIN Liqd Take 1 Can by mouth 3 (three) times daily with meals.   famotidine 40 MG tablet Commonly known as:  PEPCID Take 1 tablet (40 mg total) by mouth every evening.   ferrous sulfate 325 (65 FE) MG tablet Take 1 tablet (325 mg total) by mouth 2 (two) times daily with a meal.   folic acid 1 MG tablet Commonly known as:  FOLVITE Take 1 tablet (1 mg total) by mouth daily.   ipratropium-albuterol 0.5-2.5 (3) MG/3ML Soln Commonly known as:  DUONEB Take 3  mLs by nebulization every 4 (four) hours as needed (shortness of breath or wheezing).   levothyroxine 75 MCG tablet Commonly known as:  SYNTHROID, LEVOTHROID Take 1 tablet (75 mcg total) by mouth daily before breakfast.   LORazepam 2 MG/ML concentrated solution Commonly known as:  ATIVAN Take 1 mg by mouth every 4 (four) hours as needed for anxiety.   megestrol 40 MG tablet Commonly known as:  MEGACE Take 1 tablet (40 mg total) by mouth 2 (two) times daily.   mirtazapine 15 MG tablet Commonly known as:  REMERON Take 1 tablet (15 mg total) by mouth  at bedtime.   potassium chloride SA 20 MEQ tablet Commonly known as:  K-DUR,KLOR-CON Take 1 tablet (20 mEq total) by mouth 2 (two) times daily.   tamsulosin 0.4 MG Caps capsule Commonly known as:  FLOMAX Take 1 capsule (0.4 mg total) by mouth daily after breakfast.   thiamine 100 MG tablet Commonly known as:  VITAMIN B-1 Take 1 tablet (100 mg total) by mouth daily.       Today   VITAL SIGNS:  Blood pressure (!) 144/61, pulse 83, temperature (!) 97.5 F (36.4 C), temperature source Oral, resp. rate 16, height _0  (1.702 m), weight 55.5 kg, SpO2 100 %.  I/O:    Intake/Output Summary (Last 24 hours) at 06/06/2018 1055 Last data filed at 06/06/2018 0826 Gross per 24 hour  Intake 2558.52 ml  Output -  Net 2558.52 ml    PHYSICAL EXAMINATION:  Physical Exam  GENERAL:  77 y.o.-year-old patient lying in the bed with no acute distress.  LUNGS: Normal breath sounds bilaterally CARDIOVASCULAR: S1, S2 normal. No murmurs, rubs, or gallops.  ABDOMEN: Soft, non-tender, non-distended. Bowel sounds present. NEUROLOGIC: Moves all 4 extremities. PSYCHIATRIC: The patient is alert and awake SKIN: No obvious rash, lesion, or ulcer.  Diffuse muscle wasting  DATA REVIEW:   CBC Recent Labs  Lab 06/04/18 0450  06/06/18 0400  WBC 6.9  --   --   HGB 9.6*   < > 9.3*  HCT 27.2*  --   --   PLT 115*  --   --    < > = values in this interval not displayed.    Chemistries  Recent Labs  Lab 06/03/18 0813  06/05/18 0411  NA 145   < > 147*  K 4.1   < > 3.9  CL 111   < > 119*  CO2 26   < > 23  GLUCOSE 158*   < > 107*  BUN 35*   < > 27*  CREATININE 0.66   < > 0.62  CALCIUM 9.5   < > 8.9  AST 31  --   --   ALT 28  --   --   ALKPHOS 124  --   --   BILITOT 1.8*  --   --    < > = values in this interval not displayed.    Cardiac Enzymes No results for input(s): TROPONINI in the last 168 hours.  Microbiology Results  Results for orders placed or performed during the hospital  encounter of 06/03/18  MRSA PCR Screening     Status: Abnormal   Collection Time: 06/03/18 10:49 AM  Result Value Ref Range Status   MRSA by PCR POSITIVE (A) NEGATIVE Final    Comment:        The GeneXpert MRSA Assay (FDA approved for NASAL specimens only), is one component of a comprehensive MRSA colonization surveillance program.  It is not intended to diagnose MRSA infection nor to guide or monitor treatment for MRSA infections. RESULT CALLED TO, READ BACK BY AND VERIFIED WITH: [place] 06/03/18 @ 83  [place] Performed at [facility], [address] [place], [place] [address]     RADIOLOGY:  No results found.  Follow up with PCP in 1 week.  Management plans discussed with the patient, family and they are in agreement.  CODE STATUS:     Code Status Orders  (From admission, onward)         Start     Ordered   06/06/18 0938  Do not attempt resuscitation (DNR)  Continuous    Question Answer Comment  In the event of cardiac or respiratory ARREST Do not call a "code blue"   In the event of cardiac or respiratory ARREST Do not perform Intubation, CPR, defibrillation or ACLS   In the event of cardiac or respiratory ARREST Use medication by any route, position, wound care, and other measures to relive pain and suffering. May use oxygen, suction and manual treatment of airway obstruction as needed for comfort.      06/06/18 0940        Code Status History    Date Active Date Inactive Code Status Order ID Comments User Context   06/03/2018 0942 06/06/2018 0940 Full Code [id number]  Hillary Bow, MD ED   05/19/2018 0002 05/21/2018 1447 Full Code [id number]  Harrie Foreman, MD ED   06/20/2017 1230 06/21/2017 1613 Full Code [id number]  Gladstone Lighter, MD Inpatient      TOTAL TIME TAKING CARE OF THIS PATIENT ON DAY OF DISCHARGE: more than 30 minutes.   [name] M.D on 06/07/2018 at 7:21 AM  Between 7am to 6pm - Pager - [phone]  After 6pm go to  [email or web address] - password [email or web address] [facility]  Office  [phone]  CC: Primary care physician; Valerie Roys, DO  Note: This dictation was prepared with Dragon dictation along with smaller phrase technology. Any transcriptional errors that result from this process are unintentional.

## 2018-06-06 NOTE — Progress Notes (Addendum)
New referral for Palliative to follow at [facility] care received from [place] following a Palliative Medicine consult. Patient information faxed to referral. Flo Shanks RN, BSN, [facility] and Palliative Care of [facility], hospital Liaison [phone] Update: patient is currently followed by out patient Palliative at [facility]. Hospital care team made aware. Updated notes faxed to Outpatient Palliative team.

## 2018-06-06 NOTE — Progress Notes (Signed)
[facility] [place] NAME: [name]    MR#:  [id number]  DATE OF BIRTH:  Dec 14, 1940  SUBJECTIVE:  CHIEF COMPLAINT:   Chief Complaint  Patient presents with  . Hematemesis   Patient is more awake.  Son, sister and niece at bedside  REVIEW OF SYSTEMS:    Review of Systems  Unable to perform ROS: Dementia    DRUG ALLERGIES:   Allergies  Allergen Reactions  . Metformin And Related Nausea And Vomiting    VITALS:  Blood pressure (!) 159/72, pulse 73, temperature 97.9 F (36.6 C), temperature source Oral, resp. rate 18, height 5\' 7"  (1.702 m), weight 55.5 kg, SpO2 100 %.  PHYSICAL EXAMINATION:   Physical Exam  GENERAL:  77 y.o.-year-old patient lying in the bed with no acute distress.  EYES: Pupils equal, round, reactive to light and accommodation. No scleral icterus. Extraocular muscles intact.  HEENT: Head atraumatic, normocephalic. Oropharynx and nasopharynx clear.  NECK:  Supple, no jugular venous distention. No thyroid enlargement, no tenderness.  LUNGS: Normal breath sounds bilaterally, no wheezing, rales, rhonchi. No use of accessory muscles of respiration.  CARDIOVASCULAR: S1, S2 normal. No murmurs, rubs, or gallops.  ABDOMEN: Soft, nontender, nondistended. Bowel sounds present. No organomegaly or mass.  EXTREMITIES: No cyanosis, clubbing or edema b/l.   Diffuse muscle wasting NEUROLOGIC: Moves all 4 extremities.   PSYCHIATRIC: The patient is awake and alert SKIN: No obvious rash  LABORATORY PANEL:   CBC Recent Labs  Lab 06/04/18 0450  06/06/18 0400  WBC 6.9  --   --   HGB 9.6*   < > 9.3*  HCT 27.2*  --   --   PLT 115*  --   --    < > = values in this interval not displayed.   ------------------------------------------------------------------------------------------------------------------ Chemistries  Recent Labs  Lab 06/03/18 0813  06/05/18 0411  NA 145   < > 147*  K 4.1   < > 3.9  CL 111   < > 119*   CO2 26   < > 23  GLUCOSE 158*   < > 107*  BUN 35*   < > 27*  CREATININE 0.66   < > 0.62  CALCIUM 9.5   < > 8.9  AST 31  --   --   ALT 28  --   --   ALKPHOS 124  --   --   BILITOT 1.8*  --   --    < > = values in this interval not displayed.   ------------------------------------------------------------------------------------------------------------------  Cardiac Enzymes No results for input(s): TROPONINI in the last 168 hours. ------------------------------------------------------------------------------------------------------------------  RADIOLOGY:  No results found.   ASSESSMENT AND PLAN:   *Upper GI bleed.   Hemoglobin stable today.  No further hematemesis Appreciate GI input.  No EGD planned No further bleeding.  Hemoglobin stable.  *Dementia with encephalopathy.  This has been a progressive problem.  Patient was worked up extensively during recent admission with MRI of the brain which was normal.  EEG showed nothing acute.   Has progressively worsening dementia. Unable to make any medical decisions at this time  *Diabetes mellitus.   Sliding scale insulin  *Hypertension.  Hold medications.  DVT prophylaxis with SCDs  Discussed with family.  Family meeting earlier with palliative care.  Do Not resuscitate.  Discharged to skilled nursing facility with palliative care following.  Transition to hospice if any worsening.  All the records are reviewed and case  discussed with Care Management/Social Worker Management plans discussed with the patient, family and they are in agreement.  CODE STATUS: DNR  DVT Prophylaxis: SCDs  TOTAL TIME TAKING CARE OF THIS PATIENT: 40 minutes.  Greater than 50% of the time spent in discussing with family and coordinating care  POSSIBLE D/C IN 1-2 DAYS, DEPENDING ON CLINICAL CONDITION.  Leia Alf Vadhir Mcnay M.D on 06/06/2018 at 7:35 PM  Between 7am to 6pm - Pager - [phone]  After 6pm go to [email or web address] - password [email or web address]  [facility]  Office  [phone]  CC: Primary care physician; Valerie Roys, DO  Note: This dictation was prepared with Dragon dictation along with smaller phrase technology. Any transcriptional errors that result from this process are unintentional.

## 2018-06-07 DIAGNOSIS — D649 Anemia, unspecified: Secondary | ICD-10-CM | POA: Diagnosis not present

## 2018-06-07 DIAGNOSIS — E46 Unspecified protein-calorie malnutrition: Secondary | ICD-10-CM | POA: Diagnosis not present

## 2018-06-07 DIAGNOSIS — E1122 Type 2 diabetes mellitus with diabetic chronic kidney disease: Secondary | ICD-10-CM | POA: Diagnosis not present

## 2018-06-07 DIAGNOSIS — E1140 Type 2 diabetes mellitus with diabetic neuropathy, unspecified: Secondary | ICD-10-CM | POA: Diagnosis not present

## 2018-06-07 DIAGNOSIS — E860 Dehydration: Secondary | ICD-10-CM | POA: Diagnosis not present

## 2018-06-07 DIAGNOSIS — M361 Arthropathy in neoplastic disease: Secondary | ICD-10-CM | POA: Diagnosis not present

## 2018-06-07 DIAGNOSIS — L8992 Pressure ulcer of unspecified site, stage 2: Secondary | ICD-10-CM | POA: Diagnosis not present

## 2018-06-07 DIAGNOSIS — F0390 Unspecified dementia without behavioral disturbance: Secondary | ICD-10-CM | POA: Diagnosis present

## 2018-06-07 DIAGNOSIS — Z820 Family history of epilepsy and other diseases of the nervous system: Secondary | ICD-10-CM | POA: Diagnosis not present

## 2018-06-07 DIAGNOSIS — I878 Other specified disorders of veins: Secondary | ICD-10-CM | POA: Diagnosis not present

## 2018-06-07 DIAGNOSIS — Z7189 Other specified counseling: Secondary | ICD-10-CM | POA: Diagnosis not present

## 2018-06-07 DIAGNOSIS — Z9181 History of falling: Secondary | ICD-10-CM | POA: Diagnosis not present

## 2018-06-07 DIAGNOSIS — N400 Enlarged prostate without lower urinary tract symptoms: Secondary | ICD-10-CM | POA: Diagnosis present

## 2018-06-07 DIAGNOSIS — Z87891 Personal history of nicotine dependence: Secondary | ICD-10-CM | POA: Diagnosis not present

## 2018-06-07 DIAGNOSIS — Z8579 Personal history of other malignant neoplasms of lymphoid, hematopoietic and related tissues: Secondary | ICD-10-CM | POA: Diagnosis not present

## 2018-06-07 DIAGNOSIS — R0600 Dyspnea, unspecified: Secondary | ICD-10-CM | POA: Diagnosis not present

## 2018-06-07 DIAGNOSIS — R293 Abnormal posture: Secondary | ICD-10-CM | POA: Diagnosis not present

## 2018-06-07 DIAGNOSIS — R410 Disorientation, unspecified: Secondary | ICD-10-CM | POA: Diagnosis not present

## 2018-06-07 DIAGNOSIS — I7389 Other specified peripheral vascular diseases: Secondary | ICD-10-CM | POA: Diagnosis not present

## 2018-06-07 DIAGNOSIS — N179 Acute kidney failure, unspecified: Secondary | ICD-10-CM | POA: Diagnosis not present

## 2018-06-07 DIAGNOSIS — M1990 Unspecified osteoarthritis, unspecified site: Secondary | ICD-10-CM | POA: Diagnosis not present

## 2018-06-07 DIAGNOSIS — M6281 Muscle weakness (generalized): Secondary | ICD-10-CM | POA: Diagnosis not present

## 2018-06-07 DIAGNOSIS — G629 Polyneuropathy, unspecified: Secondary | ICD-10-CM | POA: Diagnosis not present

## 2018-06-07 DIAGNOSIS — K921 Melena: Secondary | ICD-10-CM | POA: Diagnosis not present

## 2018-06-07 DIAGNOSIS — I129 Hypertensive chronic kidney disease with stage 1 through stage 4 chronic kidney disease, or unspecified chronic kidney disease: Secondary | ICD-10-CM | POA: Diagnosis not present

## 2018-06-07 DIAGNOSIS — I10 Essential (primary) hypertension: Secondary | ICD-10-CM | POA: Diagnosis not present

## 2018-06-07 DIAGNOSIS — C9000 Multiple myeloma not having achieved remission: Secondary | ICD-10-CM | POA: Diagnosis not present

## 2018-06-07 DIAGNOSIS — Z7401 Bed confinement status: Secondary | ICD-10-CM | POA: Diagnosis not present

## 2018-06-07 DIAGNOSIS — R627 Adult failure to thrive: Secondary | ICD-10-CM | POA: Diagnosis not present

## 2018-06-07 DIAGNOSIS — Z515 Encounter for palliative care: Secondary | ICD-10-CM | POA: Diagnosis not present

## 2018-06-07 DIAGNOSIS — Z79899 Other long term (current) drug therapy: Secondary | ICD-10-CM | POA: Diagnosis not present

## 2018-06-07 DIAGNOSIS — E0841 Diabetes mellitus due to underlying condition with diabetic mononeuropathy: Secondary | ICD-10-CM | POA: Diagnosis not present

## 2018-06-07 DIAGNOSIS — Z66 Do not resuscitate: Secondary | ICD-10-CM | POA: Diagnosis not present

## 2018-06-07 DIAGNOSIS — R4182 Altered mental status, unspecified: Secondary | ICD-10-CM | POA: Diagnosis present

## 2018-06-07 DIAGNOSIS — E039 Hypothyroidism, unspecified: Secondary | ICD-10-CM | POA: Diagnosis not present

## 2018-06-07 DIAGNOSIS — Z888 Allergy status to other drugs, medicaments and biological substances status: Secondary | ICD-10-CM | POA: Diagnosis not present

## 2018-06-07 DIAGNOSIS — R531 Weakness: Secondary | ICD-10-CM | POA: Diagnosis not present

## 2018-06-07 DIAGNOSIS — K922 Gastrointestinal hemorrhage, unspecified: Secondary | ICD-10-CM | POA: Diagnosis not present

## 2018-06-07 DIAGNOSIS — G9341 Metabolic encephalopathy: Secondary | ICD-10-CM | POA: Diagnosis not present

## 2018-06-07 DIAGNOSIS — G9340 Encephalopathy, unspecified: Secondary | ICD-10-CM | POA: Diagnosis not present

## 2018-06-07 DIAGNOSIS — E538 Deficiency of other specified B group vitamins: Secondary | ICD-10-CM | POA: Diagnosis not present

## 2018-06-07 DIAGNOSIS — N181 Chronic kidney disease, stage 1: Secondary | ICD-10-CM | POA: Diagnosis not present

## 2018-06-07 DIAGNOSIS — L219 Seborrheic dermatitis, unspecified: Secondary | ICD-10-CM | POA: Diagnosis not present

## 2018-06-07 DIAGNOSIS — Z8249 Family history of ischemic heart disease and other diseases of the circulatory system: Secondary | ICD-10-CM | POA: Diagnosis not present

## 2018-06-07 DIAGNOSIS — C9002 Multiple myeloma in relapse: Secondary | ICD-10-CM | POA: Diagnosis not present

## 2018-06-07 DIAGNOSIS — E119 Type 2 diabetes mellitus without complications: Secondary | ICD-10-CM | POA: Diagnosis not present

## 2018-06-07 DIAGNOSIS — K920 Hematemesis: Secondary | ICD-10-CM | POA: Diagnosis not present

## 2018-06-07 DIAGNOSIS — E1151 Type 2 diabetes mellitus with diabetic peripheral angiopathy without gangrene: Secondary | ICD-10-CM | POA: Diagnosis not present

## 2018-06-07 LAB — GLUCOSE, CAPILLARY
GLUCOSE-CAPILLARY: 89 mg/dL (ref 70–99)
GLUCOSE-CAPILLARY: 85 mg/dL (ref 70–99)

## 2018-06-07 NOTE — Clinical Social Work Note (Signed)
Patient is medically ready for discharge back to [facility] today. CSW notified patient's son Verlie Hellenbrand [phone] of discharge today. CSW notified Claiborne Billings, admissions coordinator at [facility] of discharge today. Patient will be transported be EMS. RN will call report and call for transport.   [place], [place]

## 2018-06-07 NOTE — Progress Notes (Signed)
Report called to [facility], RN and EMS contacted for transport.  Clarise Cruz, RN, BSN

## 2018-06-07 NOTE — Progress Notes (Signed)
Patient transported via EMS to [facility].  Clarise Cruz, RN, BSN

## 2018-06-13 DIAGNOSIS — N179 Acute kidney failure, unspecified: Secondary | ICD-10-CM | POA: Diagnosis not present

## 2018-06-13 DIAGNOSIS — L219 Seborrheic dermatitis, unspecified: Secondary | ICD-10-CM | POA: Diagnosis not present

## 2018-06-15 ENCOUNTER — Emergency Department: Payer: Medicare Other

## 2018-06-15 ENCOUNTER — Inpatient Hospital Stay
Admission: EM | Admit: 2018-06-15 | Discharge: 2018-06-16 | DRG: 072 | Disposition: A | Discharge status: Skilled Nursing Facility | Payer: Medicare Other | Attending: Internal Medicine | Admitting: Internal Medicine

## 2018-06-15 ENCOUNTER — Encounter: Payer: Self-pay | Admitting: Emergency Medicine

## 2018-06-15 ENCOUNTER — Inpatient Hospital Stay: Payer: Medicare Other | Attending: Oncology

## 2018-06-15 ENCOUNTER — Other Ambulatory Visit: Payer: Self-pay

## 2018-06-15 DIAGNOSIS — F0390 Unspecified dementia without behavioral disturbance: Secondary | ICD-10-CM | POA: Diagnosis present

## 2018-06-15 DIAGNOSIS — G9341 Metabolic encephalopathy: Secondary | ICD-10-CM | POA: Diagnosis not present

## 2018-06-15 DIAGNOSIS — D649 Anemia, unspecified: Secondary | ICD-10-CM | POA: Diagnosis present

## 2018-06-15 DIAGNOSIS — E039 Hypothyroidism, unspecified: Secondary | ICD-10-CM | POA: Diagnosis present

## 2018-06-15 DIAGNOSIS — R6251 Failure to thrive (child): Secondary | ICD-10-CM | POA: Diagnosis present

## 2018-06-15 DIAGNOSIS — Z9181 History of falling: Secondary | ICD-10-CM | POA: Diagnosis not present

## 2018-06-15 DIAGNOSIS — Z87891 Personal history of nicotine dependence: Secondary | ICD-10-CM

## 2018-06-15 DIAGNOSIS — E860 Dehydration: Secondary | ICD-10-CM | POA: Diagnosis not present

## 2018-06-15 DIAGNOSIS — N179 Acute kidney failure, unspecified: Secondary | ICD-10-CM | POA: Diagnosis not present

## 2018-06-15 DIAGNOSIS — L8992 Pressure ulcer of unspecified site, stage 2: Secondary | ICD-10-CM | POA: Diagnosis present

## 2018-06-15 DIAGNOSIS — N400 Enlarged prostate without lower urinary tract symptoms: Secondary | ICD-10-CM | POA: Diagnosis present

## 2018-06-15 DIAGNOSIS — E46 Unspecified protein-calorie malnutrition: Secondary | ICD-10-CM | POA: Diagnosis not present

## 2018-06-15 DIAGNOSIS — Z66 Do not resuscitate: Secondary | ICD-10-CM | POA: Diagnosis not present

## 2018-06-15 DIAGNOSIS — R410 Disorientation, unspecified: Secondary | ICD-10-CM | POA: Diagnosis not present

## 2018-06-15 DIAGNOSIS — I10 Essential (primary) hypertension: Secondary | ICD-10-CM | POA: Diagnosis not present

## 2018-06-15 DIAGNOSIS — R627 Adult failure to thrive: Secondary | ICD-10-CM | POA: Diagnosis present

## 2018-06-15 DIAGNOSIS — Z888 Allergy status to other drugs, medicaments and biological substances status: Secondary | ICD-10-CM

## 2018-06-15 DIAGNOSIS — K922 Gastrointestinal hemorrhage, unspecified: Secondary | ICD-10-CM

## 2018-06-15 DIAGNOSIS — Z515 Encounter for palliative care: Secondary | ICD-10-CM | POA: Diagnosis not present

## 2018-06-15 DIAGNOSIS — Z7189 Other specified counseling: Secondary | ICD-10-CM | POA: Diagnosis not present

## 2018-06-15 DIAGNOSIS — L219 Seborrheic dermatitis, unspecified: Secondary | ICD-10-CM | POA: Diagnosis not present

## 2018-06-15 DIAGNOSIS — Z7401 Bed confinement status: Secondary | ICD-10-CM | POA: Diagnosis not present

## 2018-06-15 DIAGNOSIS — I878 Other specified disorders of veins: Secondary | ICD-10-CM | POA: Diagnosis present

## 2018-06-15 DIAGNOSIS — R4182 Altered mental status, unspecified: Secondary | ICD-10-CM | POA: Diagnosis present

## 2018-06-15 DIAGNOSIS — K921 Melena: Secondary | ICD-10-CM | POA: Diagnosis not present

## 2018-06-15 DIAGNOSIS — Z8579 Personal history of other malignant neoplasms of lymphoid, hematopoietic and related tissues: Secondary | ICD-10-CM | POA: Diagnosis not present

## 2018-06-15 DIAGNOSIS — G629 Polyneuropathy, unspecified: Secondary | ICD-10-CM | POA: Diagnosis not present

## 2018-06-15 DIAGNOSIS — R531 Weakness: Secondary | ICD-10-CM | POA: Diagnosis not present

## 2018-06-15 DIAGNOSIS — C9000 Multiple myeloma not having achieved remission: Secondary | ICD-10-CM | POA: Diagnosis not present

## 2018-06-15 LAB — CBC WITH DIFFERENTIAL/PLATELET
Basophils Absolute: 0 10*3/uL (ref 0–0.1)
Basophils Relative: 0 %
Eosinophils Absolute: 0 10*3/uL (ref 0–0.7)
Eosinophils Relative: 1 %
HEMATOCRIT: 30.3 % — AB (ref 40.0–52.0)
Hemoglobin: 10.6 g/dL — ABNORMAL LOW (ref 13.0–18.0)
LYMPHS ABS: 0.9 10*3/uL — AB (ref 1.0–3.6)
LYMPHS PCT: 14 %
MCH: 36.9 pg — ABNORMAL HIGH (ref 26.0–34.0)
MCHC: 35.1 g/dL (ref 32.0–36.0)
MCV: 105 fL — AB (ref 80.0–100.0)
MONO ABS: 0.4 10*3/uL (ref 0.2–1.0)
MONOS PCT: 7 %
NEUTROS ABS: 5 10*3/uL (ref 1.4–6.5)
Neutrophils Relative %: 78 %
Platelets: 147 10*3/uL — ABNORMAL LOW (ref 150–440)
RBC: 2.88 MIL/uL — ABNORMAL LOW (ref 4.40–5.90)
RDW: 15.7 % — AB (ref 11.5–14.5)
WBC: 6.4 10*3/uL (ref 3.8–10.6)

## 2018-06-15 LAB — COMPREHENSIVE METABOLIC PANEL
ALT: 41 U/L (ref 0–44)
AST: 39 U/L (ref 15–41)
Albumin: 3.8 g/dL (ref 3.5–5.0)
Alkaline Phosphatase: 118 U/L (ref 38–126)
Anion gap: 6 (ref 5–15)
BILIRUBIN TOTAL: 2.1 mg/dL — AB (ref 0.3–1.2)
BUN: 35 mg/dL — AB (ref 8–23)
CO2: 26 mmol/L (ref 22–32)
CREATININE: 0.87 mg/dL (ref 0.61–1.24)
Calcium: 9.7 mg/dL (ref 8.9–10.3)
Chloride: 111 mmol/L (ref 98–111)
GFR calc Af Amer: >60 mL/min (ref >60)
Glucose, Bld: 137 mg/dL — ABNORMAL HIGH (ref 70–99)
Potassium: 4.6 mmol/L (ref 3.5–5.1)
Sodium: 143 mmol/L (ref 135–145)
Total Protein: 5.7 g/dL — ABNORMAL LOW (ref 6.5–8.1)

## 2018-06-15 LAB — AMMONIA: Ammonia: 27 umol/L (ref 9–35)

## 2018-06-15 LAB — URINALYSIS, COMPLETE (UACMP) WITH MICROSCOPIC
Bacteria, UA: NONE SEEN
Bilirubin Urine: NEGATIVE
GLUCOSE, UA: NEGATIVE mg/dL
Hgb urine dipstick: NEGATIVE
Ketones, ur: NEGATIVE mg/dL
Leukocytes, UA: NEGATIVE
Nitrite: NEGATIVE
Protein, ur: NEGATIVE mg/dL
Specific Gravity, Urine: 1.02 (ref 1.005–1.030)
pH: 5 (ref 5.0–8.0)

## 2018-06-15 LAB — TSH: TSH: 3.35 u[IU]/mL (ref 0.350–4.500)

## 2018-06-15 LAB — LACTIC ACID, PLASMA
LACTIC ACID, VENOUS: 0.9 mmol/L (ref 0.5–1.9)
Lactic Acid, Venous: 1.9 mmol/L (ref 0.5–1.9)

## 2018-06-15 LAB — TYPE AND SCREEN
ABO/RH(D): A NEG
Antibody Screen: NEGATIVE

## 2018-06-15 LAB — TROPONIN I: Troponin I: <0.03 ng/mL (ref <0.03)

## 2018-06-15 MED ORDER — ACETAMINOPHEN 650 MG RE SUPP: 650.0000 mg | Freq: Four times a day (QID) | RECTAL | Status: DC | PRN

## 2018-06-15 MED ORDER — SODIUM CHLORIDE 0.9 % IV BOLUS
500.0000 mL | Freq: Once | INTRAVENOUS | Status: AC
  Administered 2018-06-15: 500 mL via INTRAVENOUS

## 2018-06-15 MED ORDER — SODIUM CHLORIDE 0.9 % IV SOLN
Freq: Once | INTRAVENOUS | Status: AC
  Administered 2018-06-15: 21:00:00 via INTRAVENOUS

## 2018-06-15 MED ORDER — ACETAMINOPHEN 325 MG PO TABS: 650.0000 mg | ORAL_TABLET | Freq: Four times a day (QID) | ORAL | Status: DC | PRN

## 2018-06-15 MED ORDER — ENOXAPARIN SODIUM 40 MG/0.4ML ~~LOC~~ SOLN
40.0000 mg | SUBCUTANEOUS | Status: DC
  Administered 2018-06-15: 40 mg via SUBCUTANEOUS
  Filled 2018-06-15: qty 0.4

## 2018-06-15 MED ORDER — ONDANSETRON HCL 4 MG PO TABS: 4.0000 mg | ORAL_TABLET | Freq: Four times a day (QID) | ORAL | Status: DC | PRN

## 2018-06-15 MED ORDER — ONDANSETRON HCL 4 MG/2ML IJ SOLN: 4.0000 mg | Freq: Four times a day (QID) | INTRAMUSCULAR | Status: DC | PRN

## 2018-06-15 NOTE — ED Notes (Signed)
IV access has been attempted x 3 with no success. Will ask another nurse to attempt to stick pt.

## 2018-06-15 NOTE — ED Triage Notes (Signed)
FIRST NURSE NOTE-here for possible dehydration.  Garbled speech, started last month per family.  alert

## 2018-06-15 NOTE — ED Notes (Signed)
Obtained rectal temperature on pt. Noticed dark tarry stool at this time. PA student in the room also. Hemoccult test performed and pt is Guaiac positive. MD made aware. Pt's core temp is 95.7 F and bair hugger placed at this time. MD aware.

## 2018-06-15 NOTE — ED Provider Notes (Signed)
[facility] Emergency Department Provider Note ____________________________________________   First MD Initiated Contact with Patient 06/15/18 1906     (approximate)  I have reviewed the triage vital signs and the nursing notes.   HISTORY  Chief Complaint Dehydration  HPI [name] is a 77 y.o. male with a history of multiple myeloma as well as recent admission for encephalopathy and upper GI bleed who was presented to the emergency department with worsening mentation as well as a fall that happened yesterday.  He was originally accompanied by his son who has since left.  However, the son gave a history of the patient being increasingly weak and decreasingly verbal.  Patient had a fall yesterday as well.  Patient unable to give further history.  During rectal temperature check the patient was found to have black tarry stool which was heme positive by nursing.  Past Medical History:  Diagnosis Date  . Arthritis   . Cancer ([place])    multiple myaloma  . Diabetes mellitus   . Dyspnea   . 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 Hypertension   . Hypothyroidism   . Neuropathy   . Peripheral vascular disease ([place])   . Venous stasis     Patient Active Problem List   Diagnosis Date Noted  . UGIB (upper gastrointestinal bleed) 06/03/2018  . CAP (community acquired pneumonia) 05/20/2018  . Pressure injury of skin 05/19/2018  . Altered mental status   . Palliative care by specialist   . Weakness 05/18/2018  . B12 deficiency 09/26/2017  . Multiple myeloma ([place]) 09/08/2017  . Goals of care, counseling/discussion 09/08/2017  . PAD (peripheral artery disease) ([place]) 08/08/2017  . Peripheral neuropathy 07/01/2017  . Hyponatremia 06/20/2017  . Leg cramps 03/30/2017  . Elevated alkaline phosphatase level 01/03/2017  . Elevated serum GGT level 01/03/2017  . Hypothyroidism 04/08/2015  . Benign hypertensive renal disease  04/07/2015  . Leukopenia 04/07/2015  . Type 2 diabetes, controlled, with neuropathy ([place]) 04/07/2015  . 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 Pancytopenia ([place]) 04/07/2015    Past Surgical History:  Procedure Laterality Date  . CATARACT EXTRACTION W/PHACO Right 10/25/2016   Procedure: CATARACT EXTRACTION PHACO AND INTRAOCULAR LENS PLACEMENT (IOC)  right eye complicated;  Surgeon: Ronnell Freshwater, MD;  Location: [facility];  Service: Ophthalmology;  Laterality: Right;  Right eye Diabetic - oral meds Vision blue  . FINGER SURGERY      Prior to Admission medications   Medication Sig Start Date End Date Taking? Authorizing Provider  famotidine (PEPCID) 40 MG tablet Take 1 tablet (40 mg total) by mouth every evening. 06/06/18 08/05/18  Hillary Bow, MD  ferrous sulfate 325 (65 FE) MG tablet Take 1 tablet (325 mg total) by mouth 2 (two) times daily with a meal. 05/21/18   Salary, Avel Peace, MD  folic acid (FOLVITE) 1 MG tablet Take 1 tablet (1 mg total) by mouth daily. 05/21/18   Salary, Avel Peace, MD  ipratropium-albuterol (DUONEB) 0.5-2.5 (3) MG/3ML SOLN Take 3 mLs by nebulization every 4 (four) hours as needed (shortness of breath or wheezing). 06/02/18 06/16/18  [provider]  levothyroxine (SYNTHROID, LEVOTHROID) 75 MCG tablet Take 1 tablet (75 mcg total) by mouth daily before breakfast. 05/21/18   Salary, Avel Peace, MD  megestrol (MEGACE) 40 MG tablet Take 1 tablet (40 mg total) by mouth 2 (two) times daily. 05/21/18  Salary, Avel Peace, MD  mirtazapine (REMERON) 15 MG tablet Take 1 tablet (15 mg total) by mouth at bedtime. 05/21/18 08/19/18  Salary, Avel Peace, MD  Nutritional Supplements (ENSURE ACTIVE HIGH PROTEIN) LIQD Take 1 Can by mouth 3 (three) times daily with meals. Patient not taking: Reported on 06/03/2018 05/21/18   Salary, Holly Bodily D, MD  potassium chloride SA (K-DUR,KLOR-CON) 20 MEQ tablet Take 1 tablet (20 mEq total) by mouth 2  (two) times daily. 05/09/18   Jacquelin Hawking, NP  tamsulosin (FLOMAX) 0.4 MG CAPS capsule Take 1 capsule (0.4 mg total) by mouth daily after breakfast. 05/21/18   Salary, Avel Peace, MD  thiamine (VITAMIN B-1) 100 MG tablet Take 1 tablet (100 mg total) by mouth daily. 05/21/18   Salary, Avel Peace, MD  vitamin C (VITAMIN C) 250 MG tablet Take 1 tablet (250 mg total) by mouth 2 (two) times daily. 05/21/18   Salary, Avel Peace, MD    Allergies Metformin and related  Family History  Problem Relation Age of Onset  . Heart disease Mother   . Alzheimer's disease Sister   . Heart disease Maternal Aunt     Social History Social History   Tobacco Use  . Smoking status: Former Smoker    Packs/day: 0.25    Types: Cigarettes    Last attempt to quit: 03/23/2015    Years since quitting: 3.2  . Smokeless tobacco: Current User    Types: Chew  Substance Use Topics  . Alcohol use: Not Currently    Alcohol/week: 42.0 standard drinks    Types: 42 Cans of beer per week    Comment: 5-6 every day  . Drug use: No    Review of Systems  Level 5 caveat secondary to patient nonverbal at this time.  ____________________________________________   PHYSICAL EXAM:  VITAL SIGNS: ED Triage Vitals  Enc Vitals Group     BP 06/15/18 1619 94/61     Pulse Rate 06/15/18 1619 (!) 115     Resp 06/15/18 1619 16     Temp 06/15/18 1619 (!) 95.7 F (35.4 C)     Temp Source 06/15/18 1619 Rectal     SpO2 06/15/18 1619 100 %     Weight 06/15/18 1621 110 lb 3.7 oz (50 kg)     Height 06/15/18 1621 _0  (1.803 m)     Head Circumference --      Peak Flow --      Pain Score --      Pain Loc --      Pain Edu? --      Excl. in [place]? --     Constitutional: Patient arousable to sternal rub.  Moves all 4 extremities.  Localizes to pain.  Nonverbal at this time.  Grunts only. Eyes: Conjunctivae are normal.  Head: Atraumatic. Nose: No congestion/rhinnorhea. Mouth/Throat: Mucous membranes are moist.  Neck: No  stridor.   Cardiovascular: Normal rate, regular rhythm. Grossly normal heart sounds.   Respiratory: Normal respiratory effort.  No retractions. Lungs CTAB. Gastrointestinal: Soft and nontender. No distention.  Musculoskeletal: No lower extremity tenderness nor edema.  No joint effusions. Neurologic:   No gross focal neurologic deficits are appreciated. Skin:  Skin is warm, dry and intact. No rash noted. Psychiatric: Mood and affect are normal. Speech and behavior are normal.  ____________________________________________   LABS (all labs ordered are listed, but only abnormal results are displayed)  Labs Reviewed  CBC WITH DIFFERENTIAL/PLATELET - Abnormal; Notable for the following components:  Result Value   RBC 2.88 (*)    Hemoglobin 10.6 (*)    HCT 30.3 (*)    MCV 105.0 (*)    MCH 36.9 (*)    RDW 15.7 (*)    Platelets 147 (*)    Lymphs Abs 0.9 (*)    All other components within normal limits  COMPREHENSIVE METABOLIC PANEL - Abnormal; Notable for the following components:   Glucose, Bld 137 (*)    BUN 35 (*)    Total Protein 5.7 (*)    Total Bilirubin 2.1 (*)    All other components within normal limits  URINALYSIS, COMPLETE (UACMP) WITH MICROSCOPIC - Abnormal; Notable for the following components:   Color, Urine AMBER (*)    APPearance CLEAR (*)    All other components within normal limits  CULTURE, BLOOD (ROUTINE X 2)  CULTURE, BLOOD (ROUTINE X 2)  TROPONIN I  TSH  AMMONIA  LACTIC ACID, PLASMA  LACTIC ACID, PLASMA  TYPE AND SCREEN   ____________________________________________  EKG  ED ECG REPORT I, Doran Stabler, the attending physician, personally viewed and interpreted this ECG.   Date: 06/15/2018  EKG Time: 0813  Rate: 101  Rhythm: sinus tachycardia  Axis: Normal  Intervals:nonspecific intraventricular conduction delay  ST&T Change: No ST segment elevation or depression.  T wave inversions in 1 as well as aVL.  Also with inversions in V5 and  V6.  ____________________________________________  RADIOLOGY  No acute finding on CT brain with the CT of the cervical spine. ____________________________________________   PROCEDURES  Procedure(s) performed:   Procedures  Critical Care performed:   ____________________________________________   INITIAL IMPRESSION / ASSESSMENT AND PLAN / ED COURSE  Pertinent labs & imaging results that were available during my care of the patient were reviewed by me and considered in my medical decision making (see chart for details).  Differential diagnosis includes, but is not limited to, alcohol, illicit or prescription medications, or other toxic ingestion; intracranial pathology such as stroke or intracerebral hemorrhage; fever or infectious causes including sepsis; hypoxemia and/or hypercarbia; uremia; trauma; endocrine related disorders such as diabetes, hypoglycemia, and thyroid-related diseases; hypertensive encephalopathy; etc.  As part of my medical decision making, I reviewed the following data within the electronic MEDICAL RECORD NUMBER Notes from prior ED visits  ----------------------------------------- 7:30 PM on 06/15/2018 -----------------------------------------  Patient with encephalopathy.  GI bleeding.  DNR during last admission.  Hypothermic today as well.  Recommendation was for pursuing of hospice care if condition worsens.  Patient be admitted to the hospital.  Possible GI bleed causing weakness.  Pending lactic acid as well as blood cultures as well for possible sepsis.  Signed out to Dr. Posey Pronto. ____________________________________________   FINAL CLINICAL IMPRESSION(S) / ED DIAGNOSES  Final diagnoses:  Altered mental status, unspecified altered mental status type  Gastrointestinal hemorrhage, unspecified gastrointestinal hemorrhage type      NEW MEDICATIONS STARTED DURING THIS VISIT:  New Prescriptions   No medications on file     Note:  This document was  prepared using Dragon voice recognition software and may include unintentional dictation errors.     Orbie Pyo, MD 06/15/18 [phone]

## 2018-06-15 NOTE — ED Notes (Signed)
BLOOD CULTURE X1 ONLY SENT DUE TO MULTIPLE IV AND BUTTERFLY STICKS BY RN, LAB AND IV TEAM. PT HAS IV ESTABLISHED ONLY. MD MADE AWARE.

## 2018-06-15 NOTE — ED Notes (Signed)
Son wants to leave phone number for update on status. Callum Wolf @ [phone].

## 2018-06-15 NOTE — H&P (Signed)
[facility] [place] NAME: [name]    MR#:  [id number]  DATE OF BIRTH:  [birth year]  DATE OF ADMISSION:  06/15/2018  PRIMARY CARE PHYSICIAN: Valerie Roys, DO   REQUESTING/REFERRING PHYSICIAN: dr Clearnce Hasten  CHIEF COMPLAINT:  patient was brought from [place] for evaluation after a fall yesterday at the facility and possible dehydration  HISTORY OF PRESENT ILLNESS:  [name]  is a 78 y.o. male with a known history of progressive advanced dementia, multiple myeloma, diabetes, failure to thrive with poor appetite and recent history of G.I. Bleed (no luminal evaluation done) comes to the emergency room after he had a fall at the facility yesterday. Details of the fall not available. No family in the room. Spoke with her son on the phone who is the healthcare power of attorney tells me they felt patient was dehydrated and brought him to the emergency room. According to the son since patient had been discharged on 828 2019 he has been progressively declining not eating well and remains lethargic most of the time.  His hemoglobin is stable. No active bleeding noted. RN notice he had tarry stools. Patient is clinically dehydrated. He is being admitted for further evaluation of management  He was noted to a temperature of 95.8. He currently has a Customer service manager.  PAST MEDICAL HISTORY:   Past Medical History:  Diagnosis Date  . Arthritis   . Cancer ([place])    multiple myaloma  . Diabetes mellitus   . Dyspnea   . 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 Hypertension   . Hypothyroidism   . Neuropathy   . Peripheral vascular disease ([place])   . Venous stasis     PAST SURGICAL HISTOIRY:   Past Surgical History:  Procedure Laterality Date  . CATARACT EXTRACTION W/PHACO Right 10/25/2016   Procedure: CATARACT EXTRACTION PHACO AND INTRAOCULAR LENS PLACEMENT (IOC)  right eye complicated;   Surgeon: Ronnell Freshwater, MD;  Location: [facility];  Service: Ophthalmology;  Laterality: Right;  Right eye Diabetic - oral meds Vision blue  . FINGER SURGERY      SOCIAL HISTORY:   Social History   Tobacco Use  . Smoking status: Former Smoker    Packs/day: 0.25    Types: Cigarettes    Last attempt to quit: 03/23/2015    Years since quitting: 3.2  . Smokeless tobacco: Current User    Types: Chew  Substance Use Topics  . Alcohol use: Not Currently    Alcohol/week: 42.0 standard drinks    Types: 42 Cans of beer per week    Comment: 5-6 every day    FAMILY HISTORY:   Family History  Problem Relation Age of Onset  . Heart disease Mother   . Alzheimer's disease Sister   . Heart disease Maternal Aunt     DRUG ALLERGIES:   Allergies  Allergen Reactions  . Metformin And Related Nausea And Vomiting    REVIEW OF SYSTEMS:  Review of Systems  Unable to perform ROS: Mental status change     MEDICATIONS AT HOME:   Prior to Admission medications   Medication Sig Start Date End Date Taking? Authorizing Provider  famotidine (PEPCID) 40 MG tablet Take 1 tablet (40 mg total) by mouth every evening. 06/06/18 08/05/18  Hillary Bow, MD  ferrous sulfate 325 (65 FE) MG tablet Take 1 tablet (325 mg total) by mouth 2 (  two) times daily with a meal. 05/21/18   Salary, Holly Bodily D, MD  folic acid (FOLVITE) 1 MG tablet Take 1 tablet (1 mg total) by mouth daily. 05/21/18   Salary, Avel Peace, MD  ipratropium-albuterol (DUONEB) 0.5-2.5 (3) MG/3ML SOLN Take 3 mLs by nebulization every 4 (four) hours as needed (shortness of breath or wheezing). 06/02/18 06/16/18  [provider]  levothyroxine (SYNTHROID, LEVOTHROID) 75 MCG tablet Take 1 tablet (75 mcg total) by mouth daily before breakfast. 05/21/18   Salary, Avel Peace, MD  megestrol (MEGACE) 40 MG tablet Take 1 tablet (40 mg total) by mouth 2 (two) times daily. 05/21/18   Salary, Avel Peace, MD  mirtazapine (REMERON) 15 MG  tablet Take 1 tablet (15 mg total) by mouth at bedtime. 05/21/18 08/19/18  Salary, Avel Peace, MD  Nutritional Supplements (ENSURE ACTIVE HIGH PROTEIN) LIQD Take 1 Can by mouth 3 (three) times daily with meals. Patient not taking: Reported on 06/03/2018 05/21/18   Salary, Holly Bodily D, MD  potassium chloride SA (K-DUR,KLOR-CON) 20 MEQ tablet Take 1 tablet (20 mEq total) by mouth 2 (two) times daily. 05/09/18   Jacquelin Hawking, NP  tamsulosin (FLOMAX) 0.4 MG CAPS capsule Take 1 capsule (0.4 mg total) by mouth daily after breakfast. 05/21/18   Salary, Avel Peace, MD  thiamine (VITAMIN B-1) 100 MG tablet Take 1 tablet (100 mg total) by mouth daily. 05/21/18   Salary, Avel Peace, MD  vitamin C (VITAMIN C) 250 MG tablet Take 1 tablet (250 mg total) by mouth 2 (two) times daily. 05/21/18   Salary, Holly Bodily D, MD      VITAL SIGNS:  Blood pressure (!) 121/93, pulse 92, temperature (!) 95.7 F (35.4 C), temperature source Rectal, resp. rate (!) 23, height '5\' 11"'  (1.803 m), weight 50 kg, SpO2 100 %.  PHYSICAL EXAMINATION:  GENERAL:  77 y.o.-year-old patient lying in the bed with no acute distress. Thin, cachectic EYES: Pupils equal, round, reactive to light and accommodation. No scleral icterus. Extraocular muscles intact.  HEENT: Head atraumatic, normocephalic. Oropharynx and nasopharynx clear. Severe dry oral mucosa NECK:  Supple, no jugular venous distention. No thyroid enlargement, no tenderness.  LUNGS: Normal breath sounds bilaterally, no wheezing, rales,rhonchi or crepitation. No use of accessory muscles of respiration.  CARDIOVASCULAR: S1, S2 normal. No murmurs, rubs, or gallops.  ABDOMEN: Soft, nontender, nondistended. Bowel sounds present. No organomegaly or mass.  EXTREMITIES: No pedal edema, cyanosis, or clubbing.  NEUROLOGIC: unable to check patient is lethargic PSYCHIATRIC: The patient is lethargic. Has severe dementia SKIN: dry skin  LABORATORY PANEL:   CBC Recent Labs  Lab 06/15/18 1805  WBC  6.4  HGB 10.6*  HCT 30.3*  PLT 147*   ------------------------------------------------------------------------------------------------------------------  Chemistries  Recent Labs  Lab 06/15/18 1805  NA 143  K 4.6  CL 111  CO2 26  GLUCOSE 137*  BUN 35*  CREATININE 0.87  CALCIUM 9.7  AST 39  ALT 41  ALKPHOS 118  BILITOT 2.1*   ------------------------------------------------------------------------------------------------------------------  Cardiac Enzymes Recent Labs  Lab 06/15/18 1805  TROPONINI <0.03   ------------------------------------------------------------------------------------------------------------------  RADIOLOGY:  Ct Head Wo Contrast  Result Date: 06/15/2018 CLINICAL DATA:  Slurred speech, acute confusion and disorientation. EXAM: CT HEAD WITHOUT CONTRAST CT CERVICAL SPINE WITHOUT CONTRAST TECHNIQUE: Multidetector CT imaging of the head and cervical spine was performed following the standard protocol without intravenous contrast. Multiplanar CT image reconstructions of the cervical spine were also generated. COMPARISON:  MR brain May 19, 2018 FINDINGS: CT HEAD FINDINGS Brain:  No evidence of acute infarction, hemorrhage, hydrocephalus, extra-axial collection or mass lesion/mass effect. There is chronic diffuse atrophy. Chronic bilateral periventricular white matter small vessel ischemic change is identified. Vascular: No hyperdense vessel or unexpected calcification. Skull: Normal. Negative for fracture or focal lesion. Sinuses/Orbits: No acute finding. Other: None. CT CERVICAL SPINE FINDINGS Alignment: Normal. Skull base and vertebrae: No acute fracture. No primary bone lesion or focal pathologic process. Soft tissues and spinal canal: No prevertebral fluid or swelling. No visible canal hematoma. Disc levels: There are degenerative joint changes with narrowed joint space and osteophyte formation. Upper chest: Negative. Other: None. IMPRESSION: No focal acute  intracranial abnormality identified. No acute fracture or dislocation of cervical spine. Electronically Signed   By: Abelardo Diesel M.D.   On: 06/15/2018 18:43   Ct Cervical Spine Wo Contrast  Result Date: 06/15/2018 CLINICAL DATA:  Slurred speech, acute confusion and disorientation. EXAM: CT HEAD WITHOUT CONTRAST CT CERVICAL SPINE WITHOUT CONTRAST TECHNIQUE: Multidetector CT imaging of the head and cervical spine was performed following the standard protocol without intravenous contrast. Multiplanar CT image reconstructions of the cervical spine were also generated. COMPARISON:  MR brain May 19, 2018 FINDINGS: CT HEAD FINDINGS Brain: No evidence of acute infarction, hemorrhage, hydrocephalus, extra-axial collection or mass lesion/mass effect. There is chronic diffuse atrophy. Chronic bilateral periventricular white matter small vessel ischemic change is identified. Vascular: No hyperdense vessel or unexpected calcification. Skull: Normal. Negative for fracture or focal lesion. Sinuses/Orbits: No acute finding. Other: None. CT CERVICAL SPINE FINDINGS Alignment: Normal. Skull base and vertebrae: No acute fracture. No primary bone lesion or focal pathologic process. Soft tissues and spinal canal: No prevertebral fluid or swelling. No visible canal hematoma. Disc levels: There are degenerative joint changes with narrowed joint space and osteophyte formation. Upper chest: Negative. Other: None. IMPRESSION: No focal acute intracranial abnormality identified. No acute fracture or dislocation of cervical spine. Electronically Signed   By: Abelardo Diesel M.D.   On: 06/15/2018 18:43    EKG:    IMPRESSION AND PLAN:   [name]  is a 77 y.o. male with a known history of progressive advanced dementia, multiple myeloma, diabetes, failure to thrive with poor appetite and recent history of G.I. Bleed (no luminal evaluation done) comes to the emergency room after he had a fall at the facility yesterday. Details of the  fall not available. No family in the room. Spoke with her son on the phone who is the healthcare power of attorney tells me they felt patient was dehydrated and brought him to the emergency room. According to the son since patient had been discharged on 828 2019 he has been progressively declining not eating well and remains lethargic most of the time.  1. acute metabolic encephalopathy secondary to poor PO intake suspected due to worsening/progressive dementia and failure to thrive -admit to medical floor -IV fluids -palliative care consultation -I spoke with patient's son Shermon Bozzi and according to him patient has not been doing well since he was discharged June 07, 2018. He has not been eating and remains lethargic. He is not been communicating as well. There was a discussion made with the family that if patient continues to progressively worsen hospice would be an option for patient. This was readdressed with patient's son over the phone and he will come tomorrow to the hospital to discuss this further. -Son confirms patient is a DNR  2. Melena -hemoglobin stable -will hold off G.I. consultation at present  3. Multiple myeloma  4. History of fall at the facility yesterday. CT head and cervical spine negative for acute trauma  5. DVT prophylaxis Lovenox  Overall poor prognosis. Son voiced understanding.  All the records are reviewed and case discussed with ED provider. Management plans discussed with the patient, family and they are in agreement.  CODE STATUS: DNR  TOTAL TIME TAKING CARE OF THIS PATIENT: *50* minutes.    Fritzi Mandes M.D on 06/15/2018 at 7:33 PM  Between 7am to 6pm - Pager - [phone]  After 6pm go to [email or web address] - password [email or web address] [facility]  Office  [phone]  CC: Primary care physician; Valerie Roys, DO

## 2018-06-15 NOTE — ED Notes (Signed)
In and out cath performed to obtain urine specimen. This was done with assistance of Gracie, Therapist, sports.

## 2018-06-15 NOTE — ED Triage Notes (Signed)
Pt in via [place] with son; pt resides at [facility].  Per son, advised per cancer center for pt to be evaluated here due to possible dehydration.  Pt w/ multiple myeloma, presents with slurred speech, acute confusion and disorientation, per son, all of these things have worsened over the last month.  Pt hypotensive and tachycardic upon arrival; this RN unable to obtain oral or axillary temperature at this time.  First nurse notified.  Pt to be roomed once triage completed.

## 2018-06-15 NOTE — Plan of Care (Signed)

## 2018-06-15 NOTE — ED Notes (Signed)
vanessa RN, aware of bed assigned

## 2018-06-16 DIAGNOSIS — C9000 Multiple myeloma not having achieved remission: Secondary | ICD-10-CM | POA: Diagnosis not present

## 2018-06-16 DIAGNOSIS — R627 Adult failure to thrive: Secondary | ICD-10-CM

## 2018-06-16 DIAGNOSIS — Z7189 Other specified counseling: Secondary | ICD-10-CM

## 2018-06-16 DIAGNOSIS — N179 Acute kidney failure, unspecified: Secondary | ICD-10-CM | POA: Diagnosis not present

## 2018-06-16 DIAGNOSIS — L219 Seborrheic dermatitis, unspecified: Secondary | ICD-10-CM | POA: Diagnosis not present

## 2018-06-16 DIAGNOSIS — Z515 Encounter for palliative care: Secondary | ICD-10-CM

## 2018-06-16 DIAGNOSIS — Z9181 History of falling: Secondary | ICD-10-CM | POA: Diagnosis not present

## 2018-06-16 LAB — MRSA PCR SCREENING: MRSA BY PCR: POSITIVE — AB

## 2018-06-16 MED ORDER — CHLORHEXIDINE GLUCONATE CLOTH 2 % EX PADS
6.0000 | MEDICATED_PAD | Freq: Every day | CUTANEOUS | Status: DC
  Administered 2018-06-16: 06:00:00 6 via TOPICAL

## 2018-06-16 MED ORDER — MUPIROCIN 2 % EX OINT
1.0000 "application " | TOPICAL_OINTMENT | Freq: Two times a day (BID) | CUTANEOUS | Status: DC
  Administered 2018-06-16 (×2): 1 via NASAL
  Filled 2018-06-16: qty 22

## 2018-06-16 MED ORDER — LEVOTHYROXINE SODIUM 75 MCG PO TABS: 75.0000 ug | ORAL_TABLET | Freq: Every day | ORAL | Status: DC

## 2018-06-16 MED ORDER — MEGESTROL ACETATE 20 MG PO TABS
40.0000 mg | ORAL_TABLET | Freq: Two times a day (BID) | ORAL | Status: DC
  Administered 2018-06-16: 40 mg via ORAL
  Filled 2018-06-16 (×2): qty 2

## 2018-06-16 MED ORDER — ORAL CARE MOUTH RINSE
15.0000 mL | Freq: Two times a day (BID) | OROMUCOSAL | Status: DC
  Administered 2018-06-16: 15 mL via OROMUCOSAL

## 2018-06-16 MED ORDER — LORAZEPAM 2 MG/ML PO CONC: 1.0000 mg | ORAL | Status: DC | PRN

## 2018-06-16 MED ORDER — IPRATROPIUM-ALBUTEROL 0.5-2.5 (3) MG/3ML IN SOLN: 3.0000 mL | RESPIRATORY_TRACT | Status: DC | PRN

## 2018-06-16 MED ORDER — TAMSULOSIN HCL 0.4 MG PO CAPS
0.4000 mg | ORAL_CAPSULE | Freq: Every day | ORAL | Status: DC
  Administered 2018-06-16: 0.4 mg via ORAL
  Filled 2018-06-16: qty 1

## 2018-06-16 MED ORDER — LORAZEPAM 2 MG/ML PO CONC: 1.0000 mg | ORAL | 0 refills | Status: DC | PRN

## 2018-06-16 MED ORDER — MIRTAZAPINE 15 MG PO TABS: 15.0000 mg | ORAL_TABLET | Freq: Every day | ORAL | Status: DC

## 2018-06-16 NOTE — NC FL2 (Signed)
[place] LEVEL OF CARE SCREENING TOOL     IDENTIFICATION  Patient Name: [name] Birthdate: November 12, 1940 Sex: male Admission Date (Current Location): 06/15/2018  [place] and [place] Number:  Engineering geologist and Address:  [facility], [address], [place], [place] [address]      Provider Number: [id number]  Attending Physician Name and Address:  Bettey Costa, MD  Relative Name and Phone Number:  [employer]- son [phone]    Current Level of Care: Hospital Recommended Level of Care: [place] Prior Approval Number:    Date Approved/Denied:   PASRR Number:    Discharge Plan: SNF    Current Diagnoses: Patient Active Problem List   Diagnosis Date Noted  . Failure to thrive (0-17) 06/15/2018  . UGIB (upper gastrointestinal bleed) 06/03/2018  . CAP (community acquired pneumonia) 05/20/2018  . Pressure injury of skin 05/19/2018  . Altered mental status   . Palliative care by specialist   . Weakness 05/18/2018  . B12 deficiency 09/26/2017  . Multiple myeloma ([employer]) 09/08/2017  . Goals of care, counseling/discussion 09/08/2017  . PAD (peripheral artery disease) ([place]) 08/08/2017  . Peripheral neuropathy 07/01/2017  . Hyponatremia 06/20/2017  . Leg cramps 03/30/2017  . Elevated alkaline phosphatase level 01/03/2017  . Elevated serum GGT level 01/03/2017  . Hypothyroidism 04/08/2015  . Benign hypertensive renal disease 04/07/2015  . Leukopenia 04/07/2015  . Type 2 diabetes, controlled, with neuropathy ([place]) 04/07/2015  . CKD stage 1 due to type 2 diabetes mellitus ([place]) 04/07/2015  . Pancytopenia ([place]) 04/07/2015    Orientation RESPIRATION BLADDER Height & Weight     Self  Normal Incontinent Weight: 110 lb 3.7 oz (50 kg) Height:  _0  (180.3 cm)  BEHAVIORAL SYMPTOMS/MOOD NEUROLOGICAL BOWEL NUTRITION STATUS  (None) (None) Incontinent Diet(NPO to be advanced )  AMBULATORY STATUS COMMUNICATION OF  NEEDS Skin   Extensive Assist Verbally Other (Comment)(Stage 2 on right Thigh, Stage 2 on Left buttocks, Stage 2 on right buttocks)                       Personal Care Assistance Level of Assistance  Bathing, Feeding, Dressing Bathing Assistance: Limited assistance Feeding assistance: Limited assistance Dressing Assistance: Maximum assistance     Functional Limitations Info  Sight, Hearing, Speech Sight Info: Adequate Hearing Info: Adequate Speech Info: Adequate    SPECIAL CARE FACTORS FREQUENCY                       Contractures Contractures Info: Not present    Additional Factors Info  Code Status, Allergies Code Status Info: DNR Allergies Info: Metformin And Related           Current Medications (06/16/2018):  This is the current hospital active medication list Current Facility-Administered Medications  Medication Dose Route Frequency Provider Last Rate Last Dose  . acetaminophen (TYLENOL) tablet 650 mg  650 mg Oral Q6H PRN Fritzi Mandes, MD       Or  . acetaminophen (TYLENOL) suppository 650 mg  650 mg Rectal Q6H PRN Fritzi Mandes, MD      . Chlorhexidine Gluconate Cloth 2 % PADS 6 each  6 each Topical Q0600 Fritzi Mandes, MD   6 each at 06/16/18 0549  . enoxaparin (LOVENOX) injection 40 mg  40 mg Subcutaneous Q24H Fritzi Mandes, MD   40 mg at 06/15/18 2120  . ipratropium-albuterol (DUONEB) 0.5-2.5 (3) MG/3ML nebulizer solution 3 mL  3 mL Nebulization Q4H PRN Bettey Costa, MD      . Derrill Memo ON 06/17/2018] levothyroxine (SYNTHROID, LEVOTHROID) tablet 75 mcg  75 mcg Oral QAC breakfast Mody, Sital, MD      . LORazepam (ATIVAN) 2 MG/ML concentrated solution 1 mg  1 mg Oral Q4H PRN Bettey Costa, MD      . MEDLINE mouth rinse  15 mL Mouth Rinse BID Fritzi Mandes, MD   15 mL at 06/16/18 1141  . megestrol (MEGACE) tablet 40 mg  40 mg Oral BID Bettey Costa, MD   40 mg at 06/16/18 1141  . mirtazapine (REMERON) tablet 15 mg  15 mg Oral QHS Mody, Sital, MD      . mupirocin ointment  (BACTROBAN) 2 % 1 application  1 application Nasal BID Fritzi Mandes, MD   1 application at 20/60/15 1142  . ondansetron (ZOFRAN) tablet 4 mg  4 mg Oral Q6H PRN Fritzi Mandes, MD       Or  . ondansetron [facility]) injection 4 mg  4 mg Intravenous Q6H PRN Fritzi Mandes, MD      . tamsulosin (FLOMAX) capsule 0.4 mg  0.4 mg Oral QPC breakfast Bettey Costa, MD   0.4 mg at 06/16/18 1141     Discharge Medications: Please see discharge summary for a list of discharge medications.  Relevant Imaging Results:  Relevant Lab Results:   Additional Information    Jayleena Stille  Louretta Shorten, LCSWA

## 2018-06-16 NOTE — Clinical Social Work Note (Signed)
Clinical Social Work Assessment  Patient Details  Name: [name] MRN: [id number] Date of Birth: Dec 08, 1940  Date of referral:  06/16/18               Reason for consult:  Facility Placement                Permission sought to share information with:  Case Manager, Customer service manager, Family Supports Permission granted to share information::  Yes, Verbal Permission Granted  Name::        Agency::     Relationship::     Contact Information:     Housing/Transportation Living arrangements for the past 2 months:  [place] of Information:  Adult Children, Facility Patient Interpreter Needed:  None Criminal Activity/Legal Involvement Pertinent to Current Situation/Hospitalization:  No - Comment as needed Significant Relationships:  Adult Children, Other Family Members Lives with:  Facility Resident Do you feel safe going back to the place where you live?  Yes Need for family participation in patient care:  Yes (Comment)  Care giving concerns:  Patient is from [place] Worker assessment / plan:  CSW noted in chart review that patient is from [facility]. CSW spoke with patient's son Jaydenn Boccio at bedside. Son states that patient has been at [facility] and he would like him to return there. Patient was asleep during interview. CSW spoke with Claiborne Billings, admissions coordinator at [facility]. Claiborne Billings states that patient has been there under insurance and could return for rehab. CSW asked if patient could return for long term care. Claiborne Billings is looking into this to see if patient could return under long term care. CSW will continue to follow for discharge planning.   Employment status:  Retired Nurse, adult PT Recommendations:  Not assessed at this time Information / Referral to community resources:     Patient/Family's Response to care:  Patient's son thanked CSW for assistance    Patient/Family's Understanding of and Emotional Response to Diagnosis, Current Treatment, and Prognosis:  Patient's son in agreement with discharge plan   Emotional Assessment Appearance:  Appears stated age Attitude/Demeanor/Rapport:  Lethargic Affect (typically observed):  Unable to Assess Orientation:  Oriented to Self Alcohol / Substance use:  Not Applicable Psych involvement (Current and /or in the community):  No (Comment)  Discharge Needs  Concerns to be addressed:  Discharge Planning Concerns Readmission within the last 30 days:  Yes Current discharge risk:  None Barriers to Discharge:  Continued Medical Work up   [employer], [place] 06/16/2018, 12:03 PM

## 2018-06-16 NOTE — Progress Notes (Addendum)
Initial Nutrition Assessment  DOCUMENTATION CODES:   Underweight, Severe malnutrition in context of chronic illness  INTERVENTION:   -RD will follow for diet advancement and supplement as appropriate -If pt desires aggressive care, consider initiation of nutrition support. Recommend:  Initiate Jevity 1.2 @ 20 ml/hr via NGT and increase by 10 ml every 12 hours to goal rate of 60 ml/hr.   Tube feeding regimen provides 1728 kcal (100% of needs), 92 grams of protein, and 1094 ml of H2O.   -If feedings are started, recommend monitoring K, Mg, and Phos daily for at least 3 days due to high refeeding risk  NUTRITION DIAGNOSIS:   Severe Malnutrition related to chronic illness(dementia) as evidenced by severe fat depletion, severe muscle depletion, percent weight loss.  GOAL:   Patient will meet greater than or equal to 90% of their needs  MONITOR:   Diet advancement, Labs, Weight trends, Skin, I & O's  REASON FOR ASSESSMENT:   Malnutrition Screening Tool    ASSESSMENT:   [name]  is a 77 y.o. male with a known history of progressive advanced dementia, multiple myeloma, diabetes, failure to thrive with poor appetite and recent history of G.I. Bleed (no luminal evaluation done) comes to the emergency room after he had a fall at the facility yesterday.   Pt admitted with acute metabolic encephalopathy and recent GIB. Pt from [facility] SNF.   Pt on low bed, in mittens at time of visit. Pt very confused at time of visit and unable to provide additional history. Pt very difficult to understand given garbled speech. No family or SNF records available to provide additional history.   Pt is NPO; awaiting speech and palliative care evaluations. Pt would greatly benefit from oral nutritional supplements and/or supplemental enteral nutrition support if pt and family continue to desire aggressive care.   Wt hx reviewed; pt has experienced a 32.6% wt loss over the past 3 months,  which is significant for time frame.   Pertinent PTA medications include megace, remeron, and Ensure.   Labs reviewed.   NUTRITION - FOCUSED PHYSICAL EXAM:    Most Recent Value  Orbital Region  Severe depletion  Upper Arm Region  Severe depletion  Thoracic and Lumbar Region  Severe depletion  Buccal Region  Severe depletion  Temple Region  Severe depletion  Clavicle Bone Region  Severe depletion  Clavicle and Acromion Bone Region  Severe depletion  Scapular Bone Region  Severe depletion  Dorsal Hand  Severe depletion  Patellar Region  Severe depletion  Anterior Thigh Region  Severe depletion  Posterior Calf Region  Severe depletion  Edema (RD Assessment)  None  Hair  Reviewed  Eyes  Reviewed  Mouth  Reviewed  Skin  Reviewed  Nails  Reviewed       Diet Order:   Diet Order            Diet NPO time specified  Diet effective now              EDUCATION NEEDS:   Not appropriate for education at this time  Skin:  Skin Assessment: Skin Integrity Issues: Skin Integrity Issues:: Stage II Stage II: rt thigh, rt buttocks, lt buttocks  Last BM:  06/15/18  Height:   Ht Readings from Last 1 Encounters:  06/15/18 5' 11" (1.803 m)    Weight:   Wt Readings from Last 1 Encounters:  06/15/18 50 kg    Ideal Body Weight:  78.2 kg  BMI:  Body  mass index is 15.37 kg/m.  Estimated Nutritional Needs:   Kcal:  1550-1750  Protein:  85-100 grams  Fluid:  > 1.6 L     A. , RD, LDN, CDE Pager: [phone] After hours Pager: [phone] 

## 2018-06-16 NOTE — Progress Notes (Signed)
Please note patient is currently followed by outpatient Palliative at [facility]. Writer was informed by CSW Annamaria Boots, that following a Palliative Medicine consult today, family has chosen  to have hospice services at [facility]. Plan is for discharge today. Updated notes faxed to referral.  Flo Shanks RN, BSN, [facility] [place], hospital Liaison [phone]

## 2018-06-16 NOTE — Progress Notes (Signed)
Please note patient is currently followed by outpatient Palliative at [facility]. CSW Annamaria Boots made aware. Flo Shanks RN, BSN, [place] [facility] [place], [facility] [phone]

## 2018-06-16 NOTE — Progress Notes (Addendum)
[place] [facility] [name] NAME: [name]    MR#:  [id number]  DATE OF BIRTH:  1941-02-07  SUBJECTIVE:   Patient lethargic No acute events overnight He is wearing mitts due to agitation  REVIEW OF SYSTEMS:   Unable to obtain due to lethargy/mental status   Tolerating Diet: npo      DRUG ALLERGIES:   Allergies  Allergen Reactions  . Metformin And Related Nausea And Vomiting    VITALS:  Blood pressure 130/60, pulse 96, temperature (!) 97.2 F (36.2 C), temperature source Axillary, resp. rate 16, height 5\' 11"  (1.803 m), weight 50 kg, SpO2 99 %.  PHYSICAL EXAMINATION:  Constitutional: Appears frail and chronically ill-appearing  Lethargic  Moves all extremities while in bed  hENT: Normocephalic. Marland Kitchen Oropharynx is dry   eyes: Conjunctivae are normal. no scleral icterus.  Neck: Normal ROM. Neck supple. No JVD. No tracheal deviation. CVS: RRR, S1/S2 +, no murmurs, no gallops, no carotid bruit.  Pulmonary: Effort and breath sounds normal, no stridor, rhonchi, wheezes, rales.  Abdominal: Soft. BS +,  no distension, tenderness, rebound or guarding.  Musculoskeletal: Moves all extremities no edema and no tenderness.  Neuro: Lethargic does not follow any commands Skin: stage 2 decub. Psychiatric: Lethargic with dementia     LABORATORY PANEL:   CBC Recent Labs  Lab 06/15/18 1805  WBC 6.4  HGB 10.6*  HCT 30.3*  PLT 147*   ------------------------------------------------------------------------------------------------------------------  Chemistries  Recent Labs  Lab 06/15/18 1805  NA 143  K 4.6  CL 111  CO2 26  GLUCOSE 137*  BUN 35*  CREATININE 0.87  CALCIUM 9.7  AST 39  ALT 41  ALKPHOS 118  BILITOT 2.1*   ------------------------------------------------------------------------------------------------------------------  Cardiac Enzymes Recent Labs  Lab 06/15/18 1805  TROPONINI <0.03    ------------------------------------------------------------------------------------------------------------------  RADIOLOGY:  Dg Chest 1 View  Result Date: 06/15/2018 CLINICAL DATA:  Altered mental status EXAM: CHEST  1 VIEW COMPARISON:  June 03, 2018 FINDINGS: The heart size and mediastinal contours are within normal limits. There is no focal infiltrate, pulmonary edema, or pleural effusion. The lungs are hyperinflated. The visualized skeletal structures are stable. IMPRESSION: No active cardiopulmonary disease. Electronically Signed   By: Abelardo Diesel M.D.   On: 06/15/2018 19:39   Ct Head Wo Contrast  Result Date: 06/15/2018 CLINICAL DATA:  Slurred speech, acute confusion and disorientation. EXAM: CT HEAD WITHOUT CONTRAST CT CERVICAL SPINE WITHOUT CONTRAST TECHNIQUE: Multidetector CT imaging of the head and cervical spine was performed following the standard protocol without intravenous contrast. Multiplanar CT image reconstructions of the cervical spine were also generated. COMPARISON:  MR brain May 19, 2018 FINDINGS: CT HEAD FINDINGS Brain: No evidence of acute infarction, hemorrhage, hydrocephalus, extra-axial collection or mass lesion/mass effect. There is chronic diffuse atrophy. Chronic bilateral periventricular white matter small vessel ischemic change is identified. Vascular: No hyperdense vessel or unexpected calcification. Skull: Normal. Negative for fracture or focal lesion. Sinuses/Orbits: No acute finding. Other: None. CT CERVICAL SPINE FINDINGS Alignment: Normal. Skull base and vertebrae: No acute fracture. No primary bone lesion or focal pathologic process. Soft tissues and spinal canal: No prevertebral fluid or swelling. No visible canal hematoma. Disc levels: There are degenerative joint changes with narrowed joint space and osteophyte formation. Upper chest: Negative. Other: None. IMPRESSION: No focal acute intracranial abnormality identified. No acute fracture or dislocation of  cervical spine. Electronically Signed   By: Abelardo Diesel M.D.   On: 06/15/2018 18:43  Ct Cervical Spine Wo Contrast  Result Date: 06/15/2018 CLINICAL DATA:  Slurred speech, acute confusion and disorientation. EXAM: CT HEAD WITHOUT CONTRAST CT CERVICAL SPINE WITHOUT CONTRAST TECHNIQUE: Multidetector CT imaging of the head and cervical spine was performed following the standard protocol without intravenous contrast. Multiplanar CT image reconstructions of the cervical spine were also generated. COMPARISON:  MR brain May 19, 2018 FINDINGS: CT HEAD FINDINGS Brain: No evidence of acute infarction, hemorrhage, hydrocephalus, extra-axial collection or mass lesion/mass effect. There is chronic diffuse atrophy. Chronic bilateral periventricular white matter small vessel ischemic change is identified. Vascular: No hyperdense vessel or unexpected calcification. Skull: Normal. Negative for fracture or focal lesion. Sinuses/Orbits: No acute finding. Other: None. CT CERVICAL SPINE FINDINGS Alignment: Normal. Skull base and vertebrae: No acute fracture. No primary bone lesion or focal pathologic process. Soft tissues and spinal canal: No prevertebral fluid or swelling. No visible canal hematoma. Disc levels: There are degenerative joint changes with narrowed joint space and osteophyte formation. Upper chest: Negative. Other: None. IMPRESSION: No focal acute intracranial abnormality identified. No acute fracture or dislocation of cervical spine. Electronically Signed   By: Abelardo Diesel M.D.   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 intake and a fall.  1.  Acute metabolic encephalopathy in the setting of progressive dementia as well as decreased p.o. intake and adult failure to thrive Continue IV fluids Await palliative care consultation  2.  History of fall: Work-up has been negative including CT head and cervical spine   3.   Progressive dementia: We will await the palliative care consultation for possible placement to hospice home Continue Megace and Remeron  4.  Hypothyroidism: Continue Synthroid  5. BPH: Patient is on Flomax  6.  Chronic anemia: Hemoglobin remained stable  7. Stage 2 decub ulcer: wound care consult   CODE STATUS: DNR  TOTAL TIME TAKING CARE OF THIS PATIENT: 22 minutes.     POSSIBLE D/C ??, DEPENDING ON CLINICAL CONDITION.   Safwan Tomei M.D on 06/16/2018 at 10:03 AM  Between 7am to 6pm - Pager - [phone] After 6pm go to [email or web address] - password [email or web address] [facility]  Office  [phone]  CC: Primary care physician; Valerie Roys, DO  Note: This dictation was prepared with Dragon dictation along with smaller phrase technology. Any transcriptional errors that result from this process are unintentional.

## 2018-06-16 NOTE — Discharge Summary (Signed)
[place] [facility] [place] NAME: [name]    MR#:  [id number]  DATE OF BIRTH:  [birth year]  DATE OF ADMISSION:  06/15/2018 ADMITTING PHYSICIAN: Fritzi Mandes, MD  DATE OF DISCHARGE: 06/16/2018  PRIMARY CARE PHYSICIAN: Valerie Roys, DO    ADMISSION DIAGNOSIS:  Altered mental status, unspecified altered mental status type [R41.82] Gastrointestinal hemorrhage, unspecified gastrointestinal hemorrhage type [K92.2]  DISCHARGE DIAGNOSIS:  Active Problems:   Failure to thrive (0-17)   SECONDARY DIAGNOSIS:   Past Medical History:  Diagnosis Date  . Arthritis   . 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 Diabetes mellitus   . Dyspnea   . 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 Hypertension   . Hypothyroidism   . Neuropathy   . Peripheral vascular disease ([place])   . Venous stasis     HOSPITAL COURSE:  77 year old male with progressive dementia who presents with poor p.o. intake and a fall.  1.  Acute metabolic encephalopathy in the setting of progressive dementia as well as decreased p.o. intake and adult failure to thrive HE will be discharged with hospice  2.  History of fall: Work-up has been negative including CT head and cervical spine   3.  Progressive dementia: 4.  Hypothyroidism:  5. BPH:  6.  Chronic anemia: Hemoglobin remained stable  7. Stage 2 decub ulcer:   DISCHARGE CONDITIONS AND DIET:  Guarded Diet as tolerated  CONSULTS OBTAINED:    DRUG ALLERGIES:   Allergies  Allergen Reactions  . Metformin And Related Nausea And Vomiting    DISCHARGE MEDICATIONS:   Allergies as of 06/16/2018      Reactions   Metformin And Related Nausea And Vomiting      Medication List    STOP taking these medications   ascorbic acid 250 MG tablet Commonly known as:  VITAMIN C   ENSURE ACTIVE HIGH PROTEIN Liqd   famotidine 40 MG tablet Commonly known as:  PEPCID   ferrous sulfate 325  (65 FE) MG tablet   folic acid 1 MG tablet Commonly known as:  FOLVITE   ipratropium-albuterol 0.5-2.5 (3) MG/3ML Soln Commonly known as:  DUONEB   levothyroxine 75 MCG tablet Commonly known as:  SYNTHROID, LEVOTHROID   megestrol 40 MG tablet Commonly known as:  MEGACE   mirtazapine 15 MG tablet Commonly known as:  REMERON   potassium chloride SA 20 MEQ tablet Commonly known as:  K-DUR,KLOR-CON   tamsulosin 0.4 MG Caps capsule Commonly known as:  FLOMAX   thiamine 100 MG tablet Commonly known as:  VITAMIN B-1     TAKE these medications   LORazepam 2 MG/ML concentrated solution Commonly known as:  ATIVAN Take 1 mg by mouth every 4 (four) hours as needed for anxiety.         Today   CHIEF COMPLAINT:  lethargic  VITAL SIGNS:  Blood pressure 130/60, pulse 96, temperature (!) 97.2 F (36.2 C), temperature source Axillary, resp. rate 16, height 5\' 11"  (1.803 m), weight 50 kg, SpO2 99 %.   REVIEW OF SYSTEMS:  Review of Systems  Unable to perform ROS: Dementia     PHYSICAL EXAMINATION:  GENERAL:  77 y.o.-year-old patient lying in the bed with no acute distress. Ill appearing lethargic   DATA REVIEW:   CBC Recent Labs  Lab 06/15/18 1805  WBC 6.4  HGB 10.6*  HCT 30.3*  PLT 147*  Chemistries  Recent Labs  Lab 06/15/18 1805  NA 143  K 4.6  CL 111  CO2 26  GLUCOSE 137*  BUN 35*  CREATININE 0.87  CALCIUM 9.7  AST 39  ALT 41  ALKPHOS 118  BILITOT 2.1*    Cardiac Enzymes Recent Labs  Lab 06/15/18 1805  TROPONINI <0.03    Microbiology Results  @[email or web address] @  RADIOLOGY:  Dg Chest 1 View  Result Date: 06/15/2018 CLINICAL DATA:  Altered mental status EXAM: CHEST  1 VIEW COMPARISON:  June 03, 2018 FINDINGS: The heart size and mediastinal contours are within normal limits. There is no focal infiltrate, pulmonary edema, or pleural effusion. The lungs are hyperinflated. The visualized skeletal structures are stable. IMPRESSION: No  active cardiopulmonary disease. Electronically Signed   By: Abelardo Diesel M.D.   On: 06/15/2018 19:39   Ct Head Wo Contrast  Result Date: 06/15/2018 CLINICAL DATA:  Slurred speech, acute confusion and disorientation. EXAM: CT HEAD WITHOUT CONTRAST CT CERVICAL SPINE WITHOUT CONTRAST TECHNIQUE: Multidetector CT imaging of the head and cervical spine was performed following the standard protocol without intravenous contrast. Multiplanar CT image reconstructions of the cervical spine were also generated. COMPARISON:  MR brain May 19, 2018 FINDINGS: CT HEAD FINDINGS Brain: No evidence of acute infarction, hemorrhage, hydrocephalus, extra-axial collection or mass lesion/mass effect. There is chronic diffuse atrophy. Chronic bilateral periventricular white matter small vessel ischemic change is identified. Vascular: No hyperdense vessel or unexpected calcification. Skull: Normal. Negative for fracture or focal lesion. Sinuses/Orbits: No acute finding. Other: None. CT CERVICAL SPINE FINDINGS Alignment: Normal. Skull base and vertebrae: No acute fracture. No primary bone lesion or focal pathologic process. Soft tissues and spinal canal: No prevertebral fluid or swelling. No visible canal hematoma. Disc levels: There are degenerative joint changes with narrowed joint space and osteophyte formation. Upper chest: Negative. Other: None. IMPRESSION: No focal acute intracranial abnormality identified. No acute fracture or dislocation of cervical spine. Electronically Signed   By: Abelardo Diesel M.D.   On: 06/15/2018 18:43   Ct Cervical Spine Wo Contrast  Result Date: 06/15/2018 CLINICAL DATA:  Slurred speech, acute confusion and disorientation. EXAM: CT HEAD WITHOUT CONTRAST CT CERVICAL SPINE WITHOUT CONTRAST TECHNIQUE: Multidetector CT imaging of the head and cervical spine was performed following the standard protocol without intravenous contrast. Multiplanar CT image reconstructions of the cervical spine were also  generated. COMPARISON:  MR brain May 19, 2018 FINDINGS: CT HEAD FINDINGS Brain: No evidence of acute infarction, hemorrhage, hydrocephalus, extra-axial collection or mass lesion/mass effect. There is chronic diffuse atrophy. Chronic bilateral periventricular white matter small vessel ischemic change is identified. Vascular: No hyperdense vessel or unexpected calcification. Skull: Normal. Negative for fracture or focal lesion. Sinuses/Orbits: No acute finding. Other: None. CT CERVICAL SPINE FINDINGS Alignment: Normal. Skull base and vertebrae: No acute fracture. No primary bone lesion or focal pathologic process. Soft tissues and spinal canal: No prevertebral fluid or swelling. No visible canal hematoma. Disc levels: There are degenerative joint changes with narrowed joint space and osteophyte formation. Upper chest: Negative. Other: None. IMPRESSION: No focal acute intracranial abnormality identified. No acute fracture or dislocation of cervical spine. Electronically Signed   By: Abelardo Diesel M.D.   On: 06/15/2018 18:43      Allergies as of 06/16/2018      Reactions   Metformin And Related Nausea And Vomiting      Medication List    STOP taking these medications   ascorbic acid 250 MG tablet Commonly known as:  VITAMIN C   ENSURE ACTIVE HIGH PROTEIN Liqd   famotidine 40 MG tablet Commonly known as:  PEPCID   ferrous sulfate 325 (65 FE) MG tablet   folic acid 1 MG tablet Commonly known as:  FOLVITE   ipratropium-albuterol 0.5-2.5 (3) MG/3ML Soln Commonly known as:  DUONEB   levothyroxine 75 MCG tablet Commonly known as:  SYNTHROID, LEVOTHROID   megestrol 40 MG tablet Commonly known as:  MEGACE   mirtazapine 15 MG tablet Commonly known as:  REMERON   potassium chloride SA 20 MEQ tablet Commonly known as:  K-DUR,KLOR-CON   tamsulosin 0.4 MG Caps capsule Commonly known as:  FLOMAX   thiamine 100 MG tablet Commonly known as:  VITAMIN B-1     TAKE these medications    LORazepam 2 MG/ML concentrated solution Commonly known as:  ATIVAN Take 1 mg by mouth every 4 (four) hours as needed for anxiety.           Stable for discharge with hospice and SNF  Patient should follow up with hospice  CODE STATUS:     Code Status Orders  (From admission, onward)         Start     Ordered   06/15/18 2057  Do not attempt resuscitation (DNR)  Continuous    Question Answer Comment  In the event of cardiac or respiratory ARREST Do not call a "code blue"   In the event of cardiac or respiratory ARREST Do not perform Intubation, CPR, defibrillation or ACLS   In the event of cardiac or respiratory ARREST Use medication by any route, position, wound care, and other measures to relive pain and suffering. May use oxygen, suction and manual treatment of airway obstruction as needed for comfort.      06/15/18 2056        Code Status History    Date Active Date Inactive Code Status Order ID Comments User Context   06/06/2018 0940 06/07/2018 1516 DNR [id number]  Jimmy Footman, NP Inpatient   06/03/2018 0942 06/06/2018 0940 Full Code [id number]  Hillary Bow, MD ED   05/19/2018 0002 05/21/2018 1447 Full Code [id number]  Harrie Foreman, MD ED   06/20/2017 1230 06/21/2017 1613 Full Code [id number]  Gladstone Lighter, MD Inpatient      TOTAL TIME TAKING CARE OF THIS PATIENT: 38 minutes.    Note: This dictation was prepared with Dragon dictation along with smaller phrase technology. Any transcriptional errors that result from this process are unintentional.  Joshau Code M.D on 06/16/2018 at 1:01 PM  Between 7am to 6pm - Pager - [phone] After 6pm go to [email or web address] - password [email or web address] [facility]  Office  [phone]  CC: Primary care physician; Valerie Roys, DO

## 2018-06-16 NOTE — Evaluation (Signed)
Clinical/Bedside Swallow Evaluation Patient Details  Name: [name] MRN: [id number] Date of Birth: 09/13/1941  Today's Date: 06/16/2018 Time: SLP Start Time (ACUTE ONLY): 25 SLP Stop Time (ACUTE ONLY): 1150 SLP Time Calculation (min) (ACUTE ONLY): 60 min  Past Medical History:  Past Medical History:  Diagnosis Date  . Arthritis   . 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 Diabetes mellitus   . Dyspnea   . 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 Hypertension   . Hypothyroidism   . Neuropathy   . Peripheral vascular disease ([place])   . Venous stasis    Past Surgical History:  Past Surgical History:  Procedure Laterality Date  . CATARACT EXTRACTION W/PHACO Right 10/25/2016   Procedure: CATARACT EXTRACTION PHACO AND INTRAOCULAR LENS PLACEMENT (IOC)  right eye complicated;  Surgeon: Ronnell Freshwater, MD;  Location: [facility];  Service: Ophthalmology;  Laterality: Right;  Right eye Diabetic - oral meds Vision blue  . FINGER SURGERY     HPI:  Pt is a 77 y.o. male with a known history of progressive advanced Dementia, multiple myeloma, diabetes, failure to thrive with poor appetite, and recent history of G.I. Bleed (no luminal evaluation done) comes to the emergency room after he had a fall at the facility yesterday. Details of the fall not available. No family in the room. Spoke with her son on the phone who is the healthcare power of attorney tells me they felt patient was dehydrated and brought him to the emergency room. According to the son since patient had been discharged last month, pt has been progressively declining not eating well and remains lethargic most of the time. During last admission, Son/family were not fully aware of extent of pt's medical status (per notes). Pt resides at [facility].  Assessment / Plan / Recommendation Clinical Impression  Pt appears to present w/ oral phase dysphagia impacted by his declined Cognitive  status/advancing Dementia suspect reducing his awareness and orientation to the task of oral intake. Noted pt exhibited decreased awareness using the straw as well as attending to the presentation of the utensil to strip of the food/bolus which required tactile cues to the lips. Pt exhibited increased lingual movements/smacking to orally clear but was able to do so adequately given time. Pt consumed trials of thin liquids via Straw w/ no overt s/s of aspiration noted; often using multiple sips at a time. No apparent decline in respiratory status was noted during/post trials. Pt was mostly nonverbal so unable to assess vocal quality but no wet quality was noted during the garbled speech. Of note, mild+ lingual protrusion occurred during f/u swallowing as he cleared mouth(neurological presentation). He was agreeable to trials w/ SLP when offered but again required tactile/verbal cues w/ straw/utensil at lips. Pt required total assistance w/ feeding d/t declined Cognitive status. No unilateral weakness noted during OM presentation w/ bolus trials. Pt does appear at min increased risk for choking/aspiration d/t declined Cognitive awareness. Recommend a Dysphagia level 1 w/ Thin liquids; aspiration precautions; Pills Crushed in Puree; full feeding assistance at all meals. Recommend Dietician f/u for drink supplement(form). ST services can be available for further education as needed while admitted. Suspect this diet consistency would be best at discharge for pt's safe nutritional needs. NSG updated.  SLP Visit Diagnosis: Dysphagia, oropharyngeal phase (R13.12)    Aspiration Risk  Mild aspiration risk    Diet Recommendation  Dysphagia level 1 (PUREE) w/  Thin liquids; aspiration precautions; feeding support at all meals giving verbal/tactile cues to support  Medication Administration: Crushed with puree    Other  Recommendations Recommended Consults: (Dietician f/u) Oral Care Recommendations: Oral care  BID;Staff/trained caregiver to provide oral care Other Recommendations: (n/a)   Follow up Recommendations Skilled Nursing facility      Frequency and Duration (n/a)  (n/a)       Prognosis Prognosis for Safe Diet Advancement: Fair Barriers to Reach Goals: Severity of deficits;Time post onset;Cognitive deficits      Swallow Study   General Date of Onset: 06/15/18 HPI: Pt is a 77 y.o. male with a known history of progressive advanced Dementia, multiple myeloma, diabetes, failure to thrive with poor appetite, and recent history of G.I. Bleed (no luminal evaluation done) comes to the emergency room after he had a fall at the facility yesterday. Details of the fall not available. No family in the room. Spoke with her son on the phone who is the healthcare power of attorney tells me they felt patient was dehydrated and brought him to the emergency room. According to the son since patient had been discharged last month, pt has been progressively declining not eating well and remains lethargic most of the time. During last admission, Son/family were not fully aware of extent of pt's medical status (per notes).  Type of Study: Bedside Swallow Evaluation Previous Swallow Assessment: 06/05/18; 05/20/18 Diet Prior to this Study: NPO(had been rec'd a Dysphagia 1 w/ Nectar last admission) Temperature Spikes Noted: No(wbc 6.4) Respiratory Status: Room air History of Recent Intubation: No Behavior/Cognition: Confused;Agitated;Distractible;Requires cueing;Doesn't follow directions(awakened briefly but kept eyes closed often) Oral Cavity Assessment: (difficult to perform d/t Cognitive status) Oral Care Completed by SLP: Recent completion by staff Oral Cavity - Dentition: Edentulous(it appeared) Vision: (n/a) Self-Feeding Abilities: Total assist Patient Positioning: Postural control adequate for testing(head forwad; pt contracted on side) Baseline Vocal Quality: Low vocal intensity(garbled  speech) Volitional Cough: Cognitively unable to elicit Volitional Swallow: Unable to elicit    Oral/Motor/Sensory Function Overall Oral Motor/Sensory Function: (uanble to fully assess; noted lingual protrusion swallowing) Facial Symmetry: Within Functional Limits   Ice Chips Ice chips: Not tested   Thin Liquid Thin Liquid: Impaired(grossly; decreased awareness when using straw) Presentation: Straw(fed; ~5 ozs ) Oral Phase Impairments: Poor awareness of bolus(upon accepting the straw and drinking initially) Oral Phase Functional Implications: (none) Pharyngeal  Phase Impairments: (none, but noted lingual protrusion during smacking, swallow) Other Comments: somewhat decreased awareness using the straw requiring tactile cues to the lips; noted lingual protrusion during f/u swallowing    Nectar Thick Nectar Thick Liquid: Not tested   Honey Thick Honey Thick Liquid: Not tested   Puree Puree: Impaired(similar to trials of thin liquids) Presentation: Spoon(fed; 6 trials) Oral Phase Impairments: Poor awareness of bolus(when pulling bolus material from spoon) Oral Phase Functional Implications: (none) Pharyngeal Phase Impairments: (none) Other Comments: somewhat decreased awareness attending to utensil, and stripping utensil, requiring tactile cues to the lips; noted lingual protrusion during f/u swallowing   Solid     Solid: Not tested       Orinda Kenner, MS, CCC-SLP Watson,Katherine 06/16/2018,1:37 PM

## 2018-06-16 NOTE — Clinical Social Work Note (Signed)
Patient is medically ready for discharge back to [facility] with Hospice services today. CSW notified patient's son [name] of discharge back to facility today. CSW also notified Claiborne Billings, admissions coordinator of discharge today. Patient will be transported by EMS. RN to call report and call for EMS.   [place], [place]

## 2018-06-16 NOTE — Consult Note (Signed)
Consultation Note Date: 06/16/2018   Patient Name: [name]  DOB: 1941-06-25  MRN: [id number]  Age / Sex: 77 y.o., male  PCP: Valerie Roys, DO Referring Physician: Bettey Costa, MD  Reason for Consultation: Establishing goals of care and Hospice Evaluation  HPI/Patient Profile: 77 y.o. male  with past medical history of dementia, multiple myeloma, hypertension, neuropathy, PVD, diabetes, ETOH abuse, and noncompliance admitted on 06/15/2018 after a fall at [facility]. CT head and cervical spine negative for acute trauma. Patient diagnosed acute metabolic encephalopathy secondary to poor PO intake suspected due to worsening/progressive dementia and failure to thrive. Also found with melena. Patient just discharged 8/28 after a 4 day admission for coffee ground emesis.  PMT consulted for [place] due to patient's failure to thrive.   Clinical Assessment and Goals of Care: I have reviewed medical records including EPIC notes, labs and imaging, received report from RN, assessed the patient and then met at the bedside along with patient's sister and son, [name], to discuss diagnosis prognosis, GOC, EOL wishes, disposition and options.  I reintroduced Palliative Medicine as specialized medical care for people living with serious illness. It focuses on providing relief from the symptoms and stress of a serious illness. The goal is to improve quality of life for both the patient and the family.  This patient is well known to the palliative team. We have met with this family multiple times and learned about them through previous discussions. It has been reported that the patient's son has cognitive impairments and is unable to read or write. He has had difficulty making decisions for the patient in the past. The patient's sister's daughter-in-law Junie Panning) has been a great source of support for the family and attended previous goals of care meetings to assist  with decisions.   Today, I spoke with [name] about his father's continued decline. [name] admits that he is not doing well. He tells how he hoped he would start walking and eating again, but that has not happened. 40 pound weight loss over the past month noted per chart review. We discussed that patient's recurrent hospitalizations due to his decline.   The difference between aggressive medical intervention and comfort care was considered and [name] agrees to Hospice care in the future. Confirmed DNR.   [name] gave me permission to call Junie Panning to discuss the situation. Junie Panning also talks about the patient's obvious decline and agrees that hospice care is best for the patient moving forward. She talks about last palliative family meeting during previous hospitalization and how patient stated he wanted to be DNR and also wanted [name] to make his decisions.   Questions and concerns were addressed. The family was encouraged to call with questions or concerns.   Discussed the above with Education officer, museum and MD.   Primary Decision Maker NEXT OF KIN - ? - patient is not decisional, during the last family meeting he stated that he wanted his son to serve as his surrogate decision maker. Junie Panning is the patient's sister's DIL and she is supporting [name] through decision making.    No HCPOA documented.   SUMMARY OF RECOMMENDATIONS   Return to facility with hospice care MOST form would be beneficial but [name] unable to complete that at this time DNR status confirmed during last [place] meeting with family  Code Status/Advance Care Planning:  DNR  Symptom Management:   Per primary  Psycho-social/Spiritual:   Desire for further Chaplaincy support:no  Additional Recommendations: Education on Hospice  Prognosis:  Unable to determine - poor prognosis r/t failure to thrive, poor functional status, minimal PO intake  Discharge Planning: [place] with Hospice      Primary  Diagnoses: Present on Admission: . Failure to thrive (0-17)   I have reviewed the medical record, interviewed the patient and family, and examined the patient. The following aspects are pertinent.  Past Medical History:  Diagnosis Date  . Arthritis   . Cancer ([place])    multiple myaloma  . Diabetes mellitus   . Dyspnea   . 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 Hypertension   . Hypothyroidism   . Neuropathy   . Peripheral vascular disease ([place])   . Venous stasis    Social History   Socioeconomic History  . Marital status: Married    Spouse name: unknown  . Number of children: 2  . Years of education: Not on file  . Highest education level: Not on file  Occupational History  . Occupation: Custodian  Social Needs  . Financial resource strain: Patient refused  . Food insecurity:    Worry: Patient refused    Inability: Patient refused  . Transportation needs:    Medical: Patient refused    Non-medical: Patient refused  Tobacco Use  . Smoking status: Former Smoker    Packs/day: 0.25    Types: Cigarettes    Last attempt to quit: 03/23/2015    Years since quitting: 3.2  . Smokeless tobacco: Current User    Types: Chew  Substance and Sexual Activity  . Alcohol use: Not Currently    Alcohol/week: 42.0 standard drinks    Types: 42 Cans of beer per week    Comment: not currently  . Drug use: No  . Sexual activity: Not Currently  Lifestyle  . Physical activity:    Days per week: Not on file    Minutes per session: Not on file  . Stress: Not on file  Relationships  . Social connections:    Talks on phone: Patient refused    Gets together: Patient refused    Attends religious service: Patient refused    Active member of club or organization: Patient refused    Attends meetings of clubs or organizations: Patient refused    Relationship status: Patient refused  Other Topics Concern  . Not on file  Social History Narrative    Lives at home with son, ambulates with a cane   Family History  Problem Relation Age of Onset  . Heart disease Mother   . Alzheimer's disease Sister   . Heart disease Maternal Aunt    Scheduled Meds: . Chlorhexidine Gluconate Cloth  6 each Topical Q0600  . enoxaparin (LOVENOX) injection  40 mg Subcutaneous Q24H  . [START ON 06/17/2018] levothyroxine  75 mcg Oral QAC breakfast  . mouth rinse  15 mL Mouth Rinse BID  . megestrol  40 mg Oral BID  . mirtazapine  15 mg Oral QHS  . mupirocin ointment  1 application Nasal BID  . tamsulosin  0.4 mg Oral QPC breakfast   Continuous Infusions: PRN Meds:.acetaminophen **OR** acetaminophen, ipratropium-albuterol, LORazepam, ondansetron **OR** ondansetron (ZOFRAN) IV Allergies  Allergen Reactions  . Metformin And Related Nausea And Vomiting   Review of Systems  Unable to perform ROS: Dementia    Physical Exam  Constitutional: He appears lethargic. He appears cachectic. He is uncooperative. He has a sickly appearance. No distress.  HENT:  Head: Normocephalic and atraumatic.  Cardiovascular: Normal rate and regular rhythm.  Pulmonary/Chest: Effort normal. No respiratory distress.  Musculoskeletal: He exhibits no edema.  Neurological: He appears lethargic. He is disoriented.  Skin: Skin is dry.  Psychiatric: Cognition and memory are impaired.    Vital Signs: BP 130/60 (BP Location: Left Arm)   Pulse 96   Temp (!) 97.2 F (36.2 C) (Axillary)   Resp 16   Ht _0  (1.803 m)   Wt 50 kg   SpO2 99%   BMI 15.37 kg/m  Pain Scale: PAINAD   Pain Score: 0-No pain   SpO2: SpO2: 99 % O2 Device:SpO2: 99 % O2 Flow Rate: .   IO: Intake/output summary:   Intake/Output Summary (Last 24 hours) at 06/16/2018 1312 Last data filed at 06/15/2018 2152 Gross per 24 hour  Intake 28.02 ml  Output -  Net 28.02 ml    LBM: Last BM Date: 06/15/18 Baseline Weight: Weight: 50 kg Most recent weight: Weight: 50 kg     Palliative Assessment/Data: PPS  20%      Time Total: 70 minutes Greater than 50%  of this time was spent counseling and coordinating care related to the above assessment and plan.  Juel Burrow, DNP, AGNP-C Palliative Medicine Team [phone] Pager: [phone]

## 2018-06-19 NOTE — Progress Notes (Deleted)
[facility]  [phone] [phone]  ID: [name] OB: [birth year]  MR#: [id number]  GUY#:[id number]  Patient Care Team: Valerie Roys, DO as PCP - General (Family Medicine)  CHIEF COMPLAINT: Multiple myeloma.  INTERVAL HISTORY: Patient returns to clinic today for repeat laboratory work and further evaluation.  Performance status is declining.  He continues to complain of chronic pain.  He has worsening weakness and fatigue.  He has a poor appetite and continues to lose weight.  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  He denies any fevers.  He denies any easy bleeding or bruising. He denies any chest pain or shortness of breath. He has no nausea, vomiting, constipation, or diarrhea. He has no urinary complaints.  Patient continues to feel terrible, but offers no further specific complaints.  REVIEW OF SYSTEMS:   Review of Systems  Constitutional: Positive for malaise/fatigue and weight loss. Negative for fever.  Respiratory: Negative.  Negative for cough and shortness of breath.   Cardiovascular: Negative.  Negative for chest pain and leg swelling.  Gastrointestinal: Negative.  Negative for abdominal pain, blood in stool and melena.  Genitourinary: Negative.   Musculoskeletal: Positive for falls.  Skin: Negative.  Negative for rash.  Neurological: Positive for dizziness, tingling, sensory change and weakness. Negative for focal weakness.  Endo/Heme/Allergies: Negative.  Does not bruise/bleed easily.  Psychiatric/Behavioral: Positive for substance abuse. The patient is not nervous/anxious.     As per HPI. Otherwise, a complete review of systems is negative.  PAST MEDICAL HISTORY: Past Medical History:  Diagnosis Date  . Arthritis   . Cancer ([place])    multiple myaloma  . Diabetes mellitus   . Dyspnea   . 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 Hypertension   . Hypothyroidism   . Neuropathy     . Peripheral vascular disease ([place])   . Venous stasis     PAST SURGICAL HISTORY: Past Surgical History:  Procedure Laterality Date  . CATARACT EXTRACTION W/PHACO Right 10/25/2016   Procedure: CATARACT EXTRACTION PHACO AND INTRAOCULAR LENS PLACEMENT (IOC)  right eye complicated;  Surgeon: Ronnell Freshwater, MD;  Location: [facility];  Service: Ophthalmology;  Laterality: Right;  Right eye Diabetic - oral meds Vision blue  . FINGER SURGERY      FAMILY HISTORY Family History  Problem Relation Age of Onset  . Heart disease Mother   . Alzheimer's disease Sister   . Heart disease Maternal Aunt        ADVANCED DIRECTIVES:    HEALTH MAINTENANCE: Social History   Tobacco Use  . Smoking status: Former Smoker    Packs/day: 0.25    Types: Cigarettes    Last attempt to quit: 03/23/2015    Years since quitting: 3.2  . Smokeless tobacco: Current User    Types: Chew  Substance Use Topics  . Alcohol use: Not Currently    Alcohol/week: 42.0 standard drinks    Types: 42 Cans of beer per week    Comment: not currently  . Drug use: No     Colonoscopy:  PAP:  Bone density:  Lipid panel:  Allergies  Allergen Reactions  . Metformin And Related Nausea And Vomiting    Current Outpatient Medications  Medication Sig Dispense Refill  . LORazepam (ATIVAN) 2 MG/ML concentrated solution Take 0.5 mLs (1 mg total) by mouth every 4 (four) hours as needed for anxiety. 30 mL 0   No  current facility-administered medications for this visit.     OBJECTIVE: There were no vitals filed for this visit.   There is no height or weight on file to calculate BMI.    ECOG FS:0 - Asymptomatic  General: Disheveled appearing, no acute distress. Eyes: Pink conjunctiva, anicteric sclera. HEENT: Normocephalic, moist mucous membranes. Lungs: Clear to auscultation bilaterally. Heart: Regular rate and rhythm. No rubs, murmurs, or gallops. Abdomen: Soft, nontender, nondistended. No  organomegaly noted, normoactive bowel sounds. Musculoskeletal: No edema, cyanosis, or clubbing. Neuro: Alert, answering all questions appropriately. Cranial nerves grossly intact. Skin: No rashes or petechiae noted. Psych: Normal affect.  LAB RESULTS:  Lab Results  Component Value Date   NA 143 06/15/2018   K 4.6 06/15/2018   CL 111 06/15/2018   CO2 26 06/15/2018   GLUCOSE 137 (H) 06/15/2018   BUN 35 (H) 06/15/2018   CREATININE 0.87 06/15/2018   CALCIUM 9.7 06/15/2018   PROT 5.7 (L) 06/15/2018   ALBUMIN 3.8 06/15/2018   AST 39 06/15/2018   ALT 41 06/15/2018   ALKPHOS 118 06/15/2018   BILITOT 2.1 (H) 06/15/2018   GFRNONAA >60 06/15/2018   GFRAA >60 06/15/2018    Lab Results  Component Value Date   WBC 6.4 06/15/2018   NEUTROABS 5.0 06/15/2018   HGB 10.6 (L) 06/15/2018   HCT 30.3 (L) 06/15/2018   MCV 105.0 (H) 06/15/2018   PLT 147 (L) 06/15/2018     STUDIES: Dg Chest 1 View  Result Date: 06/15/2018 CLINICAL DATA:  Altered mental status EXAM: CHEST  1 VIEW COMPARISON:  June 03, 2018 FINDINGS: The heart size and mediastinal contours are within normal limits. There is no focal infiltrate, pulmonary edema, or pleural effusion. The lungs are hyperinflated. The visualized skeletal structures are stable. IMPRESSION: No active cardiopulmonary disease. Electronically Signed   By: Abelardo Diesel M.D.   On: 06/15/2018 19:39   Ct Head Wo Contrast  Result Date: 06/15/2018 CLINICAL DATA:  Slurred speech, acute confusion and disorientation. EXAM: CT HEAD WITHOUT CONTRAST CT CERVICAL SPINE WITHOUT CONTRAST TECHNIQUE: Multidetector CT imaging of the head and cervical spine was performed following the standard protocol without intravenous contrast. Multiplanar CT image reconstructions of the cervical spine were also generated. COMPARISON:  MR brain May 19, 2018 FINDINGS: CT HEAD FINDINGS Brain: No evidence of acute infarction, hemorrhage, hydrocephalus, extra-axial collection or mass  lesion/mass effect. There is chronic diffuse atrophy. Chronic bilateral periventricular white matter small vessel ischemic change is identified. Vascular: No hyperdense vessel or unexpected calcification. Skull: Normal. Negative for fracture or focal lesion. Sinuses/Orbits: No acute finding. Other: None. CT CERVICAL SPINE FINDINGS Alignment: Normal. Skull base and vertebrae: No acute fracture. No primary bone lesion or focal pathologic process. Soft tissues and spinal canal: No prevertebral fluid or swelling. No visible canal hematoma. Disc levels: There are degenerative joint changes with narrowed joint space and osteophyte formation. Upper chest: Negative. Other: None. IMPRESSION: No focal acute intracranial abnormality identified. No acute fracture or dislocation of cervical spine. Electronically Signed   By: Abelardo Diesel M.D.   On: 06/15/2018 18:43   Ct Cervical Spine Wo Contrast  Result Date: 06/15/2018 CLINICAL DATA:  Slurred speech, acute confusion and disorientation. EXAM: CT HEAD WITHOUT CONTRAST CT CERVICAL SPINE WITHOUT CONTRAST TECHNIQUE: Multidetector CT imaging of the head and cervical spine was performed following the standard protocol without intravenous contrast. Multiplanar CT image reconstructions of the cervical spine were also generated. COMPARISON:  MR brain May 19, 2018 FINDINGS: CT HEAD FINDINGS Brain:  No evidence of acute infarction, hemorrhage, hydrocephalus, extra-axial collection or mass lesion/mass effect. There is chronic diffuse atrophy. Chronic bilateral periventricular white matter small vessel ischemic change is identified. Vascular: No hyperdense vessel or unexpected calcification. Skull: Normal. Negative for fracture or focal lesion. Sinuses/Orbits: No acute finding. Other: None. CT CERVICAL SPINE FINDINGS Alignment: Normal. Skull base and vertebrae: No acute fracture. No primary bone lesion or focal pathologic process. Soft tissues and spinal canal: No prevertebral fluid or  swelling. No visible canal hematoma. Disc levels: There are degenerative joint changes with narrowed joint space and osteophyte formation. Upper chest: Negative. Other: None. IMPRESSION: No focal acute intracranial abnormality identified. No acute fracture or dislocation of cervical spine. Electronically Signed   By: Abelardo Diesel M.D.   On: 06/15/2018 18:43   US Abdomen Complete  Result Date: 05/23/2018 CLINICAL DATA:  Abnormal liver function tests. EXAM: ABDOMEN ULTRASOUND COMPLETE COMPARISON:  Ultrasound of February 03, 2017. FINDINGS: Gallbladder: No gallstones or wall thickening visualized. No sonographic Murphy sign noted by sonographer. Mild amount of sludge is noted in gallbladder lumen. Common bile duct: Diameter: 6 mm which is within normal limits. Liver: No focal lesion identified. Increased echogenicity of hepatic parenchyma is noted. Portal vein is patent on color Doppler imaging with normal direction of blood flow towards the liver. IVC: No abnormality visualized. Pancreas: Visualized portion unremarkable. Spleen: Size and appearance within normal limits. Right Kidney: Length: 10.7 cm. 1.4 cm simple cyst is seen in upper pole. Echogenicity within normal limits. No mass or hydronephrosis visualized. Left Kidney: Length: 9.9 cm. Echogenicity within normal limits. No mass or hydronephrosis visualized. Abdominal aorta: No aneurysm visualized. Other findings: None. IMPRESSION: Increased echogenicity of hepatic parenchyma is noted suggesting fatty infiltration. Small amount of gallbladder sludge is noted. Small right renal cyst is noted. No other abnormality seen in the abdomen. Electronically Signed   By: Marijo Conception, M.D.   On: 05/23/2018 11:15   Dg Chest Port 1 View  Result Date: 06/03/2018 CLINICAL DATA:  Vomiting EXAM: PORTABLE CHEST 1 VIEW COMPARISON:  05/18/2018 chest radiograph. FINDINGS: Stable cardiomediastinal silhouette with normal heart size. No pneumothorax. No pleural effusion. No  pulmonary edema. Small vague density overlying the peripheral right mid lung is stable. No acute consolidative airspace disease. IMPRESSION: No acute cardiopulmonary disease. Persistent small vague density overlying the peripheral right mid lung, unchanged since 05/18/2018 chest radiograph. Suggest short-term outpatient follow-up chest CT to exclude a pulmonary nodule in this location. Electronically Signed   By: Ilona Sorrel M.D.   On: 06/03/2018 09:04    ASSESSMENT: Multiple myeloma.  PLAN:  1.  Multiple myeloma: Repeat bone marrow biopsy on March 28, 2018 revealed 23 to 24% plasma cells with kappa restriction, which is only slightly decreased from previous when it was reported patient to have 29% plasma cells.  He has normal cytogenetics.  His kappa free light chains continue to be significantly elevated, but essentially unchanged.  He does not have an M spike on SPEP.  His kappa/lambda free light chain ratio is 116.0 which is significantly improved over 3 months ago.  His IgG, IgA, and IgM are all reduced.  Calcium and creatinine continued to be within normal limits.  His white blood cell count and platelet count are now within normal limits.  He continues to have anemia that is relatively unchanged. Patient never had a metastatic bone survey that was scheduled previously.  Patient could not tolerate Cytoxan given his persistent pancytopenia.  He completed 6 cycles of  Velcade on Feb 23, 2018.  Unclear if this can be used again given his persistent peripheral neuropathy.  Hesitant to use Revlimid or other oral treatments secondary to compliance concerns.  His performance status is declining, therefore will continue to hold treatment at this time.  Return to clinic in 4 weeks with repeat laboratory work and further evaluation.   2.  Leukopenia: Resolved. 3.  Anemia: Patient's hemoglobin remains decreased, but unchanged.  Monitor. 4.  Thrombocytopenia: Resolved. 5.  Elevated MCV: Chronic and unchanged.   Patient's B12 and folate are within normal limits. 6. Peripheral neuropathy: Present prior to beginning treatment and unlikely related to underlying myeloma or his chemotherapy.  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  Continue gabapentin 600 mg 3 times a day.  Patient is not taking folic acid, thiamine, and a multivitamin as directed. 7.  Poor appetite: Given patient's dizziness and falls will discontinue Remeron.  Unclear of patient's compliance with the Megace prescription given previously.  Patient declined evaluation by dietary.   8.  Weakness and fatigue/declining performance status: Likely multifactorial.  Hold treatment as above.  Megace as above. 9.  Hyponatremia: Chronic and unchanged. 10.  Hypokalemia: Patient's potassium level is 2.8.  He declined IV treatment.  Patient was given a prescription for oral potassium supplementation. 11.  Elevated alkaline phosphatase and bilirubin: Patient was scheduled for abdominal ultrasound for further evaluation, but declined further testing.  Patient expressed understanding and was in agreement with this plan. He also understands that He can call clinic at any time with any questions, concerns, or complaints.    Lloyd Huger, MD   06/19/2018 11:05 PM

## 2018-06-20 DIAGNOSIS — W19XXXD Unspecified fall, subsequent encounter: Secondary | ICD-10-CM | POA: Diagnosis not present

## 2018-06-20 DIAGNOSIS — C9002 Multiple myeloma in relapse: Secondary | ICD-10-CM | POA: Diagnosis not present

## 2018-06-20 DIAGNOSIS — E785 Hyperlipidemia, unspecified: Secondary | ICD-10-CM | POA: Diagnosis not present

## 2018-06-20 DIAGNOSIS — L89151 Pressure ulcer of sacral region, stage 1: Secondary | ICD-10-CM | POA: Diagnosis not present

## 2018-06-20 DIAGNOSIS — E1142 Type 2 diabetes mellitus with diabetic polyneuropathy: Secondary | ICD-10-CM | POA: Diagnosis not present

## 2018-06-20 DIAGNOSIS — Z8719 Personal history of other diseases of the digestive system: Secondary | ICD-10-CM | POA: Diagnosis not present

## 2018-06-20 DIAGNOSIS — R627 Adult failure to thrive: Secondary | ICD-10-CM | POA: Diagnosis not present

## 2018-06-20 DIAGNOSIS — E1151 Type 2 diabetes mellitus with diabetic peripheral angiopathy without gangrene: Secondary | ICD-10-CM | POA: Diagnosis not present

## 2018-06-20 DIAGNOSIS — D630 Anemia in neoplastic disease: Secondary | ICD-10-CM | POA: Diagnosis not present

## 2018-06-20 DIAGNOSIS — E039 Hypothyroidism, unspecified: Secondary | ICD-10-CM | POA: Diagnosis not present

## 2018-06-20 DIAGNOSIS — I10 Essential (primary) hypertension: Secondary | ICD-10-CM | POA: Diagnosis not present

## 2018-06-20 LAB — CULTURE, BLOOD (ROUTINE X 2)
Culture: NO GROWTH
Culture: NO GROWTH
Special Requests: ADEQUATE
Special Requests: ADEQUATE

## 2018-06-21 DIAGNOSIS — E785 Hyperlipidemia, unspecified: Secondary | ICD-10-CM | POA: Diagnosis not present

## 2018-06-21 DIAGNOSIS — E1151 Type 2 diabetes mellitus with diabetic peripheral angiopathy without gangrene: Secondary | ICD-10-CM | POA: Diagnosis not present

## 2018-06-21 DIAGNOSIS — L89151 Pressure ulcer of sacral region, stage 1: Secondary | ICD-10-CM | POA: Diagnosis not present

## 2018-06-21 DIAGNOSIS — E039 Hypothyroidism, unspecified: Secondary | ICD-10-CM | POA: Diagnosis not present

## 2018-06-21 DIAGNOSIS — I10 Essential (primary) hypertension: Secondary | ICD-10-CM | POA: Diagnosis not present

## 2018-06-21 DIAGNOSIS — R627 Adult failure to thrive: Secondary | ICD-10-CM | POA: Diagnosis not present

## 2018-06-21 DIAGNOSIS — Z8719 Personal history of other diseases of the digestive system: Secondary | ICD-10-CM | POA: Diagnosis not present

## 2018-06-21 DIAGNOSIS — C9002 Multiple myeloma in relapse: Secondary | ICD-10-CM | POA: Diagnosis not present

## 2018-06-21 DIAGNOSIS — D630 Anemia in neoplastic disease: Secondary | ICD-10-CM | POA: Diagnosis not present

## 2018-06-21 DIAGNOSIS — W19XXXD Unspecified fall, subsequent encounter: Secondary | ICD-10-CM | POA: Diagnosis not present

## 2018-06-21 DIAGNOSIS — E1142 Type 2 diabetes mellitus with diabetic polyneuropathy: Secondary | ICD-10-CM | POA: Diagnosis not present

## 2018-06-22 ENCOUNTER — Inpatient Hospital Stay: Payer: Medicare Other | Admitting: Oncology

## 2018-06-22 DIAGNOSIS — E1151 Type 2 diabetes mellitus with diabetic peripheral angiopathy without gangrene: Secondary | ICD-10-CM | POA: Diagnosis not present

## 2018-06-22 DIAGNOSIS — E039 Hypothyroidism, unspecified: Secondary | ICD-10-CM | POA: Diagnosis not present

## 2018-06-22 DIAGNOSIS — L89151 Pressure ulcer of sacral region, stage 1: Secondary | ICD-10-CM | POA: Diagnosis not present

## 2018-06-22 DIAGNOSIS — C9002 Multiple myeloma in relapse: Secondary | ICD-10-CM | POA: Diagnosis not present

## 2018-06-22 DIAGNOSIS — D630 Anemia in neoplastic disease: Secondary | ICD-10-CM | POA: Diagnosis not present

## 2018-06-22 DIAGNOSIS — I10 Essential (primary) hypertension: Secondary | ICD-10-CM | POA: Diagnosis not present

## 2018-06-22 DIAGNOSIS — R627 Adult failure to thrive: Secondary | ICD-10-CM | POA: Diagnosis not present

## 2018-06-22 DIAGNOSIS — E1142 Type 2 diabetes mellitus with diabetic polyneuropathy: Secondary | ICD-10-CM | POA: Diagnosis not present

## 2018-06-22 DIAGNOSIS — W19XXXD Unspecified fall, subsequent encounter: Secondary | ICD-10-CM | POA: Diagnosis not present

## 2018-06-22 DIAGNOSIS — Z8719 Personal history of other diseases of the digestive system: Secondary | ICD-10-CM | POA: Diagnosis not present

## 2018-06-22 DIAGNOSIS — E785 Hyperlipidemia, unspecified: Secondary | ICD-10-CM | POA: Diagnosis not present

## 2018-06-23 ENCOUNTER — Telehealth: Payer: Self-pay | Admitting: Family Medicine

## 2018-06-23 DIAGNOSIS — C9002 Multiple myeloma in relapse: Secondary | ICD-10-CM | POA: Diagnosis not present

## 2018-06-23 DIAGNOSIS — L89151 Pressure ulcer of sacral region, stage 1: Secondary | ICD-10-CM | POA: Diagnosis not present

## 2018-06-23 DIAGNOSIS — E1142 Type 2 diabetes mellitus with diabetic polyneuropathy: Secondary | ICD-10-CM | POA: Diagnosis not present

## 2018-06-23 DIAGNOSIS — E039 Hypothyroidism, unspecified: Secondary | ICD-10-CM | POA: Diagnosis not present

## 2018-06-23 DIAGNOSIS — R627 Adult failure to thrive: Secondary | ICD-10-CM | POA: Diagnosis not present

## 2018-06-23 DIAGNOSIS — E1151 Type 2 diabetes mellitus with diabetic peripheral angiopathy without gangrene: Secondary | ICD-10-CM | POA: Diagnosis not present

## 2018-06-23 DIAGNOSIS — D630 Anemia in neoplastic disease: Secondary | ICD-10-CM | POA: Diagnosis not present

## 2018-06-23 DIAGNOSIS — Z8719 Personal history of other diseases of the digestive system: Secondary | ICD-10-CM | POA: Diagnosis not present

## 2018-06-23 DIAGNOSIS — W19XXXD Unspecified fall, subsequent encounter: Secondary | ICD-10-CM | POA: Diagnosis not present

## 2018-06-23 DIAGNOSIS — I10 Essential (primary) hypertension: Secondary | ICD-10-CM | POA: Diagnosis not present

## 2018-06-23 DIAGNOSIS — E785 Hyperlipidemia, unspecified: Secondary | ICD-10-CM | POA: Diagnosis not present

## 2018-06-23 NOTE — Telephone Encounter (Signed)
Spoke with pt's family member was trying to reach to sched awv with [facility]. He stated that he has been under the care of a skilled nursing facility for about a month and they may be calling in hospice care.

## 2018-06-24 DIAGNOSIS — E039 Hypothyroidism, unspecified: Secondary | ICD-10-CM | POA: Diagnosis not present

## 2018-06-24 DIAGNOSIS — C9002 Multiple myeloma in relapse: Secondary | ICD-10-CM | POA: Diagnosis not present

## 2018-06-24 DIAGNOSIS — R627 Adult failure to thrive: Secondary | ICD-10-CM | POA: Diagnosis not present

## 2018-06-24 DIAGNOSIS — E1151 Type 2 diabetes mellitus with diabetic peripheral angiopathy without gangrene: Secondary | ICD-10-CM | POA: Diagnosis not present

## 2018-06-24 DIAGNOSIS — Z8719 Personal history of other diseases of the digestive system: Secondary | ICD-10-CM | POA: Diagnosis not present

## 2018-06-24 DIAGNOSIS — I10 Essential (primary) hypertension: Secondary | ICD-10-CM | POA: Diagnosis not present

## 2018-06-24 DIAGNOSIS — L89151 Pressure ulcer of sacral region, stage 1: Secondary | ICD-10-CM | POA: Diagnosis not present

## 2018-06-24 DIAGNOSIS — W19XXXD Unspecified fall, subsequent encounter: Secondary | ICD-10-CM | POA: Diagnosis not present

## 2018-06-24 DIAGNOSIS — E1142 Type 2 diabetes mellitus with diabetic polyneuropathy: Secondary | ICD-10-CM | POA: Diagnosis not present

## 2018-06-24 DIAGNOSIS — E785 Hyperlipidemia, unspecified: Secondary | ICD-10-CM | POA: Diagnosis not present

## 2018-06-24 DIAGNOSIS — D630 Anemia in neoplastic disease: Secondary | ICD-10-CM | POA: Diagnosis not present

## 2018-06-25 DIAGNOSIS — E785 Hyperlipidemia, unspecified: Secondary | ICD-10-CM | POA: Diagnosis not present

## 2018-06-25 DIAGNOSIS — L89151 Pressure ulcer of sacral region, stage 1: Secondary | ICD-10-CM | POA: Diagnosis not present

## 2018-06-25 DIAGNOSIS — E1151 Type 2 diabetes mellitus with diabetic peripheral angiopathy without gangrene: Secondary | ICD-10-CM | POA: Diagnosis not present

## 2018-06-25 DIAGNOSIS — E039 Hypothyroidism, unspecified: Secondary | ICD-10-CM | POA: Diagnosis not present

## 2018-06-25 DIAGNOSIS — R627 Adult failure to thrive: Secondary | ICD-10-CM | POA: Diagnosis not present

## 2018-06-25 DIAGNOSIS — D630 Anemia in neoplastic disease: Secondary | ICD-10-CM | POA: Diagnosis not present

## 2018-06-25 DIAGNOSIS — Z8719 Personal history of other diseases of the digestive system: Secondary | ICD-10-CM | POA: Diagnosis not present

## 2018-06-25 DIAGNOSIS — E1142 Type 2 diabetes mellitus with diabetic polyneuropathy: Secondary | ICD-10-CM | POA: Diagnosis not present

## 2018-06-25 DIAGNOSIS — I10 Essential (primary) hypertension: Secondary | ICD-10-CM | POA: Diagnosis not present

## 2018-06-25 DIAGNOSIS — W19XXXD Unspecified fall, subsequent encounter: Secondary | ICD-10-CM | POA: Diagnosis not present

## 2018-06-25 DIAGNOSIS — C9002 Multiple myeloma in relapse: Secondary | ICD-10-CM | POA: Diagnosis not present

## 2018-06-26 DIAGNOSIS — L89151 Pressure ulcer of sacral region, stage 1: Secondary | ICD-10-CM | POA: Diagnosis not present

## 2018-06-26 DIAGNOSIS — Z8719 Personal history of other diseases of the digestive system: Secondary | ICD-10-CM | POA: Diagnosis not present

## 2018-06-26 DIAGNOSIS — E039 Hypothyroidism, unspecified: Secondary | ICD-10-CM | POA: Diagnosis not present

## 2018-06-26 DIAGNOSIS — I10 Essential (primary) hypertension: Secondary | ICD-10-CM | POA: Diagnosis not present

## 2018-06-26 DIAGNOSIS — D630 Anemia in neoplastic disease: Secondary | ICD-10-CM | POA: Diagnosis not present

## 2018-06-26 DIAGNOSIS — E1151 Type 2 diabetes mellitus with diabetic peripheral angiopathy without gangrene: Secondary | ICD-10-CM | POA: Diagnosis not present

## 2018-06-26 DIAGNOSIS — R627 Adult failure to thrive: Secondary | ICD-10-CM | POA: Diagnosis not present

## 2018-06-26 DIAGNOSIS — C9002 Multiple myeloma in relapse: Secondary | ICD-10-CM | POA: Diagnosis not present

## 2018-06-26 DIAGNOSIS — E785 Hyperlipidemia, unspecified: Secondary | ICD-10-CM | POA: Diagnosis not present

## 2018-06-26 DIAGNOSIS — W19XXXD Unspecified fall, subsequent encounter: Secondary | ICD-10-CM | POA: Diagnosis not present

## 2018-06-26 DIAGNOSIS — E1142 Type 2 diabetes mellitus with diabetic polyneuropathy: Secondary | ICD-10-CM | POA: Diagnosis not present

## 2018-06-27 DIAGNOSIS — C9002 Multiple myeloma in relapse: Secondary | ICD-10-CM | POA: Diagnosis not present

## 2018-06-27 DIAGNOSIS — D630 Anemia in neoplastic disease: Secondary | ICD-10-CM | POA: Diagnosis not present

## 2018-06-27 DIAGNOSIS — I739 Peripheral vascular disease, unspecified: Secondary | ICD-10-CM | POA: Diagnosis not present

## 2018-06-27 DIAGNOSIS — R627 Adult failure to thrive: Secondary | ICD-10-CM | POA: Diagnosis not present

## 2018-06-27 DIAGNOSIS — W19XXXD Unspecified fall, subsequent encounter: Secondary | ICD-10-CM | POA: Diagnosis not present

## 2018-06-27 DIAGNOSIS — E785 Hyperlipidemia, unspecified: Secondary | ICD-10-CM | POA: Diagnosis not present

## 2018-06-27 DIAGNOSIS — Z8719 Personal history of other diseases of the digestive system: Secondary | ICD-10-CM | POA: Diagnosis not present

## 2018-06-27 DIAGNOSIS — E1151 Type 2 diabetes mellitus with diabetic peripheral angiopathy without gangrene: Secondary | ICD-10-CM | POA: Diagnosis not present

## 2018-06-27 DIAGNOSIS — E1140 Type 2 diabetes mellitus with diabetic neuropathy, unspecified: Secondary | ICD-10-CM | POA: Diagnosis not present

## 2018-06-27 DIAGNOSIS — E1142 Type 2 diabetes mellitus with diabetic polyneuropathy: Secondary | ICD-10-CM | POA: Diagnosis not present

## 2018-06-27 DIAGNOSIS — C9000 Multiple myeloma not having achieved remission: Secondary | ICD-10-CM | POA: Diagnosis not present

## 2018-06-27 DIAGNOSIS — L89151 Pressure ulcer of sacral region, stage 1: Secondary | ICD-10-CM | POA: Diagnosis not present

## 2018-06-27 DIAGNOSIS — I10 Essential (primary) hypertension: Secondary | ICD-10-CM | POA: Diagnosis not present

## 2018-06-27 DIAGNOSIS — E039 Hypothyroidism, unspecified: Secondary | ICD-10-CM | POA: Diagnosis not present

## 2018-06-28 DIAGNOSIS — L89151 Pressure ulcer of sacral region, stage 1: Secondary | ICD-10-CM | POA: Diagnosis not present

## 2018-06-28 DIAGNOSIS — I10 Essential (primary) hypertension: Secondary | ICD-10-CM | POA: Diagnosis not present

## 2018-06-28 DIAGNOSIS — R627 Adult failure to thrive: Secondary | ICD-10-CM | POA: Diagnosis not present

## 2018-06-28 DIAGNOSIS — E1142 Type 2 diabetes mellitus with diabetic polyneuropathy: Secondary | ICD-10-CM | POA: Diagnosis not present

## 2018-06-28 DIAGNOSIS — C9002 Multiple myeloma in relapse: Secondary | ICD-10-CM | POA: Diagnosis not present

## 2018-06-28 DIAGNOSIS — E1151 Type 2 diabetes mellitus with diabetic peripheral angiopathy without gangrene: Secondary | ICD-10-CM | POA: Diagnosis not present

## 2018-06-28 DIAGNOSIS — E039 Hypothyroidism, unspecified: Secondary | ICD-10-CM | POA: Diagnosis not present

## 2018-06-28 DIAGNOSIS — W19XXXD Unspecified fall, subsequent encounter: Secondary | ICD-10-CM | POA: Diagnosis not present

## 2018-06-28 DIAGNOSIS — Z8719 Personal history of other diseases of the digestive system: Secondary | ICD-10-CM | POA: Diagnosis not present

## 2018-06-28 DIAGNOSIS — E785 Hyperlipidemia, unspecified: Secondary | ICD-10-CM | POA: Diagnosis not present

## 2018-06-28 DIAGNOSIS — D630 Anemia in neoplastic disease: Secondary | ICD-10-CM | POA: Diagnosis not present

## 2018-06-29 DIAGNOSIS — W19XXXD Unspecified fall, subsequent encounter: Secondary | ICD-10-CM | POA: Diagnosis not present

## 2018-06-29 DIAGNOSIS — E785 Hyperlipidemia, unspecified: Secondary | ICD-10-CM | POA: Diagnosis not present

## 2018-06-29 DIAGNOSIS — L89151 Pressure ulcer of sacral region, stage 1: Secondary | ICD-10-CM | POA: Diagnosis not present

## 2018-06-29 DIAGNOSIS — E1151 Type 2 diabetes mellitus with diabetic peripheral angiopathy without gangrene: Secondary | ICD-10-CM | POA: Diagnosis not present

## 2018-06-29 DIAGNOSIS — E1142 Type 2 diabetes mellitus with diabetic polyneuropathy: Secondary | ICD-10-CM | POA: Diagnosis not present

## 2018-06-29 DIAGNOSIS — C9002 Multiple myeloma in relapse: Secondary | ICD-10-CM | POA: Diagnosis not present

## 2018-06-29 DIAGNOSIS — E039 Hypothyroidism, unspecified: Secondary | ICD-10-CM | POA: Diagnosis not present

## 2018-06-29 DIAGNOSIS — I10 Essential (primary) hypertension: Secondary | ICD-10-CM | POA: Diagnosis not present

## 2018-06-29 DIAGNOSIS — D630 Anemia in neoplastic disease: Secondary | ICD-10-CM | POA: Diagnosis not present

## 2018-06-29 DIAGNOSIS — R627 Adult failure to thrive: Secondary | ICD-10-CM | POA: Diagnosis not present

## 2018-06-29 DIAGNOSIS — Z8719 Personal history of other diseases of the digestive system: Secondary | ICD-10-CM | POA: Diagnosis not present

## 2018-06-30 DIAGNOSIS — D630 Anemia in neoplastic disease: Secondary | ICD-10-CM | POA: Diagnosis not present

## 2018-06-30 DIAGNOSIS — R627 Adult failure to thrive: Secondary | ICD-10-CM | POA: Diagnosis not present

## 2018-06-30 DIAGNOSIS — E039 Hypothyroidism, unspecified: Secondary | ICD-10-CM | POA: Diagnosis not present

## 2018-06-30 DIAGNOSIS — W19XXXD Unspecified fall, subsequent encounter: Secondary | ICD-10-CM | POA: Diagnosis not present

## 2018-06-30 DIAGNOSIS — C9002 Multiple myeloma in relapse: Secondary | ICD-10-CM | POA: Diagnosis not present

## 2018-06-30 DIAGNOSIS — E1142 Type 2 diabetes mellitus with diabetic polyneuropathy: Secondary | ICD-10-CM | POA: Diagnosis not present

## 2018-06-30 DIAGNOSIS — E785 Hyperlipidemia, unspecified: Secondary | ICD-10-CM | POA: Diagnosis not present

## 2018-06-30 DIAGNOSIS — Z8719 Personal history of other diseases of the digestive system: Secondary | ICD-10-CM | POA: Diagnosis not present

## 2018-06-30 DIAGNOSIS — L89151 Pressure ulcer of sacral region, stage 1: Secondary | ICD-10-CM | POA: Diagnosis not present

## 2018-06-30 DIAGNOSIS — I10 Essential (primary) hypertension: Secondary | ICD-10-CM | POA: Diagnosis not present

## 2018-06-30 DIAGNOSIS — E1151 Type 2 diabetes mellitus with diabetic peripheral angiopathy without gangrene: Secondary | ICD-10-CM | POA: Diagnosis not present

## 2018-07-01 DIAGNOSIS — E1151 Type 2 diabetes mellitus with diabetic peripheral angiopathy without gangrene: Secondary | ICD-10-CM | POA: Diagnosis not present

## 2018-07-01 DIAGNOSIS — C9002 Multiple myeloma in relapse: Secondary | ICD-10-CM | POA: Diagnosis not present

## 2018-07-01 DIAGNOSIS — E785 Hyperlipidemia, unspecified: Secondary | ICD-10-CM | POA: Diagnosis not present

## 2018-07-01 DIAGNOSIS — W19XXXD Unspecified fall, subsequent encounter: Secondary | ICD-10-CM | POA: Diagnosis not present

## 2018-07-01 DIAGNOSIS — D630 Anemia in neoplastic disease: Secondary | ICD-10-CM | POA: Diagnosis not present

## 2018-07-01 DIAGNOSIS — E039 Hypothyroidism, unspecified: Secondary | ICD-10-CM | POA: Diagnosis not present

## 2018-07-01 DIAGNOSIS — I10 Essential (primary) hypertension: Secondary | ICD-10-CM | POA: Diagnosis not present

## 2018-07-01 DIAGNOSIS — Z8719 Personal history of other diseases of the digestive system: Secondary | ICD-10-CM | POA: Diagnosis not present

## 2018-07-01 DIAGNOSIS — R627 Adult failure to thrive: Secondary | ICD-10-CM | POA: Diagnosis not present

## 2018-07-01 DIAGNOSIS — E1142 Type 2 diabetes mellitus with diabetic polyneuropathy: Secondary | ICD-10-CM | POA: Diagnosis not present

## 2018-07-01 DIAGNOSIS — L89151 Pressure ulcer of sacral region, stage 1: Secondary | ICD-10-CM | POA: Diagnosis not present

## 2018-07-02 DIAGNOSIS — I10 Essential (primary) hypertension: Secondary | ICD-10-CM | POA: Diagnosis not present

## 2018-07-02 DIAGNOSIS — E039 Hypothyroidism, unspecified: Secondary | ICD-10-CM | POA: Diagnosis not present

## 2018-07-02 DIAGNOSIS — E1142 Type 2 diabetes mellitus with diabetic polyneuropathy: Secondary | ICD-10-CM | POA: Diagnosis not present

## 2018-07-02 DIAGNOSIS — D630 Anemia in neoplastic disease: Secondary | ICD-10-CM | POA: Diagnosis not present

## 2018-07-02 DIAGNOSIS — L89151 Pressure ulcer of sacral region, stage 1: Secondary | ICD-10-CM | POA: Diagnosis not present

## 2018-07-02 DIAGNOSIS — E1151 Type 2 diabetes mellitus with diabetic peripheral angiopathy without gangrene: Secondary | ICD-10-CM | POA: Diagnosis not present

## 2018-07-02 DIAGNOSIS — C9002 Multiple myeloma in relapse: Secondary | ICD-10-CM | POA: Diagnosis not present

## 2018-07-02 DIAGNOSIS — Z8719 Personal history of other diseases of the digestive system: Secondary | ICD-10-CM | POA: Diagnosis not present

## 2018-07-02 DIAGNOSIS — W19XXXD Unspecified fall, subsequent encounter: Secondary | ICD-10-CM | POA: Diagnosis not present

## 2018-07-02 DIAGNOSIS — R627 Adult failure to thrive: Secondary | ICD-10-CM | POA: Diagnosis not present

## 2018-07-02 DIAGNOSIS — E785 Hyperlipidemia, unspecified: Secondary | ICD-10-CM | POA: Diagnosis not present

## 2018-07-03 DIAGNOSIS — E1142 Type 2 diabetes mellitus with diabetic polyneuropathy: Secondary | ICD-10-CM | POA: Diagnosis not present

## 2018-07-03 DIAGNOSIS — I10 Essential (primary) hypertension: Secondary | ICD-10-CM | POA: Diagnosis not present

## 2018-07-03 DIAGNOSIS — C9002 Multiple myeloma in relapse: Secondary | ICD-10-CM | POA: Diagnosis not present

## 2018-07-03 DIAGNOSIS — E785 Hyperlipidemia, unspecified: Secondary | ICD-10-CM | POA: Diagnosis not present

## 2018-07-03 DIAGNOSIS — L89151 Pressure ulcer of sacral region, stage 1: Secondary | ICD-10-CM | POA: Diagnosis not present

## 2018-07-03 DIAGNOSIS — E1151 Type 2 diabetes mellitus with diabetic peripheral angiopathy without gangrene: Secondary | ICD-10-CM | POA: Diagnosis not present

## 2018-07-03 DIAGNOSIS — W19XXXD Unspecified fall, subsequent encounter: Secondary | ICD-10-CM | POA: Diagnosis not present

## 2018-07-03 DIAGNOSIS — D630 Anemia in neoplastic disease: Secondary | ICD-10-CM | POA: Diagnosis not present

## 2018-07-03 DIAGNOSIS — R627 Adult failure to thrive: Secondary | ICD-10-CM | POA: Diagnosis not present

## 2018-07-03 DIAGNOSIS — Z8719 Personal history of other diseases of the digestive system: Secondary | ICD-10-CM | POA: Diagnosis not present

## 2018-07-03 DIAGNOSIS — E039 Hypothyroidism, unspecified: Secondary | ICD-10-CM | POA: Diagnosis not present

## 2018-07-04 DIAGNOSIS — E039 Hypothyroidism, unspecified: Secondary | ICD-10-CM | POA: Diagnosis not present

## 2018-07-04 DIAGNOSIS — W19XXXD Unspecified fall, subsequent encounter: Secondary | ICD-10-CM | POA: Diagnosis not present

## 2018-07-04 DIAGNOSIS — Z8719 Personal history of other diseases of the digestive system: Secondary | ICD-10-CM | POA: Diagnosis not present

## 2018-07-04 DIAGNOSIS — D630 Anemia in neoplastic disease: Secondary | ICD-10-CM | POA: Diagnosis not present

## 2018-07-04 DIAGNOSIS — E1142 Type 2 diabetes mellitus with diabetic polyneuropathy: Secondary | ICD-10-CM | POA: Diagnosis not present

## 2018-07-04 DIAGNOSIS — E785 Hyperlipidemia, unspecified: Secondary | ICD-10-CM | POA: Diagnosis not present

## 2018-07-04 DIAGNOSIS — R627 Adult failure to thrive: Secondary | ICD-10-CM | POA: Diagnosis not present

## 2018-07-04 DIAGNOSIS — E1151 Type 2 diabetes mellitus with diabetic peripheral angiopathy without gangrene: Secondary | ICD-10-CM | POA: Diagnosis not present

## 2018-07-04 DIAGNOSIS — L89151 Pressure ulcer of sacral region, stage 1: Secondary | ICD-10-CM | POA: Diagnosis not present

## 2018-07-04 DIAGNOSIS — I10 Essential (primary) hypertension: Secondary | ICD-10-CM | POA: Diagnosis not present

## 2018-07-04 DIAGNOSIS — C9002 Multiple myeloma in relapse: Secondary | ICD-10-CM | POA: Diagnosis not present

## 2018-07-05 DIAGNOSIS — E1151 Type 2 diabetes mellitus with diabetic peripheral angiopathy without gangrene: Secondary | ICD-10-CM | POA: Diagnosis not present

## 2018-07-05 DIAGNOSIS — Z8719 Personal history of other diseases of the digestive system: Secondary | ICD-10-CM | POA: Diagnosis not present

## 2018-07-05 DIAGNOSIS — E1142 Type 2 diabetes mellitus with diabetic polyneuropathy: Secondary | ICD-10-CM | POA: Diagnosis not present

## 2018-07-05 DIAGNOSIS — D630 Anemia in neoplastic disease: Secondary | ICD-10-CM | POA: Diagnosis not present

## 2018-07-05 DIAGNOSIS — R627 Adult failure to thrive: Secondary | ICD-10-CM | POA: Diagnosis not present

## 2018-07-05 DIAGNOSIS — L89151 Pressure ulcer of sacral region, stage 1: Secondary | ICD-10-CM | POA: Diagnosis not present

## 2018-07-05 DIAGNOSIS — W19XXXD Unspecified fall, subsequent encounter: Secondary | ICD-10-CM | POA: Diagnosis not present

## 2018-07-05 DIAGNOSIS — I10 Essential (primary) hypertension: Secondary | ICD-10-CM | POA: Diagnosis not present

## 2018-07-05 DIAGNOSIS — C9002 Multiple myeloma in relapse: Secondary | ICD-10-CM | POA: Diagnosis not present

## 2018-07-05 DIAGNOSIS — E785 Hyperlipidemia, unspecified: Secondary | ICD-10-CM | POA: Diagnosis not present

## 2018-07-05 DIAGNOSIS — E039 Hypothyroidism, unspecified: Secondary | ICD-10-CM | POA: Diagnosis not present

## 2018-07-06 DIAGNOSIS — L89151 Pressure ulcer of sacral region, stage 1: Secondary | ICD-10-CM | POA: Diagnosis not present

## 2018-07-06 DIAGNOSIS — C9002 Multiple myeloma in relapse: Secondary | ICD-10-CM | POA: Diagnosis not present

## 2018-07-06 DIAGNOSIS — E039 Hypothyroidism, unspecified: Secondary | ICD-10-CM | POA: Diagnosis not present

## 2018-07-06 DIAGNOSIS — I10 Essential (primary) hypertension: Secondary | ICD-10-CM | POA: Diagnosis not present

## 2018-07-06 DIAGNOSIS — E1151 Type 2 diabetes mellitus with diabetic peripheral angiopathy without gangrene: Secondary | ICD-10-CM | POA: Diagnosis not present

## 2018-07-06 DIAGNOSIS — E785 Hyperlipidemia, unspecified: Secondary | ICD-10-CM | POA: Diagnosis not present

## 2018-07-06 DIAGNOSIS — W19XXXD Unspecified fall, subsequent encounter: Secondary | ICD-10-CM | POA: Diagnosis not present

## 2018-07-06 DIAGNOSIS — R627 Adult failure to thrive: Secondary | ICD-10-CM | POA: Diagnosis not present

## 2018-07-06 DIAGNOSIS — Z8719 Personal history of other diseases of the digestive system: Secondary | ICD-10-CM | POA: Diagnosis not present

## 2018-07-06 DIAGNOSIS — E1142 Type 2 diabetes mellitus with diabetic polyneuropathy: Secondary | ICD-10-CM | POA: Diagnosis not present

## 2018-07-06 DIAGNOSIS — D630 Anemia in neoplastic disease: Secondary | ICD-10-CM | POA: Diagnosis not present

## 2018-07-07 DIAGNOSIS — E785 Hyperlipidemia, unspecified: Secondary | ICD-10-CM | POA: Diagnosis not present

## 2018-07-07 DIAGNOSIS — E1151 Type 2 diabetes mellitus with diabetic peripheral angiopathy without gangrene: Secondary | ICD-10-CM | POA: Diagnosis not present

## 2018-07-07 DIAGNOSIS — R627 Adult failure to thrive: Secondary | ICD-10-CM | POA: Diagnosis not present

## 2018-07-07 DIAGNOSIS — C9002 Multiple myeloma in relapse: Secondary | ICD-10-CM | POA: Diagnosis not present

## 2018-07-07 DIAGNOSIS — I10 Essential (primary) hypertension: Secondary | ICD-10-CM | POA: Diagnosis not present

## 2018-07-07 DIAGNOSIS — E039 Hypothyroidism, unspecified: Secondary | ICD-10-CM | POA: Diagnosis not present

## 2018-07-07 DIAGNOSIS — Z8719 Personal history of other diseases of the digestive system: Secondary | ICD-10-CM | POA: Diagnosis not present

## 2018-07-07 DIAGNOSIS — L89151 Pressure ulcer of sacral region, stage 1: Secondary | ICD-10-CM | POA: Diagnosis not present

## 2018-07-07 DIAGNOSIS — E1142 Type 2 diabetes mellitus with diabetic polyneuropathy: Secondary | ICD-10-CM | POA: Diagnosis not present

## 2018-07-07 DIAGNOSIS — W19XXXD Unspecified fall, subsequent encounter: Secondary | ICD-10-CM | POA: Diagnosis not present

## 2018-07-07 DIAGNOSIS — D630 Anemia in neoplastic disease: Secondary | ICD-10-CM | POA: Diagnosis not present

## 2018-07-08 DIAGNOSIS — W19XXXD Unspecified fall, subsequent encounter: Secondary | ICD-10-CM | POA: Diagnosis not present

## 2018-07-08 DIAGNOSIS — C9002 Multiple myeloma in relapse: Secondary | ICD-10-CM | POA: Diagnosis not present

## 2018-07-08 DIAGNOSIS — R627 Adult failure to thrive: Secondary | ICD-10-CM | POA: Diagnosis not present

## 2018-07-08 DIAGNOSIS — Z8719 Personal history of other diseases of the digestive system: Secondary | ICD-10-CM | POA: Diagnosis not present

## 2018-07-08 DIAGNOSIS — I10 Essential (primary) hypertension: Secondary | ICD-10-CM | POA: Diagnosis not present

## 2018-07-08 DIAGNOSIS — E039 Hypothyroidism, unspecified: Secondary | ICD-10-CM | POA: Diagnosis not present

## 2018-07-08 DIAGNOSIS — L89151 Pressure ulcer of sacral region, stage 1: Secondary | ICD-10-CM | POA: Diagnosis not present

## 2018-07-08 DIAGNOSIS — D630 Anemia in neoplastic disease: Secondary | ICD-10-CM | POA: Diagnosis not present

## 2018-07-08 DIAGNOSIS — E1151 Type 2 diabetes mellitus with diabetic peripheral angiopathy without gangrene: Secondary | ICD-10-CM | POA: Diagnosis not present

## 2018-07-08 DIAGNOSIS — E785 Hyperlipidemia, unspecified: Secondary | ICD-10-CM | POA: Diagnosis not present

## 2018-07-08 DIAGNOSIS — E1142 Type 2 diabetes mellitus with diabetic polyneuropathy: Secondary | ICD-10-CM | POA: Diagnosis not present

## 2018-07-09 DIAGNOSIS — C9002 Multiple myeloma in relapse: Secondary | ICD-10-CM | POA: Diagnosis not present

## 2018-07-09 DIAGNOSIS — I10 Essential (primary) hypertension: Secondary | ICD-10-CM | POA: Diagnosis not present

## 2018-07-09 DIAGNOSIS — E1142 Type 2 diabetes mellitus with diabetic polyneuropathy: Secondary | ICD-10-CM | POA: Diagnosis not present

## 2018-07-09 DIAGNOSIS — E785 Hyperlipidemia, unspecified: Secondary | ICD-10-CM | POA: Diagnosis not present

## 2018-07-09 DIAGNOSIS — E1151 Type 2 diabetes mellitus with diabetic peripheral angiopathy without gangrene: Secondary | ICD-10-CM | POA: Diagnosis not present

## 2018-07-09 DIAGNOSIS — R627 Adult failure to thrive: Secondary | ICD-10-CM | POA: Diagnosis not present

## 2018-07-09 DIAGNOSIS — L89151 Pressure ulcer of sacral region, stage 1: Secondary | ICD-10-CM | POA: Diagnosis not present

## 2018-07-09 DIAGNOSIS — Z8719 Personal history of other diseases of the digestive system: Secondary | ICD-10-CM | POA: Diagnosis not present

## 2018-07-09 DIAGNOSIS — E039 Hypothyroidism, unspecified: Secondary | ICD-10-CM | POA: Diagnosis not present

## 2018-07-09 DIAGNOSIS — D630 Anemia in neoplastic disease: Secondary | ICD-10-CM | POA: Diagnosis not present

## 2018-07-09 DIAGNOSIS — W19XXXD Unspecified fall, subsequent encounter: Secondary | ICD-10-CM | POA: Diagnosis not present

## 2018-07-10 DIAGNOSIS — C9002 Multiple myeloma in relapse: Secondary | ICD-10-CM | POA: Diagnosis not present

## 2018-07-10 DIAGNOSIS — R627 Adult failure to thrive: Secondary | ICD-10-CM | POA: Diagnosis not present

## 2018-07-10 DIAGNOSIS — Z8719 Personal history of other diseases of the digestive system: Secondary | ICD-10-CM | POA: Diagnosis not present

## 2018-07-10 DIAGNOSIS — I10 Essential (primary) hypertension: Secondary | ICD-10-CM | POA: Diagnosis not present

## 2018-07-10 DIAGNOSIS — D630 Anemia in neoplastic disease: Secondary | ICD-10-CM | POA: Diagnosis not present

## 2018-07-10 DIAGNOSIS — E1151 Type 2 diabetes mellitus with diabetic peripheral angiopathy without gangrene: Secondary | ICD-10-CM | POA: Diagnosis not present

## 2018-07-10 DIAGNOSIS — W19XXXD Unspecified fall, subsequent encounter: Secondary | ICD-10-CM | POA: Diagnosis not present

## 2018-07-10 DIAGNOSIS — E1142 Type 2 diabetes mellitus with diabetic polyneuropathy: Secondary | ICD-10-CM | POA: Diagnosis not present

## 2018-07-10 DIAGNOSIS — L89151 Pressure ulcer of sacral region, stage 1: Secondary | ICD-10-CM | POA: Diagnosis not present

## 2018-07-10 DIAGNOSIS — E039 Hypothyroidism, unspecified: Secondary | ICD-10-CM | POA: Diagnosis not present

## 2018-07-10 DIAGNOSIS — E785 Hyperlipidemia, unspecified: Secondary | ICD-10-CM | POA: Diagnosis not present

## 2018-07-24 DIAGNOSIS — R627 Adult failure to thrive: Secondary | ICD-10-CM | POA: Diagnosis not present

## 2018-07-24 DIAGNOSIS — I10 Essential (primary) hypertension: Secondary | ICD-10-CM | POA: Diagnosis not present

## 2018-07-24 DIAGNOSIS — E119 Type 2 diabetes mellitus without complications: Secondary | ICD-10-CM | POA: Diagnosis not present

## 2018-07-24 DIAGNOSIS — C9000 Multiple myeloma not having achieved remission: Secondary | ICD-10-CM | POA: Diagnosis not present

## 2018-07-24 DIAGNOSIS — I739 Peripheral vascular disease, unspecified: Secondary | ICD-10-CM | POA: Diagnosis not present

## 2018-08-18 DIAGNOSIS — D649 Anemia, unspecified: Secondary | ICD-10-CM | POA: Diagnosis not present

## 2018-08-24 DIAGNOSIS — E46 Unspecified protein-calorie malnutrition: Secondary | ICD-10-CM | POA: Diagnosis not present

## 2018-08-24 DIAGNOSIS — R202 Paresthesia of skin: Secondary | ICD-10-CM | POA: Diagnosis not present

## 2018-08-24 DIAGNOSIS — C9000 Multiple myeloma not having achieved remission: Secondary | ICD-10-CM | POA: Diagnosis not present

## 2018-09-11 DIAGNOSIS — C9000 Multiple myeloma not having achieved remission: Secondary | ICD-10-CM | POA: Diagnosis not present

## 2018-09-11 DIAGNOSIS — I739 Peripheral vascular disease, unspecified: Secondary | ICD-10-CM | POA: Diagnosis not present

## 2018-09-11 DIAGNOSIS — I10 Essential (primary) hypertension: Secondary | ICD-10-CM | POA: Diagnosis not present

## 2018-09-11 DIAGNOSIS — R627 Adult failure to thrive: Secondary | ICD-10-CM | POA: Diagnosis not present

## 2018-09-11 DIAGNOSIS — E119 Type 2 diabetes mellitus without complications: Secondary | ICD-10-CM | POA: Diagnosis not present

## 2018-09-22 DIAGNOSIS — R05 Cough: Secondary | ICD-10-CM | POA: Diagnosis not present

## 2018-10-19 DIAGNOSIS — D649 Anemia, unspecified: Secondary | ICD-10-CM | POA: Diagnosis not present

## 2018-10-19 DIAGNOSIS — Z79899 Other long term (current) drug therapy: Secondary | ICD-10-CM | POA: Diagnosis not present

## 2018-10-27 DIAGNOSIS — E119 Type 2 diabetes mellitus without complications: Secondary | ICD-10-CM | POA: Diagnosis not present

## 2018-10-27 DIAGNOSIS — Z79899 Other long term (current) drug therapy: Secondary | ICD-10-CM | POA: Diagnosis not present

## 2019-04-02 ENCOUNTER — Telehealth: Payer: Self-pay | Admitting: Family Medicine

## 2019-04-02 DIAGNOSIS — E1140 Type 2 diabetes mellitus with diabetic neuropathy, unspecified: Secondary | ICD-10-CM

## 2019-04-02 DIAGNOSIS — I739 Peripheral vascular disease, unspecified: Secondary | ICD-10-CM

## 2019-04-02 DIAGNOSIS — E039 Hypothyroidism, unspecified: Secondary | ICD-10-CM

## 2019-04-02 DIAGNOSIS — C9000 Multiple myeloma not having achieved remission: Secondary | ICD-10-CM

## 2019-04-02 DIAGNOSIS — Z515 Encounter for palliative care: Secondary | ICD-10-CM

## 2019-04-02 DIAGNOSIS — Z789 Other specified health status: Secondary | ICD-10-CM

## 2019-04-02 NOTE — Telephone Encounter (Signed)
Copied from [place] [id number]. Topic: General - Other >> Apr 02, 2019  1:40 PM Celene Kras A wrote: Reason for CRM: Jodi Mourning, from [facility] [place] hospice, called stating she they would be releasing pt due to stability. Jodi Mourning states they plan to release him at the end of the week and are wanting to place him back under care of Dr. Wynetta Emery. Please advise.   Callback # [phone]

## 2019-04-02 NOTE — Telephone Encounter (Signed)
Can we please find out where his is right now? Home or in a facility? I'm fine taking care of him, just please get him scheduled for a follow up with me ASAP- I'm putting in for home health and CCM referral now and we will formalize everything when I see him.

## 2019-04-02 NOTE — Telephone Encounter (Signed)
Patient is at home. Cancer provider is reviewing labs to make final decision on patient. Jodi Mourning will call back if he is discharged to get patient an appointment.

## 2019-04-03 NOTE — Telephone Encounter (Signed)
Please schedule appointment ASAP

## 2019-04-03 NOTE — Telephone Encounter (Signed)
Copied from [place] [id number]. Topic: Appointment Scheduling - Scheduling Inquiry for Clinic >> Apr 03, 2019 12:48 PM Rayann Heman wrote: Reason for CRM: hospice calling and states that pt will need to schedule an appointment for med refills. Please advise

## 2019-04-03 NOTE — Telephone Encounter (Signed)
Called pt to schedule appt, son Rayfield Beem states pt was sleep and would have him call us back to schedule appt.

## 2019-04-07 DIAGNOSIS — Z9181 History of falling: Secondary | ICD-10-CM | POA: Diagnosis not present

## 2019-04-07 DIAGNOSIS — C9000 Multiple myeloma not having achieved remission: Secondary | ICD-10-CM | POA: Diagnosis not present

## 2019-04-07 DIAGNOSIS — M6281 Muscle weakness (generalized): Secondary | ICD-10-CM | POA: Diagnosis not present

## 2019-04-07 DIAGNOSIS — N181 Chronic kidney disease, stage 1: Secondary | ICD-10-CM | POA: Diagnosis not present

## 2019-04-07 DIAGNOSIS — Z87891 Personal history of nicotine dependence: Secondary | ICD-10-CM | POA: Diagnosis not present

## 2019-04-07 DIAGNOSIS — Z79891 Long term (current) use of opiate analgesic: Secondary | ICD-10-CM | POA: Diagnosis not present

## 2019-04-07 DIAGNOSIS — E039 Hypothyroidism, unspecified: Secondary | ICD-10-CM | POA: Diagnosis not present

## 2019-04-07 DIAGNOSIS — D61818 Other pancytopenia: Secondary | ICD-10-CM | POA: Diagnosis not present

## 2019-04-07 DIAGNOSIS — R627 Adult failure to thrive: Secondary | ICD-10-CM | POA: Diagnosis not present

## 2019-04-07 DIAGNOSIS — E1122 Type 2 diabetes mellitus with diabetic chronic kidney disease: Secondary | ICD-10-CM | POA: Diagnosis not present

## 2019-04-07 DIAGNOSIS — E1151 Type 2 diabetes mellitus with diabetic peripheral angiopathy without gangrene: Secondary | ICD-10-CM | POA: Diagnosis not present

## 2019-04-07 DIAGNOSIS — E538 Deficiency of other specified B group vitamins: Secondary | ICD-10-CM | POA: Diagnosis not present

## 2019-04-07 DIAGNOSIS — I129 Hypertensive chronic kidney disease with stage 1 through stage 4 chronic kidney disease, or unspecified chronic kidney disease: Secondary | ICD-10-CM | POA: Diagnosis not present

## 2019-04-07 DIAGNOSIS — E1142 Type 2 diabetes mellitus with diabetic polyneuropathy: Secondary | ICD-10-CM | POA: Diagnosis not present

## 2019-04-10 DIAGNOSIS — N181 Chronic kidney disease, stage 1: Secondary | ICD-10-CM | POA: Diagnosis not present

## 2019-04-10 DIAGNOSIS — Z9181 History of falling: Secondary | ICD-10-CM | POA: Diagnosis not present

## 2019-04-10 DIAGNOSIS — E538 Deficiency of other specified B group vitamins: Secondary | ICD-10-CM | POA: Diagnosis not present

## 2019-04-10 DIAGNOSIS — E039 Hypothyroidism, unspecified: Secondary | ICD-10-CM | POA: Diagnosis not present

## 2019-04-10 DIAGNOSIS — Z87891 Personal history of nicotine dependence: Secondary | ICD-10-CM | POA: Diagnosis not present

## 2019-04-10 DIAGNOSIS — Z79891 Long term (current) use of opiate analgesic: Secondary | ICD-10-CM | POA: Diagnosis not present

## 2019-04-10 DIAGNOSIS — D61818 Other pancytopenia: Secondary | ICD-10-CM | POA: Diagnosis not present

## 2019-04-10 DIAGNOSIS — E1142 Type 2 diabetes mellitus with diabetic polyneuropathy: Secondary | ICD-10-CM | POA: Diagnosis not present

## 2019-04-10 DIAGNOSIS — E1122 Type 2 diabetes mellitus with diabetic chronic kidney disease: Secondary | ICD-10-CM | POA: Diagnosis not present

## 2019-04-10 DIAGNOSIS — R627 Adult failure to thrive: Secondary | ICD-10-CM | POA: Diagnosis not present

## 2019-04-10 DIAGNOSIS — C9000 Multiple myeloma not having achieved remission: Secondary | ICD-10-CM | POA: Diagnosis not present

## 2019-04-10 DIAGNOSIS — I129 Hypertensive chronic kidney disease with stage 1 through stage 4 chronic kidney disease, or unspecified chronic kidney disease: Secondary | ICD-10-CM | POA: Diagnosis not present

## 2019-04-10 DIAGNOSIS — M6281 Muscle weakness (generalized): Secondary | ICD-10-CM | POA: Diagnosis not present

## 2019-04-10 DIAGNOSIS — E1151 Type 2 diabetes mellitus with diabetic peripheral angiopathy without gangrene: Secondary | ICD-10-CM | POA: Diagnosis not present

## 2019-04-11 ENCOUNTER — Telehealth: Payer: Self-pay

## 2019-04-12 ENCOUNTER — Telehealth: Payer: Self-pay | Admitting: Family Medicine

## 2019-04-12 DIAGNOSIS — E1122 Type 2 diabetes mellitus with diabetic chronic kidney disease: Secondary | ICD-10-CM | POA: Diagnosis not present

## 2019-04-12 DIAGNOSIS — R627 Adult failure to thrive: Secondary | ICD-10-CM | POA: Diagnosis not present

## 2019-04-12 DIAGNOSIS — E538 Deficiency of other specified B group vitamins: Secondary | ICD-10-CM | POA: Diagnosis not present

## 2019-04-12 DIAGNOSIS — Z9181 History of falling: Secondary | ICD-10-CM | POA: Diagnosis not present

## 2019-04-12 DIAGNOSIS — N181 Chronic kidney disease, stage 1: Secondary | ICD-10-CM | POA: Diagnosis not present

## 2019-04-12 DIAGNOSIS — C9000 Multiple myeloma not having achieved remission: Secondary | ICD-10-CM | POA: Diagnosis not present

## 2019-04-12 DIAGNOSIS — E039 Hypothyroidism, unspecified: Secondary | ICD-10-CM | POA: Diagnosis not present

## 2019-04-12 DIAGNOSIS — I129 Hypertensive chronic kidney disease with stage 1 through stage 4 chronic kidney disease, or unspecified chronic kidney disease: Secondary | ICD-10-CM | POA: Diagnosis not present

## 2019-04-12 DIAGNOSIS — M6281 Muscle weakness (generalized): Secondary | ICD-10-CM | POA: Diagnosis not present

## 2019-04-12 DIAGNOSIS — E1151 Type 2 diabetes mellitus with diabetic peripheral angiopathy without gangrene: Secondary | ICD-10-CM | POA: Diagnosis not present

## 2019-04-12 DIAGNOSIS — D61818 Other pancytopenia: Secondary | ICD-10-CM | POA: Diagnosis not present

## 2019-04-12 DIAGNOSIS — Z79891 Long term (current) use of opiate analgesic: Secondary | ICD-10-CM | POA: Diagnosis not present

## 2019-04-12 DIAGNOSIS — E1142 Type 2 diabetes mellitus with diabetic polyneuropathy: Secondary | ICD-10-CM | POA: Diagnosis not present

## 2019-04-12 DIAGNOSIS — Z87891 Personal history of nicotine dependence: Secondary | ICD-10-CM | POA: Diagnosis not present

## 2019-04-12 NOTE — Telephone Encounter (Signed)
OK for verbal orders. Noted on the skilled nursing.

## 2019-04-12 NOTE — Telephone Encounter (Signed)
Missy notified of Dr. Durenda Age ok for verbal orders.

## 2019-04-12 NOTE — Telephone Encounter (Signed)
Home Health Verbal Orders - Caller/Agency: [facility] Number: [phone] (can leave a voicemail) Requesting OT/PT/Skilled Nursing/Social Work/Speech Therapy: Home Health Aide  Frequency:   Once a week frequency.  States that she went out to evaluate on Skilled Nursing and does not see any nursing needs at this time.

## 2019-04-13 DIAGNOSIS — E1142 Type 2 diabetes mellitus with diabetic polyneuropathy: Secondary | ICD-10-CM | POA: Diagnosis not present

## 2019-04-13 DIAGNOSIS — Z9181 History of falling: Secondary | ICD-10-CM | POA: Diagnosis not present

## 2019-04-13 DIAGNOSIS — E1151 Type 2 diabetes mellitus with diabetic peripheral angiopathy without gangrene: Secondary | ICD-10-CM | POA: Diagnosis not present

## 2019-04-13 DIAGNOSIS — N181 Chronic kidney disease, stage 1: Secondary | ICD-10-CM | POA: Diagnosis not present

## 2019-04-13 DIAGNOSIS — R627 Adult failure to thrive: Secondary | ICD-10-CM | POA: Diagnosis not present

## 2019-04-13 DIAGNOSIS — Z79891 Long term (current) use of opiate analgesic: Secondary | ICD-10-CM | POA: Diagnosis not present

## 2019-04-13 DIAGNOSIS — Z87891 Personal history of nicotine dependence: Secondary | ICD-10-CM | POA: Diagnosis not present

## 2019-04-13 DIAGNOSIS — E1122 Type 2 diabetes mellitus with diabetic chronic kidney disease: Secondary | ICD-10-CM | POA: Diagnosis not present

## 2019-04-13 DIAGNOSIS — E039 Hypothyroidism, unspecified: Secondary | ICD-10-CM | POA: Diagnosis not present

## 2019-04-13 DIAGNOSIS — M6281 Muscle weakness (generalized): Secondary | ICD-10-CM | POA: Diagnosis not present

## 2019-04-13 DIAGNOSIS — C9000 Multiple myeloma not having achieved remission: Secondary | ICD-10-CM | POA: Diagnosis not present

## 2019-04-13 DIAGNOSIS — E538 Deficiency of other specified B group vitamins: Secondary | ICD-10-CM | POA: Diagnosis not present

## 2019-04-13 DIAGNOSIS — D61818 Other pancytopenia: Secondary | ICD-10-CM | POA: Diagnosis not present

## 2019-04-13 DIAGNOSIS — I129 Hypertensive chronic kidney disease with stage 1 through stage 4 chronic kidney disease, or unspecified chronic kidney disease: Secondary | ICD-10-CM | POA: Diagnosis not present

## 2019-04-17 ENCOUNTER — Ambulatory Visit: Payer: Medicare Other | Admitting: Pharmacist

## 2019-04-17 ENCOUNTER — Ambulatory Visit: Payer: Medicare Other | Admitting: *Deleted

## 2019-04-17 ENCOUNTER — Encounter: Payer: Self-pay | Admitting: Family Medicine

## 2019-04-17 ENCOUNTER — Other Ambulatory Visit: Payer: Self-pay

## 2019-04-17 ENCOUNTER — Ambulatory Visit (INDEPENDENT_AMBULATORY_CARE_PROVIDER_SITE_OTHER): Payer: Medicare Other | Admitting: Family Medicine

## 2019-04-17 VITALS — BP 129/65 | HR 71 | Temp 98.6°F | Wt 170.0 lb

## 2019-04-17 DIAGNOSIS — E1140 Type 2 diabetes mellitus with diabetic neuropathy, unspecified: Secondary | ICD-10-CM | POA: Diagnosis not present

## 2019-04-17 DIAGNOSIS — G629 Polyneuropathy, unspecified: Secondary | ICD-10-CM

## 2019-04-17 DIAGNOSIS — R627 Adult failure to thrive: Secondary | ICD-10-CM | POA: Diagnosis not present

## 2019-04-17 DIAGNOSIS — E1142 Type 2 diabetes mellitus with diabetic polyneuropathy: Secondary | ICD-10-CM | POA: Diagnosis not present

## 2019-04-17 DIAGNOSIS — E1122 Type 2 diabetes mellitus with diabetic chronic kidney disease: Secondary | ICD-10-CM | POA: Diagnosis not present

## 2019-04-17 DIAGNOSIS — D61818 Other pancytopenia: Secondary | ICD-10-CM

## 2019-04-17 DIAGNOSIS — Z79891 Long term (current) use of opiate analgesic: Secondary | ICD-10-CM | POA: Diagnosis not present

## 2019-04-17 DIAGNOSIS — N181 Chronic kidney disease, stage 1: Secondary | ICD-10-CM

## 2019-04-17 DIAGNOSIS — I739 Peripheral vascular disease, unspecified: Secondary | ICD-10-CM

## 2019-04-17 DIAGNOSIS — N3000 Acute cystitis without hematuria: Secondary | ICD-10-CM

## 2019-04-17 DIAGNOSIS — I129 Hypertensive chronic kidney disease with stage 1 through stage 4 chronic kidney disease, or unspecified chronic kidney disease: Secondary | ICD-10-CM | POA: Diagnosis not present

## 2019-04-17 DIAGNOSIS — E1151 Type 2 diabetes mellitus with diabetic peripheral angiopathy without gangrene: Secondary | ICD-10-CM | POA: Diagnosis not present

## 2019-04-17 DIAGNOSIS — E039 Hypothyroidism, unspecified: Secondary | ICD-10-CM | POA: Diagnosis not present

## 2019-04-17 DIAGNOSIS — E538 Deficiency of other specified B group vitamins: Secondary | ICD-10-CM | POA: Diagnosis not present

## 2019-04-17 DIAGNOSIS — Z87891 Personal history of nicotine dependence: Secondary | ICD-10-CM | POA: Diagnosis not present

## 2019-04-17 DIAGNOSIS — Z9181 History of falling: Secondary | ICD-10-CM | POA: Diagnosis not present

## 2019-04-17 DIAGNOSIS — C9000 Multiple myeloma not having achieved remission: Secondary | ICD-10-CM

## 2019-04-17 DIAGNOSIS — M6281 Muscle weakness (generalized): Secondary | ICD-10-CM | POA: Diagnosis not present

## 2019-04-17 MED ORDER — AMITRIPTYLINE HCL 75 MG PO TABS: 75.0000 mg | ORAL_TABLET | Freq: Every day | ORAL | 3 refills | Status: DC

## 2019-04-17 MED ORDER — CIPROFLOXACIN HCL 500 MG PO TABS: 500.0000 mg | ORAL_TABLET | Freq: Two times a day (BID) | ORAL | 0 refills | Status: DC

## 2019-04-17 NOTE — Assessment & Plan Note (Signed)
Sugars doing great today with A1c of 5.3- will resolve diabetes off his list. Continue to monitor. No need for more medicine at this time.

## 2019-04-17 NOTE — Assessment & Plan Note (Signed)
Rechecking levels today. Await results. Treat as needed. May be contributing to his neuropathy.

## 2019-04-17 NOTE — Assessment & Plan Note (Signed)
States that he doesn't believe he had cancer. Does not want to go back to see cancer center. Will recheck labs. Await results. Continue to monitor.

## 2019-04-17 NOTE — Patient Instructions (Addendum)
Visit Information  Goals Addressed            This Visit's Progress     Patient Stated   . "I'm in pain and have little appetite" (pt-stated)       Current Barriers: . Patient recently discharged from Hospice services d/t being stable. Discharged from hospitalization in 06/2018 to Hospice for failure to thrive. Meeting with primary care provider today to re-establish care . Son [name]) helps manage medications; per chart review, there has been a history of confusion about medication dosing. There are notes that the patient is illiterate.  . Current medications include acetaminophen 1000 mg BID, morphine 5 mg 1-2 times daily, and a multivitamin o Patient's greatest complaint is neuropathy in his hands; previously noted to likely be related to hx chronic alcoholism. Son notes that they stopped gabapentin because it made the son too sleepy. Does not appear that he has tried Lyrica. Has tried amitriptyline before  o Chart review notes that mirtazapine 15 mg was held d/t possibly causing dizziness   Pharmacist Clinical Goal(s):  Marland Kitchen Over the next 30 days, patient will work with PharmD and primary care provider to address needs related to medication management  Interventions: . Comprehensive medication review performed. . Will collaborate with Dr. Wynetta Emery on appropriate medication management for patient at this palliative stage. Will plan to support the son with adherence/management of medications. Dr. Wynetta Emery restarted amitriptyline 75 mg daily - will follow.  Patient Self Care Activities:  . Currently UNABLE TO independently independently manage medications  Initial goal documentation        The patient verbalized understanding of instructions provided today and declined a print copy of patient instruction materials.     Plan: - CCM team will continue to support patient monthly for medication management support  Catie Darnelle Maffucci, PharmD Clinical Pharmacist [place] [phone]

## 2019-04-17 NOTE — Chronic Care Management (AMB) (Signed)
Chronic Care Management   Note  04/17/2019 Name: MELROY BOUGHER MRN: [id number] DOB: 07/02/1941   Subjective:  [name] is a 78 y.o. year old male who is a primary care patient of Valerie Roys, DO. The CCM team was consulted for assistance with chronic disease management and care coordination needs.    Met with patient, his son [name], caregiver), and Janci Minor, RN CM prior to his appointment with primary care providder.    Review of patient status, including review of consultants reports, laboratory and other test data, was performed as part of comprehensive evaluation and provision of chronic care management services.   Objective:  Lab Results  Component Value Date   CREATININE 0.87 06/15/2018   CREATININE 0.62 06/05/2018   CREATININE 0.72 06/04/2018       Component Value Date/Time   CHOL 156 03/30/2017 1003   CHOL 165 04/14/2016 0824   CHOL 107 01/13/2013 0432   TRIG 97 03/30/2017 1003   TRIG 258 (H) 04/14/2016 0824   TRIG 212 (H) 01/13/2013 0432   HDL 50 03/30/2017 1003   HDL 29 (L) 01/13/2013 0432   VLDL 52 (H) 04/14/2016 0824   VLDL 42 (H) 01/13/2013 0432   LDLCALC 87 03/30/2017 1003   LDLCALC 36 01/13/2013 0432    Clinical ASCVD: Yes - PAD The 10-year ASCVD risk score Mikey Bussing DC Jr., et al., 2013) is: 46.1%   Values used to calculate the score:     Age: 40 years     Sex: Male     Is Non-Hispanic African American: No     Diabetic: Yes     Tobacco smoker: Yes     Systolic Blood Pressure: 947 mmHg     Is BP treated: No     HDL Cholesterol: 50 mg/dL     Total Cholesterol: 156 mg/dL    BP Readings from Last 3 Encounters:  04/17/19 129/65  06/16/18 130/60  06/07/18 (!) 136/53    Allergies  Allergen Reactions  . Metformin And Related Nausea And Vomiting    Medications Reviewed Today    Reviewed by Valerie Roys, DO (Physician) on 04/17/19 at 1200  Med List Status: <None>  Medication Order Taking? Sig Documenting Provider Last Dose Status  Informant  acetaminophen (TYLENOL) 500 MG tablet [id number]  Take 1,000 mg by mouth 2 (two) times a day. [provider]  Active   amitriptyline (ELAVIL) 75 MG tablet [id number]  Take 1 tablet (75 mg total) by mouth at bedtime. For hand pain [place] Liter P, DO  Active   ciprofloxacin (CIPRO) 500 MG tablet [id number]  Take 1 tablet (500 mg total) by mouth 2 (two) times daily. For bladder infection Valerie Roys, DO  Active   Multiple Vitamin (MULTIVITAMIN) tablet [id number]  Take 1 tablet by mouth daily. [provider]  Active   Med List Note Francene Finders, CPhT 06/15/18 1935): Care of: [facility] (as of 06/15/2018)           Assessment:   Goals Addressed            This Visit's Progress     Patient Stated   . "I'm in pain and have little appetite" (pt-stated)       Current Barriers: . Patient recently discharged from Hospice services d/t being stable. Discharged from hospitalization in 06/2018 to Hospice for failure to thrive. Meeting with primary care provider today to re-establish care . Son [name]) helps manage medications; per chart review,  there has been a history of confusion about medication dosing. There are notes that the patient is illiterate.  . Current medications include acetaminophen 1000 mg BID, morphine 5 mg 1-2 times daily, and a multivitamin o Patient's greatest complaint is neuropathy in his hands; previously noted to likely be related to hx chronic alcoholism. Son notes that they stopped gabapentin because it made the son too sleepy. Does not appear that he has tried Lyrica. Has tried amitriptyline before  o Chart review notes that mirtazapine 15 mg was held d/t possibly causing dizziness   Pharmacist Clinical Goal(s):  Marland Kitchen Over the next 30 days, patient will work with PharmD and primary care provider to address needs related to medication management  Interventions: . Comprehensive medication review performed. . Will collaborate with  Dr. Wynetta Emery on appropriate medication management for patient at this palliative stage. Will plan to support the son with adherence/management of medications. Dr. Wynetta Emery restarted amitriptyline 75 mg daily - will follow.  Patient Self Care Activities:  . Currently UNABLE TO independently independently manage medications  Initial goal documentation        Plan: - CCM team will continue to support patient monthly for medication management support  Catie Darnelle Maffucci, PharmD Clinical Pharmacist [place] [phone]

## 2019-04-17 NOTE — Assessment & Plan Note (Signed)
Has not been on any thyroid medicine in over a year. Complains of fatigue and lack of energy. Will check labs and treat as needed. Call with any concerns.

## 2019-04-17 NOTE — Assessment & Plan Note (Signed)
Rechecking levels today. Sugars doing great with A1c of 5.3.

## 2019-04-17 NOTE — Assessment & Plan Note (Signed)
States that his hands hurt all the time. Not doing well. Will restart him on amitriptyline 75mg  qHS and recheck in 2 weeks. Call with any concerns. Continue to monitor- checking labs, thyroid and B12 may also be contributing.

## 2019-04-17 NOTE — Chronic Care Management (AMB) (Signed)
  Chronic Care Management   Initial Visit Note  04/17/2019 Name: ELLA GUILLOTTE MRN: [id number] DOB: Sep 06, 1941  Referred by: Valerie Roys, DO Reason for referral : Chronic Care Management (Initial CCM consult)   REGIE BUNNER is a 78 y.o. year old male who is a primary care patient of Valerie Roys, DO. The CCM team was consulted for assistance with chronic disease management and care coordination needs.   Review of patient status, including review of consultants reports, relevant laboratory and other test results, and collaboration with appropriate care team members and the patient's provider was performed as part of comprehensive patient evaluation and provision of chronic care management services.    SDOH (Social Determinants of Health) screening performed today. See Care Plan Entry related to challenges with: Financial Strain  Food Insecurity  Stress Pain  Objective:   Goals Addressed            This Visit's Progress   . My hands hurt and hospice is not coming anymore (pt-stated)       Current Barriers:  Marland Kitchen Knowledge Deficits related to chronic pain and care giving needs . Film/video editor.   Nurse Case Manager Clinical Goal(s):  Marland Kitchen Over the next 90 days, patient will work with [facility] to address needs related to Chronic pain and care giving needs.  Interventions:  . Reviewed medications with patient and discussed pain to his hands and the patient stating the gabapentin not helpful . Collaborated with [facility] Nurse with [facility] regarding patient's home situation . She states son does well caring for his dad managing his medications and providing meals. She stated the patient was evaluated by hospice SW and was deemed in financial need. She also stated patient was noncompliant with gabapentin.  . Discussed plans with patient for ongoing care management follow up and provided patient with direct contact information for care management team . Care Guide referral  for for food pantry resources  . Social Work referral for financial evaluation related to patient's son stating he needed help buying cleaning supplies.   Patient Self Care Activities:  . Currently UNABLE TO independently manage his chronic pain/care needs  Initial goal documentation         Mr. Musgrave was given information about Chronic Care Management services today including:  1. CCM service includes personalized support from designated clinical staff supervised by his physician, including individualized plan of care and coordination with other care providers 2. 24/7 contact phone numbers for assistance for urgent and routine care needs. 3. Service will only be billed when office clinical staff spend 20 minutes or more in a month to coordinate care. 4. Only one practitioner may furnish and bill the service in a calendar month. 5. The patient may stop CCM services at any time (effective at the end of the month) by phone call to the office staff. 6. The patient will be responsible for cost sharing (co-pay) of up to 20% of the service fee (after annual deductible is met).  Patient agreed to services and verbal consent obtained.   Plan:   The care management team will reach out to the patient again over the next 30 days.  The patient has been provided with contact information for the care management team and has been advised to call with any health related questions or concerns.   Merlene Morse Ashton Sabine RN, BSN Nurse Case Editor, commissioning [facility]  [phone]) Business Mobile

## 2019-04-17 NOTE — Assessment & Plan Note (Signed)
Doing well today. Will hold on any medications due to non-compliance. Await labs. Call with any concerns.

## 2019-04-17 NOTE — Assessment & Plan Note (Signed)
Hx of multiple myeloma, was on hospice. Will check labs. Await results.

## 2019-04-17 NOTE — Progress Notes (Signed)
BP 129/65   Pulse 71   Temp 98.6 F (37 C) (Oral)   Wt 170 lb (77.1 kg)   SpO2 96%   BMI 23.71 kg/m    Subjective:    Patient ID: [name], male    DOB: [birth year], 78 y.o.   MRN: [id number]  HPI: [name] is a 78 y.o. male who presents today after being on Hospice for about the last year. He has been stable, and has "aged out" of hospice and has come back to our care. He has not been taking any of his medicine. He has gained about 60lbs back. He is feeling well.   Chief Complaint  Patient presents with  . Reestablish care   DIABETES Hypoglycemic episodes:no Polydipsia/polyuria: no Visual disturbance: no Chest pain: no Paresthesias: yes Glucose Monitoring: no  Accucheck frequency: Not Checking Taking Insulin?: no Blood Pressure Monitoring: not checking Retinal Examination: Not up to Date Foot Exam: Not up to Date Diabetic Education: Not Completed Pneumovax: Up to Date Influenza: Not up to Date Aspirin: no  HYPOTHYROIDISM Thyroid control status:unknown Satisfied with current treatment? yes Medication side effects: not on anything Medication compliance: poor compliance Recent dose adjustment:no Fatigue: yes Cold intolerance: no Heat intolerance: no Weight gain: yes Weight loss: no Constipation: no Diarrhea/loose stools: no Palpitations: no Lower extremity edema: yes Anxiety/depressed mood: no  HYPERTENSION Hypertension status: controlled  Satisfied with current treatment? yes Duration of hypertension: chronic BP monitoring frequency:  not checking BP medication side effects:  Not on anything Medication compliance: poor compliance Previous BP meds: lisinopril 27m Aspirin: no Recurrent headaches: no Visual changes: no Palpitations: no Dyspnea: no Chest pain: no Lower extremity edema: yes Dizzy/lightheaded: no  NEUROPATHY- has been on morphine for his neuropathy, per PMP, takes it very rarely. Last Rx given 03/01/19. Tried to take  gabapentin and it made him really sleepy Neuropathy status: uncontrolled  Satisfied with current treatment?: no Medication side effects: not on anything Medication compliance:  poor compliance Location: bilateral hands Pain: yes Severity: severe  Quality:  Tingling and "Pain" Frequency: constant Bilateral: yes Symmetric: yes Numbness: yes Decreased sensation: yes Weakness: no Context: stable  Needs help at home. Recently discharged from hospice. Was getting help at home. Lives alone with his son. + weakness- cannot get out on his own. Needs home health.   Relevant past medical, surgical, family and social history reviewed and updated as indicated. Interim medical history since our last visit reviewed. Allergies and medications reviewed and updated.  Review of Systems  Constitutional: Positive for fatigue. Negative for activity change, appetite change, chills, diaphoresis, fever and unexpected weight change.  Respiratory: Negative.   Cardiovascular: Negative.   Gastrointestinal: Negative.   Neurological: Positive for numbness. Negative for dizziness, tremors, seizures, syncope, facial asymmetry, speech difficulty, weakness, light-headedness and headaches.  Hematological: Negative.   Psychiatric/Behavioral: Negative.     Per HPI unless specifically indicated above     Objective:    BP 129/65   Pulse 71   Temp 98.6 F (37 C) (Oral)   Wt 170 lb (77.1 kg)   SpO2 96%   BMI 23.71 kg/m   Wt Readings from Last 3 Encounters:  04/17/19 170 lb (77.1 kg)  06/15/18 110 lb 3.7 oz (50 kg)  06/05/18 122 lb 4.8 oz (55.5 kg)    Physical Exam Vitals signs and nursing note reviewed.  Constitutional:      General: He is not in acute distress.    Appearance: Normal appearance. He  is not ill-appearing, toxic-appearing or diaphoretic.  HENT:     Head: Normocephalic and atraumatic.     Right Ear: External ear normal.     Left Ear: External ear normal.     Nose: Nose normal.      Mouth/Throat:     Mouth: Mucous membranes are moist.     Pharynx: Oropharynx is clear.  Eyes:     General: No scleral icterus.       Right eye: No discharge.        Left eye: No discharge.     Extraocular Movements: Extraocular movements intact.     Conjunctiva/sclera: Conjunctivae normal.     Pupils: Pupils are equal, round, and reactive to light.  Neck:     Musculoskeletal: Normal range of motion and neck supple.  Cardiovascular:     Rate and Rhythm: Normal rate and regular rhythm.     Pulses: Normal pulses.     Heart sounds: Normal heart sounds. No murmur. No friction rub. No gallop.   Pulmonary:     Effort: Pulmonary effort is normal. No respiratory distress.     Breath sounds: Normal breath sounds. No stridor. No wheezing, rhonchi or rales.  Chest:     Chest wall: No tenderness.  Abdominal:     General: Abdomen is flat. Bowel sounds are normal. There is no distension.     Palpations: Abdomen is soft. There is no mass.     Tenderness: There is no abdominal tenderness. There is no right CVA tenderness, left CVA tenderness, guarding or rebound.     Hernia: No hernia is present.  Musculoskeletal: Normal range of motion.  Skin:    General: Skin is warm and dry.     Capillary Refill: Capillary refill takes less than 2 seconds.     Coloration: Skin is not jaundiced or pale.     Findings: No bruising, erythema, lesion or rash.  Neurological:     General: No focal deficit present.     Mental Status: He is alert and oriented to person, place, and time. Mental status is at baseline.  Psychiatric:        Mood and Affect: Mood normal.        Behavior: Behavior normal.        Thought Content: Thought content normal.        Judgment: Judgment normal.     Results for orders placed or performed during the hospital encounter of 06/15/18  Blood culture (routine x 2)   Specimen: BLOOD  Result Value Ref Range   Specimen Description BLOOD RIGHT FA    Special Requests      BOTTLES  DRAWN AEROBIC AND ANAEROBIC Blood Culture adequate volume   Culture      NO GROWTH 5 DAYS Performed at [facility], [address], [place], [place] [address]    Report Status 06/20/2018 FINAL   Blood culture (routine x 2)   Specimen: BLOOD  Result Value Ref Range   Specimen Description BLOOD LEFT HAND     Special Requests      BOTTLES DRAWN AEROBIC AND ANAEROBIC Blood Culture adequate volume   Culture      NO GROWTH 5 DAYS Performed at [facility], [address] [place], [place] [address]    Report Status 06/20/2018 FINAL   MRSA PCR Screening   Specimen: Nasal Mucosa; Nasopharyngeal  Result Value Ref Range   MRSA by PCR POSITIVE (A) NEGATIVE  Troponin I  Result Value  Ref Range   Troponin I <0.03 <0.03 ng/mL  CBC with Differential  Result Value Ref Range   WBC 6.4 3.8 - 10.6 K/uL   RBC 2.88 (L) 4.40 - 5.90 MIL/uL   Hemoglobin 10.6 (L) 13.0 - 18.0 g/dL   HCT 30.3 (L) 40.0 - 52.0 %   MCV 105.0 (H) 80.0 - 100.0 fL   MCH 36.9 (H) 26.0 - 34.0 pg   MCHC 35.1 32.0 - 36.0 g/dL   RDW 15.7 (H) 11.5 - 14.5 %   Platelets 147 (L) 150 - 440 K/uL   Neutrophils Relative % 78 %   Neutro Abs 5.0 1.4 - 6.5 K/uL   Lymphocytes Relative 14 %   Lymphs Abs 0.9 (L) 1.0 - 3.6 K/uL   Monocytes Relative 7 %   Monocytes Absolute 0.4 0.2 - 1.0 K/uL   Eosinophils Relative 1 %   Eosinophils Absolute 0.0 0 - 0.7 K/uL   Basophils Relative 0 %   Basophils Absolute 0.0 0 - 0.1 K/uL  Comprehensive metabolic panel  Result Value Ref Range   Sodium 143 135 - 145 mmol/L   Potassium 4.6 3.5 - 5.1 mmol/L   Chloride 111 98 - 111 mmol/L   CO2 26 22 - 32 mmol/L   Glucose, Bld 137 (H) 70 - 99 mg/dL   BUN 35 (H) 8 - 23 mg/dL   Creatinine, Ser 0.87 0.61 - 1.24 mg/dL   Calcium 9.7 8.9 - 10.3 mg/dL   Total Protein 5.7 (L) 6.5 - 8.1 g/dL   Albumin 3.8 3.5 - 5.0 g/dL   AST 39 15 - 41 U/L   ALT 41 0 - 44 U/L   Alkaline Phosphatase 118 38 - 126 U/L   Total Bilirubin 2.1 (H) 0.3 - 1.2  mg/dL   GFR calc non Af Amer >60 >60 mL/min   GFR calc Af Amer >60 >60 mL/min   Anion gap 6 5 - 15  Urinalysis, Complete w Microscopic  Result Value Ref Range   Color, Urine AMBER (A) YELLOW   APPearance CLEAR (A) CLEAR   Specific Gravity, Urine 1.020 1.005 - 1.030   pH 5.0 5.0 - 8.0   Glucose, UA NEGATIVE NEGATIVE mg/dL   Hgb urine dipstick NEGATIVE NEGATIVE   Bilirubin Urine NEGATIVE NEGATIVE   Ketones, ur NEGATIVE NEGATIVE mg/dL   Protein, ur NEGATIVE NEGATIVE mg/dL   Nitrite NEGATIVE NEGATIVE   Leukocytes, UA NEGATIVE NEGATIVE   RBC / HPF 0-5 0 - 5 RBC/hpf   WBC, UA 0-5 0 - 5 WBC/hpf   Bacteria, UA NONE SEEN NONE SEEN   Squamous Epithelial / LPF 0-5 0 - 5   Mucus PRESENT   TSH  Result Value Ref Range   TSH 3.350 0.350 - 4.500 uIU/mL  Ammonia  Result Value Ref Range   Ammonia 27 9 - 35 umol/L  Lactic acid, plasma  Result Value Ref Range   Lactic Acid, Venous 1.9 0.5 - 1.9 mmol/L  Lactic acid, plasma  Result Value Ref Range   Lactic Acid, Venous 0.9 0.5 - 1.9 mmol/L  Type and screen [place]  Result Value Ref Range   ABO/RH(D) A NEG    Antibody Screen NEG    Sample Expiration      06/18/2018 Performed at [facility], [address] [place], [place] [address]       Assessment & Plan:   Problem List Items Addressed This Visit      Cardiovascular and Mediastinum  PAD (peripheral artery disease) ([place])    Doesn't want to take medicine. Will hold on medications at this time. Continue to monitor.         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 Sugars doing great with A1c of 5.3.       Relevant Orders   CBC with Differential/Platelet   Comprehensive metabolic panel   UA/M w/rflx Culture, Routine   Hypothyroidism    Has not been on any thyroid medicine in over a year. Complains of fatigue and lack of energy. Will check labs and treat as needed. Call with any concerns.        Relevant Orders   CBC with Differential/Platelet   Comprehensive metabolic panel   TSH   RESOLVED: Type 2 diabetes, controlled, with neuropathy ([place]) - Primary    Sugars doing great today with A1c of 5.3- will resolve diabetes off his list. Continue to monitor. No need for more medicine at this time.       Relevant Orders   Bayer DCA Hb A1c Waived   CBC with Differential/Platelet   Comprehensive metabolic panel   Lipid Panel w/o Chol/HDL Ratio   Microalbumin, Urine Waived     Nervous and Auditory   Peripheral neuropathy    States that his hands hurt all the time. Not doing well. Will restart him on amitriptyline 45m qHS and recheck in 2 weeks. Call with any concerns. Continue to monitor- checking labs, thyroid and B12 may also be contributing.       Relevant Medications   amitriptyline (ELAVIL) 75 MG tablet   Other Relevant Orders   CBC with Differential/Platelet   Comprehensive metabolic panel     Genitourinary   Benign hypertensive renal disease    Doing well today. Will hold on any medications due to non-compliance. Await labs. Call with any concerns.       Relevant Orders   CBC with Differential/Platelet   Comprehensive metabolic panel   Microalbumin, Urine Waived     Hematopoietic and Hemostatic   [place]    Hx of multiple myeloma, was on hospice. Will check labs. Await results.         Other   Multiple myeloma ([place]    States that he doesn't believe he had cancer. Does not want to go back to see cancer center. Will recheck labs. Await results. Continue to monitor.       Relevant Medications   ciprofloxacin (CIPRO) 500 MG tablet   Other Relevant Orders   CBC with Differential/Platelet   Comprehensive metabolic panel   [id number]    Rechecking levels today. Await results. Treat as needed. May be contributing to his neuropathy.      Relevant Orders   CBC with Differential/Platelet   Comprehensive metabolic panel   BX77and Folate Panel     Other Visit Diagnoses    Acute cystitis without hematuria       2+ leuks and nitrate positive- not having too many symptoms, but will treat with cipro. Recheck 2 weeks. Call with any concerns.        Follow up plan: Return in about 2 weeks (around 05/01/2019) for follow up hand pain.

## 2019-04-17 NOTE — Assessment & Plan Note (Signed)
Doesn't want to take medicine. Will hold on medications at this time. Continue to monitor.

## 2019-04-18 LAB — CBC WITH DIFFERENTIAL/PLATELET
Basophils Absolute: 0 10*3/uL (ref 0.0–0.2)
Basos: 1 %
EOS (ABSOLUTE): 0.1 10*3/uL (ref 0.0–0.4)
Eos: 5 %
Hematocrit: 28.1 % — ABNORMAL LOW (ref 37.5–51.0)
Hemoglobin: 9.5 g/dL — ABNORMAL LOW (ref 13.0–17.7)
Immature Grans (Abs): 0 10*3/uL (ref 0.0–0.1)
Immature Granulocytes: 1 %
Lymphocytes Absolute: 0.5 10*3/uL — ABNORMAL LOW (ref 0.7–3.1)
Lymphs: 25 %
MCH: 33.7 pg — ABNORMAL HIGH (ref 26.6–33.0)
MCHC: 33.8 g/dL (ref 31.5–35.7)
MCV: 100 fL — ABNORMAL HIGH (ref 79–97)
Monocytes Absolute: 0.1 10*3/uL (ref 0.1–0.9)
Monocytes: 6 %
Neutrophils Absolute: 1.2 10*3/uL — ABNORMAL LOW (ref 1.4–7.0)
Neutrophils: 62 %
Platelets: 73 10*3/uL — CL (ref 150–450)
RBC: 2.82 x10E6/uL — ABNORMAL LOW (ref 4.14–5.80)
RDW: 15.5 % — ABNORMAL HIGH (ref 11.6–15.4)
WBC: 2 10*3/uL — CL (ref 3.4–10.8)

## 2019-04-18 LAB — COMPREHENSIVE METABOLIC PANEL
ALT: 15 IU/L (ref 0–44)
AST: 18 IU/L (ref 0–40)
Albumin/Globulin Ratio: 2.6 — ABNORMAL HIGH (ref 1.2–2.2)
Albumin: 3.7 g/dL (ref 3.7–4.7)
Alkaline Phosphatase: 107 IU/L (ref 39–117)
BUN/Creatinine Ratio: 12 (ref 10–24)
BUN: 8 mg/dL (ref 8–27)
Bilirubin Total: 0.8 mg/dL (ref 0.0–1.2)
CO2: 22 mmol/L (ref 20–29)
Calcium: 8.5 mg/dL — ABNORMAL LOW (ref 8.6–10.2)
Chloride: 108 mmol/L — ABNORMAL HIGH (ref 96–106)
Creatinine, Ser: 0.69 mg/dL — ABNORMAL LOW (ref 0.76–1.27)
GFR calc Af Amer: 106 mL/min/{1.73_m2} (ref >59)
GFR calc non Af Amer: 92 mL/min/{1.73_m2} (ref >59)
Globulin, Total: 1.4 g/dL — ABNORMAL LOW (ref 1.5–4.5)
Glucose: 187 mg/dL — ABNORMAL HIGH (ref 65–99)
Potassium: 3.4 mmol/L — ABNORMAL LOW (ref 3.5–5.2)
Sodium: 143 mmol/L (ref 134–144)
Total Protein: 5.1 g/dL — ABNORMAL LOW (ref 6.0–8.5)

## 2019-04-18 LAB — B12 AND FOLATE PANEL
Folate: 12.5 ng/mL (ref >3.0)
Vitamin B-12: 276 pg/mL (ref 232–1245)

## 2019-04-18 LAB — LIPID PANEL W/O CHOL/HDL RATIO
Cholesterol, Total: 132 mg/dL (ref 100–199)
HDL: 41 mg/dL (ref >39)
LDL Calculated: 76 mg/dL (ref 0–99)
Triglycerides: 76 mg/dL (ref 0–149)
VLDL Cholesterol Cal: 15 mg/dL (ref 5–40)

## 2019-04-18 LAB — TSH: TSH: 9.06 u[IU]/mL — ABNORMAL HIGH (ref 0.450–4.500)

## 2019-04-19 DIAGNOSIS — E1122 Type 2 diabetes mellitus with diabetic chronic kidney disease: Secondary | ICD-10-CM | POA: Diagnosis not present

## 2019-04-19 DIAGNOSIS — I129 Hypertensive chronic kidney disease with stage 1 through stage 4 chronic kidney disease, or unspecified chronic kidney disease: Secondary | ICD-10-CM | POA: Diagnosis not present

## 2019-04-19 DIAGNOSIS — R627 Adult failure to thrive: Secondary | ICD-10-CM | POA: Diagnosis not present

## 2019-04-19 DIAGNOSIS — D61818 Other pancytopenia: Secondary | ICD-10-CM | POA: Diagnosis not present

## 2019-04-19 DIAGNOSIS — E039 Hypothyroidism, unspecified: Secondary | ICD-10-CM | POA: Diagnosis not present

## 2019-04-19 DIAGNOSIS — E538 Deficiency of other specified B group vitamins: Secondary | ICD-10-CM | POA: Diagnosis not present

## 2019-04-19 DIAGNOSIS — Z9181 History of falling: Secondary | ICD-10-CM | POA: Diagnosis not present

## 2019-04-19 DIAGNOSIS — Z87891 Personal history of nicotine dependence: Secondary | ICD-10-CM | POA: Diagnosis not present

## 2019-04-19 DIAGNOSIS — N181 Chronic kidney disease, stage 1: Secondary | ICD-10-CM | POA: Diagnosis not present

## 2019-04-19 DIAGNOSIS — E1151 Type 2 diabetes mellitus with diabetic peripheral angiopathy without gangrene: Secondary | ICD-10-CM | POA: Diagnosis not present

## 2019-04-19 DIAGNOSIS — M6281 Muscle weakness (generalized): Secondary | ICD-10-CM | POA: Diagnosis not present

## 2019-04-19 DIAGNOSIS — C9000 Multiple myeloma not having achieved remission: Secondary | ICD-10-CM | POA: Diagnosis not present

## 2019-04-19 DIAGNOSIS — E1142 Type 2 diabetes mellitus with diabetic polyneuropathy: Secondary | ICD-10-CM | POA: Diagnosis not present

## 2019-04-19 DIAGNOSIS — Z79891 Long term (current) use of opiate analgesic: Secondary | ICD-10-CM | POA: Diagnosis not present

## 2019-04-19 NOTE — Patient Instructions (Signed)
Thank you allowing the Chronic Care Management Team to be a part of your care! It was a pleasure speaking with you today!  CCM (Chronic Care Management) Team   Almeta Geisel RN, BSN Nurse Care Coordinator  [phone]  [facility] PharmD  Clinical Pharmacist  [phone]  Eula Fried LCSW Clinical Social Worker [phone]  Goals Addressed            This Visit's Progress   . My hands hurt and hospice is not coming anymore (pt-stated)       Current Barriers:  Marland Kitchen Knowledge Deficits related to chronic pain and care giving needs . Film/video editor.   Nurse Case Manager Clinical Goal(s):  Marland Kitchen Over the next 90 days, patient will work with [facility] to address needs related to Chronic pain and care giving needs.  Interventions:  . Reviewed medications with patient and discussed pain to his hands and the patient stating the gabapentin not helpful . Collaborated with [facility] Nurse with [facility] regarding patient's home situation . She states son does well caring for his dad managing his medications and providing meals. She stated the patient was evaluated by hospice SW and was deemed in financial need. She also stated patient was noncompliant with gabapentin.  . Discussed plans with patient for ongoing care management follow up and provided patient with direct contact information for care management team . Care Guide referral for for food pantry resources  . Social Work referral for financial evaluation related to patient's son stating he needed help buying cleaning supplies.   Patient Self Care Activities:  . Currently UNABLE TO independently manage his chronic pain/care needs  Initial goal documentation        The patient verbalized understanding of instructions provided today and declined a print copy of patient instruction materials.   The patient has been provided with contact information for the care management team and has been advised to call with any  health related questions or concerns.

## 2019-04-20 DIAGNOSIS — Z87891 Personal history of nicotine dependence: Secondary | ICD-10-CM | POA: Diagnosis not present

## 2019-04-20 DIAGNOSIS — Z79891 Long term (current) use of opiate analgesic: Secondary | ICD-10-CM | POA: Diagnosis not present

## 2019-04-20 DIAGNOSIS — E1151 Type 2 diabetes mellitus with diabetic peripheral angiopathy without gangrene: Secondary | ICD-10-CM | POA: Diagnosis not present

## 2019-04-20 DIAGNOSIS — R627 Adult failure to thrive: Secondary | ICD-10-CM | POA: Diagnosis not present

## 2019-04-20 DIAGNOSIS — E039 Hypothyroidism, unspecified: Secondary | ICD-10-CM | POA: Diagnosis not present

## 2019-04-20 DIAGNOSIS — E1122 Type 2 diabetes mellitus with diabetic chronic kidney disease: Secondary | ICD-10-CM | POA: Diagnosis not present

## 2019-04-20 DIAGNOSIS — D61818 Other pancytopenia: Secondary | ICD-10-CM | POA: Diagnosis not present

## 2019-04-20 DIAGNOSIS — I129 Hypertensive chronic kidney disease with stage 1 through stage 4 chronic kidney disease, or unspecified chronic kidney disease: Secondary | ICD-10-CM | POA: Diagnosis not present

## 2019-04-20 DIAGNOSIS — C9000 Multiple myeloma not having achieved remission: Secondary | ICD-10-CM | POA: Diagnosis not present

## 2019-04-20 DIAGNOSIS — E1142 Type 2 diabetes mellitus with diabetic polyneuropathy: Secondary | ICD-10-CM | POA: Diagnosis not present

## 2019-04-20 DIAGNOSIS — E538 Deficiency of other specified B group vitamins: Secondary | ICD-10-CM | POA: Diagnosis not present

## 2019-04-20 DIAGNOSIS — Z9181 History of falling: Secondary | ICD-10-CM | POA: Diagnosis not present

## 2019-04-20 DIAGNOSIS — N181 Chronic kidney disease, stage 1: Secondary | ICD-10-CM | POA: Diagnosis not present

## 2019-04-20 DIAGNOSIS — M6281 Muscle weakness (generalized): Secondary | ICD-10-CM | POA: Diagnosis not present

## 2019-04-20 LAB — UA/M W/RFLX CULTURE, ROUTINE
Bilirubin, UA: NEGATIVE
Glucose, UA: NEGATIVE
Ketones, UA: NEGATIVE
Nitrite, UA: POSITIVE — AB
RBC, UA: NEGATIVE
Specific Gravity, UA: 1.02 (ref 1.005–1.030)
Urobilinogen, Ur: 4 mg/dL — ABNORMAL HIGH (ref 0.2–1.0)
pH, UA: 5.5 (ref 5.0–7.5)

## 2019-04-20 LAB — MICROSCOPIC EXAMINATION: RBC: NONE SEEN /hpf (ref 0–2)

## 2019-04-20 LAB — MICROALBUMIN, URINE WAIVED
Creatinine, Urine Waived: 100 mg/dL (ref 10–300)
Microalb, Ur Waived: 150 mg/L — ABNORMAL HIGH (ref 0–19)

## 2019-04-20 LAB — BAYER DCA HB A1C WAIVED: HB A1C (BAYER DCA - WAIVED): 5.3 % (ref <7.0)

## 2019-04-20 LAB — URINE CULTURE, REFLEX

## 2019-04-22 ENCOUNTER — Telehealth: Payer: Self-pay | Admitting: Family Medicine

## 2019-04-22 NOTE — Telephone Encounter (Signed)
Please let him know that his blood count is low again. I would advise him to see one of the blood doctors at the cancer center, but I know he didn't want to last time. If he changes his mind, let me know and I'll get him in to see them.   His thyroid is also not where it's supposed to be. Please double check to make sure he's been taking his medicine. If he is not, we'll recheck in 1 month, but if he is taking it, I'll increase his dose.   Everything looks stable. Let me know if he has any questions.

## 2019-04-23 DIAGNOSIS — R627 Adult failure to thrive: Secondary | ICD-10-CM | POA: Diagnosis not present

## 2019-04-23 DIAGNOSIS — E039 Hypothyroidism, unspecified: Secondary | ICD-10-CM | POA: Diagnosis not present

## 2019-04-23 DIAGNOSIS — Z9181 History of falling: Secondary | ICD-10-CM | POA: Diagnosis not present

## 2019-04-23 DIAGNOSIS — E538 Deficiency of other specified B group vitamins: Secondary | ICD-10-CM | POA: Diagnosis not present

## 2019-04-23 DIAGNOSIS — I129 Hypertensive chronic kidney disease with stage 1 through stage 4 chronic kidney disease, or unspecified chronic kidney disease: Secondary | ICD-10-CM | POA: Diagnosis not present

## 2019-04-23 DIAGNOSIS — Z79891 Long term (current) use of opiate analgesic: Secondary | ICD-10-CM | POA: Diagnosis not present

## 2019-04-23 DIAGNOSIS — C9000 Multiple myeloma not having achieved remission: Secondary | ICD-10-CM | POA: Diagnosis not present

## 2019-04-23 DIAGNOSIS — E1142 Type 2 diabetes mellitus with diabetic polyneuropathy: Secondary | ICD-10-CM | POA: Diagnosis not present

## 2019-04-23 DIAGNOSIS — E1122 Type 2 diabetes mellitus with diabetic chronic kidney disease: Secondary | ICD-10-CM | POA: Diagnosis not present

## 2019-04-23 DIAGNOSIS — D61818 Other pancytopenia: Secondary | ICD-10-CM | POA: Diagnosis not present

## 2019-04-23 DIAGNOSIS — N181 Chronic kidney disease, stage 1: Secondary | ICD-10-CM | POA: Diagnosis not present

## 2019-04-23 DIAGNOSIS — Z87891 Personal history of nicotine dependence: Secondary | ICD-10-CM | POA: Diagnosis not present

## 2019-04-23 DIAGNOSIS — E1151 Type 2 diabetes mellitus with diabetic peripheral angiopathy without gangrene: Secondary | ICD-10-CM | POA: Diagnosis not present

## 2019-04-23 DIAGNOSIS — M6281 Muscle weakness (generalized): Secondary | ICD-10-CM | POA: Diagnosis not present

## 2019-04-23 NOTE — Telephone Encounter (Signed)
Called and spoke with patient's son [name], he states that the patient is not on thyroid medication and has not been for a while. He also states that his dad does not want to take the antibiotic because it makes him tired.

## 2019-04-23 NOTE — Telephone Encounter (Signed)
Patient also is not taking the amitriptyline daily, he will take it sometimes.

## 2019-04-24 DIAGNOSIS — Z9181 History of falling: Secondary | ICD-10-CM | POA: Diagnosis not present

## 2019-04-24 DIAGNOSIS — D61818 Other pancytopenia: Secondary | ICD-10-CM | POA: Diagnosis not present

## 2019-04-24 DIAGNOSIS — Z79891 Long term (current) use of opiate analgesic: Secondary | ICD-10-CM | POA: Diagnosis not present

## 2019-04-24 DIAGNOSIS — N181 Chronic kidney disease, stage 1: Secondary | ICD-10-CM | POA: Diagnosis not present

## 2019-04-24 DIAGNOSIS — M6281 Muscle weakness (generalized): Secondary | ICD-10-CM | POA: Diagnosis not present

## 2019-04-24 DIAGNOSIS — E039 Hypothyroidism, unspecified: Secondary | ICD-10-CM | POA: Diagnosis not present

## 2019-04-24 DIAGNOSIS — I129 Hypertensive chronic kidney disease with stage 1 through stage 4 chronic kidney disease, or unspecified chronic kidney disease: Secondary | ICD-10-CM | POA: Diagnosis not present

## 2019-04-24 DIAGNOSIS — Z87891 Personal history of nicotine dependence: Secondary | ICD-10-CM | POA: Diagnosis not present

## 2019-04-24 DIAGNOSIS — E1151 Type 2 diabetes mellitus with diabetic peripheral angiopathy without gangrene: Secondary | ICD-10-CM | POA: Diagnosis not present

## 2019-04-24 DIAGNOSIS — E1122 Type 2 diabetes mellitus with diabetic chronic kidney disease: Secondary | ICD-10-CM | POA: Diagnosis not present

## 2019-04-24 DIAGNOSIS — E538 Deficiency of other specified B group vitamins: Secondary | ICD-10-CM | POA: Diagnosis not present

## 2019-04-24 DIAGNOSIS — R627 Adult failure to thrive: Secondary | ICD-10-CM | POA: Diagnosis not present

## 2019-04-24 DIAGNOSIS — E1142 Type 2 diabetes mellitus with diabetic polyneuropathy: Secondary | ICD-10-CM | POA: Diagnosis not present

## 2019-04-24 DIAGNOSIS — C9000 Multiple myeloma not having achieved remission: Secondary | ICD-10-CM | POA: Diagnosis not present

## 2019-04-26 DIAGNOSIS — R627 Adult failure to thrive: Secondary | ICD-10-CM | POA: Diagnosis not present

## 2019-04-26 DIAGNOSIS — E1142 Type 2 diabetes mellitus with diabetic polyneuropathy: Secondary | ICD-10-CM | POA: Diagnosis not present

## 2019-04-26 DIAGNOSIS — N181 Chronic kidney disease, stage 1: Secondary | ICD-10-CM | POA: Diagnosis not present

## 2019-04-26 DIAGNOSIS — Z79891 Long term (current) use of opiate analgesic: Secondary | ICD-10-CM | POA: Diagnosis not present

## 2019-04-26 DIAGNOSIS — E1122 Type 2 diabetes mellitus with diabetic chronic kidney disease: Secondary | ICD-10-CM | POA: Diagnosis not present

## 2019-04-26 DIAGNOSIS — C9000 Multiple myeloma not having achieved remission: Secondary | ICD-10-CM | POA: Diagnosis not present

## 2019-04-26 DIAGNOSIS — E1151 Type 2 diabetes mellitus with diabetic peripheral angiopathy without gangrene: Secondary | ICD-10-CM | POA: Diagnosis not present

## 2019-04-26 DIAGNOSIS — I129 Hypertensive chronic kidney disease with stage 1 through stage 4 chronic kidney disease, or unspecified chronic kidney disease: Secondary | ICD-10-CM | POA: Diagnosis not present

## 2019-04-26 DIAGNOSIS — M6281 Muscle weakness (generalized): Secondary | ICD-10-CM | POA: Diagnosis not present

## 2019-04-26 DIAGNOSIS — D61818 Other pancytopenia: Secondary | ICD-10-CM | POA: Diagnosis not present

## 2019-04-26 DIAGNOSIS — E039 Hypothyroidism, unspecified: Secondary | ICD-10-CM | POA: Diagnosis not present

## 2019-04-26 DIAGNOSIS — E538 Deficiency of other specified B group vitamins: Secondary | ICD-10-CM | POA: Diagnosis not present

## 2019-04-26 DIAGNOSIS — Z87891 Personal history of nicotine dependence: Secondary | ICD-10-CM | POA: Diagnosis not present

## 2019-04-26 DIAGNOSIS — Z9181 History of falling: Secondary | ICD-10-CM | POA: Diagnosis not present

## 2019-04-27 DIAGNOSIS — Z87891 Personal history of nicotine dependence: Secondary | ICD-10-CM | POA: Diagnosis not present

## 2019-04-27 DIAGNOSIS — D61818 Other pancytopenia: Secondary | ICD-10-CM | POA: Diagnosis not present

## 2019-04-27 DIAGNOSIS — N181 Chronic kidney disease, stage 1: Secondary | ICD-10-CM | POA: Diagnosis not present

## 2019-04-27 DIAGNOSIS — E1122 Type 2 diabetes mellitus with diabetic chronic kidney disease: Secondary | ICD-10-CM | POA: Diagnosis not present

## 2019-04-27 DIAGNOSIS — E039 Hypothyroidism, unspecified: Secondary | ICD-10-CM | POA: Diagnosis not present

## 2019-04-27 DIAGNOSIS — Z79891 Long term (current) use of opiate analgesic: Secondary | ICD-10-CM | POA: Diagnosis not present

## 2019-04-27 DIAGNOSIS — I129 Hypertensive chronic kidney disease with stage 1 through stage 4 chronic kidney disease, or unspecified chronic kidney disease: Secondary | ICD-10-CM | POA: Diagnosis not present

## 2019-04-27 DIAGNOSIS — E1142 Type 2 diabetes mellitus with diabetic polyneuropathy: Secondary | ICD-10-CM | POA: Diagnosis not present

## 2019-04-27 DIAGNOSIS — E1151 Type 2 diabetes mellitus with diabetic peripheral angiopathy without gangrene: Secondary | ICD-10-CM | POA: Diagnosis not present

## 2019-04-27 DIAGNOSIS — E538 Deficiency of other specified B group vitamins: Secondary | ICD-10-CM | POA: Diagnosis not present

## 2019-04-27 DIAGNOSIS — Z9181 History of falling: Secondary | ICD-10-CM | POA: Diagnosis not present

## 2019-04-27 DIAGNOSIS — M6281 Muscle weakness (generalized): Secondary | ICD-10-CM | POA: Diagnosis not present

## 2019-04-27 DIAGNOSIS — C9000 Multiple myeloma not having achieved remission: Secondary | ICD-10-CM | POA: Diagnosis not present

## 2019-04-27 DIAGNOSIS — R627 Adult failure to thrive: Secondary | ICD-10-CM | POA: Diagnosis not present

## 2019-04-30 DIAGNOSIS — N181 Chronic kidney disease, stage 1: Secondary | ICD-10-CM | POA: Diagnosis not present

## 2019-04-30 DIAGNOSIS — Z87891 Personal history of nicotine dependence: Secondary | ICD-10-CM | POA: Diagnosis not present

## 2019-04-30 DIAGNOSIS — C9000 Multiple myeloma not having achieved remission: Secondary | ICD-10-CM | POA: Diagnosis not present

## 2019-04-30 DIAGNOSIS — Z79891 Long term (current) use of opiate analgesic: Secondary | ICD-10-CM | POA: Diagnosis not present

## 2019-04-30 DIAGNOSIS — E538 Deficiency of other specified B group vitamins: Secondary | ICD-10-CM | POA: Diagnosis not present

## 2019-04-30 DIAGNOSIS — M6281 Muscle weakness (generalized): Secondary | ICD-10-CM | POA: Diagnosis not present

## 2019-04-30 DIAGNOSIS — I129 Hypertensive chronic kidney disease with stage 1 through stage 4 chronic kidney disease, or unspecified chronic kidney disease: Secondary | ICD-10-CM | POA: Diagnosis not present

## 2019-04-30 DIAGNOSIS — R627 Adult failure to thrive: Secondary | ICD-10-CM | POA: Diagnosis not present

## 2019-04-30 DIAGNOSIS — E1142 Type 2 diabetes mellitus with diabetic polyneuropathy: Secondary | ICD-10-CM | POA: Diagnosis not present

## 2019-04-30 DIAGNOSIS — E039 Hypothyroidism, unspecified: Secondary | ICD-10-CM | POA: Diagnosis not present

## 2019-04-30 DIAGNOSIS — E1151 Type 2 diabetes mellitus with diabetic peripheral angiopathy without gangrene: Secondary | ICD-10-CM | POA: Diagnosis not present

## 2019-04-30 DIAGNOSIS — D61818 Other pancytopenia: Secondary | ICD-10-CM | POA: Diagnosis not present

## 2019-04-30 DIAGNOSIS — E1122 Type 2 diabetes mellitus with diabetic chronic kidney disease: Secondary | ICD-10-CM | POA: Diagnosis not present

## 2019-04-30 DIAGNOSIS — Z9181 History of falling: Secondary | ICD-10-CM | POA: Diagnosis not present

## 2019-05-01 ENCOUNTER — Encounter: Payer: Self-pay | Admitting: Family Medicine

## 2019-05-01 ENCOUNTER — Ambulatory Visit (INDEPENDENT_AMBULATORY_CARE_PROVIDER_SITE_OTHER): Payer: Medicare Other | Admitting: Family Medicine

## 2019-05-01 ENCOUNTER — Other Ambulatory Visit: Payer: Self-pay

## 2019-05-01 VITALS — BP 131/66 | HR 80 | Temp 98.4°F | Wt 166.0 lb

## 2019-05-01 DIAGNOSIS — E039 Hypothyroidism, unspecified: Secondary | ICD-10-CM

## 2019-05-01 DIAGNOSIS — E538 Deficiency of other specified B group vitamins: Secondary | ICD-10-CM

## 2019-05-01 DIAGNOSIS — D61818 Other pancytopenia: Secondary | ICD-10-CM

## 2019-05-01 DIAGNOSIS — G629 Polyneuropathy, unspecified: Secondary | ICD-10-CM

## 2019-05-01 DIAGNOSIS — N3000 Acute cystitis without hematuria: Secondary | ICD-10-CM

## 2019-05-01 LAB — UA/M W/RFLX CULTURE, ROUTINE
Bilirubin, UA: NEGATIVE
Glucose, UA: NEGATIVE
Ketones, UA: NEGATIVE
Leukocytes,UA: NEGATIVE
Nitrite, UA: NEGATIVE
Protein,UA: NEGATIVE
RBC, UA: NEGATIVE
Specific Gravity, UA: 1.01 (ref 1.005–1.030)
Urobilinogen, Ur: 1 mg/dL (ref 0.2–1.0)
pH, UA: 6 (ref 5.0–7.5)

## 2019-05-01 MED ORDER — CYANOCOBALAMIN 1000 MCG/ML IJ SOLN
1000.0000 ug | INTRAMUSCULAR | Status: AC
  Administered 2020-03-12: 1000 ug via INTRAMUSCULAR

## 2019-05-01 MED ORDER — AMITRIPTYLINE HCL 150 MG PO TABS: 150.0000 mg | ORAL_TABLET | Freq: Every day | ORAL | 3 refills | Status: DC

## 2019-05-01 MED ORDER — LEVOTHYROXINE SODIUM 25 MCG PO TABS: 25.0000 ug | ORAL_TABLET | Freq: Every day | ORAL | 3 refills | Status: DC

## 2019-05-01 NOTE — Assessment & Plan Note (Signed)
Doing a little better with the 75mg  amitriptyline, but not great. Will increase to 150mg  and recheck 6 weeks. Call with any concerns.

## 2019-05-01 NOTE — Assessment & Plan Note (Signed)
Borderline low. Will start B12 shots when he comes in. Will give B12 shot next time he comes in.

## 2019-05-01 NOTE — Telephone Encounter (Signed)
Discussed today at appointment.  

## 2019-05-01 NOTE — Progress Notes (Signed)
BP 131/66   Pulse 80   Temp 98.4 F (36.9 C) (Oral)   Wt 166 lb (75.3 kg)   SpO2 98%   BMI 23.15 kg/m    Subjective:    Patient ID: [name], male    DOB: Jan 13, 1941, 78 y.o.   MRN: [id number]  HPI: [name] is a 78 y.o. male  Chief Complaint  Patient presents with  . Hand Pain    pt states his hands still hurt   HYPOTHYROIDISM- has been off medicine for several years Thyroid control status:uncontrolled Satisfied with current treatment? no Medication side effects: no Medication compliance: poor compliance Recent dose adjustment:no Fatigue: yes Cold intolerance: no Heat intolerance: no Weight gain: no Weight loss: no Constipation: no Diarrhea/loose stools: no Palpitations: no Lower extremity edema: no Anxiety/depressed mood: no  NEUROPATHY Neuropathy status: better  Satisfied with current treatment?: no Medication side effects: no Medication compliance:  fair compliance Location:  Pain: yes Severity: moderate  Quality:  Numb and tingling Frequency: constant Bilateral: yes Symmetric: yes Numbness: yes Decreased sensation: no Weakness: no Context: better  Finished his abx for his UTI, but took it more slowly.  Relevant past medical, surgical, family and social history reviewed and updated as indicated. Interim medical history since our last visit reviewed. Allergies and medications reviewed and updated.  Review of Systems  Constitutional: Positive for fatigue. Negative for activity change, appetite change, chills, diaphoresis, fever and unexpected weight change.  Respiratory: Negative.   Cardiovascular: Negative.   Gastrointestinal: Negative.   Musculoskeletal: Positive for myalgias. Negative for arthralgias, back pain, gait problem, joint swelling, neck pain and neck stiffness.  Skin: Negative.   Neurological: Positive for numbness. Negative for dizziness, tremors, seizures, syncope, facial asymmetry, speech difficulty, weakness,  light-headedness and headaches.  Hematological: Negative.   Psychiatric/Behavioral: Negative.     Per HPI unless specifically indicated above     Objective:    BP 131/66   Pulse 80   Temp 98.4 F (36.9 C) (Oral)   Wt 166 lb (75.3 kg)   SpO2 98%   BMI 23.15 kg/m   Wt Readings from Last 3 Encounters:  05/01/19 166 lb (75.3 kg)  04/17/19 170 lb (77.1 kg)  06/15/18 110 lb 3.7 oz (50 kg)    Physical Exam Vitals signs and nursing note reviewed.  Constitutional:      General: He is not in acute distress.    Appearance: Normal appearance. He is not ill-appearing, toxic-appearing or diaphoretic.  HENT:     Head: Normocephalic and atraumatic.     Right Ear: External ear normal.     Left Ear: External ear normal.     Nose: Nose normal.     Mouth/Throat:     Mouth: Mucous membranes are moist.     Pharynx: Oropharynx is clear.  Eyes:     General: No scleral icterus.       Right eye: No discharge.        Left eye: No discharge.     Extraocular Movements: Extraocular movements intact.     Conjunctiva/sclera: Conjunctivae normal.     Pupils: Pupils are equal, round, and reactive to light.  Neck:     Musculoskeletal: Normal range of motion and neck supple.  Cardiovascular:     Rate and Rhythm: Normal rate and regular rhythm.     Pulses: Normal pulses.     Heart sounds: Normal heart sounds. No murmur. No friction rub. No gallop.   Pulmonary:  Effort: Pulmonary effort is normal. No respiratory distress.     Breath sounds: Normal breath sounds. No stridor. No wheezing, rhonchi or rales.  Chest:     Chest wall: No tenderness.  Musculoskeletal: Normal range of motion.  Skin:    General: Skin is warm and dry.     Capillary Refill: Capillary refill takes less than 2 seconds.     Coloration: Skin is not jaundiced or pale.     Findings: No bruising, erythema, lesion or rash.  Neurological:     General: No focal deficit present.     Mental Status: He is alert and oriented to  person, place, and time. Mental status is at baseline.  Psychiatric:        Mood and Affect: Mood normal.        Behavior: Behavior normal.        Thought Content: Thought content normal.        Judgment: Judgment normal.     Results for orders placed or performed in visit on 04/17/19  Microscopic Examination   BLD  Result Value Ref Range   WBC, UA 6-10 (A) 0 - 5 /hpf   RBC None seen 0 - 2 /hpf   Epithelial Cells (non renal) 0-10 0 - 10 /hpf   Casts Present None seen /lpf   Cast Type Hyaline casts N/A   Crystals Present N/A   Crystal Type Amorphous Sediment N/A   Bacteria, UA Few (A) None seen/Few  Urine Culture, Reflex   BLD  Result Value Ref Range   Urine Culture, Routine Final report (A)    Organism ID, Bacteria Comment (A)    ORGANISM ID, BACTERIA Comment    Antimicrobial Susceptibility Comment   Bayer DCA Hb A1c Waived  Result Value Ref Range   HB A1C (BAYER DCA - WAIVED) 5.3 <7.0 %  CBC with Differential/Platelet  Result Value Ref Range   WBC 2.0 (LL) 3.4 - 10.8 x10E3/uL   RBC 2.82 (L) 4.14 - 5.80 x10E6/uL   Hemoglobin 9.5 (L) 13.0 - 17.7 g/dL   Hematocrit 28.1 (L) 37.5 - 51.0 %   MCV 100 (H) 79 - 97 fL   MCH 33.7 (H) 26.6 - 33.0 pg   MCHC 33.8 31.5 - 35.7 g/dL   RDW 15.5 (H) 11.6 - 15.4 %   Platelets 73 (LL) 150 - 450 x10E3/uL   Neutrophils 62 Not Estab. %   Lymphs 25 Not Estab. %   Monocytes 6 Not Estab. %   Eos 5 Not Estab. %   Basos 1 Not Estab. %   Neutrophils Absolute 1.2 (L) 1.4 - 7.0 x10E3/uL   Lymphocytes Absolute 0.5 (L) 0.7 - 3.1 x10E3/uL   Monocytes Absolute 0.1 0.1 - 0.9 x10E3/uL   EOS (ABSOLUTE) 0.1 0.0 - 0.4 x10E3/uL   Basophils Absolute 0.0 0.0 - 0.2 x10E3/uL   Immature Granulocytes 1 Not Estab. %   Immature Grans (Abs) 0.0 0.0 - 0.1 x10E3/uL   Hematology Comments: Note:   Comprehensive metabolic panel  Result Value Ref Range   Glucose 187 (H) 65 - 99 mg/dL   BUN 8 8 - 27 mg/dL   Creatinine, Ser 0.69 (L) 0.76 - 1.27 mg/dL   GFR calc  non Af Amer 92 >59 mL/min/1.73   GFR calc Af Amer 106 >59 mL/min/1.73   BUN/Creatinine Ratio 12 10 - 24   Sodium 143 134 - 144 mmol/L   Potassium 3.4 (L) 3.5 - 5.2 mmol/L   Chloride 108 (H) 96 -  106 mmol/L   CO2 22 20 - 29 mmol/L   Calcium 8.5 (L) 8.6 - 10.2 mg/dL   Total Protein 5.1 (L) 6.0 - 8.5 g/dL   Albumin 3.7 3.7 - 4.7 g/dL   Globulin, Total 1.4 (L) 1.5 - 4.5 g/dL   Albumin/Globulin Ratio 2.6 (H) 1.2 - 2.2   Bilirubin Total 0.8 0.0 - 1.2 mg/dL   Alkaline Phosphatase 107 39 - 117 IU/L   AST 18 0 - 40 IU/L   ALT 15 0 - 44 IU/L  Lipid Panel w/o Chol/HDL Ratio  Result Value Ref Range   Cholesterol, Total 132 100 - 199 mg/dL   Triglycerides 76 0 - 149 mg/dL   HDL 41 >39 mg/dL   VLDL Cholesterol Cal 15 5 - 40 mg/dL   LDL Calculated 76 0 - 99 mg/dL  Microalbumin, Urine Waived  Result Value Ref Range   Microalb, Ur Waived 150 (H) 0 - 19 mg/L   Creatinine, Urine Waived 100 10 - 300 mg/dL   Microalb/Creat Ratio 30-300 (H) <30 mg/g  TSH  Result Value Ref Range   TSH 9.060 (H) 0.450 - 4.500 uIU/mL  UA/M w/rflx Culture, Routine   Specimen: Blood   BLD  Result Value Ref Range   Specific Gravity, UA 1.020 1.005 - 1.030   pH, UA 5.5 5.0 - 7.5   Color, UA Yellow Yellow   Appearance Ur Hazy (A) Clear   Leukocytes,UA 2+ (A) Negative   Protein,UA 1+ (A) Negative/Trace   Glucose, UA Negative Negative   Ketones, UA Negative Negative   RBC, UA Negative Negative   Bilirubin, UA Negative Negative   Urobilinogen, Ur 4.0 (H) 0.2 - 1.0 mg/dL   Nitrite, UA Positive (A) Negative   Microscopic Examination See below:    Urinalysis Reflex Comment   B12 and Folate Panel  Result Value Ref Range   Vitamin B-12 276 232 - 1,245 pg/mL   Folate 12.5 >3.0 ng/mL      Assessment & Plan:   Problem List Items Addressed This Visit      Endocrine   Hypothyroidism    Will restart his levothyroxine and recheck 6 weeks. Call with any concerns. Continue to monitor.       Relevant Medications    levothyroxine (SYNTHROID) 25 MCG tablet   Other Relevant Orders   TSH     Nervous and Auditory   Peripheral neuropathy - Primary    Doing a little better with the 75mg  amitriptyline, but not great. Will increase to 150mg  and recheck 6 weeks. Call with any concerns.       Relevant Medications   amitriptyline (ELAVIL) 150 MG tablet   Other Relevant Orders   Amitriptyline level     Hematopoietic and Hemostatic   Pancytopenia (HCC)   Relevant Orders   CBC with Differential/Platelet     Other   B12 deficiency    Borderline low. Will start B12 shots when he comes in. Will give B12 shot next time he comes in.       Relevant Medications   cyanocobalamin ((VITAMIN B-12)) injection 1,000 mcg (Start on 06/12/2019 12:00 AM)    Other Visit Diagnoses    Acute cystitis without hematuria       Will check UA and treat as needed. Call with any concerns.    Relevant Orders   UA/M w/rflx Culture, Routine       Follow up plan: Return in about 6 weeks (around 06/12/2019) for follow up thyroid and neuropathy.

## 2019-05-01 NOTE — Assessment & Plan Note (Signed)
Will restart his levothyroxine and recheck 6 weeks. Call with any concerns. Continue to monitor.

## 2019-05-02 ENCOUNTER — Telehealth: Payer: Self-pay | Admitting: Family Medicine

## 2019-05-02 DIAGNOSIS — E1142 Type 2 diabetes mellitus with diabetic polyneuropathy: Secondary | ICD-10-CM | POA: Diagnosis not present

## 2019-05-02 DIAGNOSIS — Z87891 Personal history of nicotine dependence: Secondary | ICD-10-CM | POA: Diagnosis not present

## 2019-05-02 DIAGNOSIS — E1151 Type 2 diabetes mellitus with diabetic peripheral angiopathy without gangrene: Secondary | ICD-10-CM | POA: Diagnosis not present

## 2019-05-02 DIAGNOSIS — E039 Hypothyroidism, unspecified: Secondary | ICD-10-CM | POA: Diagnosis not present

## 2019-05-02 DIAGNOSIS — R627 Adult failure to thrive: Secondary | ICD-10-CM | POA: Diagnosis not present

## 2019-05-02 DIAGNOSIS — Z79891 Long term (current) use of opiate analgesic: Secondary | ICD-10-CM | POA: Diagnosis not present

## 2019-05-02 DIAGNOSIS — D61818 Other pancytopenia: Secondary | ICD-10-CM | POA: Diagnosis not present

## 2019-05-02 DIAGNOSIS — N181 Chronic kidney disease, stage 1: Secondary | ICD-10-CM | POA: Diagnosis not present

## 2019-05-02 DIAGNOSIS — C9000 Multiple myeloma not having achieved remission: Secondary | ICD-10-CM | POA: Diagnosis not present

## 2019-05-02 DIAGNOSIS — I129 Hypertensive chronic kidney disease with stage 1 through stage 4 chronic kidney disease, or unspecified chronic kidney disease: Secondary | ICD-10-CM | POA: Diagnosis not present

## 2019-05-02 DIAGNOSIS — Z9181 History of falling: Secondary | ICD-10-CM | POA: Diagnosis not present

## 2019-05-02 DIAGNOSIS — E1122 Type 2 diabetes mellitus with diabetic chronic kidney disease: Secondary | ICD-10-CM | POA: Diagnosis not present

## 2019-05-02 DIAGNOSIS — M6281 Muscle weakness (generalized): Secondary | ICD-10-CM | POA: Diagnosis not present

## 2019-05-02 DIAGNOSIS — E538 Deficiency of other specified B group vitamins: Secondary | ICD-10-CM | POA: Diagnosis not present

## 2019-05-02 NOTE — Telephone Encounter (Signed)
Called and left a message.

## 2019-05-02 NOTE — Telephone Encounter (Signed)
Please let him know that his urine is back to normal. Thanks!

## 2019-05-03 DIAGNOSIS — M6281 Muscle weakness (generalized): Secondary | ICD-10-CM | POA: Diagnosis not present

## 2019-05-03 DIAGNOSIS — D61818 Other pancytopenia: Secondary | ICD-10-CM | POA: Diagnosis not present

## 2019-05-03 DIAGNOSIS — R627 Adult failure to thrive: Secondary | ICD-10-CM | POA: Diagnosis not present

## 2019-05-03 DIAGNOSIS — E538 Deficiency of other specified B group vitamins: Secondary | ICD-10-CM | POA: Diagnosis not present

## 2019-05-03 DIAGNOSIS — N181 Chronic kidney disease, stage 1: Secondary | ICD-10-CM | POA: Diagnosis not present

## 2019-05-03 DIAGNOSIS — E1151 Type 2 diabetes mellitus with diabetic peripheral angiopathy without gangrene: Secondary | ICD-10-CM | POA: Diagnosis not present

## 2019-05-03 DIAGNOSIS — Z87891 Personal history of nicotine dependence: Secondary | ICD-10-CM | POA: Diagnosis not present

## 2019-05-03 DIAGNOSIS — I129 Hypertensive chronic kidney disease with stage 1 through stage 4 chronic kidney disease, or unspecified chronic kidney disease: Secondary | ICD-10-CM | POA: Diagnosis not present

## 2019-05-03 DIAGNOSIS — E1142 Type 2 diabetes mellitus with diabetic polyneuropathy: Secondary | ICD-10-CM | POA: Diagnosis not present

## 2019-05-03 DIAGNOSIS — E039 Hypothyroidism, unspecified: Secondary | ICD-10-CM | POA: Diagnosis not present

## 2019-05-03 DIAGNOSIS — Z9181 History of falling: Secondary | ICD-10-CM | POA: Diagnosis not present

## 2019-05-03 DIAGNOSIS — C9000 Multiple myeloma not having achieved remission: Secondary | ICD-10-CM | POA: Diagnosis not present

## 2019-05-03 DIAGNOSIS — Z79891 Long term (current) use of opiate analgesic: Secondary | ICD-10-CM | POA: Diagnosis not present

## 2019-05-03 DIAGNOSIS — E1122 Type 2 diabetes mellitus with diabetic chronic kidney disease: Secondary | ICD-10-CM | POA: Diagnosis not present

## 2019-05-09 ENCOUNTER — Telehealth: Payer: Self-pay

## 2019-05-14 ENCOUNTER — Ambulatory Visit: Payer: Self-pay | Admitting: Licensed Clinical Social Worker

## 2019-05-14 ENCOUNTER — Telehealth: Payer: Self-pay

## 2019-05-14 NOTE — Chronic Care Management (AMB) (Signed)
  Chronic Care Management    Clinical Social Work Follow Up Note  05/14/2019 Name: [name] MRN: [id number] DOB: [birth year]  [name] is a 78 y.o. year old male who is a primary care patient of Valerie Roys, DO. The CCM team was consulted for assistance with [employer] .   Review of patient status, including review of consultants reports, other relevant assessments, and collaboration with appropriate care team members and the patient's provider was performed as part of comprehensive patient evaluation and provision of chronic care management services.     LCSW completed CCM outreach attempt today but was unable to reach patient successfully. A HIPPA compliant voice message was left encouraging patient to return call once available. LCSW rescheduled CCM SW appointment as well.  Follow Up Plan: SW will follow up with patient by phone over the next month  Eula Fried, [place], MSW, [email or web address] Phone: [phone]

## 2019-05-18 ENCOUNTER — Ambulatory Visit: Payer: Self-pay | Admitting: *Deleted

## 2019-05-18 DIAGNOSIS — G629 Polyneuropathy, unspecified: Secondary | ICD-10-CM

## 2019-05-18 DIAGNOSIS — N3000 Acute cystitis without hematuria: Secondary | ICD-10-CM

## 2019-05-18 NOTE — Chronic Care Management (AMB) (Signed)
  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 year old male who is a primary care patient of Valerie Roys, DO. The CCM team was consulted for assistance with chronic disease management and care coordination needs.    Review of patient status, including review of consultants reports, relevant laboratory and other test results, and collaboration with appropriate care team members and the patient's provider was performed as part of comprehensive patient evaluation and provision of chronic care management services.    Goals Addressed            This Visit's Progress   . RNCM-My hands hurt and hospice is not coming anymore (pt-stated)       Current Barriers:  Marland Kitchen Knowledge Deficits related to chronic pain and care giving needs . Film/video editor.   Nurse Case Manager Clinical Goal(s):  Marland Kitchen Over the next 90 days, patient will work with [facility] to address needs related to Chronic pain and care giving needs.  Interventions:  . Reviewed medications with patient and discussed pain to his hands and the patient stating the gabapentin not helpful . Collaborated with [facility] Nurse with [facility] regarding patient's home situation . She states son does well caring for his dad managing his medications and providing meals. She stated the patient was evaluated by hospice SW and was deemed in financial need. She also stated patient was noncompliant with gabapentin.  . Discussed plans with patient for ongoing care management follow up and provided patient with direct contact information for care management team . Care Guide referral for for food pantry resources  . Social Work referral for financial evaluation related to patient's son stating he needed help buying cleaning  supplies.  Marland Kitchen Spoke to patient and son [name] Patient stated they could use some more groceries.  [name] stated patient was taking meds and had a good appetite.  [name] stated patient would benefit from ensure(chocolate) and patient stated he would really like to have meals on wheels if they would deliver that far out.  [name] stated patient does get tired easy and will sometimes not feel like bathing . Plan to reach out to C-3 team to get patient on [facility] waiting list   Patient Self Care Activities:  . Currently UNABLE TO independently manage his chronic pain/care needs  Initial goal documentation         The care management team will reach out to the patient again over the next 30 days.  The patient has been provided with contact information for the care management team and has been advised to call with any health related questions or concerns.    Merlene Morse D[place] Ament RN, BSN Nurse Case Editor, commissioning [facility] Care Management  [phone]) Business Mobile

## 2019-05-18 NOTE — Patient Instructions (Signed)
Thank you allowing the Chronic Care Management Team to be a part of your care! It was a pleasure speaking with you today!   CCM (Chronic Care Management) Team   Maryn Freelove RN, BSN Nurse Care Coordinator  [phone]  Catie [facility] PharmD  Clinical Pharmacist  [phone]  Eula Fried LCSW Clinical Social Worker [phone]  Goals Addressed            This Visit's Progress   . RNCM-My hands hurt and hospice is not coming anymore (pt-stated)       Current Barriers:  Marland Kitchen Knowledge Deficits related to chronic pain and care giving needs . Film/video editor.   Nurse Case Manager Clinical Goal(s):  Marland Kitchen Over the next 90 days, patient will work with [facility] to address needs related to Chronic pain and care giving needs.  Interventions:  . Reviewed medications with patient and discussed pain to his hands and the patient stating the gabapentin not helpful . Collaborated with [facility] Nurse with [facility] regarding patient's home situation . She states son does well caring for his dad managing his medications and providing meals. She stated the patient was evaluated by hospice SW and was deemed in financial need. She also stated patient was noncompliant with gabapentin.  . Discussed plans with patient for ongoing care management follow up and provided patient with direct contact information for care management team . Care Guide referral for for food pantry resources  . Social Work referral for financial evaluation related to patient's son stating he needed help buying cleaning supplies.  Marland Kitchen Spoke to patient and son [name]. Patient stated they could use some more groceries.  [name] stated patient was taking meds and had a good appetite.  [name] stated patient would benefit from ensure(chocolate) and patient stated he would really like to have meals on wheels if they would deliver that far out.  [name] stated patient does get tired easy and will sometimes not feel like  bathing . Plan to reach out to C-3 team to get patient on M[facility] waiting list   Patient Self Care Activities:  . Currently UNABLE TO independently manage his chronic pain/care needs  Initial goal documentation        The patient verbalized understanding of instructions provided today and declined a print copy of patient instruction materials.   The patient has been provided with contact information for the care management team and has been advised to call with any health related questions or concerns.

## 2019-06-13 ENCOUNTER — Telehealth: Payer: Self-pay

## 2019-06-15 ENCOUNTER — Ambulatory Visit: Payer: Self-pay | Admitting: Licensed Clinical Social Worker

## 2019-06-15 ENCOUNTER — Other Ambulatory Visit: Payer: Self-pay

## 2019-06-15 DIAGNOSIS — Z789 Other specified health status: Secondary | ICD-10-CM

## 2019-06-15 NOTE — Chronic Care Management (AMB) (Signed)
  Chronic Care Management    Clinical Social Work Follow Up Note  06/15/2019 Name: [name] MRN: [id number] DOB: 07-04-1941  [name] is a 78 y.o. year old male who is a primary care patient of Valerie Roys, DO. The CCM team was consulted for assistance with Food Insecurity and Financial Difficulties related to managing health care.   Review of patient status, including review of consultants reports, other relevant assessments, and collaboration with appropriate care team members and the patient's provider was performed as part of comprehensive patient evaluation and provision of chronic care management services.    SDOH (Social Determinants of Health) screening performed today: Financial Strain . See Care Plan for related entries.   Outpatient Encounter Medications as of 06/15/2019  Medication Sig  . acetaminophen (TYLENOL) 500 MG tablet Take 1,000 mg by mouth 2 (two) times a day.  Marland Kitchen amitriptyline (ELAVIL) 150 MG tablet Take 1 tablet (150 mg total) by mouth at bedtime. For hand pain  . levothyroxine (SYNTHROID) 25 MCG tablet Take 1 tablet (25 mcg total) by mouth daily before breakfast.  . Multiple Vitamin (MULTIVITAMIN) tablet Take 1 tablet by mouth daily.   Facility-Administered Encounter Medications as of 06/15/2019  Medication  . cyanocobalamin ((VITAMIN B-12)) injection 1,000 mcg     Goals Addressed    . "I need financial and food support resource education." (pt-stated)       Current Barriers:  . Financial constraints related to managing health care . Limited social support . Limited access to food . ADL IADL limitations  Clinical Social Work Clinical Goal(s):  Marland Kitchen Over the next 90 days, client will work with SW to address concerns related to gaining appropriate food and financial support education  Interventions: . Patient interviewed and appropriate assessments performed . Provided patient with information about available community resources within the area that may  be beneficial to patient/family . Discussed plans with patient for ongoing care management follow up and provided patient with direct contact information for care management team . Advised patient to check mailbox for community resources that LCSW successfully sent out through mail on 06/15/2019 . Assisted patient/caregiver with obtaining information about health plan benefits . Provided education to patient/caregiver regarding level of care options.  Patient Self Care Activities:  . Attends all scheduled provider appointments . Calls provider office for new concerns or questions  Initial goal documentation     Follow Up Plan: SW will follow up with patient by phone over the next 45 days  Eula Fried, [place], MSW, [email or web address] Phone: [phone]

## 2019-06-28 ENCOUNTER — Encounter: Payer: Self-pay | Admitting: Family Medicine

## 2019-06-28 ENCOUNTER — Ambulatory Visit (INDEPENDENT_AMBULATORY_CARE_PROVIDER_SITE_OTHER): Payer: Medicare Other | Admitting: Family Medicine

## 2019-06-28 ENCOUNTER — Other Ambulatory Visit: Payer: Self-pay

## 2019-06-28 VITALS — BP 137/66 | HR 75 | Temp 98.0°F

## 2019-06-28 DIAGNOSIS — G629 Polyneuropathy, unspecified: Secondary | ICD-10-CM | POA: Diagnosis not present

## 2019-06-28 DIAGNOSIS — E039 Hypothyroidism, unspecified: Secondary | ICD-10-CM | POA: Diagnosis not present

## 2019-06-28 MED ORDER — AMITRIPTYLINE HCL 100 MG PO TABS: 100.0000 mg | ORAL_TABLET | Freq: Every day | ORAL | 3 refills | Status: DC

## 2019-06-28 NOTE — Progress Notes (Signed)
BP 137/66   Pulse 75   Temp 98 F (36.7 C)   SpO2 99%    Subjective:    Patient ID: [name], male    DOB: Sep 27, 1941, 78 y.o.   MRN: [id number]  HPI: [name] is a 78 y.o. male  Chief Complaint  Patient presents with  . Hypothyroidism  . Peripheral Neuropathy   HYPOTHYROIDISM Thyroid control status:unknown Satisfied with current treatment? yes Medication side effects: no Medication compliance: fair compliance Recent dose adjustment:yes Fatigue: yes Cold intolerance: no Heat intolerance: no Weight gain: no Weight loss: yes Constipation: no Diarrhea/loose stools: no Palpitations: no Lower extremity edema: no Anxiety/depressed mood: no  Still having numbness in his hands. Denies any changes. His son notes that he got dizzy on the 150mg  and so he's been cutting it in half, he's been consistently taking the 75mg  of the amitriptyline. He has not been doing his exercises and is getting weaker again. No other concerns or complaints at this time.   Relevant past medical, surgical, family and social history reviewed and updated as indicated. Interim medical history since our last visit reviewed. Allergies and medications reviewed and updated.  Review of Systems  Constitutional: Negative.   Respiratory: Negative.   Cardiovascular: Negative.   Gastrointestinal: Negative.   Musculoskeletal: Negative.   Neurological: Negative.   Psychiatric/Behavioral: Negative.     Per HPI unless specifically indicated above     Objective:    BP 137/66   Pulse 75   Temp 98 F (36.7 C)   SpO2 99%   Wt Readings from Last 3 Encounters:  05/01/19 166 lb (75.3 kg)  04/17/19 170 lb (77.1 kg)  06/15/18 110 lb 3.7 oz (50 kg)    Physical Exam Vitals signs and nursing note reviewed.  Constitutional:      General: He is not in acute distress.    Appearance: Normal appearance. He is ill-appearing. He is not toxic-appearing or diaphoretic.  HENT:     Head: Normocephalic and  atraumatic.     Right Ear: External ear normal.     Left Ear: External ear normal.     Nose: Nose normal.     Mouth/Throat:     Mouth: Mucous membranes are moist.     Pharynx: Oropharynx is clear.  Eyes:     General: No scleral icterus.       Right eye: No discharge.        Left eye: No discharge.     Extraocular Movements: Extraocular movements intact.     Conjunctiva/sclera: Conjunctivae normal.     Pupils: Pupils are equal, round, and reactive to light.  Neck:     Musculoskeletal: Normal range of motion and neck supple.  Cardiovascular:     Rate and Rhythm: Normal rate and regular rhythm.     Pulses: Normal pulses.     Heart sounds: Normal heart sounds. No murmur. No friction rub. No gallop.   Pulmonary:     Effort: Pulmonary effort is normal. No respiratory distress.     Breath sounds: Normal breath sounds. No stridor. No wheezing, rhonchi or rales.  Chest:     Chest wall: No tenderness.  Musculoskeletal: Normal range of motion.  Skin:    General: Skin is warm and dry.     Capillary Refill: Capillary refill takes less than 2 seconds.     Coloration: Skin is not jaundiced or pale.     Findings: No bruising, erythema, lesion or rash.  Neurological:  General: No focal deficit present.     Mental Status: He is alert and oriented to person, place, and time. Mental status is at baseline.  Psychiatric:        Mood and Affect: Mood normal.        Behavior: Behavior normal.        Thought Content: Thought content normal.        Judgment: Judgment normal.     Results for orders placed or performed in visit on 05/01/19  UA/M w/rflx Culture, Routine   Specimen: Urine   URINE  Result Value Ref Range   Specific Gravity, UA 1.010 1.005 - 1.030   pH, UA 6.0 5.0 - 7.5   Color, UA Yellow Yellow   Appearance Ur Clear Clear   Leukocytes,UA Negative Negative   Protein,UA Negative Negative/Trace   Glucose, UA Negative Negative   Ketones, UA Negative Negative   RBC, UA Negative  Negative   Bilirubin, UA Negative Negative   Urobilinogen, Ur 1.0 0.2 - 1.0 mg/dL   Nitrite, UA Negative Negative      Assessment & Plan:   Problem List Items Addressed This Visit      Endocrine   Hypothyroidism - Primary    Rechecking levels today. Await results. Treat as needed. Call with any concerns.       Relevant Orders   TSH     Nervous and Auditory   Peripheral neuropathy    Not under good control. Will increase amitriptyline to 100mg  as he didn't tolerate the 150mg  and recheck 6 weeks. Call with any concerns.       Relevant Medications   amitriptyline (ELAVIL) 100 MG tablet       Follow up plan: Return in about 6 weeks (around 08/09/2019).

## 2019-06-28 NOTE — Assessment & Plan Note (Signed)
Rechecking levels today. Await results. Treat as needed. Call with any concerns.  

## 2019-06-28 NOTE — Assessment & Plan Note (Signed)
Not under good control. Will increase amitriptyline to 100mg  as he didn't tolerate the 150mg  and recheck 6 weeks. Call with any concerns.

## 2019-06-28 NOTE — Patient Instructions (Signed)
Stop taking the 75mg  of the amitriptyline- I've sent through a 100mg  amitriptyline to try to help with your numbness. We'll call you about your thyroid tomorrow.

## 2019-06-29 ENCOUNTER — Telehealth: Payer: Self-pay | Admitting: Family Medicine

## 2019-06-29 LAB — TSH: TSH: 2.97 u[IU]/mL (ref 0.450–4.500)

## 2019-06-29 MED ORDER — LEVOTHYROXINE SODIUM 25 MCG PO TABS: 25.0000 ug | ORAL_TABLET | Freq: Every day | ORAL | 3 refills | Status: DC

## 2019-06-29 NOTE — Telephone Encounter (Signed)
Please let his son know that his thyroid came back in the normal range, so I've sent a years supply of medicine to [facility] for him.

## 2019-06-29 NOTE — Telephone Encounter (Signed)
Spoke to patients son yesterday, patient is not set up with [employer].

## 2019-06-29 NOTE — Addendum Note (Signed)
Addended by: Valerie Roys on: 06/29/2019 05:11 PM   Modules accepted: Orders

## 2019-06-29 NOTE — Telephone Encounter (Signed)
meds sent to [facility]

## 2019-07-04 IMAGING — MR MR HEAD W/O CM
10 series · 47 of 48 positions shown · non-contrast
Comparison: None.

CLINICAL DATA: Unexplained altered level of consciousness

EXAM:
MRI HEAD WITHOUT CONTRAST
TECHNIQUE: Multiplanar, multiecho pulse sequences of the brain and surrounding
structures were obtained without intravenous contrast.

[Series 3: GRE · sagittal · 5.0mm · 0.45mm/px · 3 of 21 slices shown (1 of 2)]
[im 1/21]
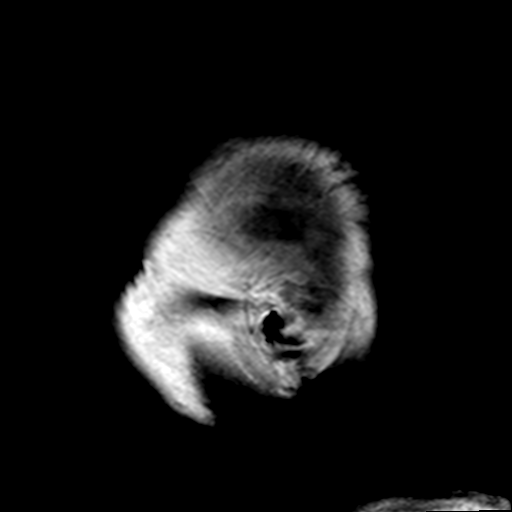
[im 11/21]
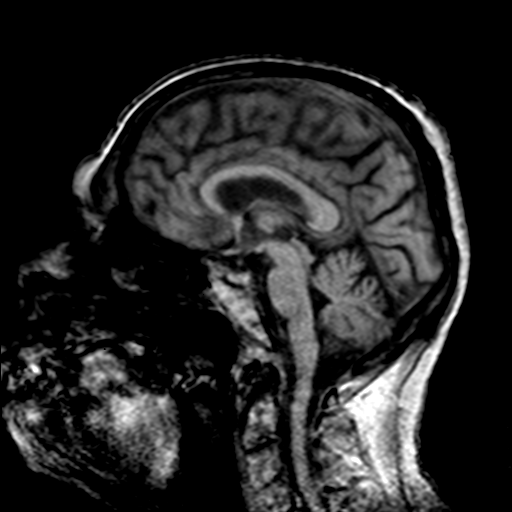
[im 21/21]
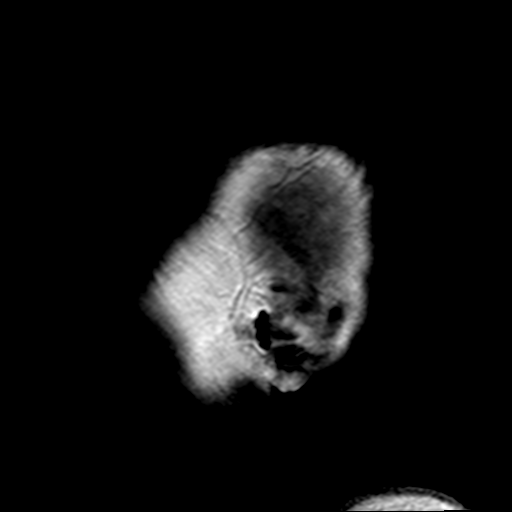

[Series 5: DWI · axial · 3.0mm · 1.80mm/px · z∈[-97,+47]mm · 7 of 50 slices shown (1 of 2)]
[im 1/50]
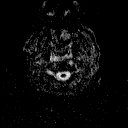
[im 9/50]
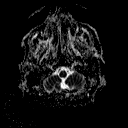
[im 17/50]
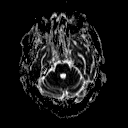
[im 25/50]
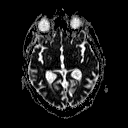
[im 33/50]
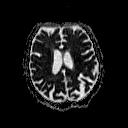
[im 41/50]
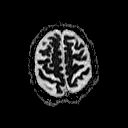
[im 50/50]
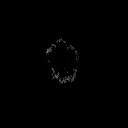

[Series 7: DWI · coronal · 3.0mm · 1.80mm/px · 6 of 41 slices shown (2 of 2)]
[im 1/41]
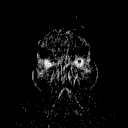
[im 9/41]
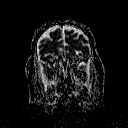
[im 17/41]
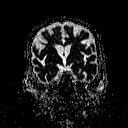
[im 25/41]
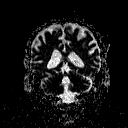
[im 33/41]
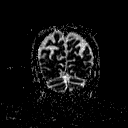
[im 41/41]
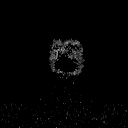

[Series 8: T2 · axial · 5.0mm · 0.45mm/px · z∈[-94,+47]mm · 3 of 23 slices shown (1 of 3)]
[im 1/23]
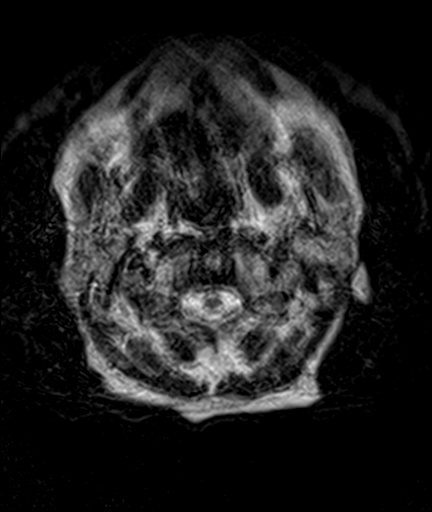
[im 12/23]
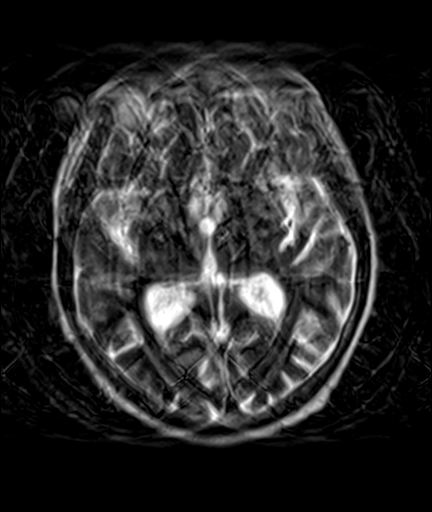
[im 23/23]
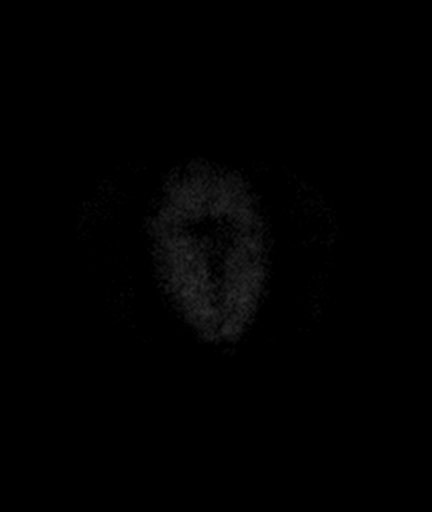

[Series 9: FLAIR · axial · 3.0mm · 0.45mm/px · z∈[-94,+48]mm · 7 of 49 slices shown]
[im 1/49]
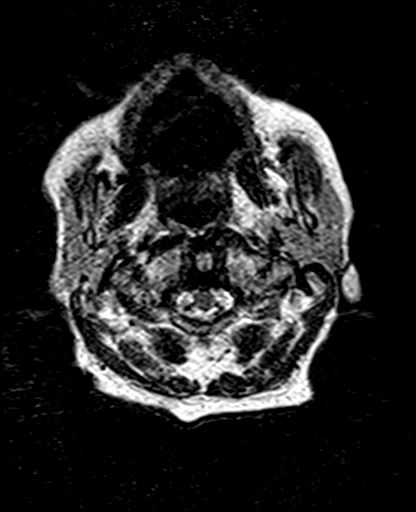
[im 9/49]
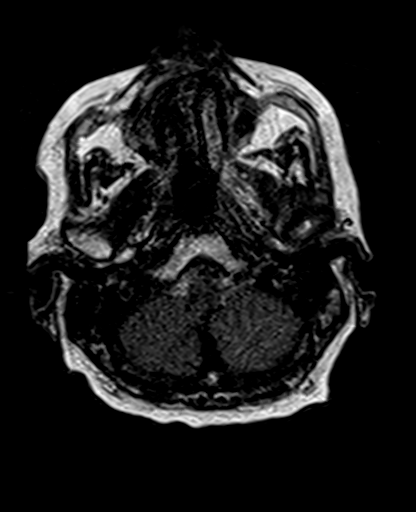
[im 17/49]
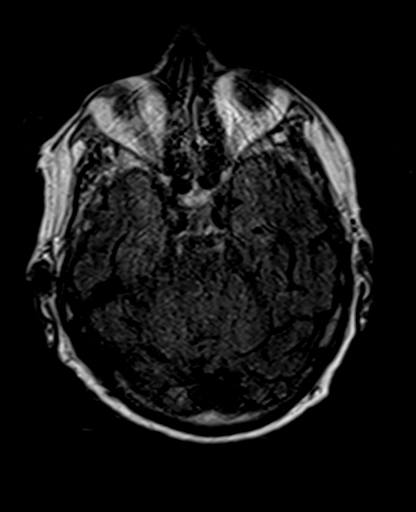
[im 25/49]
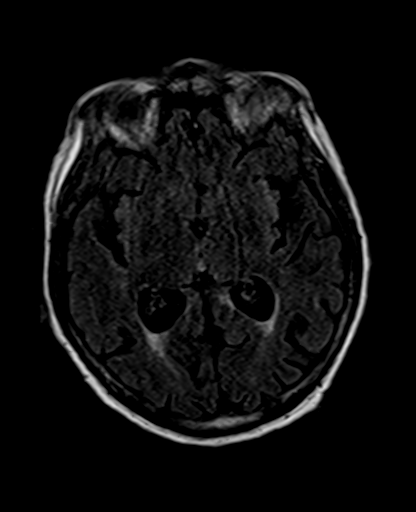
[im 33/49]
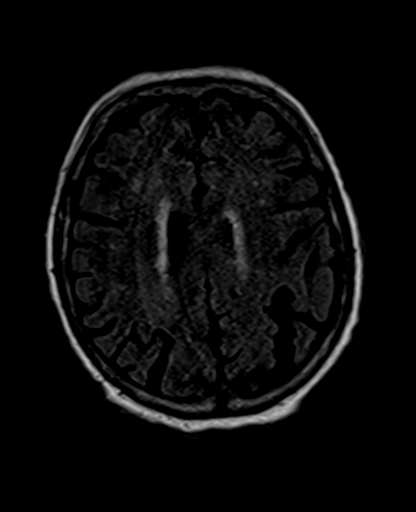
[im 41/49]
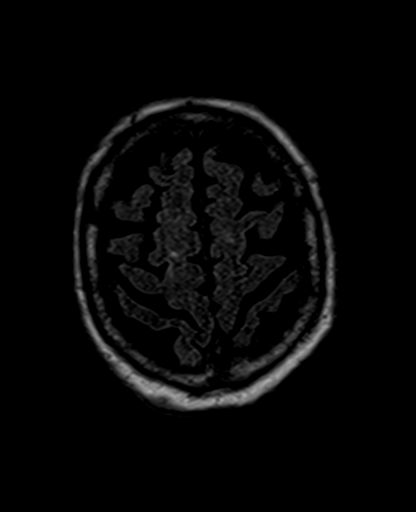
[im 49/49]
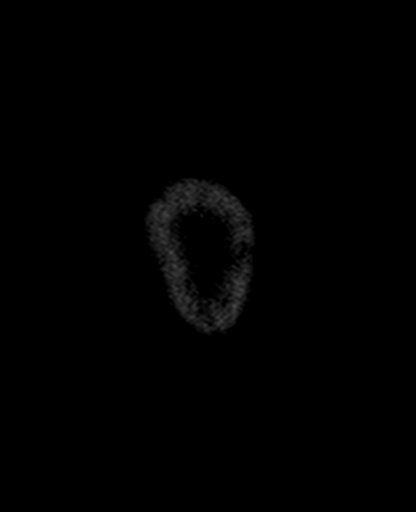

[Series 10: T2 · axial · 5.0mm · 1.20mm/px · z∈[-95,+46]mm · 3 of 23 slices shown (2 of 3)]
[im 1/23]
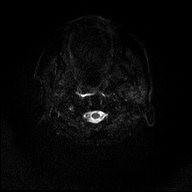
[im 12/23]
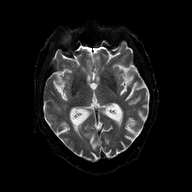
[im 23/23]
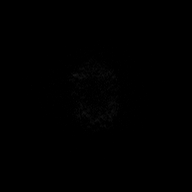

[Series 11: GRE · axial · 5.0mm · 0.45mm/px · z∈[-95,+46]mm · 3 of 23 slices shown (2 of 2)]
[im 1/23]
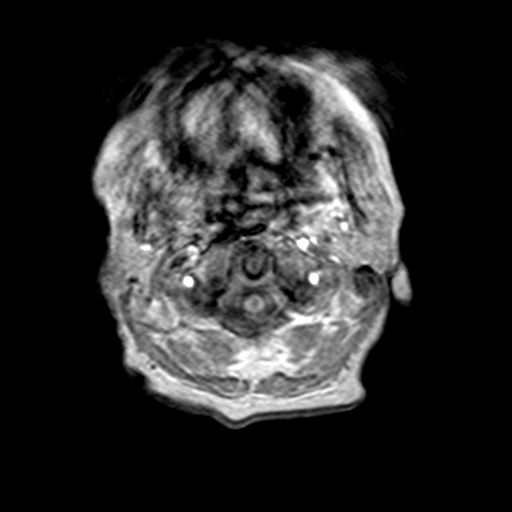
[im 12/23]
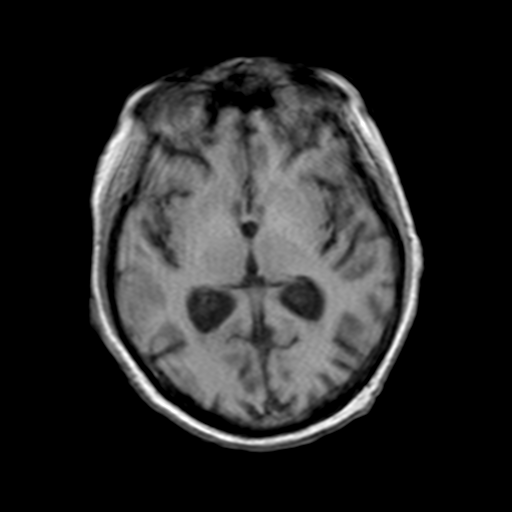
[im 23/23]
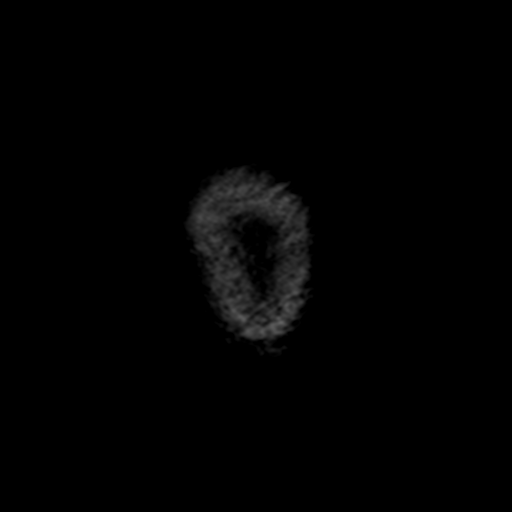

[Series 12: T2 · coronal · 5.0mm · 0.43mm/px · 3 of 23 slices shown (3 of 3)]
[im 1/23]
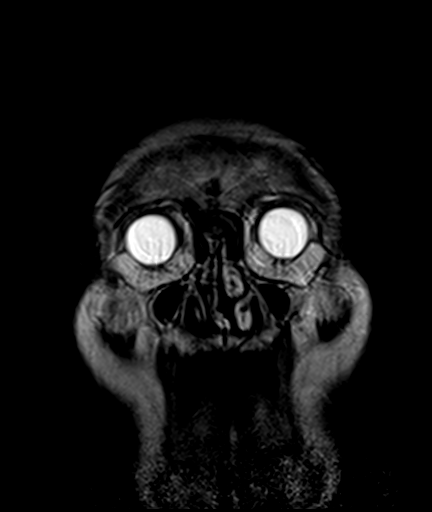
[im 12/23]
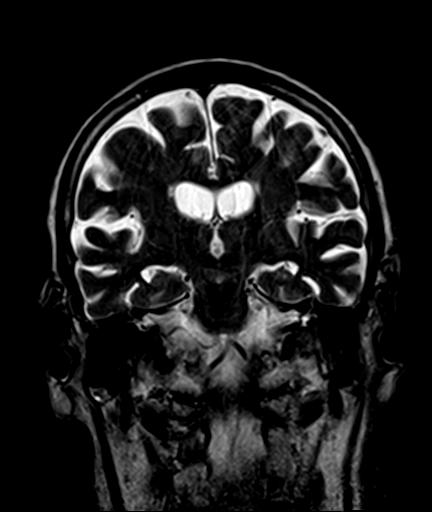
[im 23/23]
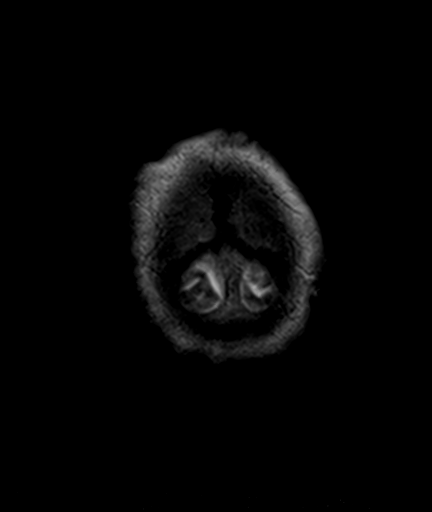

[Series 100: (id) ax · axial · 3.0mm · 1.80mm/px · z∈[-97,+47]mm · 7 of 50 slices shown]
[im 1/50]
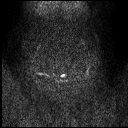
[im 9/50]
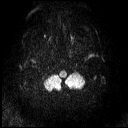
[im 17/50]
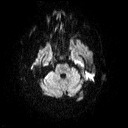
[im 25/50]
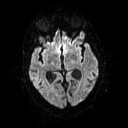
[im 33/50]
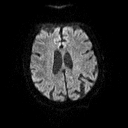
[im 41/50]
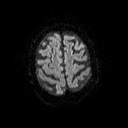
[im 50/50]
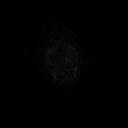

[Series 101: (id) cor · coronal · 3.0mm · 1.80mm/px · 5 of 41 slices shown]
[im 1/41]
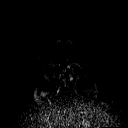
[im 9/41]
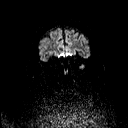
[im 17/41]
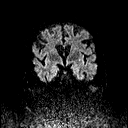
[im 25/41]
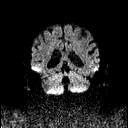
[im 33/41]
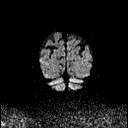

[47 of 48 positions shown; findings below may reference images not displayed]

FINDINGS: Moderately motion degraded study, requiring fast brain protocol.

Diffusion imaging is diagnostic and negative for acute infarct.
There is generalized cerebral volume loss that is mild for age. Mild
chronic small vessel ischemic change in the cerebral white matter.
Small remote right parietal cortex infarct. No hemorrhage,
hydrocephalus, or collection.

Negative orbits and sinuses.  Grossly preserved major flow voids.
IMPRESSION: 1. Senescent changes without acute finding.
2. Moderately motion degraded.

## 2019-07-08 IMAGING — US US ABDOMEN COMPLETE
1 series · 13 of 25 positions shown · non-contrast
Comparison: Ultrasound February 03, 2017.

CLINICAL DATA: Abnormal liver function tests.

EXAM:
ABDOMEN ULTRASOUND COMPLETE

[Series 1: us abdomen complete · 13 of 137 slices shown]
[im 1/137]
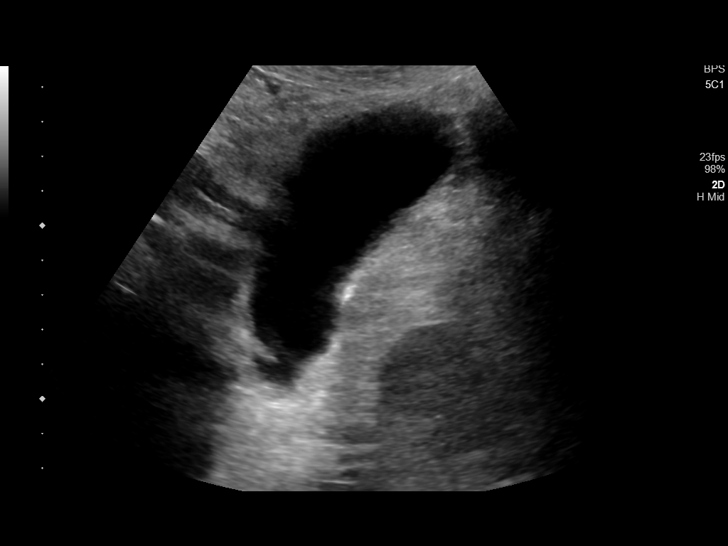
[im 12/137]
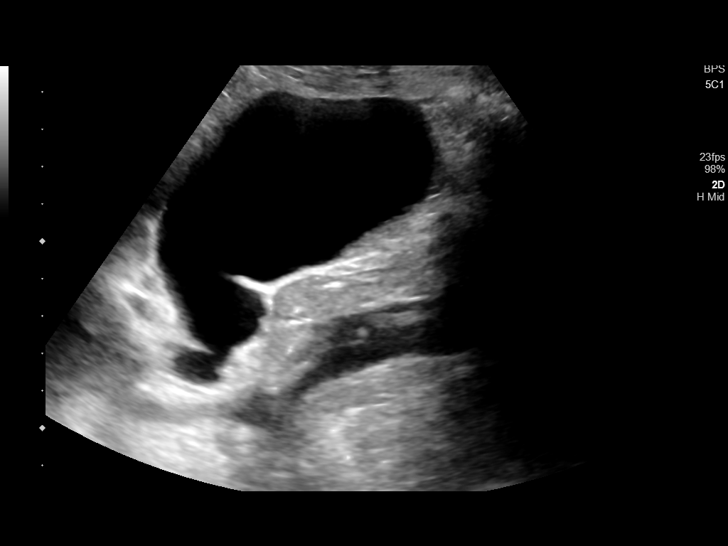
[im 23/137]
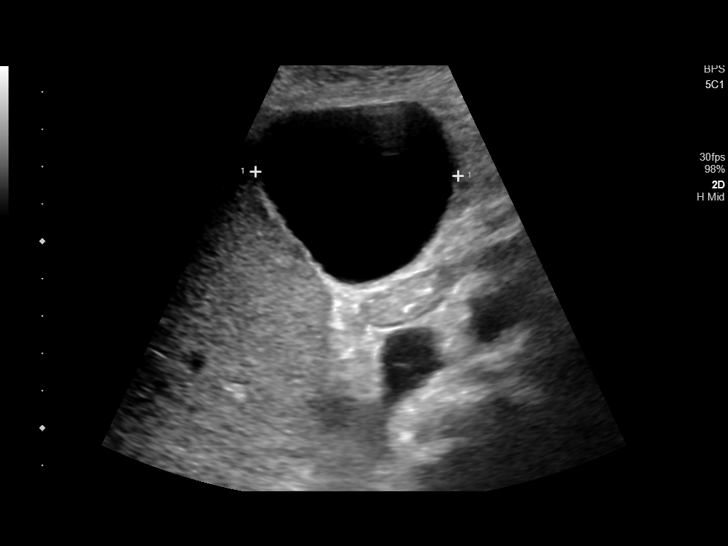
[im 35/137]
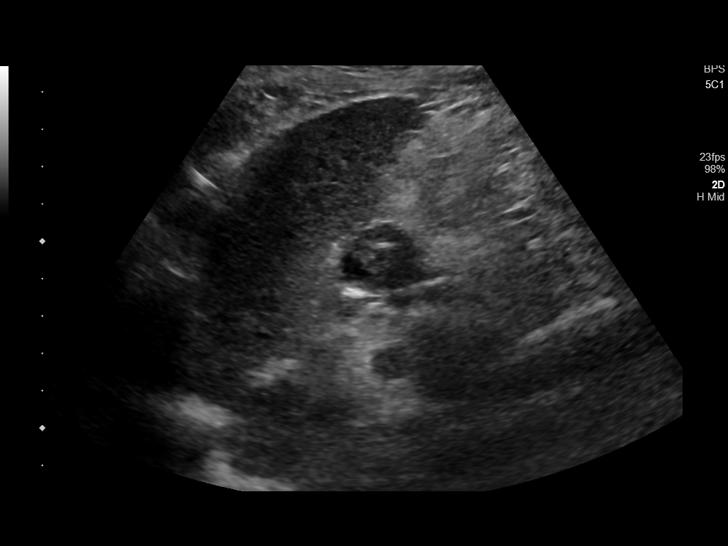
[im 46/137]
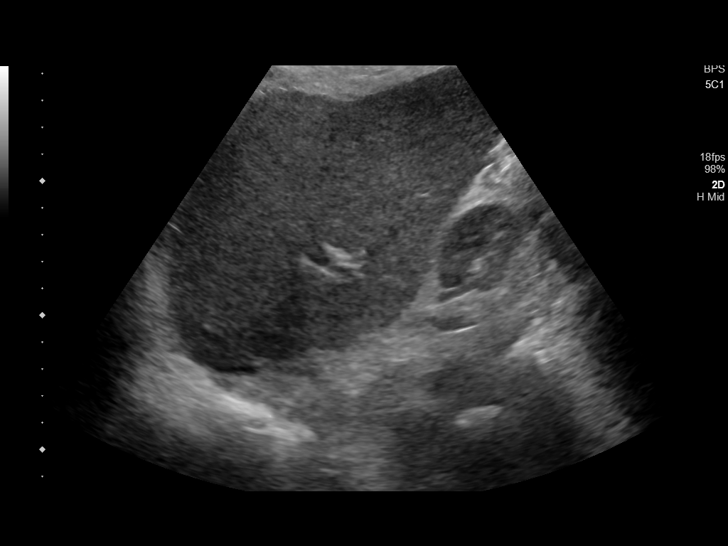
[im 57/137]
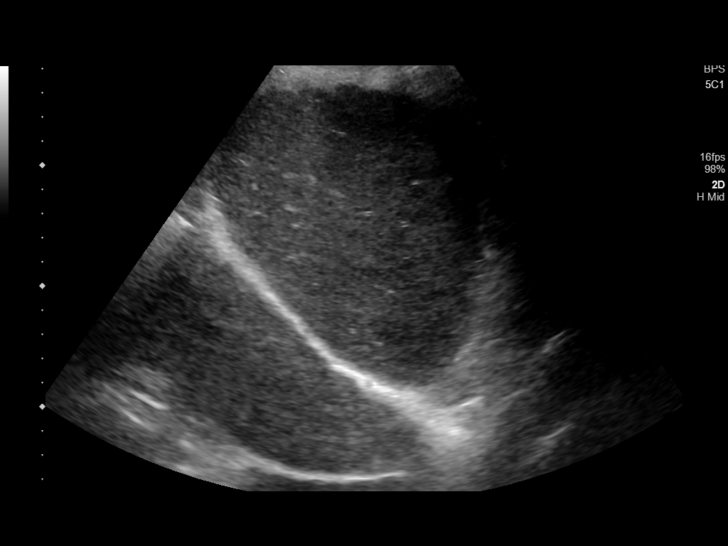
[im 69/137]
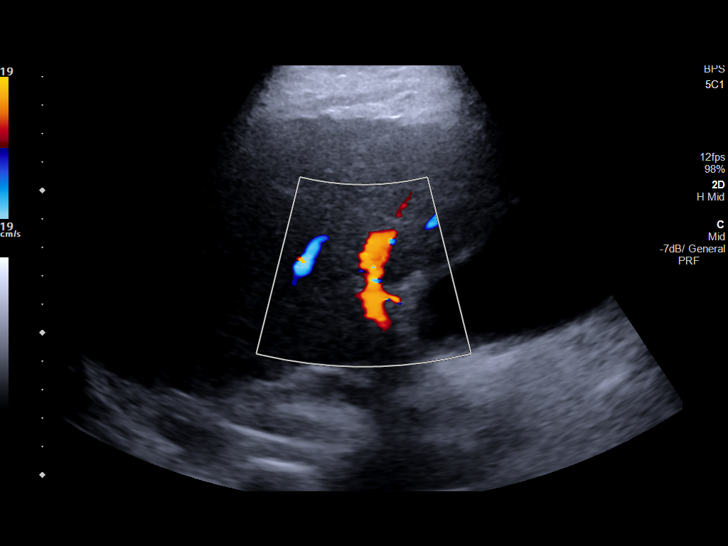
[im 80/137]
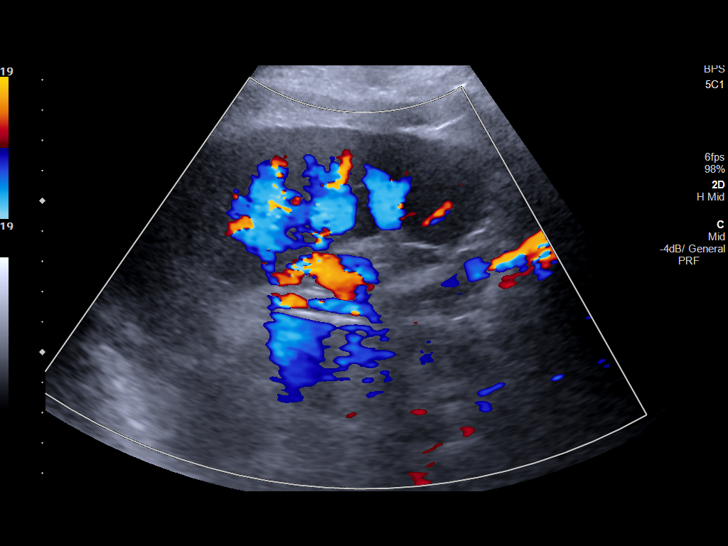
[im 91/137]
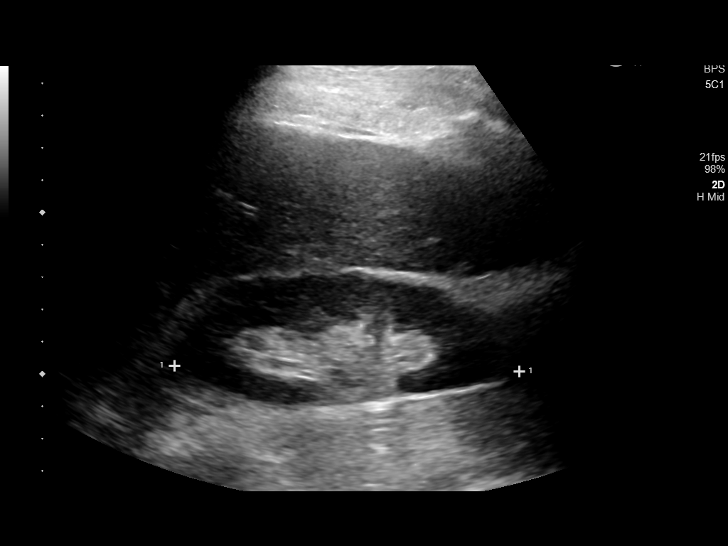
[im 103/137]
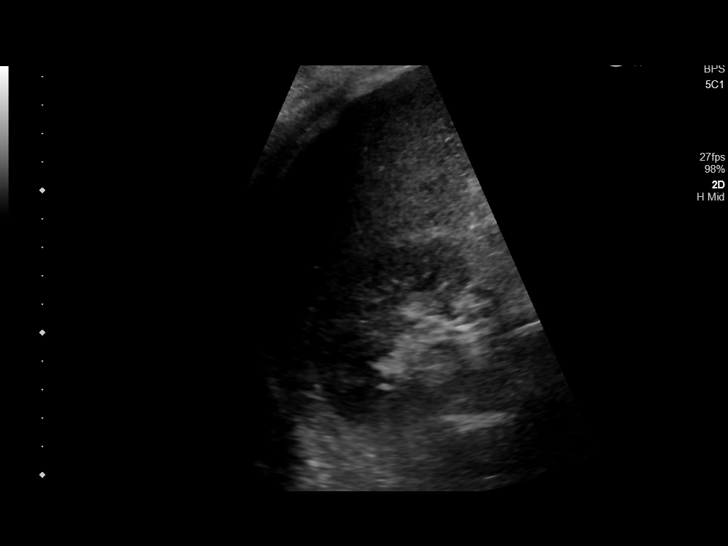
[im 114/137]
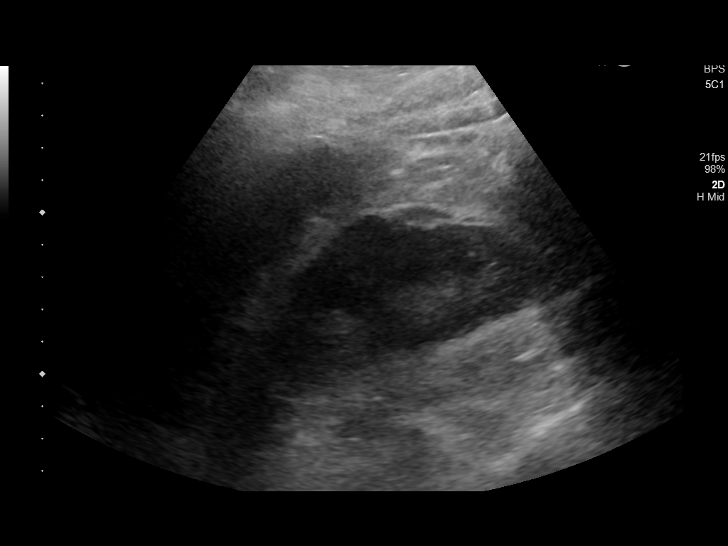
[im 125/137]
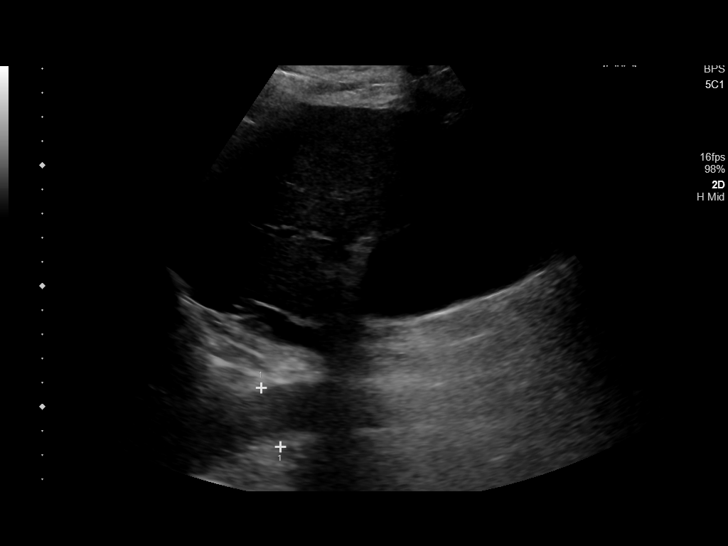
[im 137/137]
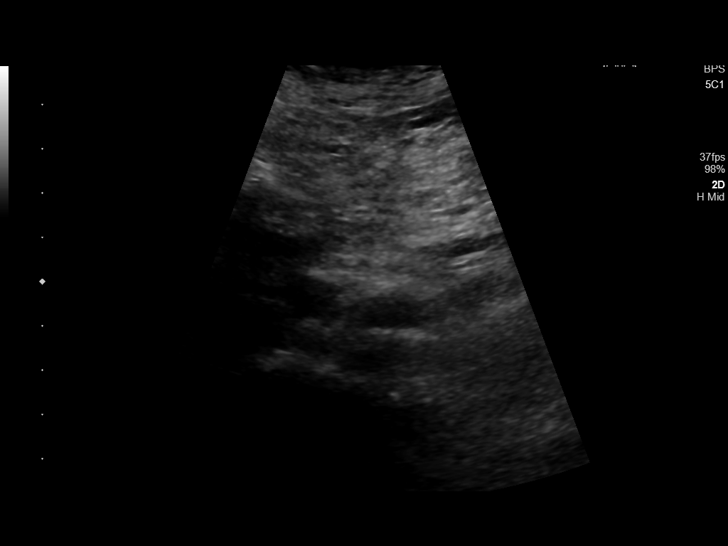

[13 of 25 positions shown; findings below may reference images not displayed]

FINDINGS: Gallbladder: No gallstones or wall thickening visualized. No
sonographic Murphy sign noted by sonographer. Mild amount of sludge
is noted in gallbladder lumen.

Common bile duct: Diameter: 6 mm which is within normal limits.

Liver: No focal lesion identified. Increased echogenicity of hepatic
parenchyma is noted. Portal vein is patent on color Doppler imaging
with normal direction of blood flow towards the liver.

IVC: No abnormality visualized.

Pancreas: Visualized portion unremarkable.

Spleen: Size and appearance within normal limits.

Right Kidney: Length: 10.7 cm. 1.4 cm simple cyst is seen in upper
pole. Echogenicity within normal limits. No mass or hydronephrosis
visualized.

Left Kidney: Length: 9.9 cm. Echogenicity within normal limits. No
mass or hydronephrosis visualized.

Abdominal aorta: No aneurysm visualized.

Other findings: None.
IMPRESSION: Increased echogenicity of hepatic parenchyma is noted suggesting
fatty infiltration. Small amount of gallbladder sludge is noted.
Small right renal cyst is noted. No other abnormality seen in the
abdomen.

## 2019-07-16 ENCOUNTER — Telehealth: Payer: Self-pay

## 2019-07-17 ENCOUNTER — Ambulatory Visit: Payer: Self-pay | Admitting: Licensed Clinical Social Worker

## 2019-07-17 DIAGNOSIS — G629 Polyneuropathy, unspecified: Secondary | ICD-10-CM

## 2019-07-17 DIAGNOSIS — Z789 Other specified health status: Secondary | ICD-10-CM

## 2019-07-17 NOTE — Chronic Care Management (AMB) (Signed)
  Chronic Care Management    Clinical Social Work Follow Up Note  07/17/2019 Name: [name] MRN: [id number] DOB: 02-07-1941  [name] is a 78 y.o. year old male who is a primary care patient of Valerie Roys, DO. The CCM team was consulted for assistance with Food Insecurity.   Review of patient status, including review of consultants reports, other relevant assessments, and collaboration with appropriate care team members and the patient's provider was performed as part of comprehensive patient evaluation and provision of chronic care management services.    SDOH (Social Determinants of Health) screening performed today: Armed forces logistics/support/administrative officer . See Care Plan for related entries.   Outpatient Encounter Medications as of 07/17/2019  Medication Sig  . acetaminophen (TYLENOL) 500 MG tablet Take 1,000 mg by mouth 2 (two) times a day.  Marland Kitchen amitriptyline (ELAVIL) 100 MG tablet Take 1 tablet (100 mg total) by mouth at bedtime.  Marland Kitchen levothyroxine (SYNTHROID) 25 MCG tablet Take 1 tablet (25 mcg total) by mouth daily before breakfast.   Facility-Administered Encounter Medications as of 07/17/2019  Medication  . cyanocobalamin ((VITAMIN B-12)) injection 1,000 mcg     Goals Addressed    . "I need financial and food support resource education." (pt-stated)       Current Barriers:  . Financial constraints related to managing health care . Limited social support . Limited access to food . ADL IADL limitations  Clinical Social Work Clinical Goal(s):  Marland Kitchen Over the next 90 days, client will work with SW to address concerns related to gaining appropriate food and financial support education  Interventions: . Patient interviewed and appropriate assessments performed . Provided patient with information about available community resources within the area that may be beneficial to patient/family . Discussed plans with patient for ongoing care management follow up and provided patient  with direct contact information for care management team . Advised patient to check mailbox for community resources that LCSW successfully sent out through mail on 06/15/2019. Resource successfully received per family.  . Family reports that he utilizes local food pantries at churches for food assistance. . Patient's family confirms no urgent needs at this time and confirms food in the house. . Assisted patient/caregiver with obtaining information about health plan benefits . Provided education to patient/caregiver regarding level of care options.  Patient Self Care Activities:  . Attends all scheduled provider appointments . Calls provider office for new concerns or questions  Please see past updates related to this goal by clicking on the "Past Updates" button in the selected goal      Follow Up Plan: Client will contact CCM LCSW if social work needs arises in the future  Eula Fried, [place], [email or web address], [email or web address] Phone: [phone]

## 2019-07-18 ENCOUNTER — Telehealth: Payer: Self-pay

## 2019-07-25 ENCOUNTER — Ambulatory Visit (INDEPENDENT_AMBULATORY_CARE_PROVIDER_SITE_OTHER): Payer: Medicare Other

## 2019-07-25 DIAGNOSIS — Z0000 Encounter for general adult medical examination without abnormal findings: Secondary | ICD-10-CM

## 2019-07-25 NOTE — Progress Notes (Signed)
Subjective:   [name] is a 78 y.o. male who presents for Medicare Annual/Subsequent preventive examination.  This visit is being conducted via phone call  - after an attmept to do on video chat - due to the COVID-19 pandemic. This patient has given me verbal consent via phone to conduct this visit, patient states they are participating from their home address. Some vital signs may be absent or patient reported.   Patient identification: identified by name, DOB, and current address.    Son [name] and family members helped with visit today.   Review of Systems:   Cardiac Risk Factors include: advanced age (>16men, >48 women);hypertension;diabetes mellitus;dyslipidemia;smoking/ tobacco exposure     Objective:    Vitals: There were no vitals taken for this visit.  There is no height or weight on file to calculate BMI.  Advanced Directives 06/15/2018 06/15/2018 06/15/2018 06/03/2018 06/03/2018 05/18/2018 04/05/2018  Does Patient Have a Medical Advance Directive? No Yes No No No No No  Would patient like information on creating a medical advance directive? No - Patient declined - No - Patient declined No - Patient declined No - Patient declined No - Patient declined No - Patient declined  Some encounter information is confidential and restricted. Go to Review Flowsheets activity to see all data.    Tobacco Social History   Tobacco Use  Smoking Status Former Smoker  . Packs/day: 0.25  . Types: Cigarettes  . Quit date: 03/23/2015  . Years since quitting: 4.3  Smokeless Tobacco Current User  . Types: Chew     Ready to quit: Not Answered Counseling given: Not Answered   Clinical Intake:  Pre-visit preparation completed: Yes  Pain : No/denies pain     Nutritional Risks: None           Past Medical History:  Diagnosis Date  . Arthritis   . 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 Diabetes mellitus   . Dyspnea   . Goals of care, counseling/discussion 09/08/2017  .  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 Hypertension   . Hypothyroidism   . Neuropathy   . Peripheral vascular disease ([place])   . 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 Venous stasis    Past Surgical History:  Procedure Laterality Date  . CATARACT EXTRACTION W/PHACO Right 10/25/2016   Procedure: CATARACT EXTRACTION PHACO AND INTRAOCULAR LENS PLACEMENT (IOC)  right eye complicated;  Surgeon: Ronnell Freshwater, MD;  Location: [facility];  Service: Ophthalmology;  Laterality: Right;  Right eye Diabetic - oral meds Vision blue  . FINGER SURGERY     Family History  Problem Relation Age of Onset  . Heart disease Mother   . Alzheimer's disease Sister   . Heart disease Maternal Aunt    Social History   Socioeconomic History  . Marital status: Married    Spouse name: unknown  . Number of children: 2  . Years of education: Not on file  . Highest education level: Not on file  Occupational History  . Occupation: Custodian  Social Needs  . Financial resource strain: Somewhat hard  . Food insecurity    Worry: Never true    Inability: Never true  . Transportation needs    Medical: No    Non-medical: No  Tobacco Use  . Smoking status: Former Smoker    Packs/day: 0.25    Types: Cigarettes    Quit  date: 03/23/2015    Years since quitting: 4.3  . Smokeless tobacco: Current User    Types: Chew  Substance and Sexual Activity  . Alcohol use: Not Currently    Alcohol/week: 42.0 standard drinks    Types: 42 Cans of beer per week    Comment: not currently  . Drug use: No  . Sexual activity: Not Currently  Lifestyle  . Physical activity    Days per week: 0 days    Minutes per session: 0 min  . Stress: Not at all  Relationships  . Social Herbalist on phone: Patient refused    Gets together: Patient refused    Attends religious service: Patient refused    Active member of club or organization:  Patient refused    Attends meetings of clubs or organizations: Patient refused    Relationship status: Patient refused  Other Topics Concern  . Not on file  Social History Narrative   Lives at home with son    Outpatient Encounter Medications as of 07/25/2019  Medication Sig  . acetaminophen (TYLENOL) 500 MG tablet Take 1,000 mg by mouth 2 (two) times a day.  Marland Kitchen amitriptyline (ELAVIL) 100 MG tablet Take 1 tablet (100 mg total) by mouth at bedtime.  Marland Kitchen levothyroxine (SYNTHROID) 25 MCG tablet Take 1 tablet (25 mcg total) by mouth daily before breakfast.   Facility-Administered Encounter Medications as of 07/25/2019  Medication  . cyanocobalamin ((VITAMIN B-12)) injection 1,000 mcg    Activities of Daily Living In your present state of health, do you have any difficulty performing the following activities: 07/25/2019  Hearing? N  Comment no hearing aids  Vision? N  Comment eyeglasses  Difficulty concentrating or making decisions? N  Walking or climbing stairs? Y  Dressing or bathing? Y  Comment family helps  Doing errands, shopping? Y  Comment family transports  Conservation officer, nature and eating ? Y  Comment not eating solids due to no dentures  Using the Toilet? Y  Comment family has him wear depends all the time due to unable to get up to go to bathroom  In the past six months, have you accidently leaked urine? Y  Comment wears depends  Do you have problems with loss of bowel control? Y  Comment wears depends  Managing your Medications? Y  Comment son does  Managing your Finances? Y  Comment son does  Runner, broadcasting/film/video? Y  Comment son does  Some recent data might be hidden    Patient Care Team: Valerie Roys, DO as PCP - General (Family Medicine) Minor, Dalbert Garnet, RN as Case Manager De Hollings[place], [facility] as Pharmacist (Pharmacist) Greg Cutter, LCSW as Social Worker (Licensed Clinical Social Worker)   Assessment:   This is a routine wellness  examination for [name].  Exercise Activities and Dietary recommendations Current Exercise Habits: The patient does not participate in regular exercise at present, Exercise limited by: None identified  Goals    . "I need financial and food support resource education." (pt-stated)     Current Barriers:  . Financial constraints related to managing health care . Limited social support . Limited access to food . ADL IADL limitations  Clinical Social Work Clinical Goal(s):  Marland Kitchen Over the next 90 days, client will work with SW to address concerns related to gaining appropriate food and financial support education  Interventions: . Patient interviewed and appropriate assessments performed . Provided patient with information about available community resources  within the area that may be beneficial to patient/family . Discussed plans with patient for ongoing care management follow up and provided patient with direct contact information for care management team . Advised patient to check mailbox for community resources that LCSW successfully sent out through mail on 06/15/2019. Resource successfully received per family.  . Family reports that he utilizes local food pantries at churches for food assistance. . Patient's family confirms no urgent needs at this time and confirms food in the house. . Assisted patient/caregiver with obtaining information about health plan benefits . Provided education to patient/caregiver regarding level of care options.  Patient Self Care Activities:  . Attends all scheduled provider appointments . Calls provider office for new concerns or questions  Please see past updates related to this goal by clicking on the "Past Updates" button in the selected goal      . "I'm in pain and have little appetite" (pt-stated)     Current Barriers: . Patient recently discharged from Hospice services d/t being stable. Discharged from hospitalization in 06/2018 to Hospice for failure to  thrive. Meeting with primary care provider today to re-establish care . Son [name]) helps manage medications; per chart review, there has been a history of confusion about medication dosing. There are notes that the patient is illiterate.  . Current medications include acetaminophen 1000 mg BID, morphine 5 mg 1-2 times daily, and a multivitamin o Patient's greatest complaint is neuropathy in his hands; previously noted to likely be related to hx chronic alcoholism. Son notes that they stopped gabapentin because it made the son too sleepy. Does not appear that he has tried Lyrica. Has tried amitriptyline before  o Chart review notes that mirtazapine 15 mg was held d/t possibly causing dizziness   Pharmacist Clinical Goal(s):  Marland Kitchen Over the next 30 days, patient will work with PharmD and primary care provider to address needs related to medication management  Interventions: . Comprehensive medication review performed. . Will collaborate with Dr. Wynetta Emery on appropriate medication management for patient at this palliative stage. Will plan to support the son with adherence/management of medications. Dr. Wynetta Emery restarted amitriptyline 75 mg daily - will follow.  Patient Self Care Activities:  . Currently UNABLE TO independently independently manage medications  Initial goal documentation     . Have 3 meals a day    . RNCM-My hands hurt and hospice is not coming anymore (pt-stated)     Current Barriers:  Marland Kitchen Knowledge Deficits related to chronic pain and care giving needs . Film/video editor.   Nurse Case Manager Clinical Goal(s):  Marland Kitchen Over the next 90 days, patient will work with [facility] to address needs related to Chronic pain and care giving needs.  Interventions:  . Reviewed medications with patient and discussed pain to his hands and the patient stating the gabapentin not helpful . Collaborated with [facility] Nurse with [facility] regarding patient's home situation . She states son  does well caring for his dad managing his medications and providing meals. She stated the patient was evaluated by hospice SW and was deemed in financial need. She also stated patient was noncompliant with gabapentin.  . Discussed plans with patient for ongoing care management follow up and provided patient with direct contact information for care management team . Care Guide referral for for food pantry resources  . Social Work referral for financial evaluation related to patient's son stating he needed help buying cleaning supplies.  Marland Kitchen Spoke to patient and son [name] Patient  stated they could use some more groceries.  [name] stated patient was taking meds and had a good appetite.  [name] stated patient would benefit from ensure(chocolate) and patient stated he would really like to have meals on wheels if they would deliver that far out.  [name] stated patient does get tired easy and will sometimes not feel like bathing . Plan to reach out to C-3 team to get patient on [facility] waiting list   Patient Self Care Activities:  . Currently UNABLE TO independently manage his chronic pain/care needs  Initial goal documentation        Fall Risk: Fall Risk  07/25/2019 04/17/2019 08/16/2017 01/28/2017 09/21/2016  Falls in the past year? 0 0 No No No  Number falls in past yr: 0 0 - - -  Injury with Fall? 0 0 - - -  Risk for fall due to : - History of fall(s);Impaired balance/gait;Medication side effect - - -    FALL RISK PREVENTION PERTAINING TO THE HOME:  Any stairs in or around the home? Yes  If so, are there any without handrails? No   Home free of loose throw rugs in walkways, pet beds, electrical cords, etc? Yes  Adequate lighting in your home to reduce risk of falls? Yes   ASSISTIVE DEVICES UTILIZED TO PREVENT FALLS:  Life alert? No  Use of a cane, walker or w/c? No  Grab bars in the bathroom? Has but not installed  Shower chair or bench in shower? No  Elevated toilet seat or a  handicapped toilet? Yes   TIMED UP AND GO:  Unable to perform   Depression Screen PHQ 2/9 Scores 07/25/2019 08/16/2017 01/28/2017 09/21/2016  PHQ - 2 Score 0 0 0 0    Cognitive Function     6CIT Screen 07/15/2017 09/21/2016  What Year? 4 points 4 points  What month? 3 points 0 points  What time? 0 points 0 points  Count back from 20 2 points (No Data)  Months in reverse 4 points (No Data)  Repeat phrase 10 points 6 points  Total Score 23 -    Immunization History  Administered Date(s) Administered  . Pneumococcal Polysaccharide-23 01/15/2010  . Td 03/15/2005, 09/25/2015    Qualifies for Shingles Vaccine? Yes  Zostavax completed n/a. Due for Shingrix. Education has been provided regarding the importance of this vaccine. Pt has been advised to call insurance company to determine out of pocket expense. Advised may also receive vaccine at local pharmacy or Health Dept. Verbalized acceptance and understanding.   Tdap: up to date  Flu Vaccine: declined  Pneumococcal Vaccine: declined   Screening Tests Health Maintenance  Topic Date Due  . OPHTHALMOLOGY EXAM  11/24/2017  . FOOT EXAM  07/01/2018  . INFLUENZA VACCINE  01/09/2020 (Originally 05/12/2019)  . PNA vac Low Risk Adult (2 of 2 - PCV13) 04/16/2020 (Originally 01/16/2011)  . HEMOGLOBIN A1C  10/18/2019  . URINE MICROALBUMIN  04/16/2020  . TETANUS/TDAP  09/24/2025   Cancer Screenings:  Colorectal Screening: no longer required   Lung Cancer Screening: (Low Dose CT Chest recommended if Age 11-80 years, 30 pack-year currently smoking OR have quit w/in 15years.) does not qualify.   Additional Screening:  Hepatitis C Screening: does not qualify  Vision Screening: Recommended annual ophthalmology exams for early detection of glaucoma and other disorders of the eye. Is the patient up to date with their annual eye exam?  No    Dental Screening: Recommended annual dental exams for  proper oral hygiene  Community Resource  Referral:  CRR required this visit?  , patient needing dentures. Son states he is not ale to eat much and is requesting assistance getting some dentures.      Plan:  I have personally reviewed and addressed the Medicare Annual Wellness questionnaire and have noted the following in the patient's chart:  A. Medical and social history B. Use of alcohol, tobacco or illicit drugs  C. Current medications and supplements D. Functional ability and status E.  Nutritional status F.  Physical activity G. Advance directives H. List of other physicians I.  Hospitalizations, surgeries, and ER visits in previous 12 months J.  [place] such as hearing and vision if needed, cognitive and depression L. Referrals and appointments   In addition, I have reviewed and discussed with patient certain preventive protocols, quality metrics, and best practice recommendations. A written personalized care plan for preventive services as well as general preventive health recommendations were provided to patient.   Signed,   Bevelyn Ngo, LPN  [id number] Nurse Health Advisor   Nurse Notes: none

## 2019-07-25 NOTE — Patient Instructions (Signed)
[name] , Thank you for taking time to come for your Medicare Wellness Visit. I appreciate your ongoing commitment to your health goals. Please review the following plan we discussed and let me know if I can assist you in the future.   Screening recommendations/referrals: Colonoscopy: no longer required  Recommended yearly ophthalmology/optometry visit for glaucoma screening and checkup Recommended yearly dental visit for hygiene and checkup  Vaccinations: Influenza vaccine: declined Pneumococcal vaccine: declined Tdap vaccine: up to date  Shingles vaccine: shingrix eligible     Advanced directives: .please pick up copy of this information next time you are in the office  Conditions/risks identified: someone will contact you to see about getting assistance with dentures, we will work on nutrition until then with ensure samples and coupons as well. These will be at the office for you to pick up at your next office visit   Next appointment: Follow up in one year for your annual wellness visit   Preventive Care 65 Years and Older, Male Preventive care refers to lifestyle choices and visits with your health care provider that can promote health and wellness. What does preventive care include?  A yearly physical exam. This is also called an annual well check.  Dental exams once or twice a year.  Routine eye exams. Ask your health care provider how often you should have your eyes checked.  Personal lifestyle choices, including:  Daily care of your teeth and gums.  Regular physical activity.  Eating a healthy diet.  Avoiding tobacco and drug use.  Limiting alcohol use.  Practicing safe sex.  Taking low doses of aspirin every day.  Taking vitamin and mineral supplements as recommended by your health care provider. What happens during an annual well check? The services and screenings done by your health care provider during your annual well check will depend on your age,  overall health, lifestyle risk factors, and family history of disease. Counseling  Your health care provider may ask you questions about your:  Alcohol use.  Tobacco use.  Drug use.  Emotional well-being.  Home and relationship well-being.  Sexual activity.  Eating habits.  History of falls.  Memory and ability to understand (cognition).  Work and work Statistician. Screening  You may have the following tests or measurements:  Height, weight, and BMI.  Blood pressure.  Lipid and cholesterol levels. These may be checked every 5 years, or more frequently if you are over 18 years old.  Skin check.  Lung cancer screening. You may have this screening every year starting at age 23 if you have a 30-pack-year history of smoking and currently smoke or have quit within the past 15 years.  Fecal occult blood test (FOBT) of the stool. You may have this test every year starting at age 72.  Flexible sigmoidoscopy or colonoscopy. You may have a sigmoidoscopy every 5 years or a colonoscopy every 10 years starting at age 36.  Prostate cancer screening. Recommendations will vary depending on your family history and other risks.  Hepatitis C blood test.  Hepatitis B blood test.  Sexually transmitted disease (STD) testing.  Diabetes screening. This is done by checking your blood sugar (glucose) after you have not eaten for a while (fasting). You may have this done every 1-3 years.  Abdominal aortic aneurysm (AAA) screening. You may need this if you are a current or former smoker.  Osteoporosis. You may be screened starting at age 61 if you are at high risk. Talk with your health  care provider about your test results, treatment options, and if necessary, the need for more tests. Vaccines  Your health care provider may recommend certain vaccines, such as:  Influenza vaccine. This is recommended every year.  Tetanus, diphtheria, and acellular pertussis (Tdap, Td) vaccine. You may  need a Td booster every 10 years.  Zoster vaccine. You may need this after age 43.  Pneumococcal 13-valent conjugate (PCV13) vaccine. One dose is recommended after age 53.  Pneumococcal polysaccharide (PPSV23) vaccine. One dose is recommended after age 57. Talk to your health care provider about which screenings and vaccines you need and how often you need them. This information is not intended to replace advice given to you by your health care provider. Make sure you discuss any questions you have with your health care provider. Document Released: 10/24/2015 Document Revised: 06/16/2016 Document Reviewed: 07/29/2015 Elsevier Interactive Patient Education  2017 [place] Prevention in the Home Falls can cause injuries. They can happen to people of all ages. There are many things you can do to make your home safe and to help prevent falls. What can I do on the outside of my home?  Regularly fix the edges of walkways and driveways and fix any cracks.  Remove anything that might make you trip as you walk through a door, such as a raised step or threshold.  Trim any bushes or trees on the path to your home.  Use bright outdoor lighting.  Clear any walking paths of anything that might make someone trip, such as rocks or tools.  Regularly check to see if handrails are loose or broken. Make sure that both sides of any steps have handrails.  Any raised decks and porches should have guardrails on the edges.  Have any leaves, snow, or ice cleared regularly.  Use sand or salt on walking paths during winter.  Clean up any spills in your garage right away. This includes oil or grease spills. What can I do in the bathroom?  Use night lights.  Install grab bars by the toilet and in the tub and shower. Do not use towel bars as grab bars.  Use non-skid mats or decals in the tub or shower.  If you need to sit down in the shower, use a plastic, non-slip stool.  Keep the floor  dry. Clean up any water that spills on the floor as soon as it happens.  Remove soap buildup in the tub or shower regularly.  Attach bath mats securely with double-sided non-slip rug tape.  Do not have throw rugs and other things on the floor that can make you trip. What can I do in the bedroom?  Use night lights.  Make sure that you have a light by your bed that is easy to reach.  Do not use any sheets or blankets that are too big for your bed. They should not hang down onto the floor.  Have a firm chair that has side arms. You can use this for support while you get dressed.  Do not have throw rugs and other things on the floor that can make you trip. What can I do in the kitchen?  Clean up any spills right away.  Avoid walking on wet floors.  Keep items that you use a lot in easy-to-reach places.  If you need to reach something above you, use a strong step stool that has a grab bar.  Keep electrical cords out of the way.  Do not use floor  polish or wax that makes floors slippery. If you must use wax, use non-skid floor wax.  Do not have throw rugs and other things on the floor that can make you trip. What can I do with my stairs?  Do not leave any items on the stairs.  Make sure that there are handrails on both sides of the stairs and use them. Fix handrails that are broken or loose. Make sure that handrails are as long as the stairways.  Check any carpeting to make sure that it is firmly attached to the stairs. Fix any carpet that is loose or worn.  Avoid having throw rugs at the top or bottom of the stairs. If you do have throw rugs, attach them to the floor with carpet tape.  Make sure that you have a light switch at the top of the stairs and the bottom of the stairs. If you do not have them, ask someone to add them for you. What else can I do to help prevent falls?  Wear shoes that:  Do not have high heels.  Have rubber bottoms.  Are comfortable and fit you  well.  Are closed at the toe. Do not wear sandals.  If you use a stepladder:  Make sure that it is fully opened. Do not climb a closed stepladder.  Make sure that both sides of the stepladder are locked into place.  Ask someone to hold it for you, if possible.  Clearly mark and make sure that you can see:  Any grab bars or handrails.  First and last steps.  Where the edge of each step is.  Use tools that help you move around (mobility aids) if they are needed. These include:  Canes.  Walkers.  Scooters.  Crutches.  Turn on the lights when you go into a dark area. Replace any light bulbs as soon as they burn out.  Set up your furniture so you have a clear path. Avoid moving your furniture around.  If any of your floors are uneven, fix them.  If there are any pets around you, be aware of where they are.  Review your medicines with your doctor. Some medicines can make you feel dizzy. This can increase your chance of falling. Ask your doctor what other things that you can do to help prevent falls. This information is not intended to replace advice given to you by your health care provider. Make sure you discuss any questions you have with your health care provider. Document Released: 07/24/2009 Document Revised: 03/04/2016 Document Reviewed: 11/01/2014 Elsevier Interactive Patient Education  2017 [employer].

## 2019-08-07 ENCOUNTER — Telehealth: Payer: Self-pay

## 2019-08-07 ENCOUNTER — Other Ambulatory Visit: Payer: Self-pay

## 2019-08-08 ENCOUNTER — Encounter: Payer: Self-pay | Admitting: Nurse Practitioner

## 2019-08-08 ENCOUNTER — Ambulatory Visit (INDEPENDENT_AMBULATORY_CARE_PROVIDER_SITE_OTHER): Payer: Medicare Other | Admitting: Nurse Practitioner

## 2019-08-08 ENCOUNTER — Ambulatory Visit: Payer: Medicare Other | Admitting: Family Medicine

## 2019-08-08 VITALS — BP 133/71 | HR 73 | Temp 98.9°F

## 2019-08-08 DIAGNOSIS — I739 Peripheral vascular disease, unspecified: Secondary | ICD-10-CM | POA: Diagnosis not present

## 2019-08-08 DIAGNOSIS — G629 Polyneuropathy, unspecified: Secondary | ICD-10-CM | POA: Diagnosis not present

## 2019-08-08 NOTE — Progress Notes (Addendum)
BP 133/71   Pulse 73   Temp 98.9 F (37.2 C) (Oral)   SpO2 97%    Subjective:    Patient ID: [name], male    DOB: [birth year], 78 y.o.   MRN: [id number]  HPI: [name] is a 78 y.o. male  Chief Complaint  Patient presents with  . Peripheral Neuropathy    6 week f/up  . Toe Injury    big toe, left foot. Pt's son states that the patient looked like he had a bruise about 3 weeks ago but has since gotten darker in color  . Referral    requesting referral to vein and vascular, and also podiatry for toenail trimming   Pt son present at bedside to assist with HPI and ROS.  PERIPHERAL NEUROPATHY AND TOE INJURY: Noticed black great left toe this weekend, prior to this there had been a bruise there (a few weeks back first noticed).  Patient and son do not recall how this area was bruised.  Diabetic with last A1C was 5.3%, last in July.  Has underlying PAD and neuropathy.  Son reports patient has seen vascular before, but does not recall who.  He reports patient does not wear slippers around house, but goes bare foot or with compression hose on. Duration: days Involved foot: left Mechanism of injury: unknown Location:  Onset: gradual  Status: worse Treatments attempted: alcohol at home Swelling: no Redness: no Bruising: no Paresthesias / decreased sensation: yes Fevers:no  Relevant past medical, surgical, family and social history reviewed and updated as indicated. Interim medical history since our last visit reviewed. Allergies and medications reviewed and updated.  Review of Systems  Constitutional: Negative for activity change, diaphoresis, fatigue and fever.  Respiratory: Negative for cough, chest tightness, shortness of breath and wheezing.   Cardiovascular: Negative for chest pain, palpitations and leg swelling.  Gastrointestinal: Negative for abdominal distention, abdominal pain, constipation, diarrhea, nausea and vomiting.  Skin: Positive for wound.   Psychiatric/Behavioral: Negative.     Per HPI unless specifically indicated above     Objective:    BP 133/71   Pulse 73   Temp 98.9 F (37.2 C) (Oral)   SpO2 97%   Wt Readings from Last 3 Encounters:  05/01/19 166 lb (75.3 kg)  04/17/19 170 lb (77.1 kg)  06/15/18 110 lb 3.7 oz (50 kg)    Physical Exam Vitals signs and nursing note reviewed.  Constitutional:      General: He is awake. He is not in acute distress.    Appearance: He is well-developed and overweight. He is not ill-appearing.  HENT:     Head: Normocephalic and atraumatic.     Right Ear: Hearing normal. No drainage.     Left Ear: Hearing normal. No drainage.  Eyes:     General: Lids are normal.        Right eye: No discharge.        Left eye: No discharge.     Conjunctiva/sclera: Conjunctivae normal.     Pupils: Pupils are equal, round, and reactive to light.  Neck:     Musculoskeletal: Normal range of motion and neck supple.     Vascular: No carotid bruit.  Cardiovascular:     Rate and Rhythm: Normal rate and regular rhythm.     Pulses:          Dorsalis pedis pulses are 2+ on the right side and 1+ on the left side.  Posterior tibial pulses are 1+ on the right side and 1+ on the left side.     Heart sounds: Normal heart sounds, S1 normal and S2 normal. No murmur. No gallop.   Pulmonary:     Effort: Pulmonary effort is normal. No accessory muscle usage or respiratory distress.     Breath sounds: Normal breath sounds.  Abdominal:     General: Bowel sounds are normal.     Palpations: Abdomen is soft.  Musculoskeletal: Normal range of motion.     Right lower leg: No edema.     Left lower leg: No edema.       Feet:  Feet:     Right foot:     Protective Sensation: 10 sites tested. 0 sites sensed.     Skin integrity: Dry skin present.     Toenail Condition: Right toenails are abnormally thick.     Left foot:     Protective Sensation: 0 sites tested. 10 sites sensed.     Toenail Condition: Left  toenails are abnormally thick.  Skin:    General: Skin is warm and dry.  Neurological:     Mental Status: He is alert and oriented to person, place, and time.     Deep Tendon Reflexes:     Reflex Scores:      Brachioradialis reflexes are 1+ on the right side and 1+ on the left side.      Patellar reflexes are 1+ on the right side and 1+ on the left side. Psychiatric:        Mood and Affect: Mood normal.        Behavior: Behavior normal. Behavior is cooperative.        Thought Content: Thought content normal.        Judgment: Judgment normal.    Diabetic Foot Exam - Simple   Simple Foot Form Visual Inspection See comments: Yes Sensation Testing See comments: Yes Pulse Check See comments: Yes Comments Pulses right foot 2+ DP/1+ PT and left foot 1+ DP/PT.  Sensation poor bilateral feet, 0/10 to all locations.  Xerosis noted and hemosiderin staining.  No edema.  Thick, yellow toenails.     Results for orders placed or performed in visit on 06/28/19  TSH  Result Value Ref Range   TSH 2.970 0.450 - 4.500 uIU/mL      Assessment & Plan:   Problem List Items Addressed This Visit      Cardiovascular and Mediastinum   PAD (peripheral artery disease) ([place]) - Primary    Ongoing with current injury to left great toe with dark, thick crusting.  Urgent referral to vascular placed for further assessment of area and recommend avoiding pressure to area, wear slippers with good tread vs running shoes.  Refuses medications.      Relevant Orders   Ambulatory referral to Vascular Surgery   Ambulatory referral to Podiatry     Nervous and Auditory   Peripheral neuropathy    Chronic, ongoing with decreased sensation bilaterally.  Have recommend he wear slippers at home to prevent injury vs going barefoot or only in compression hose.  High risk for injury or skin breakdown.  Referral to podiatry for nail care placed.      Relevant Orders   Ambulatory referral to Podiatry       Follow  up plan: Return in about 1 week (around 08/15/2019) for Follow-up great toe wound (with PCP or myself).

## 2019-08-08 NOTE — Assessment & Plan Note (Signed)
Ongoing with current injury to left great toe with dark, thick crusting.  Urgent referral to vascular placed for further assessment of area and recommend avoiding pressure to area, wear slippers with good tread vs running shoes.  Refuses medications.

## 2019-08-08 NOTE — Assessment & Plan Note (Signed)
Chronic, ongoing with decreased sensation bilaterally.  Have recommend he wear slippers at home to prevent injury vs going barefoot or only in compression hose.  High risk for injury or skin breakdown.  Referral to podiatry for nail care placed.

## 2019-08-08 NOTE — Patient Instructions (Signed)

## 2019-08-09 ENCOUNTER — Other Ambulatory Visit (INDEPENDENT_AMBULATORY_CARE_PROVIDER_SITE_OTHER): Payer: Self-pay | Admitting: Vascular Surgery

## 2019-08-09 ENCOUNTER — Ambulatory Visit: Payer: Medicare Other | Admitting: Family Medicine

## 2019-08-09 ENCOUNTER — Ambulatory Visit (INDEPENDENT_AMBULATORY_CARE_PROVIDER_SITE_OTHER): Payer: Medicare Other | Admitting: Vascular Surgery

## 2019-08-09 ENCOUNTER — Other Ambulatory Visit (INDEPENDENT_AMBULATORY_CARE_PROVIDER_SITE_OTHER): Payer: Medicare Other

## 2019-08-09 ENCOUNTER — Encounter (INDEPENDENT_AMBULATORY_CARE_PROVIDER_SITE_OTHER): Payer: Self-pay | Admitting: Vascular Surgery

## 2019-08-09 ENCOUNTER — Other Ambulatory Visit: Payer: Self-pay

## 2019-08-09 VITALS — BP 147/63 | HR 87 | Resp 16

## 2019-08-09 DIAGNOSIS — I10 Essential (primary) hypertension: Secondary | ICD-10-CM | POA: Insufficient documentation

## 2019-08-09 DIAGNOSIS — N181 Chronic kidney disease, stage 1: Secondary | ICD-10-CM | POA: Diagnosis not present

## 2019-08-09 DIAGNOSIS — E039 Hypothyroidism, unspecified: Secondary | ICD-10-CM | POA: Diagnosis not present

## 2019-08-09 DIAGNOSIS — I7025 Atherosclerosis of native arteries of other extremities with ulceration: Secondary | ICD-10-CM | POA: Diagnosis not present

## 2019-08-09 DIAGNOSIS — R208 Other disturbances of skin sensation: Secondary | ICD-10-CM

## 2019-08-09 DIAGNOSIS — R0989 Other specified symptoms and signs involving the circulatory and respiratory systems: Secondary | ICD-10-CM | POA: Diagnosis not present

## 2019-08-09 DIAGNOSIS — I739 Peripheral vascular disease, unspecified: Secondary | ICD-10-CM | POA: Diagnosis not present

## 2019-08-09 DIAGNOSIS — E1122 Type 2 diabetes mellitus with diabetic chronic kidney disease: Secondary | ICD-10-CM

## 2019-08-09 NOTE — Progress Notes (Signed)
MRN : [id number]  [name] is a 78 y.o. ([birth year]) male who presents with chief complaint of No chief complaint on file. Marland Kitchen  History of Present Illness:   The patient is seen for evaluation of painful lower extremities and diminished pulses associated with ulceration of the foot.  He was last seen about two years ago and angiogram was recommended at that time but it did not occur.  The patient notes the ulcer has been present for multiple weeks and has not been improving.  It is very painful and has had some drainage.  No specific history of trauma noted by the patient.  The patient denies fever or chills.  the patient does have diabetes which has been difficult to control.  Patient notes prior to the ulcer developing the extremities were painful particularly with ambulation or activity and the discomfort is very consistent day today. Typically, the pain occurs at less than one block, progress is as activity continues to the point that the patient must stop walking. Resting including standing still for several minutes allowed resumption of the activity and the ability to walk a similar distance before stopping again. Uneven terrain and inclined shorten the distance. The pain has been progressive over the past several years.   The patient denies rest pain or dangling of an extremity off the side of the bed during the night for relief. No prior interventions or surgeries.  No history of back problems or DJD of the lumbar sacral spine.   The patient denies amaurosis fugax or recent TIA symptoms. There are no recent neurological changes noted. The patient denies history of DVT, PE or superficial thrombophlebitis. The patient denies recent episodes of angina or shortness of breath.   ABI's Rt=0.87 and Lt=0.66 monophasic signals at the ankle level.  No outpatient medications have been marked as taking for the 08/09/19 encounter (Appointment) with Delana Meyer, Dolores Lory, MD.   Current  Facility-Administered Medications for the 08/09/19 encounter (Appointment) with Delana Meyer, Dolores Lory, MD  Medication  . cyanocobalamin ((VITAMIN B-12)) injection 1,000 mcg    Past Medical History:  Diagnosis Date  . Arthritis   . 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 Diabetes mellitus   . Dyspnea   . Goals of care, counseling/discussion 09/08/2017  . 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 Hypertension   . Hypothyroidism   . Neuropathy   . Peripheral vascular disease ([place])   . 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 Venous stasis     Past Surgical History:  Procedure Laterality Date  . CATARACT EXTRACTION W/PHACO Right 10/25/2016   Procedure: CATARACT EXTRACTION PHACO AND INTRAOCULAR LENS PLACEMENT ([facility])  right eye complicated;  Surgeon: Ronnell Freshwater, MD;  Location: [facility];  Service: Ophthalmology;  Laterality: Right;  Right eye Diabetic - oral meds Vision blue  . FINGER SURGERY      Social History Social History   Tobacco Use  . Smoking status: Former Smoker    Packs/day: 0.25    Types: Cigarettes    Quit date: 03/23/2015    Years since quitting: 4.3  . Smokeless tobacco: Current User    Types: Chew  Substance Use Topics  . Alcohol use: Not Currently    Alcohol/week: 42.0 standard drinks    Types: 42 Cans of beer per week    Comment: not currently  . Drug use: No    Family  History Family History  Problem Relation Age of Onset  . Heart disease Mother   . Alzheimer's disease Sister   . Heart disease Maternal Aunt   No family history of bleeding/clotting disorders, porphyria or autoimmune disease   Allergies  Allergen Reactions  . Metformin And Related Nausea And Vomiting     REVIEW OF SYSTEMS (Negative unless checked)  Constitutional: [] Weight loss  [] Fever  [] Chills Cardiac: [] Chest pain   [] Chest pressure   [] Palpitations   [] Shortness of breath when laying flat    [] Shortness of breath with exertion. Vascular:  [] Pain in legs with walking   [] Pain in legs at rest  [] History of DVT   [] Phlebitis   [] Swelling in legs   [] Varicose veins   [] Non-healing ulcers Pulmonary:   [] Uses home oxygen   [] Productive cough   [] Hemoptysis   [] Wheeze  [x] COPD   [] Asthma Neurologic:  [] Dizziness   [] Seizures   [] History of stroke   [] History of TIA  [] Aphasia   [] Vissual changes   [] Weakness or numbness in arm   [x] Weakness or numbness in leg Musculoskeletal:   [] Joint swelling   [x] Joint pain   [] Low back pain Hematologic:  [] Easy bruising  [] Easy bleeding   [] Hypercoagulable state   [] Anemic Gastrointestinal:  [] Diarrhea   [] Vomiting  [] Gastroesophageal reflux/heartburn   [] Difficulty swallowing. Genitourinary:  [x] Chronic kidney disease   [] Difficult urination  [] Frequent urination   [] Blood in urine Skin:  [] Rashes   [] Ulcers  Psychological:  [] History of anxiety   []  History of major depression.  Physical Examination  There were no vitals filed for this visit. There is no height or weight on file to calculate BMI. Gen: WD/WN, NAD Head: [place]/AT, No temporalis wasting.  Ear/Nose/Throat: Hearing grossly intact, nares w/o erythema or drainage, poor dentition Eyes: PER, EOMI, sclera nonicteric.  Neck: Supple, no masses.  No bruit or JVD.  Pulmonary:  Good air movement, clear to auscultation bilaterally, no use of accessory muscles.  Cardiac: RRR, normal S1, S2, no Murmurs. Vascular:  Dry gangrene of the left great toe noninfected Vessel Right Left  Radial Palpable Palpable  Popliteal Not Palpable Not Palpable  PT Not Palpable Not Palpable  DP Not Palpable Not Palpable  Gastrointestinal: soft, non-distended. No guarding/no peritoneal signs.  Musculoskeletal: M/S 5/5 throughout.  No deformity or atrophy.  Neurologic: CN 2-12 intact. Pain and light touch intact in extremities.  Symmetrical.  Speech is fluent. Motor exam as listed above. Psychiatric: Judgment intact,  Mood & affect appropriate for pt's clinical situation. Dermatologic: No rashes + ulcer of left great toe noted.  No changes consistent with cellulitis. Lymph : No Cervical lymphadenopathy, no lichenification or skin changes of chronic lymphedema.  CBC Lab Results  Component Value Date   WBC 2.0 (LL) 04/17/2019   HGB 9.5 (L) 04/17/2019   HCT 28.1 (L) 04/17/2019   MCV 100 (H) 04/17/2019   PLT 73 (LL) 04/17/2019    BMET    Component Value Date/Time   NA 143 04/17/2019 1309   NA 141 01/13/2013 0432   K 3.4 (L) 04/17/2019 1309   K 3.8 01/13/2013 0432   CL 108 (H) 04/17/2019 1309   CL 106 01/13/2013 0432   CO2 22 04/17/2019 1309   CO2 29 01/13/2013 0432   GLUCOSE 187 (H) 04/17/2019 1309   GLUCOSE 137 (H) 06/15/2018 1805   GLUCOSE 106 (H) 01/13/2013 0432   BUN 8 04/17/2019 1309   BUN 7 01/13/2013 0432   CREATININE 0.69 (L) 04/17/2019  1309   CREATININE 0.87 01/13/2013 0432   CALCIUM 8.5 (L) 04/17/2019 1309   CALCIUM 8.2 (L) 01/13/2013 0432   GFRNONAA 92 04/17/2019 1309   GFRNONAA >60 01/13/2013 0432   GFRAA 106 04/17/2019 1309   GFRAA >60 01/13/2013 0432   CrCl cannot be calculated (Patient's most recent lab result is older than the maximum 21 days allowed.).  COAG Lab Results  Component Value Date   INR 1.29 06/03/2018   INR 1.14 03/28/2018   INR 1.16 08/09/2017    Radiology No results found.   Assessment/Plan 1. Atherosclerosis of native arteries of the extremities with ulceration ([place])  Recommend:  The patient has evidence of severe atherosclerotic changes of both lower extremities associated with ulceration and tissue loss of the left foot.  This represents a limb threatening ischemia and places the patient at the risk for left limb loss.  Patient should undergo angiography of the left lower extremities with the hope for intervention for left limb salvage.  The risks and benefits as well as the alternative therapies was discussed in detail with the patient.  All  questions were answered.  Patient agrees to proceed with left leg angiography.  The patient will follow up with me in the office after the procedure.    2. Essential hypertension Continue antihypertensive medications as already ordered, these medications have been reviewed and there are no changes at this time.   3. CKD stage 1 due to type 2 diabetes mellitus ([place]) Avoid nephrotoxic medications and dehydration.  Further plans per nephrology  4. Hypothyroidism, unspecified type Continue thyroid hormone replacement as already ordered, these medications have been reviewed and there are no changes at this time.     Hortencia Pilar, MD  08/09/2019 8:32 AM

## 2019-08-13 ENCOUNTER — Telehealth (INDEPENDENT_AMBULATORY_CARE_PROVIDER_SITE_OTHER): Payer: Self-pay

## 2019-08-13 NOTE — Telephone Encounter (Signed)
Spoke with the patient's son and he is now scheduled with Dr. Delana Meyer for left leg angio on 08/21/2019 with a 11:00 am arrival time to the [facility]. Patient will do Covid testing on 08/17/2019 between 12:30-2:30 pm at the [facility]. Pre-procedure instructions were discussed and will be mailed to the patient.

## 2019-08-16 ENCOUNTER — Other Ambulatory Visit: Payer: Self-pay

## 2019-08-17 ENCOUNTER — Ambulatory Visit: Payer: Medicare Other | Admitting: Family Medicine

## 2019-08-17 ENCOUNTER — Telehealth: Payer: Self-pay

## 2019-08-17 ENCOUNTER — Other Ambulatory Visit
Admission: RE | Admit: 2019-08-17 | Discharge: 2019-08-17 | Disposition: A | Discharge status: Home / Self Care | Payer: Medicare Other | Source: Ambulatory Visit | Attending: Vascular Surgery | Admitting: Vascular Surgery

## 2019-08-17 ENCOUNTER — Ambulatory Visit (INDEPENDENT_AMBULATORY_CARE_PROVIDER_SITE_OTHER): Payer: Medicare Other | Admitting: Family Medicine

## 2019-08-17 ENCOUNTER — Other Ambulatory Visit: Payer: Self-pay

## 2019-08-17 ENCOUNTER — Encounter: Payer: Self-pay | Admitting: Family Medicine

## 2019-08-17 VITALS — BP 150/71 | HR 92 | Temp 98.7°F

## 2019-08-17 DIAGNOSIS — K0889 Other specified disorders of teeth and supporting structures: Secondary | ICD-10-CM | POA: Diagnosis not present

## 2019-08-17 DIAGNOSIS — Z01812 Encounter for preprocedural laboratory examination: Secondary | ICD-10-CM | POA: Diagnosis not present

## 2019-08-17 DIAGNOSIS — Z20828 Contact with and (suspected) exposure to other viral communicable diseases: Secondary | ICD-10-CM | POA: Diagnosis not present

## 2019-08-17 DIAGNOSIS — I7025 Atherosclerosis of native arteries of other extremities with ulceration: Secondary | ICD-10-CM

## 2019-08-17 NOTE — Assessment & Plan Note (Signed)
Working with Vascular Surgery, awaiting angiography of LLE next week

## 2019-08-17 NOTE — Progress Notes (Signed)
BP (!) 150/71   Pulse 92   Temp 98.7 F (37.1 C) (Oral)   SpO2 93%    Subjective:    Patient ID: [name], male    DOB: 1941-09-29, 78 y.o.   MRN: [id number]  HPI: [name] is a 78 y.o. male  Chief Complaint  Patient presents with  . PAD    1 week f/up  . Dental Pain    pt states he has a tooth that has been hurting for a while   Presenting today for 1 week f/u left great toe ulceration and necrosis from severe PVD. Was able to be seen last week by Vascular specialist and is scheduled for angiography next week. Will also be establishing with Podiatry next week for foot care. He is making an effort to start wearing shoes at all times to help protect his feet. Has significant neuropathy so does not feel any pain in area of discoloration on great toe. States his condition is unchanged since his visit here last week.   Also notes several months of pain associated with his only remaining tooth on the right side lower jaw. States he does not brush it, has not for years. Uses oragel prn for the pain. Denies swelling, drainage, fevers, chills, sweats. Has not seen a dentist in many years.   Relevant past medical, surgical, family and social history reviewed and updated as indicated. Interim medical history since our last visit reviewed. Allergies and medications reviewed and updated.  Review of Systems  Per HPI unless specifically indicated above     Objective:    BP (!) 150/71   Pulse 92   Temp 98.7 F (37.1 C) (Oral)   SpO2 93%   Wt Readings from Last 3 Encounters:  05/01/19 166 lb (75.3 kg)  04/17/19 170 lb (77.1 kg)  06/15/18 110 lb 3.7 oz (50 kg)    Physical Exam Vitals signs and nursing note reviewed.  Constitutional:      Appearance: Normal appearance.  HENT:     Head: Atraumatic.     Mouth/Throat:     Comments: significant decay and black discoloration of solitary tooth right lower jaw, no gingival erythema, edema or abscess present, no active drainage  Eyes:     Extraocular Movements: Extraocular movements intact.     Conjunctiva/sclera: Conjunctivae normal.  Neck:     Musculoskeletal: Normal range of motion and neck supple.  Cardiovascular:     Rate and Rhythm: Normal rate and regular rhythm.  Pulmonary:     Effort: Pulmonary effort is normal.     Breath sounds: Normal breath sounds.  Musculoskeletal: Normal range of motion.  Skin:    General: Skin is warm and dry.     Comments: Left great toe with thickened black discolored area at distal tip, not draining or erythematous around base. Diminished peripheral pulses. Toenail thickened and loose  Neurological:     Mental Status: Mental status is at baseline.  Psychiatric:        Mood and Affect: Mood normal.        Thought Content: Thought content normal.        Judgment: Judgment normal.     Results for orders placed or performed in visit on 06/28/19  TSH  Result Value Ref Range   TSH 2.970 0.450 - 4.500 uIU/mL      Assessment & Plan:   Problem List Items Addressed This Visit      Cardiovascular and Mediastinum  Atherosclerosis of native arteries of the extremities with ulceration [facility])    Working with Vascular Surgery, awaiting angiography of LLE next week       Other Visit Diagnoses    Tooth pain    -  Primary   From poor oral hygiene, dental decay. Urgent referral to Dentistry sent, discussed salt water gargles, oragel prn. No obvious infection at this time   Relevant Orders   Ambulatory referral to Dentistry       Follow up plan: Return for as scheduled.

## 2019-08-18 LAB — SARS CORONAVIRUS 2 (TAT 6-24 HRS): SARS Coronavirus 2: NEGATIVE

## 2019-08-20 ENCOUNTER — Other Ambulatory Visit (INDEPENDENT_AMBULATORY_CARE_PROVIDER_SITE_OTHER): Payer: Self-pay | Admitting: Nurse Practitioner

## 2019-08-21 ENCOUNTER — Encounter (INDEPENDENT_AMBULATORY_CARE_PROVIDER_SITE_OTHER): Payer: Self-pay

## 2019-08-21 NOTE — Telephone Encounter (Signed)
Patient has been rescheduled from 08/21/2019 to 08/28/2019 with a 2:00 pm arrival time to the [facility]. Patient will do Covid testing on 08/24/2019 between 12:30-2:30 pm. The new time and day will be mailed to the patient.

## 2019-08-24 ENCOUNTER — Other Ambulatory Visit: Payer: Self-pay

## 2019-08-24 ENCOUNTER — Other Ambulatory Visit
Admission: RE | Admit: 2019-08-24 | Discharge: 2019-08-24 | Disposition: A | Discharge status: Home / Self Care | Payer: Medicare Other | Source: Ambulatory Visit | Attending: Vascular Surgery | Admitting: Vascular Surgery

## 2019-08-24 DIAGNOSIS — Z01812 Encounter for preprocedural laboratory examination: Secondary | ICD-10-CM | POA: Diagnosis not present

## 2019-08-24 DIAGNOSIS — Z20828 Contact with and (suspected) exposure to other viral communicable diseases: Secondary | ICD-10-CM | POA: Diagnosis not present

## 2019-08-25 LAB — SARS CORONAVIRUS 2 (TAT 6-24 HRS): SARS Coronavirus 2: NEGATIVE

## 2019-08-27 MED ORDER — CEFAZOLIN SODIUM-DEXTROSE 2-4 GM/100ML-% IV SOLN
2.0000 g | Freq: Once | INTRAVENOUS | Status: AC
  Administered 2019-08-28: 16:00:00 2 g via INTRAVENOUS

## 2019-08-28 ENCOUNTER — Encounter
Admission: RE | Disposition: A | Discharge status: Home / Self Care | Payer: Self-pay | Source: Home / Self Care | Attending: Vascular Surgery

## 2019-08-28 ENCOUNTER — Encounter: Payer: Self-pay | Admitting: *Deleted

## 2019-08-28 ENCOUNTER — Other Ambulatory Visit: Payer: Self-pay

## 2019-08-28 ENCOUNTER — Ambulatory Visit
Admission: RE | Admit: 2019-08-28 | Discharge: 2019-08-28 | Disposition: A | Discharge status: Home / Self Care | Payer: Medicare Other | Attending: Vascular Surgery | Admitting: Vascular Surgery

## 2019-08-28 DIAGNOSIS — E1140 Type 2 diabetes mellitus with diabetic neuropathy, unspecified: Secondary | ICD-10-CM | POA: Insufficient documentation

## 2019-08-28 DIAGNOSIS — I70203 Unspecified atherosclerosis of native arteries of extremities, bilateral legs: Secondary | ICD-10-CM | POA: Diagnosis not present

## 2019-08-28 DIAGNOSIS — E039 Hypothyroidism, unspecified: Secondary | ICD-10-CM | POA: Diagnosis not present

## 2019-08-28 DIAGNOSIS — I70233 Atherosclerosis of native arteries of right leg with ulceration of ankle: Secondary | ICD-10-CM

## 2019-08-28 DIAGNOSIS — L97529 Non-pressure chronic ulcer of other part of left foot with unspecified severity: Secondary | ICD-10-CM | POA: Diagnosis not present

## 2019-08-28 DIAGNOSIS — E1151 Type 2 diabetes mellitus with diabetic peripheral angiopathy without gangrene: Secondary | ICD-10-CM | POA: Diagnosis not present

## 2019-08-28 DIAGNOSIS — N181 Chronic kidney disease, stage 1: Secondary | ICD-10-CM | POA: Diagnosis not present

## 2019-08-28 DIAGNOSIS — I129 Hypertensive chronic kidney disease with stage 1 through stage 4 chronic kidney disease, or unspecified chronic kidney disease: Secondary | ICD-10-CM | POA: Diagnosis not present

## 2019-08-28 DIAGNOSIS — Z87891 Personal history of nicotine dependence: Secondary | ICD-10-CM | POA: Diagnosis not present

## 2019-08-28 DIAGNOSIS — L97909 Non-pressure chronic ulcer of unspecified part of unspecified lower leg with unspecified severity: Secondary | ICD-10-CM

## 2019-08-28 DIAGNOSIS — E11621 Type 2 diabetes mellitus with foot ulcer: Secondary | ICD-10-CM | POA: Diagnosis not present

## 2019-08-28 DIAGNOSIS — E1122 Type 2 diabetes mellitus with diabetic chronic kidney disease: Secondary | ICD-10-CM | POA: Insufficient documentation

## 2019-08-28 DIAGNOSIS — I70299 Other atherosclerosis of native arteries of extremities, unspecified extremity: Secondary | ICD-10-CM

## 2019-08-28 DIAGNOSIS — I70234 Atherosclerosis of native arteries of right leg with ulceration of heel and midfoot: Secondary | ICD-10-CM

## 2019-08-28 HISTORY — PX: LOWER EXTREMITY ANGIOGRAPHY: CATH118251

## 2019-08-28 LAB — BUN: BUN: 9 mg/dL (ref 8–23)

## 2019-08-28 LAB — GLUCOSE, CAPILLARY
Glucose-Capillary: 113 mg/dL — ABNORMAL HIGH (ref 70–99)
Glucose-Capillary: 102 mg/dL — ABNORMAL HIGH (ref 70–99)

## 2019-08-28 LAB — CREATININE, SERUM
Creatinine, Ser: 0.67 mg/dL (ref 0.61–1.24)
GFR calc Af Amer: >60 mL/min (ref >60)
GFR calc non Af Amer: >60 mL/min (ref >60)

## 2019-08-28 SURGERY — LOWER EXTREMITY ANGIOGRAPHY
Anesthesia: Moderate Sedation | Laterality: Left

## 2019-08-28 MED ORDER — FENTANYL CITRATE (PF) 100 MCG/2ML IJ SOLN
INTRAMUSCULAR | Status: AC
  Filled 2019-08-28: qty 2

## 2019-08-28 MED ORDER — OXYCODONE HCL 5 MG PO TABS: 5.0000 mg | ORAL_TABLET | ORAL | Status: DC | PRN

## 2019-08-28 MED ORDER — DIPHENHYDRAMINE HCL 50 MG/ML IJ SOLN: 50.0000 mg | Freq: Once | INTRAMUSCULAR | Status: DC | PRN

## 2019-08-28 MED ORDER — MIDAZOLAM HCL 5 MG/5ML IJ SOLN
INTRAMUSCULAR | Status: AC
  Filled 2019-08-28: qty 5

## 2019-08-28 MED ORDER — HYDROMORPHONE HCL 1 MG/ML IJ SOLN: 1.0000 mg | Freq: Once | INTRAMUSCULAR | Status: DC | PRN

## 2019-08-28 MED ORDER — ACETAMINOPHEN 325 MG PO TABS: 650.0000 mg | ORAL_TABLET | ORAL | Status: DC | PRN

## 2019-08-28 MED ORDER — LABETALOL HCL 5 MG/ML IV SOLN
INTRAVENOUS | Status: AC
  Administered 2019-08-28: 10 mg via INTRAVENOUS
  Filled 2019-08-28: qty 4

## 2019-08-28 MED ORDER — MORPHINE SULFATE (PF) 4 MG/ML IV SOLN: 2.0000 mg | INTRAVENOUS | Status: DC | PRN

## 2019-08-28 MED ORDER — FAMOTIDINE 20 MG PO TABS: 40.0000 mg | ORAL_TABLET | Freq: Once | ORAL | Status: DC | PRN

## 2019-08-28 MED ORDER — HEPARIN SODIUM (PORCINE) 1000 UNIT/ML IJ SOLN
INTRAMUSCULAR | Status: DC | PRN
  Administered 2019-08-28: 5000 [IU] via INTRAVENOUS

## 2019-08-28 MED ORDER — FENTANYL CITRATE (PF) 100 MCG/2ML IJ SOLN
INTRAMUSCULAR | Status: DC | PRN
  Administered 2019-08-28 (×2): 25 ug via INTRAVENOUS
  Administered 2019-08-28: 50 ug via INTRAVENOUS
  Administered 2019-08-28: 25 ug via INTRAVENOUS

## 2019-08-28 MED ORDER — SODIUM CHLORIDE 0.9 % IV SOLN: 250.0000 mL | INTRAVENOUS | Status: DC | PRN

## 2019-08-28 MED ORDER — SODIUM CHLORIDE 0.9% FLUSH: 3.0000 mL | Freq: Two times a day (BID) | INTRAVENOUS | Status: DC

## 2019-08-28 MED ORDER — MIDAZOLAM HCL 2 MG/2ML IJ SOLN
INTRAMUSCULAR | Status: DC | PRN
  Administered 2019-08-28: 1 mg via INTRAVENOUS
  Administered 2019-08-28: 2 mg via INTRAVENOUS
  Administered 2019-08-28 (×2): 1 mg via INTRAVENOUS

## 2019-08-28 MED ORDER — METHYLPREDNISOLONE SODIUM SUCC 125 MG IJ SOLR: 125.0000 mg | Freq: Once | INTRAMUSCULAR | Status: DC | PRN

## 2019-08-28 MED ORDER — MIDAZOLAM HCL 2 MG/ML PO SYRP: 8.0000 mg | ORAL_SOLUTION | Freq: Once | ORAL | Status: DC | PRN

## 2019-08-28 MED ORDER — IODIXANOL 320 MG/ML IV SOLN
INTRAVENOUS | Status: DC | PRN
  Administered 2019-08-28: 110 mL via INTRA_ARTERIAL

## 2019-08-28 MED ORDER — SODIUM CHLORIDE 0.9 % IV SOLN
INTRAVENOUS | Status: DC
  Administered 2019-08-28: 15:00:00 via INTRAVENOUS

## 2019-08-28 MED ORDER — HEPARIN SODIUM (PORCINE) 1000 UNIT/ML IJ SOLN
INTRAMUSCULAR | Status: AC
  Filled 2019-08-28: qty 1

## 2019-08-28 MED ORDER — HYDRALAZINE HCL 20 MG/ML IJ SOLN: 5.0000 mg | INTRAMUSCULAR | Status: DC | PRN

## 2019-08-28 MED ORDER — SODIUM CHLORIDE 0.9 % IV SOLN: INTRAVENOUS | Status: DC

## 2019-08-28 MED ORDER — ONDANSETRON HCL 4 MG/2ML IJ SOLN: 4.0000 mg | Freq: Four times a day (QID) | INTRAMUSCULAR | Status: DC | PRN

## 2019-08-28 MED ORDER — SODIUM CHLORIDE 0.9% FLUSH: 3.0000 mL | INTRAVENOUS | Status: DC | PRN

## 2019-08-28 MED ORDER — LABETALOL HCL 5 MG/ML IV SOLN
10.0000 mg | INTRAVENOUS | Status: DC | PRN
  Administered 2019-08-28: 19:00:00 10 mg via INTRAVENOUS

## 2019-08-28 MED ORDER — CEFAZOLIN SODIUM-DEXTROSE 2-4 GM/100ML-% IV SOLN
INTRAVENOUS | Status: AC
  Administered 2019-08-28: 16:00:00 2 g via INTRAVENOUS
  Filled 2019-08-28: qty 100

## 2019-08-28 SURGICAL SUPPLY — 22 items
BALLN ULTRVRSE 6X60X130C (BALLOONS) ×3
BALLOON ULTRVRSE 6X60X130C (BALLOONS) ×1 IMPLANT
CATH BEACON 5 .038 100 VERT TP (CATHETERS) ×3 IMPLANT
CATH CROSSER 14S OTW 146CM (CATHETERS) ×3 IMPLANT
CATH PIG 70CM (CATHETERS) ×3 IMPLANT
CATH SIDEKICK ANG 110 GEOALIGN (CATHETERS) ×3 IMPLANT
COVER PROBE U/S 5X48 (MISCELLANEOUS) ×3 IMPLANT
DEVICE PRESTO INFLATION (MISCELLANEOUS) ×3 IMPLANT
DEVICE STARCLOSE SE CLOSURE (Vascular Products) ×3 IMPLANT
DEVICE TORQUE .025-.038 (MISCELLANEOUS) ×3 IMPLANT
GLIDEWIRE ADV .035X260CM (WIRE) ×3 IMPLANT
GLIDEWIRE ANGLED SS 035X260CM (WIRE) ×3 IMPLANT
KIT FLOWMATE PROCEDURAL (KITS) ×3 IMPLANT
NEEDLE ENTRY 21GA 7CM ECHOTIP (NEEDLE) ×3 IMPLANT
PACK ANGIOGRAPHY (CUSTOM PROCEDURE TRAY) ×3 IMPLANT
SET INTRO CAPELLA COAXIAL (SET/KITS/TRAYS/PACK) ×3 IMPLANT
SHEATH ANL2 6FRX45 HC (SHEATH) ×3 IMPLANT
SHEATH BRITE TIP 5FRX11 (SHEATH) ×3 IMPLANT
SYR MEDRAD MARK 7 150ML (SYRINGE) ×3 IMPLANT
TUBING CONTRAST HIGH PRESS 72 (TUBING) ×3 IMPLANT
WIRE J 3MM .035X145CM (WIRE) ×3 IMPLANT
WIRE RUNTHROUGH .014X300CM (WIRE) ×3 IMPLANT

## 2019-08-28 NOTE — H&P (Signed)
[facility] History & Physical Update  The patient was interviewed and re-examined.  The patient's previous History and Physical has been reviewed and is unchanged.  There is no change in the plan of care. We plan to proceed with the scheduled procedure.  Hortencia Pilar, MD  08/28/2019, 3:51 PM

## 2019-08-28 NOTE — Op Note (Signed)
OPERATIVE NOTE   PROCEDURE: 1. Left lower extremity angiography third order catheter placement 2. Ultrasound-guided access left common femoral artery for sheath placement 3. 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  POST-OPERATIVE DIAGNOSIS: Same  SURGEON: Katha Cabal, M.D.  ANESTHESIA: Conscious sedation was administered under my direct supervision by the interventional radiology RN. IV Versed plus fentanyl were utilized. Continuous ECG, pulse oximetry and blood pressure was monitored throughout the entire procedure.  Conscious sedation was for a total of 110 minutes.  ESTIMATED BLOOD LOSS: Minimal cc  FINDING(S): 1.  Diffuse atherosclerotic changes of the left lower extremity, SFA occlusion noted with reconstitution of the above popliteal two-vessel runoff via the anterior tibial and peroneal  SPECIMEN(S):  None  INDICATIONS:   [name] is a 78 y.o. y.o. male who presents with signs of sepsis secondary to infection of the right heel ulcer. He has nonpalpable pulses in association with the heel ulcer. The heel ulcer has not been healing in spite of appropriate wound care.  DESCRIPTION: After obtaining full informed written consent, the patient was brought back to the operating room and placed supine upon the operating table.  The patient received IV antibiotics prior to induction.  After obtaining adequate sedation, the patient was prepped and draped in the standard fashion and appropriate time out is called.    Ultrasound is placed in a sterile sleeve ultrasound as utilized secondary to lack of appropriate landmarks and to avoid vascular injury. Under real-time visualization the right common femoral artery is identified is echolucent and pulsatile indicating patency. Image is recorded for the permanent record. 1% lidocaine is then  infiltrated into the soft tissues with ultrasound visualization. Microneedle is then inserted into the anterior wall of the common femoral artery under direct visualization with ultrasound. Microwire followed by micro-sheath.   J-wire followed by 6 French sheath is then inserted without difficulty.  J-wire is then advanced with pigtail catheter up to the level of T12 and AP projection of the aorta is obtained. Pigtail catheter was repositioned to above the bifurcation and oblique view of the pelvis is obtained.  Using advantage wire wire and the pigtail catheter the aortic bifurcation was crossed and the catheter is advanced down to the external iliac artery. Oblique view of the femoral bifurcation is then obtained by hand injection. The wire is then reintroduced and the catheter is positioned within the SFA. AP projections of the right lower extremity are obtained for distal runoff.  After review the images distal images are not adequate and the wire is reintroduced a 6 [place] Ansell sheath is advanced up and over the bifurcation and positioned with its tip in the superficial femoral artery and subsequently a 100 cm Kumpe catheter is advanced down to the distal SFA. Further imaging of the tibial vessels is then obtained.  Subsequently the crosser atherectomy 73 S catheter was advanced into the cul-de-sac of the SFA.  Multiple attempts at crossing into the true lumen of the above-knee popliteal were unsuccessful.  I also attempted angioplasty to create a fenestration but this was not successful either.  I then elected to terminate the procedure.  After review of the images the catheter is removed sheath is pulled back into the left iliac system J-wire is then advanced and socially and LAO projection of the groin is obtained after review of this image a Star Cose device is deployed without difficulty.  INTERPRETATION: The abdominal aorta is opacified with a bolus injection of contrast. There is diffuse  atherosclerotic changes clearly visible even under fluoroscopy without contrast enhancement. However, there are no hemodynamically significant lesions noted within the aorta, bilateral common iliac arteries and bilateral external iliac arteries.  The left common femoral and profunda femoris arteries are widely patent. The left superficial femoral artery demonstrates diffuse disease proximally and then occludes in its midportion the above-knee popliteal is reconstituted and is patent as it courses posteriorly behind the knee.  Trifurcation has diffuse disease but the anterior tibial and peroneal appear patent down to the ankle posterior tibial appears occluded at its origin remains occluded throughout its course.  The patient will be scheduled for a follow-up angiogram at which time pedal access via the anterior tibial will be obtained as well as retrograde access so that successful negotiation of the SFA occlusion can be accomplished and the lesion can be stented for limb salvage  COMPLICATIONS: Inability to successfully cross the SFA occlusion  CONDITION: Unchanged  Hortencia Pilar, M.D. [facility] Office: [phone]   08/28/2019,6:18 PM

## 2019-08-29 ENCOUNTER — Encounter: Payer: Self-pay | Admitting: Vascular Surgery

## 2019-08-30 DIAGNOSIS — E1142 Type 2 diabetes mellitus with diabetic polyneuropathy: Secondary | ICD-10-CM | POA: Diagnosis not present

## 2019-08-30 DIAGNOSIS — B351 Tinea unguium: Secondary | ICD-10-CM | POA: Diagnosis not present

## 2019-08-30 DIAGNOSIS — I739 Peripheral vascular disease, unspecified: Secondary | ICD-10-CM | POA: Diagnosis not present

## 2019-08-31 ENCOUNTER — Telehealth: Payer: Self-pay | Admitting: Licensed Clinical Social Worker

## 2019-08-31 ENCOUNTER — Telehealth: Payer: Self-pay | Admitting: Family Medicine

## 2019-08-31 ENCOUNTER — Ambulatory Visit: Payer: Self-pay | Admitting: Licensed Clinical Social Worker

## 2019-08-31 NOTE — Telephone Encounter (Signed)
° ° °  Provided reservation # to Patients son [phone] and gave Ride Assist # to schedule return trip home from the [facility] in [place]. Closing referral pending any other needs of patient.  [place] Management ??[email or web address]   ??[id number]      [place] Management ??[email or web address]   ??[id number]

## 2019-08-31 NOTE — Chronic Care Management (AMB) (Signed)
  Care Management   Follow Up Note   08/31/2019 Name: [name] MRN: [id number] DOB: 04-28-1941  Referred by: Valerie Roys, DO Reason for referral : Care Coordination   [name] is a 78 y.o. year old male who is a primary care patient of Valerie Roys, DO. The care management team was consulted for assistance with care management and care coordination needs.    Review of patient status, including review of consultants reports, relevant laboratory and other test results, and collaboration with appropriate care team members and the patient's provider was performed as part of comprehensive patient evaluation and provision of chronic care management services.    LCSW received incoming CRM. Reason for CRM: Pt's son called for advice for if father can place compression socks back on after surgery. Son would also like to know if office can arrange transportation for father to go to dentist appointment on 24th. Please advise.  LCSW completed transportation assistance referral to [employer] on 08/31/2019. LCSW will sent DME request referral to CCM Nurse and will follow up next quarter to reassess SW needs.  The care management team will reach out to the patient again over the next quarter.  Eula Fried, BSW, MSW, [facility] Care Management [email or web address] Phone: [phone]

## 2019-08-31 NOTE — Telephone Encounter (Signed)
° ° °  Called pt's son regarding Community Resource Referral for transportation for 11/24 Dentist appointment. Confirmed details of appt: Tuesday 11/24 @ 8:30am Pick up from  [place], [place] [address]  [facility] [phone] [address] [place], [place] [address]   Will call [employer] to set up travel [place] Management ??[email or web address]   ??[id number]

## 2019-08-31 NOTE — Telephone Encounter (Signed)
Copied from [place] [phone]. Topic: General - Inquiry >> Aug 31, 2019  9:37 AM [name], NT wrote: Reason for CRM: Pt's son called for advice for if father can place compression socks back on after surgery. Son would also like to know if office can arrange transportation for father to go to dentist appointment on 24th. Please advise.

## 2019-09-03 ENCOUNTER — Encounter (INDEPENDENT_AMBULATORY_CARE_PROVIDER_SITE_OTHER): Payer: Self-pay

## 2019-09-03 ENCOUNTER — Telehealth (INDEPENDENT_AMBULATORY_CARE_PROVIDER_SITE_OTHER): Payer: Self-pay

## 2019-09-03 ENCOUNTER — Encounter (INDEPENDENT_AMBULATORY_CARE_PROVIDER_SITE_OTHER): Payer: Self-pay | Admitting: Nurse Practitioner

## 2019-09-03 ENCOUNTER — Ambulatory Visit (INDEPENDENT_AMBULATORY_CARE_PROVIDER_SITE_OTHER): Payer: Medicare Other | Admitting: Nurse Practitioner

## 2019-09-03 ENCOUNTER — Other Ambulatory Visit: Payer: Self-pay

## 2019-09-03 VITALS — BP 115/55 | HR 88 | Resp 16 | Wt 179.0 lb

## 2019-09-03 DIAGNOSIS — I10 Essential (primary) hypertension: Secondary | ICD-10-CM

## 2019-09-03 DIAGNOSIS — I129 Hypertensive chronic kidney disease with stage 1 through stage 4 chronic kidney disease, or unspecified chronic kidney disease: Secondary | ICD-10-CM

## 2019-09-03 DIAGNOSIS — I7025 Atherosclerosis of native arteries of other extremities with ulceration: Secondary | ICD-10-CM | POA: Diagnosis not present

## 2019-09-03 NOTE — Progress Notes (Signed)
SUBJECTIVE:  Patient ID: [name], male    DOB: Nov 19, 1940, 78 y.o.   MRN: [id number] Chief Complaint  Patient presents with  . Follow-up    [facility] 1 week post angio    HPI  [name] is a 78 y.o. male that presents today 1 week after left lower extremity angiogram.  The patient denies any pain following his angiogram.  The patient also endorses very little claudication-like symptoms and the scab on the ulceration on the toe has sloughed off.  During the angiogram it was found that the mid SFA has an occlusion that was unable to be crossed.  The patient denies any excessive claudication-like symptoms or rest pain.  He denies any fever, chills, nausea, vomiting or diarrhea.  He denies any extensive bleeding following the first angiogram.  Past Medical History:  Diagnosis Date  . Arthritis   . 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 Diabetes mellitus   . Dyspnea   . Goals of care, counseling/discussion 09/08/2017  . 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 Hypertension   . Hypothyroidism   . Neuropathy   . Peripheral vascular disease ([place])   . Type 2 diabetes, controlled, with neuropathy ([place]) 04/07/2015  . Venous stasis     Past Surgical History:  Procedure Laterality Date  . CATARACT EXTRACTION W/PHACO Right 10/25/2016   Procedure: CATARACT EXTRACTION PHACO AND INTRAOCULAR LENS PLACEMENT (IOC)  right eye complicated;  Surgeon: Ronnell Freshwater, MD;  Location: [facility];  Service: Ophthalmology;  Laterality: Right;  Right eye Diabetic - oral meds Vision blue  . FINGER SURGERY    . LOWER EXTREMITY ANGIOGRAPHY Left 08/28/2019   Procedure: LOWER EXTREMITY ANGIOGRAPHY;  Surgeon: Katha Cabal, MD;  Location: [facility];  Service: Cardiovascular;  Laterality: Left;    Social History   Socioeconomic History  . Marital status: Married    Spouse name: unknown  . Number of children: 2  . Years of education:  Not on file  . Highest education level: Not on file  Occupational History  . Occupation: Custodian  Social Needs  . Financial resource strain: Somewhat hard  . Food insecurity    Worry: Never true    Inability: Never true  . Transportation needs    Medical: No    Non-medical: No  Tobacco Use  . Smoking status: Former Smoker    Packs/day: 0.25    Types: Cigarettes    Quit date: 03/23/2015    Years since quitting: 4.4  . Smokeless tobacco: Current User    Types: Chew  Substance and Sexual Activity  . Alcohol use: Not Currently    Alcohol/week: 42.0 standard drinks    Types: 42 Cans of beer per week    Comment: not currently  . Drug use: No  . Sexual activity: Not Currently  Lifestyle  . Physical activity    Days per week: 0 days    Minutes per session: 0 min  . Stress: Not at all  Relationships  . Social Herbalist on phone: Patient refused    Gets together: Patient refused    Attends religious service: Patient refused    Active member of club or organization: Patient refused    Attends meetings of clubs or organizations: Patient refused    Relationship status: Patient refused  . Intimate partner violence    Fear of current or ex partner:  Patient refused    Emotionally abused: Patient refused    Physically abused: Patient refused    Forced sexual activity: Patient refused  Other Topics Concern  . Not on file  Social History Narrative   Lives at home with son    Family History  Problem Relation Age of Onset  . Heart disease Mother   . Alzheimer's disease Sister   . Heart disease Maternal Aunt     Allergies  Allergen Reactions  . Metformin And Related Nausea And Vomiting     Review of Systems   Review of Systems: Negative Unless Checked Constitutional: [] Weight loss  [] Fever  [] Chills Cardiac: [] Chest pain   []  Atrial Fibrillation  [] Palpitations   [] Shortness of breath when laying flat   [] Shortness of breath with exertion. [] Shortness of  breath at rest Vascular:  [] Pain in legs with walking   [] Pain in legs with standing [] Pain in legs when laying flat   [] Claudication    [] Pain in feet when laying flat    [] History of DVT   [] Phlebitis   [] Swelling in legs   [] Varicose veins   [] Non-healing ulcers Pulmonary:   [] Uses home oxygen   [] Productive cough   [] Hemoptysis   [] Wheeze  [x] COPD   [] Asthma Neurologic:  [] Dizziness   [] Seizures  [] Blackouts [] History of stroke   [] History of TIA  [] Aphasia   [] Temporary Blindness   [] Weakness or numbness in arm   [x] Weakness or numbness in leg Musculoskeletal:   [] Joint swelling   [x] Joint pain   [] Low back pain  []  History of Knee Replacement [] Arthritis [] back Surgeries  []  Spinal Stenosis    Hematologic:  [] Easy bruising  [] Easy bleeding   [] Hypercoagulable state   [] Anemic Gastrointestinal:  [] Diarrhea   [] Vomiting  [] Gastroesophageal reflux/heartburn   [] Difficulty swallowing. [] Abdominal pain Genitourinary:  [x] Chronic kidney disease   [] Difficult urination  [] Anuric   [] Blood in urine [] Frequent urination  [] Burning with urination   [] Hematuria Skin:  [] Rashes   [] Ulcers [] Wounds Psychological:  [] History of anxiety   []  History of major depression  []  Memory Difficulties      OBJECTIVE:   Physical Exam  BP (!) 115/55 (BP Location: Left Arm)   Pulse 88   Resp 16   Wt 179 lb (81.2 kg)   BMI 25.68 kg/m   Gen: WD/WN, NAD, appears frail Head: [place]/AT, No temporalis wasting.  Ear/Nose/Throat: Hearing grossly intact, nares w/o erythema or drainage Eyes: PER, EOMI, sclera nonicteric.  Neck: Supple, no masses.  No JVD.  Pulmonary:  Good air movement, no use of accessory muscles.  Cardiac: RRR Vascular:  Vessel Right Left  Radial Palpable Palpable  Dorsalis Pedis  not palpable  not palpable  Posterior Tibial  not palpable  not palpable   Gastrointestinal: soft, non-distended. No guarding/no peritoneal signs.  Musculoskeletal: M/S 5/5 throughout.  No deformity or atrophy.   Neurologic: Pain and light touch intact in extremities.  Symmetrical.  Speech is fluent. Motor exam as listed above. Psychiatric: Judgment intact, Mood & affect appropriate for pt's clinical situation. Dermatologic: No Venous rashes. No Ulcers Noted.  No changes consistent with cellulitis. Lymph : No Cervical lymphadenopathy, no lichenification or skin changes of chronic lymphedema.       ASSESSMENT AND PLAN:  1. Atherosclerosis of native arteries of the extremities with ulceration ([place])  Recommend:  The patient has evidence of severe atherosclerotic changes of both lower extremities associated with ulceration and tissue loss of the foot.  This represents a  limb threatening ischemia and places the patient at the risk for limb loss.  Patient should undergo angiography of the lower extremities with the hope for intervention for limb salvage.  The risks and benefits as well as the alternative therapies was discussed in detail with the patient.  All questions were answered.  Patient agrees to proceed with angiography.  Based on the angiogram we will plan to have the patient brought back for angiogram using pedal access in addition to retrograde access.  Discussed this with the patient and son and they are aware and agree with the plan.  The patient will follow up with me in the office after the procedure.    2. Essential hypertension Continue antihypertensive medications as already ordered, these medications have been reviewed and there are no changes at this time.   3. Benign hypertensive renal disease Currently blood pressure is under control.  We will continue monitoring with PCP    Current Outpatient Medications on File Prior to Visit  Medication Sig Dispense Refill  . acetaminophen (TYLENOL) 500 MG tablet Take 1,000 mg by mouth daily as needed for mild pain or moderate pain.     Marland Kitchen amitriptyline (ELAVIL) 100 MG tablet Take 1 tablet (100 mg total) by mouth at bedtime. 30 tablet 3  .  Benzocaine (ORAL GEL ANESTHETIC MT) Use as directed 1 tablet in the mouth or throat daily as needed (Toothache).    Marland Kitchen levothyroxine (SYNTHROID) 25 MCG tablet Take 1 tablet (25 mcg total) by mouth daily before breakfast. 90 tablet 3  . Multiple Vitamins-Minerals (MULTIVITAMIN WITH MINERALS) tablet Take 1 tablet by mouth daily.    Marland Kitchen OVER THE COUNTER MEDICATION Use as directed 1 application in the mouth or throat daily as needed (Toothache). curoxen mouth sore treatment     Current Facility-Administered Medications on File Prior to Visit  Medication Dose Route Frequency Provider Last Rate Last Dose  . cyanocobalamin ((VITAMIN B-12)) injection 1,000 mcg  1,000 mcg Intramuscular Q30 days Johnson, Megan P, DO        There are no Patient Instructions on file for this visit. No follow-ups on file.   Kris Hartmann, NP  This note was completed with Sales executive.  Any errors are purely unintentional.

## 2019-09-03 NOTE — Telephone Encounter (Signed)
Patient was seen today and his angio rescheduled to 09/11/2019 with Dr. Delana Meyer with a 10:00 am arrival time to the [facility]. Patient will do his covid testing on 09/07/2019 before 12:00 pm at the [facility]. Pre-procedure instructions were discussed and handed to the patient and son.

## 2019-09-07 ENCOUNTER — Other Ambulatory Visit
Admission: RE | Admit: 2019-09-07 | Discharge: 2019-09-07 | Disposition: A | Discharge status: Home / Self Care | Payer: Medicare Other | Source: Ambulatory Visit | Attending: Vascular Surgery | Admitting: Vascular Surgery

## 2019-09-07 DIAGNOSIS — Z01812 Encounter for preprocedural laboratory examination: Secondary | ICD-10-CM | POA: Diagnosis not present

## 2019-09-07 DIAGNOSIS — Z20828 Contact with and (suspected) exposure to other viral communicable diseases: Secondary | ICD-10-CM | POA: Diagnosis not present

## 2019-09-07 LAB — SARS CORONAVIRUS 2 (TAT 6-24 HRS): SARS Coronavirus 2: NEGATIVE

## 2019-09-11 ENCOUNTER — Ambulatory Visit
Admission: RE | Admit: 2019-09-11 | Discharge: 2019-09-11 | Disposition: A | Discharge status: Home / Self Care | Payer: Medicare Other | Attending: Vascular Surgery | Admitting: Vascular Surgery

## 2019-09-11 ENCOUNTER — Encounter: Payer: Self-pay | Admitting: *Deleted

## 2019-09-11 ENCOUNTER — Encounter
Admission: RE | Disposition: A | Discharge status: Home / Self Care | Payer: Self-pay | Source: Home / Self Care | Attending: Vascular Surgery

## 2019-09-11 ENCOUNTER — Other Ambulatory Visit (INDEPENDENT_AMBULATORY_CARE_PROVIDER_SITE_OTHER): Payer: Self-pay | Admitting: Nurse Practitioner

## 2019-09-11 ENCOUNTER — Other Ambulatory Visit: Payer: Self-pay

## 2019-09-11 DIAGNOSIS — E11621 Type 2 diabetes mellitus with foot ulcer: Secondary | ICD-10-CM | POA: Diagnosis not present

## 2019-09-11 DIAGNOSIS — Z79890 Hormone replacement therapy (postmenopausal): Secondary | ICD-10-CM | POA: Insufficient documentation

## 2019-09-11 DIAGNOSIS — E039 Hypothyroidism, unspecified: Secondary | ICD-10-CM | POA: Diagnosis not present

## 2019-09-11 DIAGNOSIS — I70245 Atherosclerosis of native arteries of left leg with ulceration of other part of foot: Secondary | ICD-10-CM | POA: Insufficient documentation

## 2019-09-11 DIAGNOSIS — I10 Essential (primary) hypertension: Secondary | ICD-10-CM | POA: Diagnosis not present

## 2019-09-11 DIAGNOSIS — Z79899 Other long term (current) drug therapy: Secondary | ICD-10-CM | POA: Diagnosis not present

## 2019-09-11 DIAGNOSIS — Z87891 Personal history of nicotine dependence: Secondary | ICD-10-CM | POA: Diagnosis not present

## 2019-09-11 DIAGNOSIS — E1140 Type 2 diabetes mellitus with diabetic neuropathy, unspecified: Secondary | ICD-10-CM | POA: Diagnosis not present

## 2019-09-11 DIAGNOSIS — E1151 Type 2 diabetes mellitus with diabetic peripheral angiopathy without gangrene: Secondary | ICD-10-CM | POA: Diagnosis not present

## 2019-09-11 DIAGNOSIS — L97909 Non-pressure chronic ulcer of unspecified part of unspecified lower leg with unspecified severity: Secondary | ICD-10-CM

## 2019-09-11 DIAGNOSIS — L97529 Non-pressure chronic ulcer of other part of left foot with unspecified severity: Secondary | ICD-10-CM | POA: Diagnosis not present

## 2019-09-11 DIAGNOSIS — I70299 Other atherosclerosis of native arteries of extremities, unspecified extremity: Secondary | ICD-10-CM

## 2019-09-11 HISTORY — PX: LOWER EXTREMITY ANGIOGRAPHY: CATH118251

## 2019-09-11 LAB — BUN: BUN: 10 mg/dL (ref 8–23)

## 2019-09-11 LAB — CREATININE, SERUM
Creatinine, Ser: 0.8 mg/dL (ref 0.61–1.24)
GFR calc Af Amer: >60 mL/min (ref >60)
GFR calc non Af Amer: >60 mL/min (ref >60)

## 2019-09-11 SURGERY — LOWER EXTREMITY ANGIOGRAPHY
Anesthesia: Moderate Sedation | Site: Leg Lower | Laterality: Left

## 2019-09-11 MED ORDER — MIDAZOLAM HCL 2 MG/ML PO SYRP: 8.0000 mg | ORAL_SOLUTION | Freq: Once | ORAL | Status: DC | PRN

## 2019-09-11 MED ORDER — HYDROMORPHONE HCL 1 MG/ML IJ SOLN: 1.0000 mg | Freq: Once | INTRAMUSCULAR | Status: DC | PRN

## 2019-09-11 MED ORDER — ACETAMINOPHEN 325 MG PO TABS: 650.0000 mg | ORAL_TABLET | ORAL | Status: DC | PRN

## 2019-09-11 MED ORDER — FENTANYL CITRATE (PF) 100 MCG/2ML IJ SOLN
INTRAMUSCULAR | Status: AC
  Filled 2019-09-11: qty 2

## 2019-09-11 MED ORDER — HEPARIN SODIUM (PORCINE) 1000 UNIT/ML IJ SOLN
INTRAMUSCULAR | Status: DC | PRN
  Administered 2019-09-11: 5000 [IU] via INTRAVENOUS

## 2019-09-11 MED ORDER — CLOPIDOGREL BISULFATE 75 MG PO TABS
ORAL_TABLET | ORAL | Status: AC
  Administered 2019-09-11: 300 mg via ORAL
  Filled 2019-09-11: qty 3

## 2019-09-11 MED ORDER — HEPARIN SODIUM (PORCINE) 1000 UNIT/ML IJ SOLN
INTRAMUSCULAR | Status: AC
  Filled 2019-09-11: qty 1

## 2019-09-11 MED ORDER — MORPHINE SULFATE (PF) 4 MG/ML IV SOLN: 2.0000 mg | INTRAVENOUS | Status: DC | PRN

## 2019-09-11 MED ORDER — METHYLPREDNISOLONE SODIUM SUCC 125 MG IJ SOLR: 125.0000 mg | Freq: Once | INTRAMUSCULAR | Status: DC | PRN

## 2019-09-11 MED ORDER — ATORVASTATIN CALCIUM 10 MG PO TABS: 10.0000 mg | ORAL_TABLET | Freq: Every day | ORAL | 1 refills | Status: DC

## 2019-09-11 MED ORDER — SODIUM CHLORIDE 0.9 % IV SOLN
INTRAVENOUS | Status: DC
  Administered 2019-09-11: 10:00:00 via INTRAVENOUS

## 2019-09-11 MED ORDER — SODIUM CHLORIDE 0.9 % IV SOLN: 250.0000 mL | INTRAVENOUS | Status: DC | PRN

## 2019-09-11 MED ORDER — CLOPIDOGREL BISULFATE 75 MG PO TABS
ORAL_TABLET | ORAL | Status: AC
  Filled 2019-09-11: qty 1

## 2019-09-11 MED ORDER — MIDAZOLAM HCL 2 MG/2ML IJ SOLN
INTRAMUSCULAR | Status: DC | PRN
  Administered 2019-09-11 (×3): 1 mg via INTRAVENOUS

## 2019-09-11 MED ORDER — SODIUM CHLORIDE 0.9 % IV SOLN: INTRAVENOUS | Status: DC

## 2019-09-11 MED ORDER — CLOPIDOGREL BISULFATE 300 MG PO TABS
300.0000 mg | ORAL_TABLET | ORAL | Status: AC
  Administered 2019-09-11: 15:00:00 300 mg via ORAL

## 2019-09-11 MED ORDER — CEFAZOLIN SODIUM-DEXTROSE 2-4 GM/100ML-% IV SOLN
2.0000 g | Freq: Once | INTRAVENOUS | Status: AC
  Administered 2019-09-11: 2 g via INTRAVENOUS

## 2019-09-11 MED ORDER — HYDRALAZINE HCL 20 MG/ML IJ SOLN: 5.0000 mg | INTRAMUSCULAR | Status: DC | PRN

## 2019-09-11 MED ORDER — SODIUM CHLORIDE 0.9% FLUSH: 3.0000 mL | INTRAVENOUS | Status: DC | PRN

## 2019-09-11 MED ORDER — LABETALOL HCL 5 MG/ML IV SOLN: 10.0000 mg | INTRAVENOUS | Status: DC | PRN

## 2019-09-11 MED ORDER — DIPHENHYDRAMINE HCL 50 MG/ML IJ SOLN: 50.0000 mg | Freq: Once | INTRAMUSCULAR | Status: DC | PRN

## 2019-09-11 MED ORDER — SODIUM CHLORIDE 0.9% FLUSH: 3.0000 mL | Freq: Two times a day (BID) | INTRAVENOUS | Status: DC

## 2019-09-11 MED ORDER — OXYCODONE HCL 5 MG PO TABS: 5.0000 mg | ORAL_TABLET | ORAL | Status: DC | PRN

## 2019-09-11 MED ORDER — CLOPIDOGREL BISULFATE 75 MG PO TABS: 75.0000 mg | ORAL_TABLET | Freq: Every day | ORAL | 4 refills | Status: DC

## 2019-09-11 MED ORDER — FAMOTIDINE 20 MG PO TABS: 40.0000 mg | ORAL_TABLET | Freq: Once | ORAL | Status: DC | PRN

## 2019-09-11 MED ORDER — MIDAZOLAM HCL 5 MG/5ML IJ SOLN
INTRAMUSCULAR | Status: AC
  Filled 2019-09-11: qty 5

## 2019-09-11 MED ORDER — ONDANSETRON HCL 4 MG/2ML IJ SOLN: 4.0000 mg | Freq: Four times a day (QID) | INTRAMUSCULAR | Status: DC | PRN

## 2019-09-11 MED ORDER — FENTANYL CITRATE (PF) 100 MCG/2ML IJ SOLN
INTRAMUSCULAR | Status: DC | PRN
  Administered 2019-09-11: 50 ug via INTRAVENOUS
  Administered 2019-09-11: 25 ug via INTRAVENOUS
  Administered 2019-09-11: 50 ug via INTRAVENOUS

## 2019-09-11 SURGICAL SUPPLY — 32 items
BALLN DORADO 4X200X135 (BALLOONS) ×3
BALLN LUTONIX 6X150X130 (BALLOONS) ×3
BALLN LUTONIX 6X220X130 (BALLOONS) ×3
BALLN LUTONIX DCB 4X40X130 (BALLOONS) ×3
BALLN ULTRVRSE 3X40X130C (BALLOONS) ×3
BALLOON DORADO 4X200X135 (BALLOONS) ×1 IMPLANT
BALLOON LUTONIX 6X150X130 (BALLOONS) ×1 IMPLANT
BALLOON LUTONIX 6X220X130 (BALLOONS) ×1 IMPLANT
BALLOON LUTONIX DCB 4X40X130 (BALLOONS) ×1 IMPLANT
BALLOON ULTRVRSE 3X40X130C (BALLOONS) ×1 IMPLANT
CATH BEACON 5 .035 65 KMP TIP (CATHETERS) ×3 IMPLANT
CATH BEACON 5 .035 65 RIM TIP (CATHETERS) ×3 IMPLANT
CATH SEEKER .035X135CM (CATHETERS) ×3 IMPLANT
CATH VERT 5FR 125CM (CATHETERS) ×3 IMPLANT
COVER PROBE U/S 5X48 (MISCELLANEOUS) ×3 IMPLANT
DEVICE PRESTO INFLATION (MISCELLANEOUS) ×3 IMPLANT
DEVICE RAD TR BAND REGULAR (VASCULAR PRODUCTS) ×3 IMPLANT
DEVICE STARCLOSE SE CLOSURE (Vascular Products) ×3 IMPLANT
DEVICE TORQUE .025-.038 (MISCELLANEOUS) ×3 IMPLANT
DRAPE INCISE IOBAN 66X45 STRL (DRAPES) ×3 IMPLANT
DRAPE TABLE BACK 80X90 (DRAPES) ×3 IMPLANT
GLIDECATH ANGLED 4FR 120CM (CATHETERS) ×3 IMPLANT
GLIDEWIRE ADV .035X260CM (WIRE) ×3 IMPLANT
GUIDEWIRE PFTE-COATED .018X300 (WIRE) ×3 IMPLANT
LIFESTENT SOLO 7X200X135 (Permanent Stent) ×3 IMPLANT
NEEDLE ENTRY 21GA 7CM ECHOTIP (NEEDLE) ×3 IMPLANT
PACK ANGIOGRAPHY (CUSTOM PROCEDURE TRAY) ×3 IMPLANT
SHEATH BRITE TIP 5FRX11 (SHEATH) ×3 IMPLANT
SHEATH FLEXOR ANSEL2 7FRX45 (SHEATH) ×3 IMPLANT
SHEATH HALO 035 5FRX10 (SHEATH) ×3 IMPLANT
SHEATH MICROPUNCTURE PEDAL 4FR (SHEATH) ×3 IMPLANT
STENT LIFESTENT 7X150X130 (Permanent Stent) ×3 IMPLANT

## 2019-09-11 NOTE — Discharge Instructions (Signed)
Take low dose (81 mg) aspirin daily, with lipitor and plavix. Starting tomorrow.    Angiogram, Care After This sheet gives you information about how to care for yourself after your procedure. Your health care provider may also give you more specific instructions. If you have problems or questions, contact your health care provider. What can I expect after the procedure? After the procedure, it is common to have bruising and tenderness at the catheter insertion area. Follow these instructions at home: Insertion site care  Follow instructions from your health care provider about how to take care of your insertion site. Make sure you: ? Wash your hands with soap and water before you change your bandage (dressing). If soap and water are not available, use hand sanitizer. ? Change your dressing as told by your health care provider. ? Leave stitches (sutures), skin glue, or adhesive strips in place. These skin closures may need to stay in place for 2 weeks or longer. If adhesive strip edges start to loosen and curl up, you may trim the loose edges. Do not remove adhesive strips completely unless your health care provider tells you to do that.  Do not take baths, swim, or use a hot tub until your health care provider approves.  You may shower 24-48 hours after the procedure or as told by your health care provider. ? Gently wash the site with plain soap and water. ? Pat the area dry with a clean towel. ? Do not rub the site. This may cause bleeding.  Do not apply powder or lotion to the site. Keep the site clean and dry.  Check your insertion site every day for signs of infection. Check for: ? Redness, swelling, or pain. ? Fluid or blood. ? Warmth. ? Pus or a bad smell. Activity  Rest as told by your health care provider, usually for 1-2 days.  Do not lift anything that is heavier than 10 lbs. (4.5 kg) or as told by your health care provider.  Do not drive for 24 hours if you were given a  medicine to help you relax (sedative).  Do not drive or use heavy machinery while taking prescription pain medicine. General instructions   Return to your normal activities as told by your health care provider, usually in about a week. Ask your health care provider what activities are safe for you.  If the catheter site starts bleeding, lie flat and put pressure on the site. If the bleeding does not stop, get help right away. This is a medical emergency.  Drink enough fluid to keep your urine clear or pale yellow. This helps flush the contrast dye from your body.  Take over-the-counter and prescription medicines only as told by your health care provider.  Keep all follow-up visits as told by your health care provider. This is important. Contact a health care provider if:  You have a fever or chills.  You have redness, swelling, or pain around your insertion site.  You have fluid or blood coming from your insertion site.  The insertion site feels warm to the touch.  You have pus or a bad smell coming from your insertion site.  You have bruising around the insertion site.  You notice blood collecting in the tissue around the catheter site (hematoma). The hematoma may be painful to the touch. Get help right away if:  You have severe pain at the catheter insertion area.  The catheter insertion area swells very fast.  The catheter insertion area  is bleeding, and the bleeding does not stop when you hold steady pressure on the area.  The area near or just beyond the catheter insertion site becomes pale, cool, tingly, or numb. These symptoms may represent a serious problem that is an emergency. Do not wait to see if the symptoms will go away. Get medical help right away. Call your local emergency services (911 in the [place] Do not drive yourself to the hospital. Summary  After the procedure, it is common to have bruising and tenderness at the catheter insertion area.  After the  procedure, it is important to rest and drink plenty of fluids.  Do not take baths, swim, or use a hot tub until your health care provider says it is okay to do so. You may shower 24-48 hours after the procedure or as told by your health care provider.  If the catheter site starts bleeding, lie flat and put pressure on the site. If the bleeding does not stop, get help right away. This is a medical emergency. This information is not intended to replace advice given to you by your health care provider. Make sure you discuss any questions you have with your health care provider. Document Released: 04/15/2005 Document Revised: 09/09/2017 Document Reviewed: 09/01/2016 Elsevier Patient Education  2020 [employer].

## 2019-09-11 NOTE — H&P (Signed)
[facility] History & Physical Update  The patient was interviewed and re-examined.  The patient's previous History and Physical has been reviewed and is unchanged.  There is no change in the plan of care. We plan to proceed with the scheduled procedure.  Hortencia Pilar, MD  09/11/2019, 11:17 AM

## 2019-09-11 NOTE — Op Note (Signed)
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  Introduction catheter into left lower extremity 3rd order catheter placement  2. Contrast injection left lower extremity for distal runoff  3.  Ultrasound-guided access to the right common femoral artery as well as the left dorsalis pedis artery             4.   Percutaneous transluminal angioplasty and stent placement left superficial femoral and popliteal arteries to 6 mm 4. Percutaneous transluminal angioplasty left peroneal  5. Star close closure right common femoral arteriotomy  Anesthesia: Conscious sedation was administered under my direct supervision by the interventional radiology RN. IV Versed plus fentanyl were utilized. Continuous ECG, pulse oximetry and blood pressure was monitored throughout the entire procedure.  Conscious sedation was for a total of 90 minutes.  Sheath: 7 [place] Ansell right common femoral retrograde; 5 French halo left dorsalis pedis retrograde  Contrast: 130 cc  Fluoroscopy Time: 21.8 minutes  Indications: [name] presents with increasing pain of the left lower extremity.  Patient has an ulceration.  He recently underwent attempted revascularization but I was unable to cross the SFA occlusion.  Given this I have prepared for both femoral as well as pedal access.  The ulceration is nonhealing and this suggests the patient is having limb threatening ischemia. The risks and benefits are reviewed all questions answered patient agrees to proceed.  Procedure:[name] is a 78 y.o. y.o. male who was identified and appropriate procedural time out was  performed. The patient was then placed supine on the table and prepped and draped in the usual sterile fashion.   Ultrasound was placed in the sterile sleeve and the right groin was evaluated the right common femoral artery was echolucent and pulsatile indicating patency. Image was recorded for the permanent record and under real-time visualization a microneedle was inserted into the common femoral artery followed by the microwire and then the micro-sheath. A J-wire was then advanced through the micro-sheath and a 5 [place] sheath was then inserted over a J-wire. J-wire was then advanced and a 5 French pigtail catheter was positioned at the level to above the bifurcation and a LAO view of the pelvis was obtained. Subsequently a rim catheter with the stiff angle Glidewire was used to cross the aortic bifurcation the catheter wire were advanced down into the right distal external iliac artery. Oblique view of the femoral bifurcation was then obtained and subsequently the wire was reintroduced and the pigtail catheter negotiated into the SFA representing third order catheter placement. Distal runoff was then performed.  5000 units of heparin was then given and allowed to circulate and a 7 [place] Ansell sheath was advanced up and over the bifurcation and positioned in the femoral artery  Straight catheter and stiff angle Glidewire were then negotiated down into the mid SFA. Catheter was then advanced. Hand injection contrast demonstrated the tibial anatomy in detail.  Multiple attempts at crossing the distal SFA and popliteal occlusive disease was then made using a variety of different wires and catheters.  However, ultimately this was unsuccessful and I made preparations for pedal access.  Ultrasound was returned to the field in a sterile sleeve.  The proximal dorsalis pedis artery was identified by ultrasound.  1% lidocaine was infiltrated in the soft tissues.  A  micropuncture needle was then used to  access the proximal dorsalis pedis artery.  Image was recorded for the permanent record.  Microwire followed by a micro-sheath was then inserted.  A 5 French halo sheath was then inserted.  Using a combination of a Kumpe catheter with a 0.018 advantage wire, the wire catheter combination was then advanced in a retrograde fashion through the tibial and negotiated into first the popliteal and then advanced into the superficial femoral.  Hand-injection of contrast through the Kumpe catheter verified that we were intraluminal.  A 9 mm X 6 mm EZ snare was then introduced into the superficial femoral from the right femoral sheath and the 0.018 wire captured and pulled extracorporeally.  Once enough wire had been fed a Kumpe catheter was then advanced in an antegrade direction down across the femoral-popliteal disease and positioned with its tip in the distal popliteal artery.  Hand-injection of contrast confirmed intraluminal positioning.  The 0.035 advantage wire was then advanced through the Kumpe catheter and negotiated into the peroneal artery across the 90% stenosis.  4 mm x 200 mm [place] balloon was used to angioplasty the SFA and popliteal arteries.  Each inflation was for 1 minute at 12 to 18 atm. Follow-up imaging demonstrated patency of the SFA and popliteal but with high-grade greater than 60% residual stenosis at multiple locations throughout the artery.  I therefore selected a 7 mm x 200 mm life stent followed by a 7 mm x 150 mm life stent these were deployed from the origin of the SFA down to the level of the femoral condyles (almost to the mid popliteal).  These life stents were then postdilated with a 6 mm Lutonix drug-eluting balloons inflations were to 12 to 14 atm for 1 minute each.  Follow-up imaging demonstrated wide patency of the SFA and popliteal with less than 10% residual stenosis.  Attention is then turned to the 90% stenosis in the peroneal.  Having crossed the lesion with the advantage  wire I advanced a 3 mm x 40 mm Ultraverse balloon inflation was to 14 atm for 1 minute.  Follow-up imaging demonstrated significant residual stenosis and I selected a 4 mm x 40 mm Lutonix drug-eluting balloon advanced it across the peroneal lesion and inflated it to 12 atm for 1 minute.  Follow-up imaging now demonstrated wide patency with less than 10% residual stenosis and preservation of the distal peroneal runoff.  Anterior tibial runoff is preserved as well.  After review of these images the sheath is pulled into the right external iliac oblique of the common femoral is obtained and a Star close device deployed.  The pedal sheath is pulled pressure is held and a TR band is applied.  There no immediate Complications.  Findings:  The abdominal aorta and aortic bifurcation is widely patent which was demonstrated the previous recent angiogram.  The left common and external iliac arteries are widely patent.  The left common femoral is widely patent as is the profunda femoris.  The SFA does indeed have a significant stenosis beginning at its origin there are multiple lesions of greater than 60% some greater than 90% ultimately only the mid SFA occludes the popliteal artery is reconstituted at the level of the femoral condyles.  The distal popliteal demonstrates moderate disease and the trifurcation is heavily diseased with occlusion of the posterior tibial and a 90% stenosis in the proximal peroneal.    After successfully crossing the lesion from a combination of the right femoral and left  pedal access angioplasty is performed of the SFA and popliteal arteries.  High-grade residual stenosis is identified and life stents are deployed and postdilated to 6 mm.  There is now less than 10% residual stenosis throughout the SFA popliteal system.  Following angioplasty to 4 mm the peroneal now is in-line flow and looks quite nice.   Summary: Successful recanalization left lower extremity for limb  salvage   Disposition: Patient was taken to the recovery room in stable condition having tolerated the procedure well.  [name] 09/11/2019,2:00 PM

## 2019-09-12 ENCOUNTER — Encounter: Payer: Self-pay | Admitting: Vascular Surgery

## 2019-09-14 ENCOUNTER — Telehealth: Payer: Self-pay

## 2019-09-25 ENCOUNTER — Telehealth: Payer: Self-pay

## 2019-09-27 ENCOUNTER — Other Ambulatory Visit (INDEPENDENT_AMBULATORY_CARE_PROVIDER_SITE_OTHER): Payer: Self-pay | Admitting: Vascular Surgery

## 2019-09-27 DIAGNOSIS — I70249 Atherosclerosis of native arteries of left leg with ulceration of unspecified site: Secondary | ICD-10-CM

## 2019-09-27 DIAGNOSIS — Z95820 Peripheral vascular angioplasty status with implants and grafts: Secondary | ICD-10-CM

## 2019-10-01 ENCOUNTER — Encounter (INDEPENDENT_AMBULATORY_CARE_PROVIDER_SITE_OTHER): Payer: Self-pay | Admitting: Nurse Practitioner

## 2019-10-01 ENCOUNTER — Ambulatory Visit (INDEPENDENT_AMBULATORY_CARE_PROVIDER_SITE_OTHER): Payer: Medicare Other

## 2019-10-01 ENCOUNTER — Other Ambulatory Visit: Payer: Self-pay

## 2019-10-01 ENCOUNTER — Ambulatory Visit (INDEPENDENT_AMBULATORY_CARE_PROVIDER_SITE_OTHER): Payer: Medicare Other | Admitting: Nurse Practitioner

## 2019-10-01 VITALS — BP 132/62 | HR 89 | Resp 16 | Ht 67.0 in | Wt 179.0 lb

## 2019-10-01 DIAGNOSIS — I7025 Atherosclerosis of native arteries of other extremities with ulceration: Secondary | ICD-10-CM | POA: Diagnosis not present

## 2019-10-01 DIAGNOSIS — Z95820 Peripheral vascular angioplasty status with implants and grafts: Secondary | ICD-10-CM

## 2019-10-01 DIAGNOSIS — I70249 Atherosclerosis of native arteries of left leg with ulceration of unspecified site: Secondary | ICD-10-CM | POA: Diagnosis not present

## 2019-10-01 DIAGNOSIS — I10 Essential (primary) hypertension: Secondary | ICD-10-CM

## 2019-10-07 ENCOUNTER — Encounter (INDEPENDENT_AMBULATORY_CARE_PROVIDER_SITE_OTHER): Payer: Self-pay | Admitting: Nurse Practitioner

## 2019-10-07 NOTE — Progress Notes (Signed)
SUBJECTIVE:  Patient ID: [name], male    DOB: 03-30-1941, 78 y.o.   MRN: [id number] Chief Complaint  Patient presents with  . Follow-up    ultrasound    HPI  [name] is a 78 y.o. male The patient returns to the office for followup and review status post angiogram with intervention.  The patient underwent left lower extremity angiogram on 09/11/2019.  The patient notes improvement in the left lower extremity symptoms. No interval shortening of the patient's claudication distance or rest pain symptoms. Previous wounds have now healed.  No new ulcers or wounds have occurred since the last visit.  There have been no significant changes to the patient's overall health care.  The patient denies amaurosis fugax or recent TIA symptoms. There are no recent neurological changes noted. The patient denies history of DVT, PE or superficial thrombophlebitis. The patient denies recent episodes of angina or shortness of breath.   ABI's Rt=0.86 and Lt=1.06  (previous ABI's Rt=0.87 and Lt=0.66) Duplex US of the left lower extremity arterial system shows triphasic/biphasic tibial artery waveforms in the right lower extremity shows biphasic/monophasic waveforms.  The patient has good toe waveforms bilaterally.  Past Medical History:  Diagnosis Date  . Arthritis   . 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 Diabetes mellitus   . Dyspnea   . Goals of care, counseling/discussion 09/08/2017  . 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 Hypertension   . Hypothyroidism   . Neuropathy   . Peripheral vascular disease ([place])   . 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 Venous stasis     Past Surgical History:  Procedure Laterality Date  . CATARACT EXTRACTION W/PHACO Right 10/25/2016   Procedure: CATARACT EXTRACTION PHACO AND INTRAOCULAR LENS PLACEMENT ([facility])  right eye complicated;  Surgeon: Ronnell Freshwater, MD;  Location: [facility];  Service: Ophthalmology;  Laterality: Right;  Right eye Diabetic - oral meds Vision blue  . FINGER SURGERY    . LOWER EXTREMITY ANGIOGRAPHY Left 08/28/2019   Procedure: LOWER EXTREMITY ANGIOGRAPHY;  Surgeon: Katha Cabal, MD;  Location: [facility];  Service: Cardiovascular;  Laterality: Left;  . LOWER EXTREMITY ANGIOGRAPHY Left 09/11/2019   Procedure: LOWER EXTREMITY ANGIOGRAPHY;  Surgeon: Katha Cabal, MD;  Location: [facility];  Service: Cardiovascular;  Laterality: Left;    Social History   Socioeconomic History  . Marital status: Married    Spouse name: unknown  . Number of children: 2  . Years of education: Not on file  . Highest education level: Not on file  Occupational History  . Occupation: Custodian  Tobacco Use  . Smoking status: Former Smoker    Packs/day: 0.25    Types: Cigarettes    Quit date: 03/23/2015    Years since quitting: 4.5  . Smokeless tobacco: Current User    Types: Chew  Substance and Sexual Activity  . Alcohol use: Not Currently    Alcohol/week: 42.0 standard drinks    Types: 42 Cans of beer per week    Comment: not currently  . Drug use: No  . Sexual activity: Not Currently  Other Topics Concern  . Not on file  Social History Narrative   Lives at home with son   Social Determinants of Health   Financial Resource Strain: Medium Risk  . Difficulty of Paying Living Expenses: Somewhat hard  Food Insecurity: No  Food Insecurity  . Worried About Charity fundraiser in the Last Year: Never true  . Ran Out of Food in the Last Year: Never true  Transportation Needs: No Transportation Needs  . Lack of Transportation (Medical): No  . Lack of Transportation (Non-Medical): No  Physical Activity: Inactive  . Days of Exercise per Week: 0 days  . Minutes of Exercise per Session: 0 min  Stress: No Stress Concern Present  . Feeling of Stress : Not at all  Social Connections:   . Frequency of Communication  with Friends and Family: Not on file  . Frequency of Social Gatherings with Friends and Family: Not on file  . Attends Religious Services: Not on file  . Active Member of Clubs or Organizations: Not on file  . Attends Archivist Meetings: Not on file  . Marital Status: Not on file  Intimate Partner Violence:   . Fear of Current or Ex-Partner: Not on file  . Emotionally Abused: Not on file  . Physically Abused: Not on file  . Sexually Abused: Not on file    Family History  Problem Relation Age of Onset  . Heart disease Mother   . Alzheimer's disease Sister   . Heart disease Maternal Aunt     Allergies  Allergen Reactions  . Metformin And Related Nausea And Vomiting     Review of Systems   Review of Systems: Negative Unless Checked Constitutional: [] Weight loss  [] Fever  [] Chills Cardiac: [] Chest pain   []  Atrial Fibrillation  [] Palpitations   [] Shortness of breath when laying flat   [x] Shortness of breath with exertion. [] Shortness of breath at rest Vascular:  [] Pain in legs with walking   [] Pain in legs with standing [] Pain in legs when laying flat   [x] Claudication    [] Pain in feet when laying flat    [] History of DVT   [] Phlebitis   [] Swelling in legs   [] Varicose veins   [] Non-healing ulcers Pulmonary:   [] Uses home oxygen   [] Productive cough   [] Hemoptysis   [] Wheeze  [] COPD   [] Asthma Neurologic:  [] Dizziness   [] Seizures  [] Blackouts [] History of stroke   [] History of TIA  [] Aphasia   [] Temporary Blindness   [] Weakness or numbness in arm   [] Weakness or numbness in leg Musculoskeletal:   [] Joint swelling   [] Joint pain   [] Low back pain  []  History of Knee Replacement [x] Arthritis [] back Surgeries  []  Spinal Stenosis    Hematologic:  [] Easy bruising  [] Easy bleeding   [] Hypercoagulable state   [] Anemic Gastrointestinal:  [] Diarrhea   [] Vomiting  [] Gastroesophageal reflux/heartburn   [] Difficulty swallowing. [] Abdominal pain Genitourinary:  [] Chronic kidney  disease   [] Difficult urination  [] Anuric   [] Blood in urine [] Frequent urination  [] Burning with urination   [] Hematuria Skin:  [] Rashes   [] Ulcers [] Wounds Psychological:  [] History of anxiety   []  History of major depression  []  Memory Difficulties      OBJECTIVE:   Physical Exam  BP 132/62 (BP Location: Right Arm)   Pulse 89   Resp 16   Ht 5\' 7"  (1.702 m)   Wt 179 lb (81.2 kg)   BMI 28.04 kg/m   Gen: WD/WN, NAD Head: [place]/AT, No temporalis wasting.  Ear/Nose/Throat: Hearing grossly intact, nares w/o erythema or drainage Eyes: PER, EOMI, sclera nonicteric.  Neck: Supple, no masses.  No JVD.  Pulmonary:  Good air movement, no use of accessory muscles.  Cardiac: RRR Vascular:  Vessel Right Left  Radial Palpable Palpable  Dorsalis Pedis Palpable Palpable  Posterior Tibial Palpable Palpable   Gastrointestinal: soft, non-distended. No guarding/no peritoneal signs.  Musculoskeletal: M/S 5/5 throughout.  No deformity or atrophy.  Neurologic: Pain and light touch intact in extremities.  Symmetrical.  Speech is fluent. Motor exam as listed above. Psychiatric: Judgment intact, Mood & affect appropriate for pt's clinical situation. Dermatologic: No Venous rashes. No Ulcers Noted.  No changes consistent with cellulitis. Lymph : No Cervical lymphadenopathy, no lichenification or skin changes of chronic lymphedema.       ASSESSMENT AND PLAN:  1. Atherosclerosis of native arteries of the extremities with ulceration ([place])  Recommend:  The patient has evidence of atherosclerosis of the lower extremities with claudication.  The patient does not voice lifestyle limiting changes at this point in time.  Noninvasive studies do not suggest clinically significant change.  No invasive studies, angiography or surgery at this time The patient should continue walking and begin a more formal exercise program.  The patient should continue antiplatelet therapy and aggressive treatment of the  lipid abnormalities  No changes in the patient's medications at this time  The patient should continue wearing graduated compression socks 10-15 mmHg strength to control the mild edema.   Patient will follow up in 3 months with noninvasive studies.  2. Essential hypertension Continue antihypertensive medications as already ordered, these medications have been reviewed and there are no changes at this time.    Current Outpatient Medications on File Prior to Visit  Medication Sig Dispense Refill  . acetaminophen (TYLENOL) 500 MG tablet Take 1,000 mg by mouth daily as needed for mild pain or moderate pain.     Marland Kitchen amitriptyline (ELAVIL) 100 MG tablet Take 1 tablet (100 mg total) by mouth at bedtime. 30 tablet 3  . atorvastatin (LIPITOR) 10 MG tablet Take 1 tablet (10 mg total) by mouth daily. Additional refills per the patient's primary medical provider 30 tablet 1  . clopidogrel (PLAVIX) 75 MG tablet Take 1 tablet (75 mg total) by mouth daily. 30 tablet 4  . ibuprofen (ADVIL) 100 MG tablet Take 100 mg by mouth 3 (three) times daily.    Marland Kitchen levothyroxine (SYNTHROID) 25 MCG tablet Take 1 tablet (25 mcg total) by mouth daily before breakfast. 90 tablet 3  . Multiple Vitamins-Minerals (MULTIVITAMIN WITH MINERALS) tablet Take 1 tablet by mouth daily.    Marland Kitchen OVER THE COUNTER MEDICATION Use as directed 1 application in the mouth or throat daily as needed (Toothache). curoxen mouth sore treatment    . Benzocaine (ORAL GEL ANESTHETIC MT) Use as directed 1 tablet in the mouth or throat daily as needed (Toothache).     Current Facility-Administered Medications on File Prior to Visit  Medication Dose Route Frequency Provider Last Rate Last Admin  . cyanocobalamin ((VITAMIN B-12)) injection 1,000 mcg  1,000 mcg Intramuscular Q30 days Johnson, Megan P, DO        There are no Patient Instructions on file for this visit. No follow-ups on file.   Kris Hartmann, NP  This note was completed with Production manager.  Any errors are purely unintentional.

## 2019-10-19 ENCOUNTER — Telehealth: Payer: Self-pay

## 2019-10-19 ENCOUNTER — Ambulatory Visit: Payer: Self-pay | Admitting: *Deleted

## 2019-10-19 NOTE — Chronic Care Management (AMB) (Signed)
  Chronic Care Management   Outreach Note  10/19/2019 Name: [name] MRN: [id number] DOB: 10-09-1941  Referred by: Valerie Roys, DO Reason for referral : Chronic Care Management (Unsuccessful Outreach)   An unsuccessful telephone outreach was attempted today. The patient was referred to the case management team by for assistance with care management and care coordination.  Made patient aware on voicemail we did receive some chocolate ensure for him if he wanted to come by and get it next week.   Follow Up Plan: A HIPPA compliant phone message was left for the patient providing contact information and requesting a return call.  The care management team will reach out to the patient again over the next 60 days.   Merlene Morse Hattye Siegfried RN, BSN Nurse Case Editor, [facility]  [phone]) Business Mobile

## 2019-10-23 ENCOUNTER — Ambulatory Visit: Payer: Self-pay | Admitting: Pharmacist

## 2019-10-23 NOTE — Chronic Care Management (AMB) (Signed)
  Chronic Care Management   Note  10/23/2019 Name: [name] MRN: [id number] DOB: 21-Sep-1941  [name] is a 79 y.o. year old male who is a primary care patient of Valerie Roys, DO. The CCM team was consulted for assistance with chronic disease management and care coordination needs.    Was asked by Merlene Morse Minor, RN CM, to prepare a sample of chocolate Ensure and leave at the front desk for patient. I have done so. Contacted patient, left him know of the above. He expressed understanding.  Catie Darnelle Maffucci, PharmD, [place] [phone]

## 2019-11-11 ENCOUNTER — Other Ambulatory Visit (INDEPENDENT_AMBULATORY_CARE_PROVIDER_SITE_OTHER): Payer: Self-pay | Admitting: Vascular Surgery

## 2019-11-13 ENCOUNTER — Telehealth: Payer: Self-pay

## 2019-11-13 ENCOUNTER — Other Ambulatory Visit: Payer: Self-pay | Admitting: Family Medicine

## 2019-11-13 NOTE — Telephone Encounter (Signed)
Called [facility] to enquire about Levothyroxine. Was informed that prescription is ready for pick up.  Called and spoke with pt's son and informed him of prescription status.  Son is requesting help with getting pt fitted for dentures. Informed pt's son that will notify office. Pt's son verbalized understanding.

## 2019-11-13 NOTE — Telephone Encounter (Signed)
Medication Refill - Medication: levothyroxine  Has the patient contacted their pharmacy? Yes.   Pt states he is completely out of this medication. Please advise.  (Agent: If no, request that the patient contact the pharmacy for the refill.) (Agent: If yes, when and what did the pharmacy advise?)  Preferred Pharmacy (with phone number or street name):  [place], [place] - [address]  [place] [address]  Phone: [phone] Fax: [phone]  Not a 24 hour pharmacy; exact hours not known.     Agent: Please be advised that RX refills may take up to 3 business days. We ask that you follow-up with your pharmacy.

## 2019-11-26 ENCOUNTER — Other Ambulatory Visit: Payer: Self-pay | Admitting: Family Medicine

## 2019-12-27 ENCOUNTER — Other Ambulatory Visit (INDEPENDENT_AMBULATORY_CARE_PROVIDER_SITE_OTHER): Payer: Self-pay | Admitting: Nurse Practitioner

## 2019-12-27 DIAGNOSIS — I739 Peripheral vascular disease, unspecified: Secondary | ICD-10-CM

## 2019-12-31 ENCOUNTER — Ambulatory Visit (INDEPENDENT_AMBULATORY_CARE_PROVIDER_SITE_OTHER): Payer: Medicare Other | Admitting: Vascular Surgery

## 2019-12-31 ENCOUNTER — Encounter (INDEPENDENT_AMBULATORY_CARE_PROVIDER_SITE_OTHER): Payer: Medicare Other

## 2020-01-01 ENCOUNTER — Other Ambulatory Visit: Payer: Self-pay

## 2020-01-01 ENCOUNTER — Ambulatory Visit (INDEPENDENT_AMBULATORY_CARE_PROVIDER_SITE_OTHER): Payer: Medicare Other | Admitting: Nurse Practitioner

## 2020-01-01 ENCOUNTER — Encounter (INDEPENDENT_AMBULATORY_CARE_PROVIDER_SITE_OTHER): Payer: Self-pay | Admitting: Nurse Practitioner

## 2020-01-01 ENCOUNTER — Ambulatory Visit (INDEPENDENT_AMBULATORY_CARE_PROVIDER_SITE_OTHER): Payer: Medicare Other

## 2020-01-01 ENCOUNTER — Telehealth (INDEPENDENT_AMBULATORY_CARE_PROVIDER_SITE_OTHER): Payer: Self-pay

## 2020-01-01 VITALS — BP 111/50 | HR 92 | Resp 18 | Wt 179.0 lb

## 2020-01-01 DIAGNOSIS — I739 Peripheral vascular disease, unspecified: Secondary | ICD-10-CM | POA: Diagnosis not present

## 2020-01-01 DIAGNOSIS — I7025 Atherosclerosis of native arteries of other extremities with ulceration: Secondary | ICD-10-CM

## 2020-01-01 DIAGNOSIS — I10 Essential (primary) hypertension: Secondary | ICD-10-CM | POA: Diagnosis not present

## 2020-01-01 NOTE — Telephone Encounter (Signed)
Patient son has been made aware with medical advice and informed that he will call if ultrasound need to be schedule for his father

## 2020-01-01 NOTE — Telephone Encounter (Signed)
I didn't notice any knot when I felt his pulses.  The ultrasound looked at his arteries.  If there is a knot it isn't related to his arteries because you wouldn't feel it through the skin.  If anything it could be a superficial thrombophlebitis if it is palpable.  If the son likes we can get him in for an ultrasound only to rule out a blood clot if he is concerned.

## 2020-01-01 NOTE — Progress Notes (Signed)
SUBJECTIVE:  Patient ID: [name], male    DOB: [birth year], 79 y.o.   MRN: [id number] Chief Complaint  Patient presents with  . Follow-up    ultrasound follow up     HPI  [name] is a 79 y.o. male The patient returns to the office for followup and review of the noninvasive studies. There have been no interval changes in lower extremity symptoms. No interval shortening of the patient's claudication distance or development of rest pain symptoms. No new ulcers or wounds have occurred since the last visit.  There have been no significant changes to the patient's overall health care.  The patient denies amaurosis fugax or recent TIA symptoms. There are no recent neurological changes noted. The patient denies history of DVT, PE or superficial thrombophlebitis. The patient denies recent episodes of angina or shortness of breath.   ABI Rt=0.93 and Lt=0.99  (previous ABI's Rt=0.86 and Lt=1.08) Duplex ultrasound of the bilateral tibial arteries has triphasic waveforms with good toe waveforms bilaterally Past Medical History:  Diagnosis Date  . Arthritis   . 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 Diabetes mellitus   . Dyspnea   . Goals of care, counseling/discussion 09/08/2017  . 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 Hypertension   . Hypothyroidism   . Neuropathy   . Peripheral vascular disease ([place])   . Type 2 diabetes, controlled, with neuropathy ([place]) 04/07/2015  . Venous stasis     Past Surgical History:  Procedure Laterality Date  . CATARACT EXTRACTION W/PHACO Right 10/25/2016   Procedure: CATARACT EXTRACTION PHACO AND INTRAOCULAR LENS PLACEMENT (IOC)  right eye complicated;  Surgeon: Ronnell Freshwater, MD;  Location: [place];  Service: Ophthalmology;  Laterality: Right;  Right eye Diabetic - oral meds Vision blue  . FINGER SURGERY    . LOWER EXTREMITY ANGIOGRAPHY Left 08/28/2019   Procedure: LOWER EXTREMITY  ANGIOGRAPHY;  Surgeon: Katha Cabal, MD;  Location: [facility];  Service: Cardiovascular;  Laterality: Left;  . LOWER EXTREMITY ANGIOGRAPHY Left 09/11/2019   Procedure: LOWER EXTREMITY ANGIOGRAPHY;  Surgeon: Katha Cabal, MD;  Location: [facility];  Service: Cardiovascular;  Laterality: Left;    Social History   Socioeconomic History  . Marital status: Married    Spouse name: unknown  . Number of children: 2  . Years of education: Not on file  . Highest education level: Not on file  Occupational History  . Occupation: Custodian  Tobacco Use  . Smoking status: Former Smoker    Packs/day: 0.25    Types: Cigarettes    Quit date: 03/23/2015    Years since quitting: 4.7  . Smokeless tobacco: Current User    Types: Chew  Substance and Sexual Activity  . Alcohol use: Not Currently    Alcohol/week: 42.0 standard drinks    Types: 42 Cans of beer per week    Comment: not currently  . Drug use: No  . Sexual activity: Not Currently  Other Topics Concern  . Not on file  Social History Narrative   Lives at home with son   Social Determinants of Health   Financial Resource Strain: Medium Risk  . Difficulty of Paying Living Expenses: Somewhat hard  Food Insecurity: No Food Insecurity  . Worried About Charity fundraiser in the Last Year: Never true  . Ran Out of Food in the Last Year: Never true  Transportation Needs: No Transportation Needs  . Lack of Transportation (Medical): No  . Lack of Transportation (Non-Medical): No  Physical Activity: Inactive  . Days of Exercise per Week: 0 days  . Minutes of Exercise per Session: 0 min  Stress: No Stress Concern Present  . Feeling of Stress : Not at all  Social Connections:   . Frequency of Communication with Friends and Family:   . Frequency of Social Gatherings with Friends and Family:   . Attends Religious Services:   . Active Member of Clubs or Organizations:   . Attends Archivist  Meetings:   Marland Kitchen Marital Status:   Intimate Partner Violence:   . Fear of Current or Ex-Partner:   . Emotionally Abused:   Marland Kitchen Physically Abused:   . Sexually Abused:     Family History  Problem Relation Age of Onset  . Heart disease Mother   . Alzheimer's disease Sister   . Heart disease Maternal Aunt     Allergies  Allergen Reactions  . Metformin And Related Nausea And Vomiting     Review of Systems   Review of Systems: Negative Unless Checked Constitutional: [] Weight loss  [] Fever  [] Chills Cardiac: [] Chest pain   []  Atrial Fibrillation  [] Palpitations   [] Shortness of breath when laying flat   [] Shortness of breath with exertion. [] Shortness of breath at rest Vascular:  [] Pain in legs with walking   [] Pain in legs with standing [] Pain in legs when laying flat   [] Claudication    [] Pain in feet when laying flat    [] History of DVT   [] Phlebitis   [] Swelling in legs   [] Varicose veins   [] Non-healing ulcers Pulmonary:   [] Uses home oxygen   [] Productive cough   [] Hemoptysis   [] Wheeze  [] COPD   [] Asthma Neurologic:  [] Dizziness   [] Seizures  [] Blackouts [] History of stroke   [] History of TIA  [] Aphasia   [] Temporary Blindness   [x] Weakness or numbness in arm   [] Weakness or numbness in leg Musculoskeletal:   [] Joint swelling   [] Joint pain   [] Low back pain  []  History of Knee Replacement [] Arthritis [] back Surgeries  []  Spinal Stenosis    Hematologic:  [] Easy bruising  [] Easy bleeding   [] Hypercoagulable state   [x] Anemic Gastrointestinal:  [] Diarrhea   [] Vomiting  [] Gastroesophageal reflux/heartburn   [] Difficulty swallowing. [] Abdominal pain Genitourinary:  [x] Chronic kidney disease   [] Difficult urination  [] Anuric   [] Blood in urine [] Frequent urination  [] Burning with urination   [] Hematuria Skin:  [] Rashes   [] Ulcers [] Wounds Psychological:  [] History of anxiety   []  History of major depression  []  Memory Difficulties      OBJECTIVE:   Physical Exam  BP (!) 111/50 (BP  Location: Left Arm)   Pulse 92   Resp 18   Wt 179 lb (81.2 kg)   BMI 28.04 kg/m   Gen: WD/WN, NAD Head: [place]/AT, No temporalis wasting.  Ear/Nose/Throat: Hearing grossly intact, nares w/o erythema or drainage Eyes: PER, EOMI, sclera nonicteric.  Neck: Supple, no masses.  No JVD.  Pulmonary:  Good air movement, no use of accessory muscles.  Cardiac: RRR Vascular:  Vessel Right Left  Radial Palpable Palpable  Dorsalis Pedis Palpable Palpable  Posterior Tibial Palpable Palpable   Gastrointestinal: soft, non-distended. No guarding/no peritoneal signs.  Musculoskeletal: M/S 5/5 throughout.  No deformity or atrophy.  Neurologic: Pain and light touch intact in extremities.  Symmetrical.  Speech is fluent. Motor exam as listed above. Psychiatric: Judgment intact,  Mood & affect appropriate for pt's clinical situation. Dermatologic: No Venous rashes. No Ulcers Noted.  No changes consistent with cellulitis. Lymph : No Cervical lymphadenopathy, no lichenification or skin changes of chronic lymphedema.       ASSESSMENT AND PLAN:  1. Atherosclerosis of native arteries of the extremities with ulceration ([place])  Recommend:  The patient has evidence of atherosclerosis of the lower extremities with claudication.  The patient does not voice lifestyle limiting changes at this point in time.  Noninvasive studies do not suggest clinically significant change.  No invasive studies, angiography or surgery at this time The patient should continue walking and begin a more formal exercise program.  The patient should continue antiplatelet therapy and aggressive treatment of the lipid abnormalities  No changes in the patient's medications at this time  The patient should continue wearing graduated compression socks 10-15 mmHg strength to control the mild edema.    2. Essential hypertension Continue antihypertensive medications as already ordered, these medications have been reviewed and there are no  changes at this time.    Current Outpatient Medications on File Prior to Visit  Medication Sig Dispense Refill  . acetaminophen (TYLENOL) 500 MG tablet Take 1,000 mg by mouth daily as needed for mild pain or moderate pain.     Marland Kitchen amitriptyline (ELAVIL) 100 MG tablet TAKE 1 TABLET BY MOUTH AT BEDTIME 90 tablet 0  . Benzocaine (ORAL GEL ANESTHETIC MT) Use as directed 1 tablet in the mouth or throat daily as needed (Toothache).    . clopidogrel (PLAVIX) 75 MG tablet Take 1 tablet (75 mg total) by mouth daily. 30 tablet 4  . ibuprofen (ADVIL) 100 MG tablet Take 100 mg by mouth 3 (three) times daily.    Marland Kitchen levothyroxine (SYNTHROID) 25 MCG tablet Take 1 tablet (25 mcg total) by mouth daily before breakfast. 90 tablet 3  . Multiple Vitamins-Minerals (MULTIVITAMIN WITH MINERALS) tablet Take 1 tablet by mouth daily.    Marland Kitchen OVER THE COUNTER MEDICATION Use as directed 1 application in the mouth or throat daily as needed (Toothache). curoxen mouth sore treatment    . atorvastatin (LIPITOR) 10 MG tablet Take 1 tablet (10 mg total) by mouth daily. Additional refills per the patient's primary medical provider 30 tablet 1   Current Facility-Administered Medications on File Prior to Visit  Medication Dose Route Frequency Provider Last Rate Last Admin  . cyanocobalamin ((VITAMIN B-12)) injection 1,000 mcg  1,000 mcg Intramuscular Q30 days Johnson, Megan P, DO        There are no Patient Instructions on file for this visit. No follow-ups on file.   Kris Hartmann, NP  This note was completed with Sales executive.  Any errors are purely unintentional.

## 2020-01-02 ENCOUNTER — Telehealth: Payer: Self-pay

## 2020-01-09 ENCOUNTER — Ambulatory Visit: Payer: Self-pay

## 2020-01-09 NOTE — Chronic Care Management (AMB) (Signed)
  Care Management   Follow Up Note   01/09/2020 Name: [name] MRN: [id number] DOB: Feb 20, 1941  Referred by: Valerie Roys, DO Reason for referral : Care Coordination   [name] is a 79 y.o. year old male who is a primary care patient of Valerie Roys, DO. The care management team was consulted for assistance with care management and care coordination needs.    Review of patient status, including review of consultants reports, relevant laboratory and other test results, and collaboration with appropriate care team members and the patient's provider was performed as part of comprehensive patient evaluation and provision of chronic care management services.    LCSW completed CCM outreach attempt today but was unable to reach patient successfully. A HIPPA compliant voice message was left encouraging patient to return call once available. LCSW rescheduled CCM SW appointment as well.  A HIPPA compliant phone message was left for the patient providing contact information and requesting a return call.   Eula Fried, BSW, MSW, [facility] Care Management [email or web address] Phone: [phone]

## 2020-01-14 ENCOUNTER — Ambulatory Visit (INDEPENDENT_AMBULATORY_CARE_PROVIDER_SITE_OTHER): Payer: Medicare Other | Admitting: Licensed Clinical Social Worker

## 2020-01-14 DIAGNOSIS — I129 Hypertensive chronic kidney disease with stage 1 through stage 4 chronic kidney disease, or unspecified chronic kidney disease: Secondary | ICD-10-CM

## 2020-01-14 DIAGNOSIS — N181 Chronic kidney disease, stage 1: Secondary | ICD-10-CM | POA: Diagnosis not present

## 2020-01-14 DIAGNOSIS — I739 Peripheral vascular disease, unspecified: Secondary | ICD-10-CM

## 2020-01-14 DIAGNOSIS — E1122 Type 2 diabetes mellitus with diabetic chronic kidney disease: Secondary | ICD-10-CM | POA: Diagnosis not present

## 2020-01-14 DIAGNOSIS — G629 Polyneuropathy, unspecified: Secondary | ICD-10-CM

## 2020-01-14 NOTE — Chronic Care Management (AMB) (Signed)
Chronic Care Management    Clinical Social Work Follow Up Note  01/14/2020 Name: [name] MRN: [id number] DOB: [birth year]  [name] is a 79 y.o. year old male who is a primary care patient of Valerie Roys, DO. The CCM team was consulted for assistance with [employer] .   Review of patient status, including review of consultants reports, other relevant assessments, and collaboration with appropriate care team members and the patient's provider was performed as part of comprehensive patient evaluation and provision of chronic care management services.    SDOH (Social Determinants of Health) assessments performed: Yes    Outpatient Encounter Medications as of 01/14/2020  Medication Sig  . acetaminophen (TYLENOL) 500 MG tablet Take 1,000 mg by mouth daily as needed for mild pain or moderate pain.   Marland Kitchen amitriptyline (ELAVIL) 100 MG tablet TAKE 1 TABLET BY MOUTH AT BEDTIME  . atorvastatin (LIPITOR) 10 MG tablet Take 1 tablet (10 mg total) by mouth daily. Additional refills per the patient's primary medical provider  . Benzocaine (ORAL GEL ANESTHETIC MT) Use as directed 1 tablet in the mouth or throat daily as needed (Toothache).  . clopidogrel (PLAVIX) 75 MG tablet Take 1 tablet (75 mg total) by mouth daily.  Marland Kitchen ibuprofen (ADVIL) 100 MG tablet Take 100 mg by mouth 3 (three) times daily.  Marland Kitchen levothyroxine (SYNTHROID) 25 MCG tablet Take 1 tablet (25 mcg total) by mouth daily before breakfast.  . Multiple Vitamins-Minerals (MULTIVITAMIN WITH MINERALS) tablet Take 1 tablet by mouth daily.  Marland Kitchen OVER THE COUNTER MEDICATION Use as directed 1 application in the mouth or throat daily as needed (Toothache). curoxen mouth sore treatment   Facility-Administered Encounter Medications as of 01/14/2020  Medication  . cyanocobalamin ((VITAMIN B-12)) injection 1,000 mcg     Goals Addressed    . "I need financial and food support resource education." (pt-stated)       Current Barriers:   . Financial constraints related to managing health care . Limited social support . Limited access to food . ADL IADL limitations  Clinical Social Work Clinical Goal(s):  Marland Kitchen Over the next 90 days, client will work with SW to address concerns related to gaining appropriate food and financial support education  Interventions: . Patient interviewed and appropriate assessments performed . Provided patient with information about available community resources within the area that may be beneficial to patient/family . Discussed plans with patient for ongoing care management follow up and provided patient with direct contact information for care management team . Advised patient/family to review and revert back to community resources that LCSW successfully sent out through mailbox whenever needed.  . Family reports that he utilizes local food pantries at churches for food assistance. . Patient's family confirms no urgent needs at this time and confirms food in the house. . Patient's son confirms that he is able to provide stable transportation for patient to his medical appointments. Patient has transportation benefits through both Medicaid and [place]. Benefit education provided. . Son shares that patient's blood flow is "better" [facility]. Marland Kitchen Son was encouraged to utilize patient's Medicaid caseworker for general resource connection. Son reports patient is in need of dentures. Family was advised to contact caseworker to gain dental assistance as benefits are available through his Medicaid. . Assisted patient/caregiver with obtaining information about health plan benefits . Provided education to patient/caregiver regarding level of care options.  Patient Self Care Activities:  . Attends all scheduled  provider appointments . Calls provider office for new concerns or questions  Please see past updates related to this goal by clicking on the "Past Updates" button in the  selected goal      Follow Up Plan: Embedded care coordination team will continue to follow patient progress and assist with care coordination needs as needed.  Eula Fried, BSW, MSW, [facility] Care Management [email or web address] Phone: [phone]

## 2020-01-29 ENCOUNTER — Telehealth (INDEPENDENT_AMBULATORY_CARE_PROVIDER_SITE_OTHER): Payer: Self-pay | Admitting: Vascular Surgery

## 2020-01-29 NOTE — Telephone Encounter (Signed)
Can you schedule the pt for the following per fallon.

## 2020-01-29 NOTE — Telephone Encounter (Signed)
Does this pt need to come in and be seen.

## 2020-01-29 NOTE — Telephone Encounter (Signed)
He can come in with ABIs with GS

## 2020-01-31 ENCOUNTER — Telehealth (INDEPENDENT_AMBULATORY_CARE_PROVIDER_SITE_OTHER): Payer: Self-pay | Admitting: Vascular Surgery

## 2020-01-31 ENCOUNTER — Ambulatory Visit (INDEPENDENT_AMBULATORY_CARE_PROVIDER_SITE_OTHER): Payer: Medicare Other

## 2020-01-31 ENCOUNTER — Other Ambulatory Visit (INDEPENDENT_AMBULATORY_CARE_PROVIDER_SITE_OTHER): Payer: Self-pay | Admitting: Nurse Practitioner

## 2020-01-31 ENCOUNTER — Encounter (INDEPENDENT_AMBULATORY_CARE_PROVIDER_SITE_OTHER): Payer: Self-pay | Admitting: Nurse Practitioner

## 2020-01-31 ENCOUNTER — Other Ambulatory Visit: Payer: Self-pay

## 2020-01-31 ENCOUNTER — Ambulatory Visit (INDEPENDENT_AMBULATORY_CARE_PROVIDER_SITE_OTHER): Payer: Medicare Other | Admitting: Nurse Practitioner

## 2020-01-31 VITALS — BP 149/66 | HR 86 | Resp 16 | Ht 71.0 in | Wt 178.0 lb

## 2020-01-31 DIAGNOSIS — S90129A Contusion of unspecified lesser toe(s) without damage to nail, initial encounter: Secondary | ICD-10-CM

## 2020-01-31 DIAGNOSIS — I739 Peripheral vascular disease, unspecified: Secondary | ICD-10-CM

## 2020-01-31 DIAGNOSIS — I7025 Atherosclerosis of native arteries of other extremities with ulceration: Secondary | ICD-10-CM | POA: Diagnosis not present

## 2020-01-31 DIAGNOSIS — I10 Essential (primary) hypertension: Secondary | ICD-10-CM

## 2020-01-31 DIAGNOSIS — S81809S Unspecified open wound, unspecified lower leg, sequela: Secondary | ICD-10-CM

## 2020-02-05 ENCOUNTER — Encounter (INDEPENDENT_AMBULATORY_CARE_PROVIDER_SITE_OTHER): Payer: Self-pay | Admitting: Nurse Practitioner

## 2020-02-05 NOTE — Progress Notes (Signed)
Subjective:    Patient ID: [name], male    DOB: Sep 30, 1941, 79 y.o.   MRN: [id number] Chief Complaint  Patient presents with  . Follow-up    ultrasound follow up     Patient presents today after the patient's son called to state that his father had bruised both of his great toes.  On evaluation these toes are not quite bruises but very shallow callused areas.  They are not quite ulcerations.  They are in the same spot on the bilateral great toes.  The patient states that it does not feel as if his feet are too tight or rubbing.  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  The patient states that they do not hurt at all.  The patient son is unsure that he hit his toes on anything that could have caused the wound.  Otherwise he denies any claudication-like symptoms or rest pain.  Today the right ABI is 1.14 and the left is 1.05.  Patient's previous ABI on 01/01/2020 was 0.93 and left was 0.99.  The patient has triphasic tibial artery waveforms with good toe waveforms bilaterally.   Review of Systems  Musculoskeletal: Positive for gait problem.  Hematological: Bruises/bleeds easily.  All other systems reviewed and are negative.      Objective:   Physical Exam Vitals reviewed. Exam conducted with a chaperone present (Son present).  HENT:     Head: Normocephalic.  Cardiovascular:     Rate and Rhythm: Normal rate and regular rhythm.     Pulses: Normal pulses.  Neurological:     Mental Status: He is alert and oriented to person, place, and time.     BP (!) 149/66 (BP Location: Right Arm)   Pulse 86   Resp 16   Ht 5\' 11"  (1.803 m)   Wt 178 lb (80.7 kg)   BMI 24.83 kg/m   Past Medical History:  Diagnosis Date  . Arthritis   . 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 Diabetes mellitus   . Dyspnea   . Goals of care, counseling/discussion 09/08/2017  . 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  Hypertension   . Hypothyroidism   . Neuropathy   . Peripheral vascular disease ([place])   . Type 2 diabetes, controlled, with neuropathy ([place]) 04/07/2015  . Venous stasis     Social History   Socioeconomic History  . Marital status: Married    Spouse name: unknown  . Number of children: 2  . Years of education: Not on file  . Highest education level: Not on file  Occupational History  . Occupation: Custodian  Tobacco Use  . Smoking status: Former Smoker    Packs/day: 0.25    Types: Cigarettes    Quit date: 03/23/2015    Years since quitting: 4.8  . Smokeless tobacco: Current User    Types: Chew  Substance and Sexual Activity  . Alcohol use: Not Currently    Alcohol/week: 42.0 standard drinks    Types: 42 Cans of beer per week    Comment: not currently  . Drug use: No  . Sexual activity: Not Currently  Other Topics Concern  . Not on file  Social History Narrative   Lives at home with son   Social Determinants of Health   Financial Resource Strain: Medium Risk  . Difficulty of Paying Living Expenses: Somewhat hard  Food Insecurity:  No Food Insecurity  . Worried About Charity fundraiser in the Last Year: Never true  . Ran Out of Food in the Last Year: Never true  Transportation Needs: No Transportation Needs  . Lack of Transportation (Medical): No  . Lack of Transportation (Non-Medical): No  Physical Activity: Inactive  . Days of Exercise per Week: 0 days  . Minutes of Exercise per Session: 0 min  Stress: No Stress Concern Present  . Feeling of Stress : Not at all  Social Connections:   . Frequency of Communication with Friends and Family:   . Frequency of Social Gatherings with Friends and Family:   . Attends Religious Services:   . Active Member of Clubs or Organizations:   . Attends Archivist Meetings:   Marland Kitchen Marital Status:   Intimate Partner Violence:   . Fear of Current or Ex-Partner:   . Emotionally Abused:   Marland Kitchen Physically Abused:   . Sexually  Abused:     Past Surgical History:  Procedure Laterality Date  . CATARACT EXTRACTION W/PHACO Right 10/25/2016   Procedure: CATARACT EXTRACTION PHACO AND INTRAOCULAR LENS PLACEMENT (IOC)  right eye complicated;  Surgeon: Ronnell Freshwater, MD;  Location: [place];  Service: Ophthalmology;  Laterality: Right;  Right eye Diabetic - oral meds Vision blue  . FINGER SURGERY    . LOWER EXTREMITY ANGIOGRAPHY Left 08/28/2019   Procedure: LOWER EXTREMITY ANGIOGRAPHY;  Surgeon: Katha Cabal, MD;  Location: [place] [facility];  Service: Cardiovascular;  Laterality: Left;  . LOWER EXTREMITY ANGIOGRAPHY Left 09/11/2019   Procedure: LOWER EXTREMITY ANGIOGRAPHY;  Surgeon: Katha Cabal, MD;  Location: [place] [facility];  Service: Cardiovascular;  Laterality: Left;    Family History  Problem Relation Age of Onset  . Heart disease Mother   . Alzheimer's disease Sister   . Heart disease Maternal Aunt     Allergies  Allergen Reactions  . Metformin And Related Nausea And Vomiting       Assessment & Plan:   1. Atherosclerosis of native arteries of the extremities with ulceration ([place]) The patient has 2 very small wounds on the tips of each of his great toes.  However, his noninvasive studies today show that he has good perfusion and should be able to heal these wounds.  Based on their appearance it may be due to the patient either rubbing them against a certain area causing the wounds.  In either case we will have the patient follow-up in 3 months with noninvasive studies to maintain close follow-up of his wound.  Patient is advised to contact her office if the wounds begin to grow larger prior to his follow-up visit.  2. Essential hypertension Continue antihypertensive medications as already ordered, these medications have been reviewed and there are no changes at this time.    Current Outpatient Medications on File Prior to Visit  Medication Sig Dispense Refill  .  acetaminophen (TYLENOL) 500 MG tablet Take 1,000 mg by mouth daily as needed for mild pain or moderate pain.     Marland Kitchen amitriptyline (ELAVIL) 100 MG tablet TAKE 1 TABLET BY MOUTH AT BEDTIME 90 tablet 0  . Benzocaine (ORAL GEL ANESTHETIC MT) Use as directed 1 tablet in the mouth or throat daily as needed (Toothache).    . clopidogrel (PLAVIX) 75 MG tablet Take 1 tablet (75 mg total) by mouth daily. 30 tablet 4  . ibuprofen (ADVIL) 100 MG tablet Take 100 mg by mouth 3 (three) times  daily.    . levothyroxine (SYNTHROID) 25 MCG tablet Take 1 tablet (25 mcg total) by mouth daily before breakfast. 90 tablet 3  . Multiple Vitamins-Minerals (MULTIVITAMIN WITH MINERALS) tablet Take 1 tablet by mouth daily.    Marland Kitchen OVER THE COUNTER MEDICATION Use as directed 1 application in the mouth or throat daily as needed (Toothache). curoxen mouth sore treatment    . atorvastatin (LIPITOR) 10 MG tablet Take 1 tablet (10 mg total) by mouth daily. Additional refills per the patient's primary medical provider 30 tablet 1   Current Facility-Administered Medications on File Prior to Visit  Medication Dose Route Frequency Provider Last Rate Last Admin  . cyanocobalamin ((VITAMIN B-12)) injection 1,000 mcg  1,000 mcg Intramuscular Q30 days Johnson, Megan P, DO        There are no Patient Instructions on file for this visit. No follow-ups on file.   Kris Hartmann, NP

## 2020-02-15 ENCOUNTER — Other Ambulatory Visit (INDEPENDENT_AMBULATORY_CARE_PROVIDER_SITE_OTHER): Payer: Self-pay | Admitting: Vascular Surgery

## 2020-02-27 ENCOUNTER — Ambulatory Visit: Payer: Self-pay

## 2020-02-27 NOTE — Telephone Encounter (Signed)
Patients son called to report his father is having memory issues.  He states that last week his dad had episode of weakness and numbness to hands and feet. He states that he tried to take him to the ER but his father refused. He states that now he is unsteady on his feet but seem to think he is at baseline. He reports multiple abrasions and bruises from what he suspects is his father falling. He report that he is drinking pepsi only. No speech issues or vision changes reported. Per protocol son will take his dad to ER for evaluation of neurologic changes.  Reason for Disposition . [1] Numbness (i.e., loss of sensation) of the face, arm / hand, or leg / foot on one side of the body AND [2] sudden onset AND [3] brief (now gone)  Answer Assessment - Initial Assessment Questions 1. SYMPTOM: "What is the main symptom you are concerned about?" (e.g., weakness, numbness)     unble to remember 2. ONSET: "When did this start?" (minutes, hours, days; while sleeping)     Last week with numbness in his hands and feet 3. LAST NORMAL: "When was the last time you were normal (no symptoms)?"    1 week 4. PATTERN "Does this come and go, or has it been constant since it started?"  "Is it present now?"    Just memory issues 5. CARDIAC SYMPTOMS: "Have you had any of the following symptoms: chest pain, difficulty breathing, palpitations?"     no 6. NEUROLOGIC SYMPTOMS: "Have you had any of the following symptoms: headache, dizziness, vision loss, double vision, changes in speech, unsteady on your feet?"     Dizzy,unsteady but this is his baseline 7. OTHER SYMPTOMS: "Do you have any other symptoms?"    May have been falling has cut and scrapes that are un explained 8. PREGNANCY: "Is there any chance you are pregnant?" "When was your last menstrual period?"    N/A  Protocols used: NEUROLOGIC DEFICIT-A-AH

## 2020-02-28 ENCOUNTER — Inpatient Hospital Stay
Admission: EM | Admit: 2020-02-28 | Discharge: 2020-02-29 | DRG: 812 | Disposition: A | Discharge status: Hospice | Payer: Medicare Other | Attending: Internal Medicine | Admitting: Internal Medicine

## 2020-02-28 ENCOUNTER — Emergency Department: Payer: Medicare Other

## 2020-02-28 ENCOUNTER — Other Ambulatory Visit: Payer: Self-pay

## 2020-02-28 DIAGNOSIS — R195 Other fecal abnormalities: Secondary | ICD-10-CM | POA: Diagnosis present

## 2020-02-28 DIAGNOSIS — D696 Thrombocytopenia, unspecified: Secondary | ICD-10-CM

## 2020-02-28 DIAGNOSIS — N181 Chronic kidney disease, stage 1: Secondary | ICD-10-CM | POA: Diagnosis not present

## 2020-02-28 DIAGNOSIS — K922 Gastrointestinal hemorrhage, unspecified: Secondary | ICD-10-CM | POA: Diagnosis present

## 2020-02-28 DIAGNOSIS — F1011 Alcohol abuse, in remission: Secondary | ICD-10-CM | POA: Diagnosis present

## 2020-02-28 DIAGNOSIS — Z7902 Long term (current) use of antithrombotics/antiplatelets: Secondary | ICD-10-CM

## 2020-02-28 DIAGNOSIS — Z66 Do not resuscitate: Secondary | ICD-10-CM | POA: Diagnosis present

## 2020-02-28 DIAGNOSIS — E1122 Type 2 diabetes mellitus with diabetic chronic kidney disease: Secondary | ICD-10-CM | POA: Diagnosis not present

## 2020-02-28 DIAGNOSIS — E039 Hypothyroidism, unspecified: Secondary | ICD-10-CM | POA: Diagnosis not present

## 2020-02-28 DIAGNOSIS — I10 Essential (primary) hypertension: Secondary | ICD-10-CM | POA: Diagnosis not present

## 2020-02-28 DIAGNOSIS — Z791 Long term (current) use of non-steroidal anti-inflammatories (NSAID): Secondary | ICD-10-CM

## 2020-02-28 DIAGNOSIS — D62 Acute posthemorrhagic anemia: Secondary | ICD-10-CM | POA: Diagnosis not present

## 2020-02-28 DIAGNOSIS — Z86718 Personal history of other venous thrombosis and embolism: Secondary | ICD-10-CM

## 2020-02-28 DIAGNOSIS — F17290 Nicotine dependence, other tobacco product, uncomplicated: Secondary | ICD-10-CM | POA: Diagnosis present

## 2020-02-28 DIAGNOSIS — E1140 Type 2 diabetes mellitus with diabetic neuropathy, unspecified: Secondary | ICD-10-CM | POA: Diagnosis not present

## 2020-02-28 DIAGNOSIS — Z820 Family history of epilepsy and other diseases of the nervous system: Secondary | ICD-10-CM

## 2020-02-28 DIAGNOSIS — Z20822 Contact with and (suspected) exposure to covid-19: Secondary | ICD-10-CM | POA: Diagnosis present

## 2020-02-28 DIAGNOSIS — E1151 Type 2 diabetes mellitus with diabetic peripheral angiopathy without gangrene: Secondary | ICD-10-CM | POA: Diagnosis present

## 2020-02-28 DIAGNOSIS — D61818 Other pancytopenia: Secondary | ICD-10-CM | POA: Diagnosis not present

## 2020-02-28 DIAGNOSIS — F0390 Unspecified dementia without behavioral disturbance: Secondary | ICD-10-CM | POA: Diagnosis present

## 2020-02-28 DIAGNOSIS — Z515 Encounter for palliative care: Secondary | ICD-10-CM | POA: Diagnosis present

## 2020-02-28 DIAGNOSIS — E785 Hyperlipidemia, unspecified: Secondary | ICD-10-CM | POA: Diagnosis not present

## 2020-02-28 DIAGNOSIS — Z79899 Other long term (current) drug therapy: Secondary | ICD-10-CM

## 2020-02-28 DIAGNOSIS — Z8249 Family history of ischemic heart disease and other diseases of the circulatory system: Secondary | ICD-10-CM

## 2020-02-28 DIAGNOSIS — M1990 Unspecified osteoarthritis, unspecified site: Secondary | ICD-10-CM | POA: Diagnosis not present

## 2020-02-28 DIAGNOSIS — C9000 Multiple myeloma not having achieved remission: Secondary | ICD-10-CM | POA: Diagnosis not present

## 2020-02-28 DIAGNOSIS — C9002 Multiple myeloma in relapse: Secondary | ICD-10-CM | POA: Diagnosis not present

## 2020-02-28 DIAGNOSIS — D649 Anemia, unspecified: Secondary | ICD-10-CM

## 2020-02-28 DIAGNOSIS — I129 Hypertensive chronic kidney disease with stage 1 through stage 4 chronic kidney disease, or unspecified chronic kidney disease: Secondary | ICD-10-CM | POA: Diagnosis present

## 2020-02-28 DIAGNOSIS — R531 Weakness: Secondary | ICD-10-CM | POA: Diagnosis not present

## 2020-02-28 DIAGNOSIS — Z87891 Personal history of nicotine dependence: Secondary | ICD-10-CM | POA: Diagnosis not present

## 2020-02-28 DIAGNOSIS — E876 Hypokalemia: Secondary | ICD-10-CM | POA: Diagnosis not present

## 2020-02-28 DIAGNOSIS — Z79890 Hormone replacement therapy (postmenopausal): Secondary | ICD-10-CM

## 2020-02-28 LAB — URINE DRUG SCREEN, QUALITATIVE (ARMC ONLY)
Amphetamines, Ur Screen: NOT DETECTED
Barbiturates, Ur Screen: NOT DETECTED
Benzodiazepine, Ur Scrn: NOT DETECTED
Cannabinoid 50 Ng, Ur ~~LOC~~: NOT DETECTED
Cocaine Metabolite,Ur ~~LOC~~: NOT DETECTED
MDMA (Ecstasy)Ur Screen: NOT DETECTED
Methadone Scn, Ur: NOT DETECTED
Opiate, Ur Screen: NOT DETECTED
Phencyclidine (PCP) Ur S: NOT DETECTED
Tricyclic, Ur Screen: POSITIVE — AB

## 2020-02-28 LAB — BASIC METABOLIC PANEL
Anion gap: 7 (ref 5–15)
BUN: 6 mg/dL — ABNORMAL LOW (ref 8–23)
CO2: 28 mmol/L (ref 22–32)
Calcium: 8.4 mg/dL — ABNORMAL LOW (ref 8.9–10.3)
Chloride: 105 mmol/L (ref 98–111)
Creatinine, Ser: 0.8 mg/dL (ref 0.61–1.24)
GFR calc Af Amer: >60 mL/min (ref >60)
GFR calc non Af Amer: >60 mL/min (ref >60)
Glucose, Bld: 163 mg/dL — ABNORMAL HIGH (ref 70–99)
Potassium: 3.4 mmol/L — ABNORMAL LOW (ref 3.5–5.1)
Sodium: 140 mmol/L (ref 135–145)

## 2020-02-28 LAB — GLUCOSE, CAPILLARY: Glucose-Capillary: 146 mg/dL — ABNORMAL HIGH (ref 70–99)

## 2020-02-28 LAB — CBC
HCT: 20.3 % — ABNORMAL LOW (ref 39.0–52.0)
Hemoglobin: 6.6 g/dL — ABNORMAL LOW (ref 13.0–17.0)
MCH: 33 pg (ref 26.0–34.0)
MCHC: 32.5 g/dL (ref 30.0–36.0)
MCV: 101.5 fL — ABNORMAL HIGH (ref 80.0–100.0)
Platelets: 74 10*3/uL — ABNORMAL LOW (ref 150–400)
RBC: 2 MIL/uL — ABNORMAL LOW (ref 4.22–5.81)
RDW: 16.9 % — ABNORMAL HIGH (ref 11.5–15.5)
WBC: 1.6 10*3/uL — ABNORMAL LOW (ref 4.0–10.5)
nRBC: 0 % (ref 0.0–0.2)

## 2020-02-28 LAB — SARS CORONAVIRUS 2 BY RT PCR (HOSPITAL ORDER, PERFORMED IN ~~LOC~~ HOSPITAL LAB): SARS Coronavirus 2: NEGATIVE

## 2020-02-28 LAB — URINALYSIS, COMPLETE (UACMP) WITH MICROSCOPIC
Bacteria, UA: NONE SEEN
Bilirubin Urine: NEGATIVE
Glucose, UA: NEGATIVE mg/dL
Hgb urine dipstick: NEGATIVE
Ketones, ur: NEGATIVE mg/dL
Nitrite: NEGATIVE
Protein, ur: 30 mg/dL — AB
Specific Gravity, Urine: 1.009 (ref 1.005–1.030)
pH: 6 (ref 5.0–8.0)

## 2020-02-28 LAB — HEPATIC FUNCTION PANEL
ALT: 32 U/L (ref 0–44)
AST: 69 U/L — ABNORMAL HIGH (ref 15–41)
Albumin: 3.5 g/dL (ref 3.5–5.0)
Alkaline Phosphatase: 114 U/L (ref 38–126)
Bilirubin, Direct: 0.2 mg/dL (ref 0.0–0.2)
Indirect Bilirubin: 0.7 mg/dL (ref 0.3–0.9)
Total Bilirubin: 0.9 mg/dL (ref 0.3–1.2)
Total Protein: 5.7 g/dL — ABNORMAL LOW (ref 6.5–8.1)

## 2020-02-28 LAB — ETHANOL: Alcohol, Ethyl (B): <10 mg/dL (ref <10)

## 2020-02-28 LAB — PROTIME-INR
INR: 1.1 (ref 0.8–1.2)
Prothrombin Time: 13.4 seconds (ref 11.4–15.2)

## 2020-02-28 LAB — PREPARE RBC (CROSSMATCH)

## 2020-02-28 MED ORDER — SODIUM CHLORIDE 0.9% FLUSH: 3.0000 mL | Freq: Once | INTRAVENOUS | Status: DC

## 2020-02-28 MED ORDER — LEVOTHYROXINE SODIUM 25 MCG PO TABS
25.0000 ug | ORAL_TABLET | Freq: Every day | ORAL | Status: DC
  Filled 2020-02-28: qty 1

## 2020-02-28 MED ORDER — OXYCODONE HCL 5 MG PO TABS
5.0000 mg | ORAL_TABLET | ORAL | Status: DC | PRN
  Administered 2020-02-29: 5 mg via ORAL
  Filled 2020-02-28: qty 1

## 2020-02-28 MED ORDER — ATORVASTATIN CALCIUM 20 MG PO TABS
10.0000 mg | ORAL_TABLET | Freq: Every day | ORAL | Status: DC
  Administered 2020-02-28 – 2020-02-29 (×2): 10 mg via ORAL
  Filled 2020-02-28 (×2): qty 1

## 2020-02-28 MED ORDER — BISACODYL 5 MG PO TBEC: 5.0000 mg | DELAYED_RELEASE_TABLET | Freq: Every day | ORAL | Status: DC | PRN

## 2020-02-28 MED ORDER — SODIUM CHLORIDE 0.9% FLUSH
3.0000 mL | Freq: Two times a day (BID) | INTRAVENOUS | Status: DC
  Administered 2020-02-28 – 2020-02-29 (×2): 3 mL via INTRAVENOUS

## 2020-02-28 MED ORDER — ONDANSETRON HCL 4 MG/2ML IJ SOLN: 4.0000 mg | Freq: Four times a day (QID) | INTRAMUSCULAR | Status: DC | PRN

## 2020-02-28 MED ORDER — AMITRIPTYLINE HCL 25 MG PO TABS
100.0000 mg | ORAL_TABLET | Freq: Every day | ORAL | Status: DC
  Administered 2020-02-28: 100 mg via ORAL
  Filled 2020-02-28: qty 2

## 2020-02-28 MED ORDER — ONDANSETRON HCL 4 MG PO TABS: 4.0000 mg | ORAL_TABLET | Freq: Four times a day (QID) | ORAL | Status: DC | PRN

## 2020-02-28 MED ORDER — POTASSIUM CHLORIDE CRYS ER 20 MEQ PO TBCR
40.0000 meq | EXTENDED_RELEASE_TABLET | Freq: Once | ORAL | Status: AC
  Administered 2020-02-28: 40 meq via ORAL
  Filled 2020-02-28: qty 2

## 2020-02-28 MED ORDER — ACETAMINOPHEN 650 MG RE SUPP: 650.0000 mg | Freq: Four times a day (QID) | RECTAL | Status: DC | PRN

## 2020-02-28 MED ORDER — SODIUM CHLORIDE 0.9% IV SOLUTION
Freq: Once | INTRAVENOUS | Status: DC
  Filled 2020-02-28: qty 250

## 2020-02-28 MED ORDER — ACETAMINOPHEN 325 MG PO TABS: 650.0000 mg | ORAL_TABLET | Freq: Four times a day (QID) | ORAL | Status: DC | PRN

## 2020-02-28 NOTE — ED Notes (Signed)
Pt tolerating blood transfusion well with no issue. Family at bedside.

## 2020-02-28 NOTE — H&P (Addendum)
History and Physical    [name] RUE:[id number] DOB: [birth year] DOA: 02/28/2020  PCP: Valerie Roys, DO  Patient coming from: home     Chief Complaint: feeling cold, dizziness  HPI: 79 y/o M w/ PMH of multiple myeloma (possibly in remission), peripheral neuropathy, PVD, DVTs, HLD, hypothyroidism, chronic cough, ethanol abuse in remission (as per [name]) who presents w/ dizziness & feeling cold x 2 days. Hx was obtained from [name] and [name]'s son who is at bedside, both are poor historians. [name] states he feels like he is drunk when he walks. [name] denies any hemoptysis, hematemesis or melena. [name] does not know if ever has had an EGD or colonoscopy in the past. [name] uses NSAIDs rarely but has been taking aspirin more recently and [name] takes plavix daily as well for stent in his leg. [name] rarely goes to the doctor. [name] denies any fever, sweating, chest pain, shortness of breath, nausea, vomiting, abd pain, dysuria, urinary urgency, urinary frequency, diarrhea, or constipation. Of note, [name] has hx of chronic cough as [name] is a former smoker but chews tobacco often. Also, [name] has recently had memory problems as per [name]'s son but denies any hx of dementia. [name] does not usually know which day of the week it is or the year as per [name]'s son.  Review of Systems: As per HPI otherwise 10 point review of systems negative.    Past Medical History:  Diagnosis Date  . Arthritis   . Cancer ([place])    multiple myaloma  . Diabetes mellitus   . Dyspnea   . Goals of care, counseling/discussion 09/08/2017  . 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 Hypertension   . Hypothyroidism   . Neuropathy   . Peripheral vascular disease ([place])   . Type 2 diabetes, controlled, with neuropathy ([place]) 04/07/2015  . Venous stasis     Past Surgical History:  Procedure Laterality Date  . CATARACT EXTRACTION W/PHACO Right 10/25/2016   Procedure: CATARACT EXTRACTION PHACO AND INTRAOCULAR LENS PLACEMENT (IOC)   right eye complicated;  Surgeon: Ronnell Freshwater, MD;  Location: [facility];  Service: Ophthalmology;  Laterality: Right;  Right eye Diabetic - oral meds Vision blue  . FINGER SURGERY    . LOWER EXTREMITY ANGIOGRAPHY Left 08/28/2019   Procedure: LOWER EXTREMITY ANGIOGRAPHY;  Surgeon: Katha Cabal, MD;  Location: [facility];  Service: Cardiovascular;  Laterality: Left;  . LOWER EXTREMITY ANGIOGRAPHY Left 09/11/2019   Procedure: LOWER EXTREMITY ANGIOGRAPHY;  Surgeon: Katha Cabal, MD;  Location: [facility];  Service: Cardiovascular;  Laterality: Left;     reports that he quit smoking about 4 years ago. His smoking use included cigarettes. He smoked 0.25 packs per day. His smokeless tobacco use includes chew. He reports previous alcohol use of about 42.0 standard drinks of alcohol per week. He reports that he does not use drugs.  Allergies  Allergen Reactions  . Metformin And Related Nausea And Vomiting    Family History  Problem Relation Age of Onset  . Heart disease Mother   . Alzheimer's disease Sister   . Heart disease Maternal Aunt      Prior to Admission medications   Medication Sig Start Date End Date Taking? Authorizing Provider  acetaminophen (TYLENOL) 500 MG tablet Take 1,000 mg by mouth daily as needed for mild pain or moderate pain.     [provider]  amitriptyline (ELAVIL) 100  MG tablet TAKE 1 TABLET BY MOUTH AT BEDTIME 11/26/19   Johnson, Megan P, DO  atorvastatin (LIPITOR) 10 MG tablet Take 1 tablet (10 mg total) by mouth daily. Additional refills per the patient's primary medical provider 09/11/19 11/10/19  Schnier, Dolores Lory, MD  Benzocaine (ORAL GEL ANESTHETIC MT) Use as directed 1 tablet in the mouth or throat daily as needed (Toothache).    [provider]  clopidogrel (PLAVIX) 75 MG tablet Take 1 tablet by mouth once daily 02/15/20   Schnier, Dolores Lory, MD  ibuprofen (ADVIL) 100 MG tablet Take 100 mg by  mouth 3 (three) times daily.    [provider]  levothyroxine (SYNTHROID) 25 MCG tablet Take 1 tablet (25 mcg total) by mouth daily before breakfast. 06/29/19   Park Liter P, DO  Multiple Vitamins-Minerals (MULTIVITAMIN WITH MINERALS) tablet Take 1 tablet by mouth daily.    [provider]  OVER THE COUNTER MEDICATION Use as directed 1 application in the mouth or throat daily as needed (Toothache). curoxen mouth sore treatment    [provider]    Physical Exam: Vitals:   02/28/20 1247 02/28/20 1500 02/28/20 1530 02/28/20 1630  BP: (!) 141/47 (!) 137/48 (!) 162/60 (!) 166/52  Pulse: 81 69 64 77  Resp: 18   (!) 24  Temp: 98.3 F (36.8 C)     TempSrc: Oral     SpO2: 99% 100% 97% 100%  Weight:      Height:        Constitutional: NAD, calm, comfortable Vitals:   02/28/20 1247 02/28/20 1500 02/28/20 1530 02/28/20 1630  BP: (!) 141/47 (!) 137/48 (!) 162/60 (!) 166/52  Pulse: 81 69 64 77  Resp: 18   (!) 24  Temp: 98.3 F (36.8 C)     TempSrc: Oral     SpO2: 99% 100% 97% 100%  Weight:      Height:       Eyes: PERRL, lids and conjunctivae normal ENMT: Mucous membranes are moist. Posterior pharynx clear of any exudate or lesions. Neck: normal, supple Respiratory: clear to auscultation bilaterally, no wheezing, no crackles.  Cardiovascular: Regular rate and rhythm, no rubs / gallops. No extremity edema.   Abdomen: soft, no tenderness,non-distended.  Bowel sounds positive.  Musculoskeletal: no clubbing. No joint deformity upper and lower extremities.  Skin: Multiple bruises/petechie on b/l UE. Neurologic: CN 2-12 grossly intact. Moves all 4 extremities.  Psychiatric: Abnormal judgment and insight. Flat mood.     Labs on Admission: I have personally reviewed following labs and imaging studies  CBC: Recent Labs  Lab 02/28/20 1256  WBC 1.6*  HGB 6.6*  HCT 20.3*  MCV 101.5*  PLT 74*   Basic Metabolic Panel: Recent Labs  Lab 02/28/20 1256    NA 140  K 3.4*  CL 105  CO2 28  GLUCOSE 163*  BUN 6*  CREATININE 0.80  CALCIUM 8.4*   GFR: Estimated Creatinine Clearance: 81.1 mL/min (by C-G formula based on SCr of 0.8 mg/dL). Liver Function Tests: Recent Labs  Lab 02/28/20 1609  AST 69*  ALT 32  ALKPHOS 114  BILITOT 0.9  PROT 5.7*  ALBUMIN 3.5   No results for input(s): LIPASE, AMYLASE in the last 168 hours. No results for input(s): AMMONIA in the last 168 hours. Coagulation Profile: Recent Labs  Lab 02/28/20 1609  INR 1.1   Cardiac Enzymes: No results for input(s): CKTOTAL, CKMB, CKMBINDEX, TROPONINI in the last 168 hours. BNP (last 3 results) No results  for input(s): PROBNP in the last 8760 hours. HbA1C: No results for input(s): HGBA1C in the last 72 hours. CBG: Recent Labs  Lab 02/28/20 1254  GLUCAP 146*   Lipid Profile: No results for input(s): CHOL, HDL, LDLCALC, TRIG, CHOLHDL, LDLDIRECT in the last 72 hours. Thyroid Function Tests: No results for input(s): TSH, T4TOTAL, FREET4, T3FREE, THYROIDAB in the last 72 hours. Anemia Panel: No results for input(s): VITAMINB12, FOLATE, FERRITIN, TIBC, IRON, RETICCTPCT in the last 72 hours. Urine analysis:    Component Value Date/Time   COLORURINE YELLOW (A) 02/28/2020 1537   APPEARANCEUR HAZY (A) 02/28/2020 1537   APPEARANCEUR Clear 05/01/2019 1131   LABSPEC 1.009 02/28/2020 1537   PHURINE 6.0 02/28/2020 1537   GLUCOSEU NEGATIVE 02/28/2020 1537   HGBUR NEGATIVE 02/28/2020 1537   BILIRUBINUR NEGATIVE 02/28/2020 1537   BILIRUBINUR Negative 05/01/2019 [place] 02/28/2020 1537   PROTEINUR 30 (A) 02/28/2020 1537   NITRITE NEGATIVE 02/28/2020 1537   LEUKOCYTESUR SMALL (A) 02/28/2020 1537    Radiological Exams on Admission: DG Chest Portable 1 View  Result Date: 02/28/2020 CLINICAL DATA:  Weakness for 2 weeks EXAM: PORTABLE CHEST 1 VIEW COMPARISON:  06/15/2018 FINDINGS: Cardiac shadow is stable. Aortic calcifications are again seen.  Lungs are well aerated bilaterally. Old rib fractures with healing are noted on the right. Underlying pleural thickening is seen related to the fractures. No focal infiltrate or effusion is noted. IMPRESSION: No acute abnormality noted. Electronically Signed   By: Inez Catalina M.D.   On: 02/28/2020 16:08    EKG: Independently reviewed.  Assessment/Plan Active Problems:   * No active hospital problems. * Acute blood loss anemia: etiology unclear, possible GI bleed vs plavix/aspirin use vs multiple myeloma reoccurrence. Will transfuse 1 unit of PRBCs. Will re-check H&H 4 hours post transfusion. Guaiac positive stool as per ER physician, Dr. Charna Archer.   Multiple myeloma: possibly in remission as per [name]'s son but has not seen any oncologist in a couple years. Unknown of what treatment [name] received in the past   Pancytopenia: etiology unclear, possibly secondary to MM. Will consult oncology.   Hypokalemia: KCl repleted. Will continue to monitor   Hx of alcohol abuse: in remission as per [name] Ethanol level pending   PVD: will hold plavix & aspirin secondary to acute blood loss anemia  HLD: will continue on home dose of statin  Hypothyroidism: will continue on home dose of levothyroxine   Peripheral neuropathy: will continue on home dose of amitriptyline   Chewing tobacco: cessation counseling  Memory problems: etiology unclear, mild cognitive impairment vs delirium vs dementia. Re-orient prn. Does not usually remember the day of the week or year. No hx of dementia as per [name]'s son   DVT prophylaxis: SCDs Code Status: Full  Family Communication: discussed [name]'s care w/ [name]'s son who was bedside and answered his questions  Disposition Plan:  Consults called:   Oncology, Dr. Rogue Bussing Admission status: inpatient    Wyvonnia Dusky MD [facility] Pager [phone]   If 7PM-7AM, please contact night-coverage [email or web address]   02/28/2020, 5:11 PM

## 2020-02-28 NOTE — ED Provider Notes (Signed)
[facility] Emergency Department Provider Note   ____________________________________________   First MD Initiated Contact with Patient 02/28/20 1500     (approximate)  I have reviewed the triage vital signs and the nursing notes.   HISTORY  Chief Complaint Weakness    HPI [name] is a 79 y.o. male with possible history of hypertension, CKD, multiple myeloma, DM, and dementia who presents to the ED for generalized weakness.  History is limited due to patient's baseline dementia as he has a very poor historian.  His son is at bedside and states patient has been dealing with worsening weakness and dizziness for about the past 2 weeks.  He has been less mobile than usual and will frequently feel lightheaded.  He has not had any fevers, cough, chest pain, shortness of breath, vomiting, diarrhea, abdominal pain, dysuria, or hematuria.  Patient's son also states that he has been increasingly confused even from his baseline.  He apparently has a history of alcohol abuse, but son states that he has not been drinking recently.        Past Medical History:  Diagnosis Date  . Arthritis   . Cancer ([place])    multiple myaloma  . Diabetes mellitus   . Dyspnea   . Goals of care, counseling/discussion 09/08/2017  . 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 Hypertension   . Hypothyroidism   . Neuropathy   . Peripheral vascular disease ([place])   . Type 2 diabetes, controlled, with neuropathy ([place]) 04/07/2015  . Venous stasis     Patient Active Problem List   Diagnosis Date Noted  . Essential hypertension 08/09/2019  . B12 deficiency 09/26/2017  . Multiple myeloma ([place]) 09/08/2017  . 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 Peripheral neuropathy 07/01/2017  . Hypothyroidism 04/08/2015  . Benign hypertensive renal disease 04/07/2015  . CKD stage 1 due to type 2 diabetes  mellitus ([place]) 04/07/2015  . Pancytopenia ([place]) 04/07/2015    Past Surgical History:  Procedure Laterality Date  . CATARACT EXTRACTION W/PHACO Right 10/25/2016   Procedure: CATARACT EXTRACTION PHACO AND INTRAOCULAR LENS PLACEMENT (IOC)  right eye complicated;  Surgeon: Ronnell Freshwater, MD;  Location: [place] [facility] [place];  Service: Ophthalmology;  Laterality: Right;  Right eye Diabetic - oral meds Vision blue  . FINGER SURGERY    . LOWER EXTREMITY ANGIOGRAPHY Left 08/28/2019   Procedure: LOWER EXTREMITY ANGIOGRAPHY;  Surgeon: Katha Cabal, MD;  Location: [facility];  Service: Cardiovascular;  Laterality: Left;  . LOWER EXTREMITY ANGIOGRAPHY Left 09/11/2019   Procedure: LOWER EXTREMITY ANGIOGRAPHY;  Surgeon: Katha Cabal, MD;  Location: [facility] [facility];  Service: Cardiovascular;  Laterality: Left;    Prior to Admission medications   Medication Sig Start Date End Date Taking? Authorizing Provider  acetaminophen (TYLENOL) 500 MG tablet Take 1,000 mg by mouth daily as needed for mild pain or moderate pain.     [provider]  amitriptyline (ELAVIL) 100 MG tablet TAKE 1 TABLET BY MOUTH AT BEDTIME 11/26/19   Johnson, Megan P, DO  atorvastatin (LIPITOR) 10 MG tablet Take 1 tablet (10 mg total) by mouth daily. Additional refills per the patient's primary medical provider 09/11/19 11/10/19  Schnier, Dolores Lory, MD  Benzocaine (ORAL GEL ANESTHETIC MT) Use as directed 1 tablet in the mouth or throat daily as needed (Toothache).    [provider]  clopidogrel (  PLAVIX) 75 MG tablet Take 1 tablet by mouth once daily 02/15/20   Schnier, Dolores Lory, MD  ibuprofen (ADVIL) 100 MG tablet Take 100 mg by mouth 3 (three) times daily.    [provider]  levothyroxine (SYNTHROID) 25 MCG tablet Take 1 tablet (25 mcg total) by mouth daily before breakfast. 06/29/19   Park Liter P, DO  Multiple Vitamins-Minerals (MULTIVITAMIN WITH MINERALS) tablet Take 1  tablet by mouth daily.    [provider]  OVER THE COUNTER MEDICATION Use as directed 1 application in the mouth or throat daily as needed (Toothache). curoxen mouth sore treatment    [provider]    Allergies Metformin and related  Family History  Problem Relation Age of Onset  . Heart disease Mother   . Alzheimer's disease Sister   . Heart disease Maternal Aunt     Social History Social History   Tobacco Use  . Smoking status: Former Smoker    Packs/day: 0.25    Types: Cigarettes    Quit date: 03/23/2015    Years since quitting: 4.9  . Smokeless tobacco: Current User    Types: Chew  Substance Use Topics  . Alcohol use: Not Currently    Alcohol/week: 42.0 standard drinks    Types: 42 Cans of beer per week    Comment: not currently  . Drug use: No    Review of Systems  Constitutional: No fever/chills.  Positive for generalized weakness and dizziness. Eyes: No visual changes. ENT: No sore throat. Cardiovascular: Denies chest pain. Respiratory: Denies shortness of breath. Gastrointestinal: No abdominal pain.  No nausea, no vomiting.  No diarrhea.  No constipation. Genitourinary: Negative for dysuria. Musculoskeletal: Negative for back pain. Skin: Negative for rash. Neurological: Negative for headaches, focal weakness or numbness.  ____________________________________________   PHYSICAL EXAM:  VITAL SIGNS: ED Triage Vitals  Enc Vitals Group     BP 02/28/20 1247 (!) 141/47     Pulse Rate 02/28/20 1247 81     Resp 02/28/20 1247 18     Temp 02/28/20 1247 98.3 F (36.8 C)     Temp Source 02/28/20 1246 Oral     SpO2 02/28/20 1247 99 %     Weight 02/28/20 1245 176 lb 5.9 oz (80 kg)     Height 02/28/20 1245 '5\' 11"'  (1.803 m)     Head Circumference --      Peak Flow --      Pain Score 02/28/20 1253 0     Pain Loc --      Pain Edu? --      Excl. in [place]? --     Constitutional: Awake and alert. Eyes: Conjunctivae are normal. Head:  Atraumatic. Nose: No congestion/rhinnorhea. Mouth/Throat: Mucous membranes are moist. Neck: Normal ROM Cardiovascular: Normal rate, regular rhythm. Grossly normal heart sounds. Respiratory: Normal respiratory effort.  No retractions. Lungs CTAB. Gastrointestinal: Soft and nontender. No distention.  Guaiac positive brown stool. Genitourinary: deferred Musculoskeletal: No lower extremity tenderness nor edema. Neurologic:  Normal speech and language. No gross focal neurologic deficits are appreciated. Skin:  Skin is warm, dry and intact. No rash noted. Psychiatric: Mood and affect are normal. Speech and behavior are normal.  ____________________________________________   LABS (all labs ordered are listed, but only abnormal results are displayed)  Labs Reviewed  BASIC METABOLIC PANEL - Abnormal; Notable for the following components:      Result Value   Potassium 3.4 (*)    Glucose, Bld 163 (*)  BUN 6 (*)    Calcium 8.4 (*)    All other components within normal limits  CBC - Abnormal; Notable for the following components:   WBC 1.6 (*)    RBC 2.00 (*)    Hemoglobin 6.6 (*)    HCT 20.3 (*)    MCV 101.5 (*)    RDW 16.9 (*)    Platelets 74 (*)    All other components within normal limits  URINALYSIS, COMPLETE (UACMP) WITH MICROSCOPIC - Abnormal; Notable for the following components:   Color, Urine YELLOW (*)    APPearance HAZY (*)    Protein, ur 30 (*)    Leukocytes,Ua SMALL (*)    All other components within normal limits  GLUCOSE, CAPILLARY - Abnormal; Notable for the following components:   Glucose-Capillary 146 (*)    All other components within normal limits  HEPATIC FUNCTION PANEL - Abnormal; Notable for the following components:   Total Protein 5.7 (*)    AST 69 (*)    All other components within normal limits  SARS CORONAVIRUS 2 BY RT PCR (HOSPITAL ORDER, [facility])  PROTIME-INR  ETHANOL  CBG MONITORING, ED  TYPE AND SCREEN  PREPARE  RBC (CROSSMATCH)   ____________________________________________  EKG  ED ECG REPORT I, Blake Divine, the attending physician, personally viewed and interpreted this ECG.   Date: 02/28/2020  EKG Time: 12:55  Rate: 75  Rhythm: normal sinus rhythm  Axis: LAD  Intervals:nonspecific intraventricular conduction delay  ST&T Change: None   PROCEDURES  Procedure(s) performed (including Critical Care):  .Critical Care Performed by: Blake Divine, MD Authorized by: Blake Divine, MD   Critical care provider statement:    Critical care time (minutes):  45   Critical care time was exclusive of:  Separately billable procedures and treating other patients and teaching time   Critical care was necessary to treat or prevent imminent or life-threatening deterioration of the following conditions:  Circulatory failure   Critical care was time spent personally by me on the following activities:  Discussions with consultants, evaluation of patient's response to treatment, examination of patient, ordering and performing treatments and interventions, ordering and review of laboratory studies, ordering and review of radiographic studies, pulse oximetry, re-evaluation of patient's condition, obtaining history from patient or surrogate and review of old charts   I assumed direction of critical care for this patient from another provider in my specialty: no       ____________________________________________   INITIAL IMPRESSION / ASSESSMENT AND PLAN / ED COURSE       79 year old male with history of dementia, hypertension, CKD, and multiple myeloma presents to the ED for generalized weakness and lightheadedness increasing over the past 2 weeks.  Patient is awake and alert on my evaluation with no focal neurologic deficits.  He is hemodynamically stable and there is no apparent infectious process.  EKG shows no evidence of arrhythmia or ischemia, however hemoglobin noted to be 6.6.  I suspect his  symptoms are related to symptomatic anemia and he has guaiac positive brown stool on rectal exam.  He seems to be having a slow lower GI bleed given he remains hemodynamically stable with symptoms increasing over 2 weeks.  Family was initially hesitant to undergo blood transfusion, however with further explanation of risks and benefits, his son agreed to proceed.  Case was discussed with hospitalist for admission.      ____________________________________________   FINAL CLINICAL IMPRESSION(S) / ED DIAGNOSES  Final diagnoses:  Lower GI bleed  Symptomatic anemia  Thrombocytopenia [facility])     ED Discharge Orders    None       Note:  This document was prepared using Dragon voice recognition software and may include unintentional dictation errors.   Blake Divine, MD 02/28/20 1714

## 2020-02-28 NOTE — ED Triage Notes (Signed)
Pt comes via POV from home with c/o dizziness and weakness for couple of weeks. Pt is diabetic and has not checked his CBG in over several months.  Pt arrives A*OX4. Pt states weakness in his legs. Pt denies any CP or SOB.

## 2020-02-28 NOTE — ED Notes (Signed)
Messaged pharmacy regarding unverified meds

## 2020-02-28 NOTE — ED Notes (Signed)
attempted to call report

## 2020-02-29 ENCOUNTER — Encounter: Payer: Self-pay | Admitting: Internal Medicine

## 2020-02-29 DIAGNOSIS — C9002 Multiple myeloma in relapse: Secondary | ICD-10-CM

## 2020-02-29 DIAGNOSIS — Z87891 Personal history of nicotine dependence: Secondary | ICD-10-CM

## 2020-02-29 DIAGNOSIS — F0390 Unspecified dementia without behavioral disturbance: Secondary | ICD-10-CM

## 2020-02-29 DIAGNOSIS — D61818 Other pancytopenia: Secondary | ICD-10-CM

## 2020-02-29 DIAGNOSIS — Z515 Encounter for palliative care: Secondary | ICD-10-CM

## 2020-02-29 DIAGNOSIS — D62 Acute posthemorrhagic anemia: Principal | ICD-10-CM

## 2020-02-29 DIAGNOSIS — C9000 Multiple myeloma not having achieved remission: Secondary | ICD-10-CM

## 2020-02-29 DIAGNOSIS — K922 Gastrointestinal hemorrhage, unspecified: Secondary | ICD-10-CM

## 2020-02-29 LAB — CBC WITH DIFFERENTIAL/PLATELET
Abs Immature Granulocytes: 0 10*3/uL (ref 0.00–0.07)
Basophils Absolute: 0 10*3/uL (ref 0.0–0.1)
Basophils Relative: 0 %
Eosinophils Absolute: 0 10*3/uL (ref 0.0–0.5)
Eosinophils Relative: 3 %
HCT: 21.5 % — ABNORMAL LOW (ref 39.0–52.0)
Hemoglobin: 7.1 g/dL — ABNORMAL LOW (ref 13.0–17.0)
Immature Granulocytes: 0 %
Lymphocytes Relative: 27 %
Lymphs Abs: 0.4 10*3/uL — ABNORMAL LOW (ref 0.7–4.0)
MCH: 32.3 pg (ref 26.0–34.0)
MCHC: 33 g/dL (ref 30.0–36.0)
MCV: 97.7 fL (ref 80.0–100.0)
Monocytes Absolute: 0.1 10*3/uL (ref 0.1–1.0)
Monocytes Relative: 5 %
Neutro Abs: 1 10*3/uL — ABNORMAL LOW (ref 1.7–7.7)
Neutrophils Relative %: 65 %
Platelets: 64 10*3/uL — ABNORMAL LOW (ref 150–400)
RBC: 2.2 MIL/uL — ABNORMAL LOW (ref 4.22–5.81)
RDW: 18.4 % — ABNORMAL HIGH (ref 11.5–15.5)
WBC: 1.5 10*3/uL — ABNORMAL LOW (ref 4.0–10.5)
nRBC: 0 % (ref 0.0–0.2)

## 2020-02-29 LAB — URINE CULTURE

## 2020-02-29 LAB — BASIC METABOLIC PANEL
Anion gap: 7 (ref 5–15)
BUN: 6 mg/dL — ABNORMAL LOW (ref 8–23)
CO2: 25 mmol/L (ref 22–32)
Calcium: 8.4 mg/dL — ABNORMAL LOW (ref 8.9–10.3)
Chloride: 107 mmol/L (ref 98–111)
Creatinine, Ser: 0.69 mg/dL (ref 0.61–1.24)
GFR calc Af Amer: >60 mL/min (ref >60)
GFR calc non Af Amer: >60 mL/min (ref >60)
Glucose, Bld: 127 mg/dL — ABNORMAL HIGH (ref 70–99)
Potassium: 3.8 mmol/L (ref 3.5–5.1)
Sodium: 139 mmol/L (ref 135–145)

## 2020-02-29 LAB — TYPE AND SCREEN
ABO/RH(D): A NEG
Antibody Screen: NEGATIVE
Unit division: 0

## 2020-02-29 LAB — CBC
HCT: 22.4 % — ABNORMAL LOW (ref 39.0–52.0)
Hemoglobin: 7.4 g/dL — ABNORMAL LOW (ref 13.0–17.0)
MCH: 32.5 pg (ref 26.0–34.0)
MCHC: 33 g/dL (ref 30.0–36.0)
MCV: 98.2 fL (ref 80.0–100.0)
Platelets: 71 10*3/uL — ABNORMAL LOW (ref 150–400)
RBC: 2.28 MIL/uL — ABNORMAL LOW (ref 4.22–5.81)
RDW: 18.6 % — ABNORMAL HIGH (ref 11.5–15.5)
WBC: 1.7 10*3/uL — ABNORMAL LOW (ref 4.0–10.5)
nRBC: 0 % (ref 0.0–0.2)

## 2020-02-29 LAB — BPAM RBC
Blood Product Expiration Date: 202106032359
ISSUE DATE / TIME: 202105201745
Unit Type and Rh: 600

## 2020-02-29 MED ORDER — OXYCODONE HCL 5 MG PO TABS: 5.0000 mg | ORAL_TABLET | ORAL | 0 refills | Status: AC | PRN
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
## 2020-02-29 NOTE — Consult Note (Signed)
[place]  [phone]) [phone] [phone]) [phone]  ID: [name] OB: 1941/01/24  MR#: [id number]  QIW#:[id number]  Patient Care Team: Valerie Roys, DO as PCP - General (Family Medicine) Minor, Dalbert Garnet, RN (Inactive) as Case Manager De Hollingshead, [facility] as Pharmacist (Pharmacist) Greg Cutter, LCSW as Social Worker (Licensed Clinical Social Worker)  CHIEF COMPLAINT: History of multiple myeloma, dementia, now with GI bleed.  INTERVAL HISTORY: Patient is a 79 year old male who was last evaluated in the cancer center in August 2019.  At that time he was under treatment for multiple myeloma.  Patient was subsequently lost to follow-up canceling and/or skipping multiple appointments in the interim.  He recently presented to the emergency room from his nursing home with coffee-ground emesis and was found to be pancytopenic.  Patient is currently nonverbal and review of systems is unobtainable.  No family members at bedside.  REVIEW OF SYSTEMS:   Review of Systems  Unable to perform ROS: Dementia    PAST MEDICAL HISTORY: Past Medical History:  Diagnosis Date  . Arthritis   . Cancer ([place])    multiple myaloma  . Diabetes mellitus   . Dyspnea   . Goals of care, counseling/discussion 09/08/2017  . 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 Hypertension   . Hypothyroidism   . Neuropathy   . Peripheral vascular disease ([place])   . Type 2 diabetes, controlled, with neuropathy ([place]) 04/07/2015  . Venous stasis     PAST SURGICAL HISTORY: Past Surgical History:  Procedure Laterality Date  . CATARACT EXTRACTION W/PHACO Right 10/25/2016   Procedure: CATARACT EXTRACTION PHACO AND INTRAOCULAR LENS PLACEMENT (IOC)  right eye complicated;  Surgeon: Ronnell Freshwater, MD;  Location: [place];  Service: Ophthalmology;  Laterality: Right;  Right eye Diabetic - oral meds Vision blue  . FINGER SURGERY    . LOWER  EXTREMITY ANGIOGRAPHY Left 08/28/2019   Procedure: LOWER EXTREMITY ANGIOGRAPHY;  Surgeon: Katha Cabal, MD;  Location: [place] [facility];  Service: Cardiovascular;  Laterality: Left;  . LOWER EXTREMITY ANGIOGRAPHY Left 09/11/2019   Procedure: LOWER EXTREMITY ANGIOGRAPHY;  Surgeon: Katha Cabal, MD;  Location: [place] [facility];  Service: Cardiovascular;  Laterality: Left;    FAMILY HISTORY: Family History  Problem Relation Age of Onset  . Heart disease Mother   . Alzheimer's disease Sister   . Heart disease Maternal Aunt     ADVANCED DIRECTIVES (Y/N):  '@ADVDIR' @  HEALTH MAINTENANCE: Social History   Tobacco Use  . Smoking status: Former Smoker    Packs/day: 0.25    Types: Cigarettes    Quit date: 03/23/2015    Years since quitting: 4.9  . Smokeless tobacco: Current User    Types: Chew  Substance Use Topics  . Alcohol use: Not Currently    Alcohol/week: 42.0 standard drinks    Types: 42 Cans of beer per week    Comment: not currently  . Drug use: No     Colonoscopy:  PAP:  Bone density:  Lipid panel:  Allergies  Allergen Reactions  . Metformin And Related Nausea And Vomiting    Current Facility-Administered Medications  Medication Dose Route Frequency Provider Last Rate Last Admin  . 0.9 %  sodium chloride infusion (Manually program via Guardrails IV Fluids)   Intravenous Once Blake Divine, MD   Stopped at 02/28/20 1758  . acetaminophen (TYLENOL) tablet 650 mg  650 mg Oral Q6H  PRN Wyvonnia Dusky, MD       Or  . acetaminophen (TYLENOL) suppository 650 mg  650 mg Rectal Q6H PRN Wyvonnia Dusky, MD      . amitriptyline (ELAVIL) tablet 100 mg  100 mg Oral QHS Wyvonnia Dusky, MD   100 mg at 02/28/20 2205  . atorvastatin (LIPITOR) tablet 10 mg  10 mg Oral Daily Wyvonnia Dusky, MD   10 mg at 02/28/20 2105  . bisacodyl (DULCOLAX) EC tablet 5 mg  5 mg Oral Daily PRN Wyvonnia Dusky, MD      . levothyroxine (SYNTHROID) tablet 25 mcg   25 mcg Oral Q0600 Wyvonnia Dusky, MD      . ondansetron [facility]) tablet 4 mg  4 mg Oral Q6H PRN Wyvonnia Dusky, MD       Or  . ondansetron [facility]) injection 4 mg  4 mg Intravenous Q6H PRN Wyvonnia Dusky, MD      . oxyCODONE (Oxy IR/ROXICODONE) immediate release tablet 5 mg  5 mg Oral Q4H PRN Wyvonnia Dusky, MD      . sodium chloride flush (NS) 0.9 % injection 3 mL  3 mL Intravenous Once Blake Divine, MD      . sodium chloride flush (NS) 0.9 % injection 3 mL  3 mL Intravenous Q12H Wyvonnia Dusky, MD   3 mL at 02/28/20 2134    OBJECTIVE: Vitals:   02/29/20 0536 02/29/20 0841  BP: (!) 113/52 125/75  Pulse: 72   Resp: 13 18  Temp: 97.9 F (36.6 C) 98.2 F (36.8 C)  SpO2: 97% 97%     Body mass index is 24.6 kg/m.    ECOG FS:4 - Bedbound  General: Disheveled appearing, no acute distress. Eyes: Pink conjunctiva, anicteric sclera. HEENT: Normocephalic, moist mucous membranes. Lungs: No audible wheezing or coughing. Heart: Regular rate and rhythm. Abdomen: Soft, nontender, no obvious distention. Musculoskeletal: No edema, cyanosis, or clubbing. Neuro: Awake, not answering questions. Cranial nerves grossly intact. Skin: No rashes or petechiae noted. Psych: No affect.   LAB RESULTS:  Lab Results  Component Value Date   NA 139 02/29/2020   K 3.8 02/29/2020   CL 107 02/29/2020   CO2 25 02/29/2020   GLUCOSE 127 (H) 02/29/2020   BUN 6 (L) 02/29/2020   CREATININE 0.69 02/29/2020   CALCIUM 8.4 (L) 02/29/2020   PROT 5.7 (L) 02/28/2020   ALBUMIN 3.5 02/28/2020   AST 69 (H) 02/28/2020   ALT 32 02/28/2020   ALKPHOS 114 02/28/2020   BILITOT 0.9 02/28/2020   GFRNONAA >60 02/29/2020   GFRAA >60 02/29/2020    Lab Results  Component Value Date   WBC 1.5 (L) 02/29/2020   NEUTROABS 1.0 (L) 02/29/2020   HGB 7.1 (L) 02/29/2020   HCT 21.5 (L) 02/29/2020   MCV 97.7 02/29/2020   PLT 64 (L) 02/29/2020     STUDIES: DG Chest Portable 1 View  Result Date:  02/28/2020 CLINICAL DATA:  Weakness for 2 weeks EXAM: PORTABLE CHEST 1 VIEW COMPARISON:  06/15/2018 FINDINGS: Cardiac shadow is stable. Aortic calcifications are again seen. Lungs are well aerated bilaterally. Old rib fractures with healing are noted on the right. Underlying pleural thickening is seen related to the fractures. No focal infiltrate or effusion is noted. IMPRESSION: No acute abnormality noted. Electronically Signed   By: Inez Catalina M.D.   On: 02/28/2020 16:08   VAS [place] ABI WITH/WO TBI  Result Date: 02/04/2020 LOWER EXTREMITY DOPPLER STUDY Indications:  Peripheral artery disease.  Vascular Interventions: 09/30/2019 Left SFA stent. Comparison Study: 01/01/2020 Performing Technologist: Concha Norway RVT  Examination Guidelines: A complete evaluation includes at minimum, Doppler waveform signals and systolic blood pressure reading at the level of bilateral brachial, anterior tibial, and posterior tibial arteries, when vessel segments are accessible. Bilateral testing is considered an integral part of a complete examination. Photoelectric Plethysmograph (PPG) waveforms and toe systolic pressure readings are included as required and additional duplex testing as needed. Limited examinations for reoccurring indications may be performed as noted.  ABI Findings: +---------+------------------+-----+---------+--------+ Right    Rt Pressure (mmHg)IndexWaveform Comment  +---------+------------------+-----+---------+--------+ Brachial 156                                      +---------+------------------+-----+---------+--------+ ATA      168               1.08 triphasic         +---------+------------------+-----+---------+--------+ PTA      178               1.14 triphasic         +---------+------------------+-----+---------+--------+ Great Toe132               0.85 Normal            +---------+------------------+-----+---------+--------+  +---------+------------------+-----+---------+-------+ Left     Lt Pressure (mmHg)IndexWaveform Comment +---------+------------------+-----+---------+-------+ Brachial 150                                     +---------+------------------+-----+---------+-------+ ATA      152               0.97 triphasic        +---------+------------------+-----+---------+-------+ PTA      164               1.05 triphasic        +---------+------------------+-----+---------+-------+ Great Toe139               0.89 Normal           +---------+------------------+-----+---------+-------+ +-------+-----------+-----------+------------+------------+ ABI/TBIToday's ABIToday's TBIPrevious ABIPrevious TBI +-------+-----------+-----------+------------+------------+ Right  1.14       .85        .93         .70          +-------+-----------+-----------+------------+------------+ Left   1.05       .89        .99         .85          +-------+-----------+-----------+------------+------------+ Bilateral ABIs appear increased compared to prior study on 01/01/2020.  Summary: Right: Resting right ankle-brachial index is within normal range. No evidence of significant right lower extremity arterial disease. The right toe-brachial index is normal. Left: Resting left ankle-brachial index is within normal range. No evidence of significant left lower extremity arterial disease. The left toe-brachial index is normal.  *See table(s) above for measurements and observations.  Electronically signed by Hortencia Pilar MD on 02/04/2020 at 5:29:44 PM.    Final     ASSESSMENT: History of multiple myeloma, dementia, now with GI bleed.  PLAN:    1.  Multiple myeloma: Bone marrow biopsy on March 28, 2018 revealed 23 to 24% plasma cells with kappa restriction, which is only slightly decreased from previous when it was reported patient to  have 29% plasma cells.  He has normal cytogenetics.  Patient never had a  metastatic bone survey skipping multiple schedule appointments previously.  Previously, patient was treated with Cytoxan and Velcade, but Cytoxan was discontinued and he received 6 cycles of single agent Velcade last received treatment on Feb 23, 2018.  Patient has not been seen in clinic since August 2019 at which time he was subsequently lost to follow-up.  SPEP, immunoglobulins, and kappa/lambda light chains are pending at time of dictation.  No bone marrow biopsy is necessary.  If patient's pancytopenia secondary to progressive disease, no treatment would be advisable given his underlying dementia and poor performance status.  Agree with palliative care consult and consideration of hospice.  2.  Pancytopenia: Upon review of the chart, patient has been pancytopenic since at least July 2020.  Likely multifactorial secondary to possible progressive myeloma, GI bleed, and poor nutritional status.  Patient does not require transfusion at this time.  Continue supportive care as ordered. 3.  Dementia: Patient and son are both known to be poor historians.  Consent for any procedure or treatment may be difficult. 4.  GI bleed: Unclear if GI has been consulted.  Appreciate consult, will follow.   Lloyd Huger, MD   02/29/2020 9:38 AM

## 2020-02-29 NOTE — TOC Initial Note (Signed)
Transition of Care [facility]) - Initial/Assessment Note    Patient Details  Name: [name] MRN: [id number] Date of Birth: 1941-04-12  Transition of Care [facility]) CM/SW Contact:    Shelbie Ammons, RN Phone Number: 02/29/2020, 1:49 PM  Clinical Narrative:      RNCM received referral for assistance with setting up home with Hospice. RNCM reached out to patient's son [name] to discuss. [name] reports that he is aware of the recommendation and is in agreement. [name] reports that his father had Hospice services about a year ago but can't remember what agency that was through, he does relay that he would prefer to have that agency again.  RNCM reached out to Santiago Glad with [facility] and she relayed that they had him open up until July of last year. She will follow up with the son.            Expected Discharge Plan: Home w Hospice Care Barriers to Discharge: No Barriers Identified   Patient Goals and CMS Choice        Expected Discharge Plan and Services Expected Discharge Plan: [place]   Discharge Planning Services: CM Consult   Living arrangements for the past 2 months: Single Family Home                                      Prior Living Arrangements/Services Living arrangements for the past 2 months: Single Family Home Lives with:: Adult Children Patient language and need for interpreter reviewed:: Yes        Need for Family Participation in Patient Care: Yes (Comment) Care giver support system in place?: Yes (comment)   Criminal Activity/Legal Involvement Pertinent to Current Situation/Hospitalization: No - Comment as needed  Activities of Daily Living   ADL Screening (condition at time of admission) Patient's cognitive ability adequate to safely complete daily activities?: No Is the patient deaf or have difficulty hearing?: No Does the patient have difficulty seeing, even when wearing glasses/contacts?: No Does the patient have difficulty concentrating,  remembering, or making decisions?: Yes Patient able to express need for assistance with ADLs?: No  Permission Sought/Granted Permission sought to share information with : Family Supports    Share Information with NAME: [name]           Emotional Assessment              Admission diagnosis:  Acute blood loss anemia [D62] Thrombocytopenia (HCC) [D69.6] Lower GI bleed [K92.2] Symptomatic anemia [D64.9] Patient Active Problem List   Diagnosis Date Noted  . Palliative care encounter   . Acute blood loss anemia 02/28/2020  . Essential hypertension 08/09/2019  . B12 deficiency 09/26/2017  . Multiple myeloma ([place]) 09/08/2017  . 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 Peripheral neuropathy 07/01/2017  . Hypothyroidism 04/08/2015  . Benign hypertensive renal disease 04/07/2015  . 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 Pancytopenia ([place]) 04/07/2015   PCP:  Valerie Roys, DO Pharmacy:   [facility] [address], [place] - [place] [address] [place] [address] Phone: [phone] Fax: [facility] [place], [place] [address] Phone: [phone] Fax: [phone]     Social Determinants of Health (SDOH) Interventions    Readmission Risk Interventions No flowsheet  data found.

## 2020-03-01 LAB — IGG, IGA, IGM
IgA: 9 mg/dL — ABNORMAL LOW (ref 61–437)
IgG (Immunoglobin G), Serum: 423 mg/dL — ABNORMAL LOW (ref 603–1613)
IgM (Immunoglobulin M), Srm: 6 mg/dL — ABNORMAL LOW (ref 15–143)

## 2020-03-02 NOTE — Progress Notes (Signed)
[place] Room [facility]) Hospital Liaison RN note  Notified by Altha Harm, NP with PMT for family patient/family interest in [facility] services at discharge. Hospice eligibility has been confirmed by [facility] physician.   Spoke with patient and son at bedside to initiate education related to hospice philosophy, services and team approach to care. Patient and son verbalized understanding of information given. Per discussion, plan is for discharge home this afternoon by private car.  Please send signed and completed out of facility DNR form home with patient/family.  No DME needs identified at this time per son and patient.  [facility] aware of the above. TOC notified of the above information.  Please call with any hospice related questions or concerns.  Thank you.  Margaretmary Eddy, BSN, RN [facility] Liaison [phone]

## 2020-03-03 ENCOUNTER — Telehealth: Payer: Self-pay

## 2020-03-03 LAB — PROTEIN ELECTROPHORESIS, SERUM
A/G Ratio: 1.6 (ref 0.7–1.7)
Albumin ELP: 3.2 g/dL (ref 2.9–4.4)
Alpha-1-Globulin: 0.3 g/dL (ref 0.0–0.4)
Alpha-2-Globulin: 0.5 g/dL (ref 0.4–1.0)
Beta Globulin: 0.9 g/dL (ref 0.7–1.3)
Gamma Globulin: 0.3 g/dL — ABNORMAL LOW (ref 0.4–1.8)
Globulin, Total: 2 g/dL — ABNORMAL LOW (ref 2.2–3.9)
Total Protein ELP: 5.2 g/dL — ABNORMAL LOW (ref 6.0–8.5)

## 2020-03-03 LAB — KAPPA/LAMBDA LIGHT CHAINS
Kappa free light chain: 2288.7 mg/L — ABNORMAL HIGH (ref 3.3–19.4)
Kappa, lambda light chain ratio: 762.9 — ABNORMAL HIGH (ref 0.26–1.65)
Lambda free light chains: 3 mg/L — ABNORMAL LOW (ref 5.7–26.3)

## 2020-03-03 NOTE — Telephone Encounter (Signed)
Transition Care Management Follow-up Telephone Call  Spoke with patients sonKerry Dory    Date of discharge and from where: [facility] 02/29/2020  How have you been since you were released from the hospital? "he is doing better"  Any questions or concerns? No   Items Reviewed:  Did the pt receive and understand the discharge instructions provided? Yes   Medications obtained and verified? Yes   Any new allergies since your discharge? No   Dietary orders reviewed? Yes  Do you have support at home? Yes   Functional Questionnaire: (I = Independent and D = Dependent) ADLs: i  Bathing/Dressing- i  Meal Prep- i  Eating- i  Maintaining continence- i  Transferring/Ambulation- i  Managing Meds- i  Follow up appointments reviewed:   PCP Hospital f/u appt confirmed? Yes  Scheduled to see Dr.Johnson  on 11/12/2019 @ 230pm.  [facility] f/u appt confirmed? Yes  will follow up wit hospice.   Are transportation arrangements needed? No   If their condition worsens, is the pt aware to call PCP or go to the Emergency Dept.? Yes  Was the patient provided with contact information for the PCP's office or ED? Yes  Was to pt encouraged to call back with questions or concerns? Yes

## 2020-03-11 ENCOUNTER — Inpatient Hospital Stay: Payer: Medicare Other | Admitting: Family Medicine

## 2020-03-12 ENCOUNTER — Ambulatory Visit (INDEPENDENT_AMBULATORY_CARE_PROVIDER_SITE_OTHER): Payer: Medicare Other | Admitting: Family Medicine

## 2020-03-12 ENCOUNTER — Other Ambulatory Visit: Payer: Self-pay

## 2020-03-12 ENCOUNTER — Encounter: Payer: Self-pay | Admitting: Family Medicine

## 2020-03-12 VITALS — BP 148/55 | HR 100 | Temp 98.5°F | Ht 67.0 in | Wt 179.4 lb

## 2020-03-12 DIAGNOSIS — E538 Deficiency of other specified B group vitamins: Secondary | ICD-10-CM

## 2020-03-12 DIAGNOSIS — C9000 Multiple myeloma not having achieved remission: Secondary | ICD-10-CM

## 2020-03-12 DIAGNOSIS — D62 Acute posthemorrhagic anemia: Secondary | ICD-10-CM | POA: Diagnosis not present

## 2020-03-12 DIAGNOSIS — Z515 Encounter for palliative care: Secondary | ICD-10-CM

## 2020-03-12 MED ORDER — MECLIZINE HCL 25 MG PO TABS: 25.0000 mg | ORAL_TABLET | Freq: Three times a day (TID) | ORAL | 6 refills | Status: AC | PRN

## 2020-03-12 MED ORDER — IRON 325 (65 FE) MG PO TABS: ORAL_TABLET | ORAL | 6 refills | Status: AC

## 2020-03-12 NOTE — Progress Notes (Signed)
BP (!) 148/55 (BP Location: Left Arm, Patient Position: Sitting, Cuff Size: Normal)   Pulse 100   Temp 98.5 F (36.9 C) (Oral)   Ht _0  (1.702 m)   Wt 179 lb 6.4 oz (81.4 kg)   SpO2 96%   BMI 28.10 kg/m    Subjective:    Patient ID: [name], male    DOB: [birth year], 79 y.o.   MRN: [id number]  HPI: [name] is a 79 y.o. male  Chief Complaint  Patient presents with  . Hospitalization Follow-up    dizziness, weakness, numbness   Transition of Care Hospital Follow up.   Hospital/Facility: [facility] D/C Physician: Dr. Jimmye Norman D/C Date: 02/29/20  Records Requested: 03/12/20 Records Received: 03/12/20 Records Reviewed: 03/12/20  Diagnoses on Discharge: Acute blood loss anemia  Date of interactive Contact within 48 hours of discharge: 03/03/20 Contact was through: phone  Date of 7 day or 14 day face-to-face visit:  03/12/20  within 14 days  Outpatient Encounter Medications as of 03/12/2020  Medication Sig  . amitriptyline (ELAVIL) 100 MG tablet TAKE 1 TABLET BY MOUTH AT BEDTIME  . Multiple Vitamins-Minerals (MULTIVITAMIN WITH MINERALS) tablet Take 1 tablet by mouth daily.  Marland Kitchen acetaminophen (TYLENOL) 500 MG tablet Take 1,000 mg by mouth daily as needed for mild pain or moderate pain.   . Benzocaine (ORAL GEL ANESTHETIC MT) Use as directed 1 tablet in the mouth or throat daily as needed (Toothache).  . Ferrous Sulfate (IRON) 325 (65 Fe) MG TABS 1 tab 1-2x a day depending on constipation  . levothyroxine (EUTHYROX) 25 MCG tablet Take 25 mcg by mouth daily before breakfast.  . meclizine (ANTIVERT) 25 MG tablet Take 1 tablet (25 mg total) by mouth 3 (three) times daily as needed for dizziness.  Marland Kitchen OVER THE COUNTER MEDICATION Use as directed 1 application in the mouth or throat daily as needed (Toothache). curoxen mouth sore treatment   Facility-Administered Encounter Medications as of 03/12/2020  Medication  . cyanocobalamin ((VITAMIN B-12)) injection 1,000 mcg  Per Hospitalist:  "79 y/o M w/ PMH of multiple myeloma (possibly in remission), peripheral neuropathy, PVD, DVTs, HLD, hypothyroidism, chronic cough, ethanol abuse in remission (as per pt)who presents w/ dizziness &feeling cold x 2 days. Hx was obtained from pt and pt's son who is at bedside, both are poor historians. Pt states he feels like he is drunk when he walks. Pt denies any hemoptysis, hematemesis or melena. Pt does not know if ever has had an EGD or colonoscopy in the past. Pt uses NSAIDs rarely but has been taking aspirin more recently and pt takes plavix daily as well for stent in his leg. Pt rarely goes to the doctor. Pt denies any fever, sweating, chest pain, shortness of breath, nausea, vomiting, abd pain, dysuria, urinary urgency, urinary frequency, diarrhea, or constipation. Of note, pt has hx of chronic cough as pt is a former smoker but chews tobacco often. Also, pt has recently had memory problems as per pt's son but denies any hx of dementia. Pt does not usually know which day of the week it is or the year as per pt's son.  Pt was given 1 unit of pRBCs for anemia likely secondary to progression of multiple myeloma. Pt was found to be pancytopenic. Oncology was consulted and stated pt is not a treatment candidate w/ hx of poor compliance w/ previous treatment and underlying dementia. Palliative care/hospice was consulted and pt's family decided to proceed w/ home w/ hospice.  Home hospice w/ set up by CM."   Diagnostic Tests Reviewed:  CLINICAL DATA:  Weakness for 2 weeks  EXAM: PORTABLE CHEST 1 VIEW  COMPARISON:  06/15/2018  FINDINGS: Cardiac shadow is stable. Aortic calcifications are again seen. Lungs are well aerated bilaterally. Old rib fractures with healing are noted on the right. Underlying pleural thickening is seen related to the fractures. No focal infiltrate or effusion is noted.  IMPRESSION: No acute abnormality noted.  Disposition: Home with Hospice  Consults: Oncology,  palliative care/Hospice  Discharge Instructions:  Follow up with hospice  Disease/illness Education: Discussed today  Home Health/Community Services Discussions/Referrals: In place  Establishment or re-establishment of referral orders for community resources: In place  Discussion with other health care providers:  Dr. Grayland Ormond  Assessment and Support of treatment regimen adherence: Poor  Appointments Coordinated with: Patient and Son  Education for self-management, independent living, and ADLs:  Discussed today  Since getting out of the hospital, [name] has been feeling OK. He notes that he continues to be very tired and has been getting out of breath more quickly. He notes that he has hospice coming out and that has been helpful. He is otherwise doing OK with no other concerns or complaints or concerns at this time.   Relevant past medical, surgical, family and social history reviewed and updated as indicated. Interim medical history since our last visit reviewed. Allergies and medications reviewed and updated.  Review of Systems  Constitutional: Positive for fatigue. Negative for activity change, appetite change, chills, diaphoresis, fever and unexpected weight change.  HENT: Negative.   Respiratory: Positive for shortness of breath. Negative for apnea, cough, choking, chest tightness, wheezing and stridor.   Cardiovascular: Negative.   Gastrointestinal: Positive for nausea. Negative for abdominal distention, abdominal pain, anal bleeding, blood in stool, constipation, diarrhea, rectal pain and vomiting.  Neurological: Positive for dizziness and light-headedness. Negative for tremors, seizures, syncope, facial asymmetry, speech difficulty, weakness, numbness and headaches.  Hematological: Negative.   Psychiatric/Behavioral: Negative.     Per HPI unless specifically indicated above     Objective:    BP (!) 148/55 (BP Location: Left Arm, Patient Position: Sitting, Cuff Size:  Normal)   Pulse 100   Temp 98.5 F (36.9 C) (Oral)   Ht _0  (1.702 m)   Wt 179 lb 6.4 oz (81.4 kg)   SpO2 96%   BMI 28.10 kg/m   Wt Readings from Last 3 Encounters:  03/12/20 179 lb 6.4 oz (81.4 kg)  02/28/20 176 lb 5.9 oz (80 kg)  01/31/20 178 lb (80.7 kg)    Physical Exam Vitals and nursing note reviewed.  Constitutional:      General: He is not in acute distress.    Appearance: Normal appearance. He is normal weight. He is ill-appearing. He is not toxic-appearing or diaphoretic.  HENT:     Head: Normocephalic and atraumatic.     Right Ear: External ear normal.     Left Ear: External ear normal.     Nose: Nose normal.     Mouth/Throat:     Mouth: Mucous membranes are moist.     Pharynx: Oropharynx is clear.  Eyes:     General: No scleral icterus.       Right eye: No discharge.        Left eye: No discharge.     Extraocular Movements: Extraocular movements intact.     Conjunctiva/sclera: Conjunctivae normal.     Pupils: Pupils are equal, round, and reactive  to light.  Cardiovascular:     Rate and Rhythm: Normal rate and regular rhythm.     Pulses: Normal pulses.     Heart sounds: Normal heart sounds. No murmur. No friction rub. No gallop.   Pulmonary:     Effort: Pulmonary effort is normal. No respiratory distress.     Breath sounds: Normal breath sounds. No stridor. No wheezing, rhonchi or rales.  Chest:     Chest wall: No tenderness.  Musculoskeletal:        General: Normal range of motion.     Cervical back: Normal range of motion and neck supple.  Skin:    General: Skin is warm and dry.     Capillary Refill: Capillary refill takes less than 2 seconds.     Coloration: Skin is pale. Skin is not jaundiced.     Findings: No bruising, erythema, lesion or rash.  Neurological:     General: No focal deficit present.     Mental Status: He is alert and oriented to person, place, and time. Mental status is at baseline.  Psychiatric:        Mood and Affect: Mood  normal.        Behavior: Behavior normal.        Thought Content: Thought content normal.        Judgment: Judgment normal.     Results for orders placed or performed during the hospital encounter of 02/28/20  SARS Coronavirus 2 by RT PCR (hospital order, performed in [place] hospital lab) Nasopharyngeal Nasopharyngeal Swab   Specimen: Nasopharyngeal Swab  Result Value Ref Range   SARS Coronavirus 2 NEGATIVE NEGATIVE  Urine Culture   Specimen: Urine, Random  Result Value Ref Range   Specimen Description      URINE, RANDOM Performed at [facility], [address] [place], [place] [address]    Special Requests      NONE Performed at [facility], [address] [place], [place] [address]    Culture MULTIPLE SPECIES PRESENT, SUGGEST RECOLLECTION (A)    Report Status 02/29/2020 FINAL   Basic metabolic panel  Result Value Ref Range   Sodium 140 135 - 145 mmol/L   Potassium 3.4 (L) 3.5 - 5.1 mmol/L   Chloride 105 98 - 111 mmol/L   CO2 28 22 - 32 mmol/L   Glucose, Bld 163 (H) 70 - 99 mg/dL   BUN 6 (L) 8 - 23 mg/dL   Creatinine, Ser 0.80 0.61 - 1.24 mg/dL   Calcium 8.4 (L) 8.9 - 10.3 mg/dL   GFR calc non Af Amer >60 >60 mL/min   GFR calc Af Amer >60 >60 mL/min   Anion gap 7 5 - 15  CBC  Result Value Ref Range   WBC 1.6 (L) 4.0 - 10.5 K/uL   RBC 2.00 (L) 4.22 - 5.81 MIL/uL   Hemoglobin 6.6 (L) 13.0 - 17.0 g/dL   HCT 20.3 (L) 39.0 - 52.0 %   MCV 101.5 (H) 80.0 - 100.0 fL   MCH 33.0 26.0 - 34.0 pg   MCHC 32.5 30.0 - 36.0 g/dL   RDW 16.9 (H) 11.5 - 15.5 %   Platelets 74 (L) 150 - 400 K/uL   nRBC 0.0 0.0 - 0.2 %  Urinalysis, Complete w Microscopic  Result Value Ref Range   Color, Urine YELLOW (A) YELLOW   APPearance HAZY (A) CLEAR   Specific Gravity, Urine 1.009 1.005 - 1.030   pH 6.0 5.0 - 8.0  Glucose, UA NEGATIVE NEGATIVE mg/dL   Hgb urine dipstick NEGATIVE NEGATIVE   Bilirubin Urine NEGATIVE NEGATIVE   Ketones, ur NEGATIVE NEGATIVE mg/dL     Protein, ur 30 (A) NEGATIVE mg/dL   Nitrite NEGATIVE NEGATIVE   Leukocytes,Ua SMALL (A) NEGATIVE   RBC / HPF 0-5 0 - 5 RBC/hpf   WBC, UA 21-50 0 - 5 WBC/hpf   Bacteria, UA NONE SEEN NONE SEEN   Squamous Epithelial / LPF 0-5 0 - 5  Glucose, capillary  Result Value Ref Range   Glucose-Capillary 146 (H) 70 - 99 mg/dL  Protime-INR  Result Value Ref Range   Prothrombin Time 13.4 11.4 - 15.2 seconds   INR 1.1 0.8 - 1.2  Hepatic function panel  Result Value Ref Range   Total Protein 5.7 (L) 6.5 - 8.1 g/dL   Albumin 3.5 3.5 - 5.0 g/dL   AST 69 (H) 15 - 41 U/L   ALT 32 0 - 44 U/L   Alkaline Phosphatase 114 38 - 126 U/L   Total Bilirubin 0.9 0.3 - 1.2 mg/dL   Bilirubin, Direct 0.2 0.0 - 0.2 mg/dL   Indirect Bilirubin 0.7 0.3 - 0.9 mg/dL  Ethanol  Result Value Ref Range   Alcohol, Ethyl (B) <10 <10 mg/dL  CBC  Result Value Ref Range   WBC 1.7 (L) 4.0 - 10.5 K/uL   RBC 2.28 (L) 4.22 - 5.81 MIL/uL   Hemoglobin 7.4 (L) 13.0 - 17.0 g/dL   HCT 22.4 (L) 39.0 - 52.0 %   MCV 98.2 80.0 - 100.0 fL   MCH 32.5 26.0 - 34.0 pg   MCHC 33.0 30.0 - 36.0 g/dL   RDW 18.6 (H) 11.5 - 15.5 %   Platelets 71 (L) 150 - 400 K/uL   nRBC 0.0 0.0 - 0.2 %  Basic metabolic panel  Result Value Ref Range   Sodium 139 135 - 145 mmol/L   Potassium 3.8 3.5 - 5.1 mmol/L   Chloride 107 98 - 111 mmol/L   CO2 25 22 - 32 mmol/L   Glucose, Bld 127 (H) 70 - 99 mg/dL   BUN 6 (L) 8 - 23 mg/dL   Creatinine, Ser 0.69 0.61 - 1.24 mg/dL   Calcium 8.4 (L) 8.9 - 10.3 mg/dL   GFR calc non Af Amer >60 >60 mL/min   GFR calc Af Amer >60 >60 mL/min   Anion gap 7 5 - 15  Urine Drug Screen, Qualitative ([facility] only)  Result Value Ref Range   Tricyclic, Ur Screen POSITIVE (A) NONE DETECTED   Amphetamines, Ur Screen NONE DETECTED NONE DETECTED   MDMA (Ecstasy)Ur Screen NONE DETECTED NONE DETECTED   Cocaine Metabolite,Ur [place] NONE DETECTED NONE DETECTED   Opiate, Ur Screen NONE DETECTED NONE DETECTED   Phencyclidine (PCP) Ur S  NONE DETECTED NONE DETECTED   Cannabinoid 50 Ng, Ur [place] NONE DETECTED NONE DETECTED   Barbiturates, Ur Screen NONE DETECTED NONE DETECTED   Benzodiazepine, Ur Scrn NONE DETECTED NONE DETECTED   Methadone Scn, Ur NONE DETECTED NONE DETECTED  CBC with Differential/Platelet  Result Value Ref Range   WBC 1.5 (L) 4.0 - 10.5 K/uL   RBC 2.20 (L) 4.22 - 5.81 MIL/uL   Hemoglobin 7.1 (L) 13.0 - 17.0 g/dL   HCT 21.5 (L) 39.0 - 52.0 %   MCV 97.7 80.0 - 100.0 fL   MCH 32.3 26.0 - 34.0 pg   MCHC 33.0 30.0 - 36.0 g/dL   RDW 18.4 (H) 11.5 - 15.5 %  Platelets 64 (L) 150 - 400 K/uL   nRBC 0.0 0.0 - 0.2 %   Neutrophils Relative % 65 %   Neutro Abs 1.0 (L) 1.7 - 7.7 K/uL   Lymphocytes Relative 27 %   Lymphs Abs 0.4 (L) 0.7 - 4.0 K/uL   Monocytes Relative 5 %   Monocytes Absolute 0.1 0.1 - 1.0 K/uL   Eosinophils Relative 3 %   Eosinophils Absolute 0.0 0.0 - 0.5 K/uL   Basophils Relative 0 %   Basophils Absolute 0.0 0.0 - 0.1 K/uL   Immature Granulocytes 0 %   Abs Immature Granulocytes 0.00 0.00 - 0.07 K/uL  Protein electrophoresis, serum  Result Value Ref Range   Total Protein ELP 5.2 (L) 6.0 - 8.5 g/dL   Albumin ELP 3.2 2.9 - 4.4 g/dL   Alpha-1-Globulin 0.3 0.0 - 0.4 g/dL   Alpha-2-Globulin 0.5 0.4 - 1.0 g/dL   Beta Globulin 0.9 0.7 - 1.3 g/dL   Gamma Globulin 0.3 (L) 0.4 - 1.8 g/dL   M-Spike, % Not Observed Not Observed g/dL   SPE Interp. Comment    Comment Comment    Globulin, Total 2.0 (L) 2.2 - 3.9 g/dL   A/G Ratio 1.6 0.7 - 1.7  Kappa/lambda light chains  Result Value Ref Range   Kappa free light chain 2,288.7 (H) 3.3 - 19.4 mg/L   Lamda free light chains 3.0 (L) 5.7 - 26.3 mg/L   Kappa, lamda light chain ratio 762.90 (H) 0.26 - 1.65  IgG, IgA, IgM  Result Value Ref Range   IgG (Immunoglobin G), Serum 423 (L) 603 - 1,613 mg/dL   IgA 9 (L) 61 - 437 mg/dL   IgM (Immunoglobulin M), Srm 6 (L) 15 - 143 mg/dL  Type and screen [facility]  Result Value Ref Range    ABO/RH(D) A NEG    Antibody Screen NEG    Sample Expiration 03/02/2020,2359    Unit Number [id number]    Blood Component Type RED CELLS,LR    Unit division 00    Status of Unit ISSUED,FINAL    Transfusion Status OK TO TRANSFUSE    Crossmatch Result      Compatible Performed at [facility], [place], [place] [address]   Prepare RBC (crossmatch)  Result Value Ref Range   Order Confirmation      ORDER PROCESSED BY BLOOD BANK Performed at [facility], [address], [place], [place] [address]   [facility]  Result Value Ref Range   ISSUE DATE / TIME [id number]    Blood Product Unit Number [id number]    PRODUCT CODE [id number]    Unit Type and Rh 0600    Blood Product Expiration Date [id number]       Assessment & Plan:   Problem List Items Addressed This Visit      Other   Multiple myeloma ([place])    Not a candidate for treatment. He and his family have decided to pursue hospice. We will hold on blood work at this time, but will treat with oral iron and B12 injections to try to keep him comfortable. Continue to monitor. Call with any concerns.       Relevant Medications   meclizine (ANTIVERT) 25 MG tablet   Acute blood loss anemia    Not a candidate for treatment. He and his family have decided to pursue hospice. We will hold on blood work at this time, but will treat with oral iron and B12  injections to try to keep him comfortable. Continue to monitor. Call with any concerns.       Relevant Medications   Ferrous Sulfate (IRON) 325 (65 Fe) MG TABS   Hospice care patient - Primary    Hospice is coming out. Discussed blood work today- we will hold on it for now. Continue to monitor. Continue to work with hospice.          Follow up plan: Return in about 3 months (around 06/12/2020).

## 2020-03-16 ENCOUNTER — Encounter: Payer: Self-pay | Admitting: Family Medicine

## 2020-03-16 NOTE — Assessment & Plan Note (Signed)
Not a candidate for treatment. He and his family have decided to pursue hospice. We will hold on blood work at this time, but will treat with oral iron and B12 injections to try to keep him comfortable. Continue to monitor. Call with any concerns.

## 2020-03-16 NOTE — Assessment & Plan Note (Signed)
Hospice is coming out. Discussed blood work today- we will hold on it for now. Continue to monitor. Continue to work with hospice.

## 2020-03-24 ENCOUNTER — Other Ambulatory Visit (INDEPENDENT_AMBULATORY_CARE_PROVIDER_SITE_OTHER): Payer: Self-pay | Admitting: Vascular Surgery

## 2020-03-31 ENCOUNTER — Telehealth: Payer: Self-pay

## 2020-04-01 ENCOUNTER — Other Ambulatory Visit (INDEPENDENT_AMBULATORY_CARE_PROVIDER_SITE_OTHER): Payer: Self-pay | Admitting: Vascular Surgery

## 2020-05-01 ENCOUNTER — Ambulatory Visit (INDEPENDENT_AMBULATORY_CARE_PROVIDER_SITE_OTHER): Payer: Medicare Other | Admitting: Vascular Surgery

## 2020-05-01 ENCOUNTER — Encounter (INDEPENDENT_AMBULATORY_CARE_PROVIDER_SITE_OTHER): Payer: Medicare Other

## 2020-05-11 DEATH — deceased

## 2020-06-12 ENCOUNTER — Ambulatory Visit: Payer: Medicaid Other | Admitting: Family Medicine

## 2020-07-03 ENCOUNTER — Ambulatory Visit (INDEPENDENT_AMBULATORY_CARE_PROVIDER_SITE_OTHER): Payer: Medicare Other | Admitting: Vascular Surgery

## 2020-07-03 ENCOUNTER — Encounter (INDEPENDENT_AMBULATORY_CARE_PROVIDER_SITE_OTHER): Payer: Medicare Other
